# Patient Record
Sex: Female | Born: 1945 | Race: White | Hispanic: No | Marital: Married | Smoking: Former smoker
Health system: Southern US, Community
[De-identification: ages and names within clinical notes are randomized; demographics above are authoritative.]

## PROBLEM LIST (undated history)

## (undated) DIAGNOSIS — I10 Essential (primary) hypertension: Secondary | ICD-10-CM

## (undated) DIAGNOSIS — K449 Diaphragmatic hernia without obstruction or gangrene: Secondary | ICD-10-CM

## (undated) DIAGNOSIS — G4733 Obstructive sleep apnea (adult) (pediatric): Principal | ICD-10-CM

## (undated) DIAGNOSIS — M4800 Spinal stenosis, site unspecified: Secondary | ICD-10-CM

## (undated) DIAGNOSIS — Z8619 Personal history of other infectious and parasitic diseases: Secondary | ICD-10-CM

## (undated) DIAGNOSIS — F329 Major depressive disorder, single episode, unspecified: Secondary | ICD-10-CM

## (undated) DIAGNOSIS — F419 Anxiety disorder, unspecified: Secondary | ICD-10-CM

## (undated) DIAGNOSIS — M1990 Unspecified osteoarthritis, unspecified site: Secondary | ICD-10-CM

## (undated) DIAGNOSIS — K529 Noninfective gastroenteritis and colitis, unspecified: Secondary | ICD-10-CM

## (undated) DIAGNOSIS — K219 Gastro-esophageal reflux disease without esophagitis: Secondary | ICD-10-CM

## (undated) DIAGNOSIS — F32A Depression, unspecified: Secondary | ICD-10-CM

## (undated) DIAGNOSIS — G9340 Encephalopathy, unspecified: Secondary | ICD-10-CM

## (undated) DIAGNOSIS — N390 Urinary tract infection, site not specified: Secondary | ICD-10-CM

## (undated) DIAGNOSIS — R9439 Abnormal result of other cardiovascular function study: Secondary | ICD-10-CM

## (undated) DIAGNOSIS — A419 Sepsis, unspecified organism: Secondary | ICD-10-CM

## (undated) DIAGNOSIS — Z860100 Personal history of colon polyps, unspecified: Secondary | ICD-10-CM

## (undated) DIAGNOSIS — M797 Fibromyalgia: Secondary | ICD-10-CM

## (undated) DIAGNOSIS — R1900 Intra-abdominal and pelvic swelling, mass and lump, unspecified site: Secondary | ICD-10-CM

## (undated) DIAGNOSIS — E785 Hyperlipidemia, unspecified: Secondary | ICD-10-CM

## (undated) HISTORY — DX: Essential (primary) hypertension: I10

## (undated) HISTORY — DX: Personal history of colonic polyps: Z86.010

## (undated) HISTORY — DX: Hyperlipidemia, unspecified: E78.5

## (undated) HISTORY — DX: Major depressive disorder, single episode, unspecified: F32.9

## (undated) HISTORY — DX: Gastro-esophageal reflux disease without esophagitis: K21.9

## (undated) HISTORY — PX: RECTAL PROLAPSE REPAIR: SHX759

## (undated) HISTORY — DX: Encephalopathy, unspecified: G93.40

## (undated) HISTORY — DX: Noninfective gastroenteritis and colitis, unspecified: K52.9

## (undated) HISTORY — PX: BREAST BIOPSY: SHX20

## (undated) HISTORY — DX: Unspecified osteoarthritis, unspecified site: M19.90

## (undated) HISTORY — PX: ESOPHAGOGASTRODUODENOSCOPY: SHX1529

## (undated) HISTORY — DX: Intra-abdominal and pelvic swelling, mass and lump, unspecified site: R19.00

## (undated) HISTORY — DX: Personal history of colon polyps, unspecified: Z86.0100

## (undated) HISTORY — DX: Depression, unspecified: F32.A

## (undated) HISTORY — DX: Urinary tract infection, site not specified: N39.0

## (undated) HISTORY — DX: Diaphragmatic hernia without obstruction or gangrene: K44.9

## (undated) HISTORY — DX: Obstructive sleep apnea (adult) (pediatric): G47.33

## (undated) HISTORY — DX: Fibromyalgia: M79.7

## (undated) HISTORY — DX: Personal history of other infectious and parasitic diseases: Z86.19

## (undated) HISTORY — DX: Sepsis, unspecified organism: A41.9

## (undated) HISTORY — DX: Spinal stenosis, site unspecified: M48.00

## (undated) HISTORY — DX: Anxiety disorder, unspecified: F41.9

---

## 1981-02-10 HISTORY — PX: APPENDECTOMY: SHX54

## 1996-02-11 HISTORY — PX: POLYPECTOMY: SHX149

## 1997-06-13 ENCOUNTER — Ambulatory Visit (HOSPITAL_COMMUNITY): Admission: RE | Admit: 1997-06-13 | Discharge: 1997-06-13 | Payer: Self-pay | Admitting: Obstetrics and Gynecology

## 1998-06-27 ENCOUNTER — Ambulatory Visit (HOSPITAL_COMMUNITY): Admission: RE | Admit: 1998-06-27 | Discharge: 1998-06-27 | Payer: Self-pay | Admitting: Obstetrics and Gynecology

## 1998-07-13 ENCOUNTER — Encounter: Payer: Self-pay | Admitting: Gastroenterology

## 1998-07-13 ENCOUNTER — Ambulatory Visit (HOSPITAL_COMMUNITY): Admission: RE | Admit: 1998-07-13 | Discharge: 1998-07-13 | Payer: Self-pay | Admitting: Gastroenterology

## 1998-08-27 ENCOUNTER — Other Ambulatory Visit: Admission: RE | Admit: 1998-08-27 | Discharge: 1998-08-27 | Payer: Self-pay | Admitting: Gastroenterology

## 1998-10-25 ENCOUNTER — Encounter: Payer: Self-pay | Admitting: Gastroenterology

## 1998-10-25 ENCOUNTER — Ambulatory Visit (HOSPITAL_COMMUNITY): Admission: RE | Admit: 1998-10-25 | Discharge: 1998-10-25 | Payer: Self-pay | Admitting: Gastroenterology

## 1998-12-05 ENCOUNTER — Ambulatory Visit (HOSPITAL_COMMUNITY): Admission: RE | Admit: 1998-12-05 | Discharge: 1998-12-05 | Payer: Self-pay | Admitting: Obstetrics and Gynecology

## 1998-12-05 ENCOUNTER — Encounter: Payer: Self-pay | Admitting: Obstetrics and Gynecology

## 1999-01-25 ENCOUNTER — Encounter: Payer: Self-pay | Admitting: Obstetrics and Gynecology

## 1999-01-25 ENCOUNTER — Ambulatory Visit (HOSPITAL_COMMUNITY): Admission: RE | Admit: 1999-01-25 | Discharge: 1999-01-25 | Payer: Self-pay | Admitting: Obstetrics and Gynecology

## 1999-08-08 ENCOUNTER — Ambulatory Visit (HOSPITAL_COMMUNITY): Admission: RE | Admit: 1999-08-08 | Discharge: 1999-08-08 | Payer: Self-pay | Admitting: Obstetrics and Gynecology

## 1999-08-08 ENCOUNTER — Encounter: Payer: Self-pay | Admitting: Obstetrics and Gynecology

## 1999-08-19 ENCOUNTER — Encounter: Payer: Self-pay | Admitting: Obstetrics and Gynecology

## 1999-08-19 ENCOUNTER — Encounter: Admission: RE | Admit: 1999-08-19 | Discharge: 1999-08-19 | Payer: Self-pay | Admitting: Obstetrics and Gynecology

## 1999-10-23 ENCOUNTER — Other Ambulatory Visit: Admission: RE | Admit: 1999-10-23 | Discharge: 1999-10-23 | Payer: Self-pay | Admitting: Obstetrics and Gynecology

## 2000-02-27 ENCOUNTER — Other Ambulatory Visit: Admission: RE | Admit: 2000-02-27 | Discharge: 2000-02-27 | Payer: Self-pay | Admitting: Obstetrics and Gynecology

## 2001-06-21 ENCOUNTER — Ambulatory Visit (HOSPITAL_COMMUNITY): Admission: RE | Admit: 2001-06-21 | Discharge: 2001-06-21 | Payer: Self-pay | Admitting: Obstetrics and Gynecology

## 2001-06-21 ENCOUNTER — Encounter: Payer: Self-pay | Admitting: Obstetrics and Gynecology

## 2002-01-11 ENCOUNTER — Encounter: Admission: RE | Admit: 2002-01-11 | Discharge: 2002-03-15 | Payer: Self-pay | Admitting: Family Medicine

## 2002-06-10 ENCOUNTER — Encounter: Payer: Self-pay | Admitting: Radiology

## 2002-06-10 ENCOUNTER — Encounter: Admission: RE | Admit: 2002-06-10 | Discharge: 2002-06-10 | Payer: Self-pay | Admitting: Family Medicine

## 2002-06-10 ENCOUNTER — Encounter: Payer: Self-pay | Admitting: Family Medicine

## 2002-06-23 ENCOUNTER — Encounter: Payer: Self-pay | Admitting: Family Medicine

## 2002-06-23 ENCOUNTER — Encounter: Admission: RE | Admit: 2002-06-23 | Discharge: 2002-06-23 | Payer: Self-pay | Admitting: Family Medicine

## 2003-06-28 ENCOUNTER — Ambulatory Visit (HOSPITAL_COMMUNITY): Admission: RE | Admit: 2003-06-28 | Discharge: 2003-06-28 | Payer: Self-pay | Admitting: Internal Medicine

## 2004-02-11 HISTORY — PX: MEDIAL PARTIAL KNEE REPLACEMENT: SHX5965

## 2006-02-10 HISTORY — PX: OVARIAN CYST REMOVAL: SHX89

## 2007-02-11 LAB — HM COLONOSCOPY: HM Colonoscopy: NORMAL

## 2009-12-11 DIAGNOSIS — Z8619 Personal history of other infectious and parasitic diseases: Secondary | ICD-10-CM

## 2009-12-11 HISTORY — DX: Personal history of other infectious and parasitic diseases: Z86.19

## 2009-12-20 LAB — HM PAP SMEAR: HM Pap smear: NORMAL

## 2010-04-09 ENCOUNTER — Telehealth: Payer: Self-pay | Admitting: Internal Medicine

## 2010-04-09 NOTE — Telephone Encounter (Signed)
Pt has moved back in to town and is req to re-est with Dr Cato Mulligan as pcp. Pls advise. Pt does not have health insurance and will be self pay.

## 2010-04-09 NOTE — Telephone Encounter (Signed)
My apologies--- not accepting any new patients due to my schedule. We can get her in to see another physician

## 2010-04-10 NOTE — Telephone Encounter (Signed)
Lft pt vm stating that Dr Cato Mulligan declined her req to est with him as pcp and also told pt that we could offer her another pcp at [facility]. Waiting on call back.

## 2010-10-23 ENCOUNTER — Emergency Department (HOSPITAL_COMMUNITY)
Admission: EM | Admit: 2010-10-23 | Discharge: 2010-10-23 | Disposition: A | Discharge status: Home / Self Care | Payer: Self-pay | Attending: Emergency Medicine | Admitting: Emergency Medicine

## 2010-10-23 ENCOUNTER — Emergency Department (HOSPITAL_COMMUNITY): Payer: Self-pay

## 2010-10-23 DIAGNOSIS — IMO0002 Reserved for concepts with insufficient information to code with codable children: Secondary | ICD-10-CM | POA: Insufficient documentation

## 2010-10-23 DIAGNOSIS — Y92009 Unspecified place in unspecified non-institutional (private) residence as the place of occurrence of the external cause: Secondary | ICD-10-CM | POA: Insufficient documentation

## 2010-10-23 DIAGNOSIS — S43006A Unspecified dislocation of unspecified shoulder joint, initial encounter: Secondary | ICD-10-CM | POA: Insufficient documentation

## 2010-10-23 DIAGNOSIS — I10 Essential (primary) hypertension: Secondary | ICD-10-CM | POA: Insufficient documentation

## 2010-10-23 DIAGNOSIS — E119 Type 2 diabetes mellitus without complications: Secondary | ICD-10-CM | POA: Insufficient documentation

## 2010-11-27 ENCOUNTER — Ambulatory Visit (INDEPENDENT_AMBULATORY_CARE_PROVIDER_SITE_OTHER): Payer: Self-pay | Admitting: Family

## 2010-11-27 ENCOUNTER — Encounter: Payer: Self-pay | Admitting: Family

## 2010-11-27 VITALS — BP 140/78 | HR 48 | Temp 97.8°F | Resp 16

## 2010-11-27 DIAGNOSIS — E119 Type 2 diabetes mellitus without complications: Secondary | ICD-10-CM

## 2010-11-27 DIAGNOSIS — M797 Fibromyalgia: Secondary | ICD-10-CM | POA: Insufficient documentation

## 2010-11-27 DIAGNOSIS — K219 Gastro-esophageal reflux disease without esophagitis: Secondary | ICD-10-CM | POA: Insufficient documentation

## 2010-11-27 DIAGNOSIS — I493 Ventricular premature depolarization: Secondary | ICD-10-CM | POA: Insufficient documentation

## 2010-11-27 DIAGNOSIS — E785 Hyperlipidemia, unspecified: Secondary | ICD-10-CM

## 2010-11-27 DIAGNOSIS — G43909 Migraine, unspecified, not intractable, without status migrainosus: Secondary | ICD-10-CM | POA: Insufficient documentation

## 2010-11-27 DIAGNOSIS — F329 Major depressive disorder, single episode, unspecified: Secondary | ICD-10-CM

## 2010-11-27 DIAGNOSIS — E1165 Type 2 diabetes mellitus with hyperglycemia: Secondary | ICD-10-CM | POA: Insufficient documentation

## 2010-11-27 DIAGNOSIS — IMO0001 Reserved for inherently not codable concepts without codable children: Secondary | ICD-10-CM

## 2010-11-27 DIAGNOSIS — I10 Essential (primary) hypertension: Secondary | ICD-10-CM | POA: Insufficient documentation

## 2010-11-27 DIAGNOSIS — Z860100 Personal history of colon polyps, unspecified: Secondary | ICD-10-CM | POA: Insufficient documentation

## 2010-11-27 DIAGNOSIS — F32A Depression, unspecified: Secondary | ICD-10-CM

## 2010-11-27 DIAGNOSIS — I499 Cardiac arrhythmia, unspecified: Secondary | ICD-10-CM

## 2010-11-27 NOTE — Assessment & Plan Note (Signed)
Stable on Effexor, continue same.  

## 2010-11-27 NOTE — Assessment & Plan Note (Signed)
Reasonable BP control.

## 2010-11-27 NOTE — Patient Instructions (Signed)
Please follow up in 1 month for a Medicare Wellness Visit.

## 2010-11-27 NOTE — Progress Notes (Signed)
Subjective:    Patient ID: Kimberly Lin, female    DOB: April 01, 1945, 65 y.o.   MRN: [id number]  HPI  Kimberly Lin is a 65 yr old patient (used to see Dr. Cato Mulligan back in 2005), who presents today to restablish care.  She has been in [place] since that time and was receiving care under [facility]. She tells me that she was hospitalized last November with Urosepsis.  Arthritis- She reports that she used to be followed by pain management and on long acting narcotics, but she ultimately weaned herself off of the narcotics successfully.     HTN- takes lisinopril-HCTZ- she reports a bad cough.  Might be interested in switching medicines, but wants to wait until next month when she has her [employer].  Depression/anxiety- Reports that this is well controlled.  Uses xanax at bedtime to sleep.      Review of Systems  Constitutional: Negative for fever.       + weight gain  HENT: Negative for neck pain.   Eyes: Negative for visual disturbance.  Respiratory: Negative for chest tightness and shortness of breath.   Cardiovascular: Negative for chest pain.  Gastrointestinal: Negative for nausea and vomiting.  Genitourinary: Negative for urgency and frequency.  Musculoskeletal: Positive for arthralgias.  Skin: Negative for rash.  Neurological: Negative for headaches.  Hematological: Negative for adenopathy.  Psychiatric/Behavioral:       See HPI   Past Medical History  Diagnosis Date  . Arthritis     osteoarthritis  . Fibromyalgia   . History of chicken pox   . Depression   . Diabetes mellitus   . GERD (gastroesophageal reflux disease)   . Hyperlipidemia   . Hypertension   . Migraine   . Personal history of colonic polyps   . History of septic shock 12/2009    History   Social History  . Marital Status: Married    Spouse Name: N/A    Number of Children: 2  . Years of Education: N/A   Occupational History  . Not on file.   Social History Main Topics  .  Smoking status: Former Games developer  . Smokeless tobacco: Never Used   Comment: Smoked socially.  . Alcohol Use: 3.6 - 4.8 oz/week    6-8 Glasses of wine per week  . Drug Use: No  . Sexually Active: Not on file   Other Topics Concern  . Not on file   Social History Narrative   Regular exercise:  NoCaffeine use: 1 cup daily    Past Surgical History  Procedure Date  . Breast biopsy 2007, 2009    right breast  . Appendectomy 1983  . Joint replacement 2006    bilateral partial knee replacement  . Cystectomy 2008    Pt states she had a ?cyst removed ?ovaries    Family History  Problem Relation Age of Onset  . Cancer Mother     breast and lung  . Arthritis Mother   . Hyperlipidemia Mother   . Hypertension Mother   . Diabetes Mother   . Heart disease Mother   . Cancer Brother     lung, prostate  . Heart disease Brother     No Known Allergies  No current outpatient prescriptions on file prior to visit.    BP 140/78  Pulse 48  Temp(Src) 97.8 F (36.6 C) (Oral)  Resp 16       Objective:   Physical Exam  Constitutional: She appears well-developed and  well-nourished.  HENT:  Head: Normocephalic.  Cardiovascular: Normal rate.        Irregular rate/rythm  Pulmonary/Chest: Effort normal and breath sounds normal. No respiratory distress. She has no wheezes. She has no rales. She exhibits no tenderness.  Abdominal: Soft. Bowel sounds are normal.  Neurological: She is alert. Coordination normal.  Skin: Skin is warm and dry. No erythema.  Psychiatric: She has a normal mood and affect. Her behavior is normal. Judgment and thought content normal.          Assessment & Plan:  Pt wishes to defer further testing until her medicare becomes active in a few weeks.

## 2010-11-27 NOTE — Assessment & Plan Note (Signed)
EKG today notes NSR with frequent ectopic ventricular beats.  She is asymptomatic and tells me that this is not a new problem.

## 2010-11-29 ENCOUNTER — Telehealth: Payer: Self-pay | Admitting: *Deleted

## 2010-11-29 MED ORDER — LISINOPRIL-HYDROCHLOROTHIAZIDE 20-12.5 MG PO TABS: 2.0000 | ORAL_TABLET | Freq: Every day | ORAL | Status: DC

## 2010-11-29 NOTE — Telephone Encounter (Signed)
I sent refill on lisinopril-hct.  I would like to change her meds after she has insurance as the med I have in mind will be more expensive than what she is taking. We can discuss at her welcome to medicare visit.

## 2010-11-29 NOTE — Telephone Encounter (Signed)
Pt.notified

## 2010-11-29 NOTE — Telephone Encounter (Signed)
Received message from pt stating she does not have any refills on lisinopril-hct. Pt states she is okay with changing medication if you feel that is best. Currently without insurance and would like rx sent to [facility]. Please advise.

## 2010-12-13 ENCOUNTER — Ambulatory Visit (HOSPITAL_BASED_OUTPATIENT_CLINIC_OR_DEPARTMENT_OTHER)
Admission: RE | Admit: 2010-12-13 | Discharge: 2010-12-13 | Disposition: A | Discharge status: Home / Self Care | Payer: Medicare Other | Source: Ambulatory Visit | Attending: Family | Admitting: Family

## 2010-12-13 ENCOUNTER — Encounter: Payer: Self-pay | Admitting: Family

## 2010-12-13 ENCOUNTER — Other Ambulatory Visit: Payer: Self-pay | Admitting: Family

## 2010-12-13 ENCOUNTER — Ambulatory Visit (INDEPENDENT_AMBULATORY_CARE_PROVIDER_SITE_OTHER): Payer: Medicare Other | Admitting: Family

## 2010-12-13 ENCOUNTER — Telehealth: Payer: Self-pay | Admitting: *Deleted

## 2010-12-13 VITALS — BP 118/84 | HR 72 | Temp 97.8°F | Resp 16 | Ht 63.5 in | Wt 182.1 lb

## 2010-12-13 DIAGNOSIS — M25519 Pain in unspecified shoulder: Secondary | ICD-10-CM

## 2010-12-13 DIAGNOSIS — IMO0001 Reserved for inherently not codable concepts without codable children: Secondary | ICD-10-CM

## 2010-12-13 DIAGNOSIS — E785 Hyperlipidemia, unspecified: Secondary | ICD-10-CM

## 2010-12-13 DIAGNOSIS — Z1239 Encounter for other screening for malignant neoplasm of breast: Secondary | ICD-10-CM

## 2010-12-13 DIAGNOSIS — Z23 Encounter for immunization: Secondary | ICD-10-CM

## 2010-12-13 DIAGNOSIS — I499 Cardiac arrhythmia, unspecified: Secondary | ICD-10-CM

## 2010-12-13 DIAGNOSIS — Z860100 Personal history of colon polyps, unspecified: Secondary | ICD-10-CM

## 2010-12-13 DIAGNOSIS — Z1231 Encounter for screening mammogram for malignant neoplasm of breast: Secondary | ICD-10-CM | POA: Insufficient documentation

## 2010-12-13 DIAGNOSIS — I10 Essential (primary) hypertension: Secondary | ICD-10-CM

## 2010-12-13 DIAGNOSIS — M25511 Pain in right shoulder: Secondary | ICD-10-CM

## 2010-12-13 DIAGNOSIS — G43909 Migraine, unspecified, not intractable, without status migrainosus: Secondary | ICD-10-CM

## 2010-12-13 DIAGNOSIS — E119 Type 2 diabetes mellitus without complications: Secondary | ICD-10-CM

## 2010-12-13 DIAGNOSIS — Z13820 Encounter for screening for osteoporosis: Secondary | ICD-10-CM

## 2010-12-13 DIAGNOSIS — R002 Palpitations: Secondary | ICD-10-CM

## 2010-12-13 DIAGNOSIS — M797 Fibromyalgia: Secondary | ICD-10-CM

## 2010-12-13 LAB — HEPATIC FUNCTION PANEL
AST: 29 U/L (ref 0–37)
Bilirubin, Direct: 0.1 mg/dL (ref 0.0–0.3)
Indirect Bilirubin: 0.4 mg/dL (ref 0.0–0.9)
Total Bilirubin: 0.5 mg/dL (ref 0.3–1.2)

## 2010-12-13 LAB — TSH: TSH: 1.572 u[IU]/mL (ref 0.350–4.500)

## 2010-12-13 LAB — LIPID PANEL: Total CHOL/HDL Ratio: 6 Ratio

## 2010-12-13 LAB — BASIC METABOLIC PANEL
BUN: 24 mg/dL — ABNORMAL HIGH (ref 6–23)
CO2: 26 mEq/L (ref 19–32)
Calcium: 10.6 mg/dL — ABNORMAL HIGH (ref 8.4–10.5)
Creat: 1.21 mg/dL — ABNORMAL HIGH (ref 0.50–1.10)
Glucose, Bld: 109 mg/dL — ABNORMAL HIGH (ref 70–99)
Sodium: 133 mEq/L — ABNORMAL LOW (ref 135–145)

## 2010-12-13 MED ORDER — OLMESARTAN MEDOXOMIL 20 MG PO TABS: 20.0000 mg | ORAL_TABLET | Freq: Every day | ORAL | Status: DC

## 2010-12-13 MED ORDER — METFORMIN HCL 500 MG PO TABS: 1000.0000 mg | ORAL_TABLET | Freq: Two times a day (BID) | ORAL | Status: DC

## 2010-12-13 MED ORDER — DULOXETINE HCL 60 MG PO CPEP: 60.0000 mg | ORAL_CAPSULE | Freq: Every day | ORAL | Status: DC

## 2010-12-13 MED ORDER — METOPROLOL TARTRATE 50 MG PO TABS: 25.0000 mg | ORAL_TABLET | Freq: Two times a day (BID) | ORAL | Status: DC

## 2010-12-13 MED ORDER — PANTOPRAZOLE SODIUM 40 MG PO TBEC: 40.0000 mg | DELAYED_RELEASE_TABLET | Freq: Every day | ORAL | Status: DC

## 2010-12-13 NOTE — Assessment & Plan Note (Signed)
Pt tells me that GI recommended a 5 yr follow up colo in 2014.

## 2010-12-13 NOTE — Assessment & Plan Note (Signed)
Stable.  Monitor.  

## 2010-12-13 NOTE — Telephone Encounter (Signed)
Pt is requesting referral to cardiologist for her history of irregular heart rate. Pt states she has had some episodes since her last visit though HR is regular today. Please advise.

## 2010-12-13 NOTE — Assessment & Plan Note (Signed)
BP Readings from Last 3 Encounters:  12/13/10 118/84  11/27/10 140/78  BP looks good today.  But due to cough, will d/c lisinopril hctz and plan to place her on benicar instead. Continue beta blocker.

## 2010-12-13 NOTE — Telephone Encounter (Signed)
Left message on pt's voicemail and to call if any questions.

## 2010-12-13 NOTE — Telephone Encounter (Signed)
Lab order entered and forwarded to the lab.

## 2010-12-13 NOTE — Assessment & Plan Note (Signed)
Pt is requesting referral to cardiology for further evaluation as she reports increased palpitations recently.

## 2010-12-13 NOTE — Telephone Encounter (Signed)
Message copied by Kathi Simpers on Fri Dec 13, 2010  4:47 PM ------      Message from: O'SULLIVAN, [name]      Created: Fri Dec 13, 2010  1:49 PM       Could you pls ask lab to add on TSH to today's lab draw. Diagnosis is palpitations? thanks

## 2010-12-13 NOTE — Assessment & Plan Note (Signed)
Continue metformin, check a1c and urine microalbumin.  Change ACE to ARB due to cough.  Continue baby aspirin.

## 2010-12-13 NOTE — Assessment & Plan Note (Signed)
Will try changing effexor to cymbalta to see if this helps with her depression and fibromyalgia.

## 2010-12-13 NOTE — Patient Instructions (Addendum)
You will be contacted about your referral to Dr. Pearletha Forge- sports medicine about your shoulder.  Schedule your mammogram on the first floor. Schedule your bone density at the front desk. Stop lisinopril-hydrochlorothizide and effexor. Complete your lab work prior to leaving.  Follow up in 1 month.

## 2010-12-13 NOTE — Assessment & Plan Note (Signed)
Check FLP today. She reports hx of elevated LFT's in the past on a statin.

## 2010-12-13 NOTE — Telephone Encounter (Signed)
I will make arrangements.

## 2010-12-13 NOTE — Progress Notes (Signed)
Subjective:    Patient ID: Kimberly Lin, female    DOB: [birth year], 65 y.o.   MRN: [id number]  HPI  Subjective:   Patient here for Medicare annual wellness visit and management of other chronic and acute problems.  HTN-  She is currently on lisinopril-hydrochlorathiazide and lopressor.    Fibromyalgia- right dislocated shoulder is hurting her.  She has not seen orthopedics.    GERD- She tells me that she has been taking husband's protonix.  GERD has been well controlled.  Hyperlipidemia- not on medication. She reports that she had elevated LFT's previously on statin.   Migraine- controlled, denies recent migraine.    Colon Polyps-  Last colo was in 2009.  She had a polyp.  She was to told to repeat in 2014.  DM2- On metformin. She does not check her sugar at home.   Depression- "I am not happy."  She is upset re: housing arrangement.  She tells me that she has chronic issues of insomnia.  She has taken Ambien CR 12.5.  Some daytime sleepiness. Takes effexor.  S  Preventative-  Last pap smear was 11/11.  Her last mammogram was 11/11- Pneumovax last November.  Due for flu.  She would like to receive the zostavax.  She is due for bone density.   Risk factors:  At risk for heart disease due to diabetes.   Roster of Physicians Providing Medical Care to Patient: None  Activities of Daily Living  In your present state of health, do you have any difficulty performing the following activities? Preparing food and eating?: No  Bathing yourself: No  Getting dressed: No  Using the toilet:No  Moving around from place to place: No  In the past year have you fallen or had a near fall?: She did have a fall in the middle of the night- accidental fall.   Home Safety: Has smoke detector and wears seat belts. No firearms. No excess sun exposure.  Diet and Exercise  Current exercise habits: walks 3 times a week for 10-15 minutes.   Dietary issues discussed: healthy diet   Depression  Screen  (Note: if answer to either of the following is "Yes", then a more complete depression screening is indicated)  Q1: Over the past two weeks, have you felt down, depressed or hopeless?no  Q2: Over the past two weeks, have you felt little interest or pleasure in doing things? no   The following portions of the patient's history were reviewed and updated as appropriate: allergies, current medications, past family history, past medical history, past social history, past surgical history and problem list.   Objective:   Vision:see nursing Hearing: pt is able to hear a forced whisper at 6 feet Body mass index:see nursing Cognitive Impairment Assessment: cognition, memory and judgment appear normal.   Assessment:    Pt is due for flu shot and zostavax (given today) Also due for mammogram and dexa which have been ordered today. Plan pap smear next year.  Colo in 2014.   Plan:    During the course of the visit the patient was educated and counseled about appropriate screening and preventive services including:       Fall prevention   Screening mammography  Bone densitometry screening    Vaccines / LABS Zostavax / flu Vaccine  today  Patient Instructions (the written plan) was given to the patient.     Review of Systems  Constitutional: Negative for fever.  HENT: Negative for hearing loss.  Respiratory: Positive for cough. Negative for shortness of breath.   Cardiovascular: Negative for chest pain and leg swelling.  Gastrointestinal: Negative for nausea and blood in stool.  Genitourinary: Negative for flank pain.  Musculoskeletal: Positive for back pain.  Neurological: Negative for weakness.  Psychiatric/Behavioral:       See HPI       Objective:   Physical Exam  Constitutional: She appears well-developed and well-nourished.  HENT:  Head: Normocephalic and atraumatic.  Right Ear: Tympanic membrane and ear canal normal.  Left Ear: Tympanic membrane and ear canal  normal.  Mouth/Throat: Uvula is midline and mucous membranes are normal.  Eyes: Conjunctivae are normal. Pupils are equal, round, and reactive to light.  Neck: Normal range of motion. Neck supple. No thyromegaly present.  Cardiovascular: Normal rate and regular rhythm.   No murmur heard. Pulmonary/Chest: Effort normal and breath sounds normal. No respiratory distress. She has no wheezes. She has no rales. She exhibits no tenderness.  Abdominal: Soft. Bowel sounds are normal. She exhibits no distension and no mass. There is no tenderness. There is no rebound and no guarding.  Genitourinary:       Bilateral Breast exam is normal without palpable masses or thickening. No Axillary LAD is noted.   Musculoskeletal: Normal range of motion. She exhibits no edema.  Neurological: She is alert. No cranial nerve deficit.  Skin: Skin is warm and dry. No rash noted. No erythema. No pallor.  Psychiatric: She has a normal mood and affect. Her behavior is normal. Judgment and thought content normal.          Assessment & Plan:

## 2010-12-16 ENCOUNTER — Ambulatory Visit (INDEPENDENT_AMBULATORY_CARE_PROVIDER_SITE_OTHER)
Admission: RE | Admit: 2010-12-16 | Discharge: 2010-12-16 | Disposition: A | Discharge status: Home / Self Care | Payer: Medicare Other | Source: Ambulatory Visit

## 2010-12-16 DIAGNOSIS — Z13820 Encounter for screening for osteoporosis: Secondary | ICD-10-CM

## 2010-12-17 ENCOUNTER — Ambulatory Visit (INDEPENDENT_AMBULATORY_CARE_PROVIDER_SITE_OTHER): Payer: Medicare Other | Admitting: Family Medicine

## 2010-12-17 ENCOUNTER — Ambulatory Visit (HOSPITAL_BASED_OUTPATIENT_CLINIC_OR_DEPARTMENT_OTHER)
Admission: RE | Admit: 2010-12-17 | Discharge: 2010-12-17 | Disposition: A | Discharge status: Home / Self Care | Payer: Medicare Other | Source: Ambulatory Visit | Attending: Family Medicine | Admitting: Family Medicine

## 2010-12-17 ENCOUNTER — Encounter: Payer: Self-pay | Admitting: Family Medicine

## 2010-12-17 ENCOUNTER — Telehealth: Payer: Self-pay | Admitting: Family

## 2010-12-17 VITALS — BP 166/95 | HR 74 | Temp 98.1°F | Ht 64.0 in | Wt 182.0 lb

## 2010-12-17 DIAGNOSIS — M25511 Pain in right shoulder: Secondary | ICD-10-CM

## 2010-12-17 DIAGNOSIS — M25519 Pain in unspecified shoulder: Secondary | ICD-10-CM

## 2010-12-17 MED ORDER — SIMVASTATIN 20 MG PO TABS: 20.0000 mg | ORAL_TABLET | Freq: Every day | ORAL | Status: DC

## 2010-12-17 MED ORDER — MELOXICAM 15 MG PO TABS: 15.0000 mg | ORAL_TABLET | Freq: Every day | ORAL | Status: DC

## 2010-12-17 MED ORDER — OXYCODONE-ACETAMINOPHEN 5-325 MG PO TABS: 1.0000 | ORAL_TABLET | Freq: Four times a day (QID) | ORAL | Status: DC | PRN

## 2010-12-17 NOTE — Assessment & Plan Note (Signed)
Right shoulder dislocation - added axillary radiographs which do not show a bony bankart.  Ultrasound reassuring as well as exam that she does not have a full thickness rotator cuff tear that would necessitate surgical intervention.  Will proceed with conservative care - start physical therapy, meloxicam, with percocet as needed for severe pain.  F/u in 6 weeks (4 weeks if not improving) - if not improving would consider MR arthrogram to assess for labral tear.  See instructions for further.

## 2010-12-17 NOTE — Progress Notes (Signed)
Subjective:    Patient ID: Kimberly Lin, female    DOB: [birth year], 65 y.o.   MRN: [id number]  PCP: Sandford Craze  HPI 65 yo F here for right shoulder pain.  Patient's issues with right shoulder started on 10/23/2010 when she got up in the middle of the night, slipped and fell forward hitting right shoulder on a chair - caused this to dislocate. Waited 3 hours to present to ED - had conscious sedation then reduced her shoulder dislocation. No evidence of bony bankart or hill sachs on radiographs performed (did not have axillary view done). She has improved but still has 2/10 pain at rest, cannot sleep on her right side as it will wake her up. Motion has returned but has pain especially with reaching and overhead activities. Is right handed. No numbness into arm. Has not tried PT for this. Was given oxycodone from ED back in September when this happened but not taking anything now for pain.  Past Medical History  Diagnosis Date  . Arthritis     osteoarthritis  . Fibromyalgia   . History of chicken pox   . Depression   . Diabetes mellitus   . GERD (gastroesophageal reflux disease)   . Hyperlipidemia   . Hypertension   . Migraine   . Personal history of colonic polyps   . History of septic shock 12/2009  . Spinal stenosis     Current Outpatient Prescriptions on File Prior to Visit  Medication Sig Dispense Refill  . ALPRAZolam (XANAX) 1 MG tablet Take 2 tablets by mouth as needed as needed.      Marland Kitchen aspirin 81 MG tablet Take 81 mg by mouth daily.        . Calcium Carbonate-Vitamin D (CALCIUM-D) 600-400 MG-UNIT TABS Take 1 tablet by mouth 2 (two) times daily.        . Cholecalciferol (D-3-5) 5000 UNITS capsule Take 5,000 Units by mouth daily.        Marland Kitchen co-enzyme Q-10 30 MG capsule Take 100 mg by mouth daily.        . DULoxetine (CYMBALTA) 60 MG capsule Take 1 capsule (60 mg total) by mouth daily.  30 capsule  2  . metFORMIN (GLUCOPHAGE) 500 MG tablet Take 2 tablets (1,000  mg total) by mouth 2 (two) times daily with a meal. Take 2 tablets by mouth twice a day.  120 tablet  3  . metoprolol (LOPRESSOR) 50 MG tablet Take 0.5 tablets (25 mg total) by mouth 2 (two) times daily. Take 1/2 tablet twice a day.  30 tablet  3  . Multiple Vitamin (MULTIVITAMIN) tablet Take 1 tablet by mouth daily.        Marland Kitchen olmesartan (BENICAR) 20 MG tablet Take 1 tablet (20 mg total) by mouth daily.  30 tablet  2  . pantoprazole (PROTONIX) 40 MG tablet Take 1 tablet (40 mg total) by mouth daily.  30 tablet  5    Past Surgical History  Procedure Date  . Breast biopsy 2007, 2009    right breast  . Appendectomy 1983  . Joint replacement 2006    bilateral partial knee replacement  . Cystectomy 2008    Pt states she had a ?cyst removed ?ovaries    No Known Allergies  History   Social History  . Marital Status: Married    Spouse Name: N/A    Number of Children: 2  . Years of Education: N/A   Occupational History  . Not on file.  Social History Main Topics  . Smoking status: Former Games developer  . Smokeless tobacco: Never Used   Comment: Smoked socially.  . Alcohol Use: 3.6 - 4.8 oz/week    6-8 Glasses of wine per week  . Drug Use: No  . Sexually Active: Not on file   Other Topics Concern  . Not on file   Social History Narrative   Regular exercise:  NoCaffeine use: 1 cup daily    Family History  Problem Relation Age of Onset  . Cancer Mother     breast and lung  . Arthritis Mother   . Hyperlipidemia Mother   . Hypertension Mother   . Diabetes Mother   . Heart disease Mother   . Cancer Brother     lung, prostate  . Heart disease Brother   . Hypertension Brother   . Hyperlipidemia Brother   . Diabetes Brother   . Sudden death Father   . Diabetes Sister   . Hyperlipidemia Sister   . Hypertension Sister   . Diabetes Maternal Aunt   . Heart attack Maternal Aunt   . Hyperlipidemia Maternal Aunt   . Hypertension Maternal Aunt   . Hypertension Maternal Uncle   .  Hyperlipidemia Maternal Uncle   . Heart attack Maternal Uncle   . Diabetes Maternal Uncle   . Diabetes Paternal Aunt   . Heart attack Paternal Aunt   . Hyperlipidemia Paternal Aunt   . Hypertension Paternal Aunt   . Hypertension Paternal Uncle   . Hyperlipidemia Paternal Uncle   . Heart attack Paternal Uncle   . Diabetes Paternal Uncle     BP 166/95  Pulse 74  Temp(Src) 98.1 F (36.7 C) (Oral)  Ht 5\' 4"  (1.626 m)  Wt 182 lb (82.555 kg)  BMI 31.24 kg/m2  Review of Systems See HPI above.    Objective:   Physical Exam Gen: NAD  R shoulder: No swelling, ecchymoses.  No gross deformity. Mild TTP at biceps tendon, AC joint. FROM with + painful arc.  No frozen shoulder. + hawkins and neers. Negative Speeds, Yergasons. Strength 4+/5 with Empty can and painful.  Strength 5/5 with resisted internal/external rotation, mild pain with resisted IR. Negative apprehension though with pain. O'briens causes pain with thumb up and down - equal with both. NV intact distally.  MSK u/s R shoulder: Biceps tendon intact on long and transverse views.  AC joint with moderate DJD.  Subscapularis appears normal without impingement.  Supraspinatus also intact on long and transverse views without partial tears or retraction - small calcifications in tendon and at insertion.    Assessment & Plan:  1. Right shoulder dislocation - added axillary radiographs which do not show a bony bankart.  Ultrasound reassuring as well as exam that she does not have a full thickness rotator cuff tear that would necessitate surgical intervention.  Will proceed with conservative care - start physical therapy, meloxicam, with percocet as needed for severe pain.  F/u in 6 weeks (4 weeks if not improving) - if not improving would consider MR arthrogram to assess for labral tear.  See instructions for further.

## 2010-12-17 NOTE — Telephone Encounter (Signed)
Please call pt and let her know that her calcium is high.  I would like for her to cut back her calcium supplement to once daily.  Cholesterol is high.  I would like her to add livalo  once daily (this is a new statin which is well tolerated) We will keep a close eye on her liver function.  She should keep her follow up apt in December. Please leave her samples if we have them.

## 2010-12-17 NOTE — Patient Instructions (Signed)
We will move forward with physical therapy for the next 6 weeks to work on decreasing your pain and increasing strength in your right shoulder. If over the next 4 weeks you are not improving, follow up with me for a recheck - we would proceed with MRI arthrogram as a next step to assess for labral and rotator cuff tears. A cortisone injection may be beneficial depending on what the MRI shows. Icing 15 minutes at a time 3-4 times a day (or heat if this helps more with your pain). Meloxicam 15mg  daily with food for pain and inflammation. Try to avoid painful activities (overhead activities, lifting with extended arm) as much as possible. Follow up with me in 6 weeks for a recheck.

## 2010-12-18 ENCOUNTER — Telehealth: Payer: Self-pay | Admitting: Family

## 2010-12-18 ENCOUNTER — Encounter: Payer: Self-pay | Admitting: Family

## 2010-12-18 ENCOUNTER — Telehealth: Payer: Self-pay | Admitting: *Deleted

## 2010-12-18 ENCOUNTER — Ambulatory Visit (INDEPENDENT_AMBULATORY_CARE_PROVIDER_SITE_OTHER): Payer: Medicare Other | Admitting: Family

## 2010-12-18 DIAGNOSIS — I10 Essential (primary) hypertension: Secondary | ICD-10-CM

## 2010-12-18 DIAGNOSIS — R42 Dizziness and giddiness: Secondary | ICD-10-CM

## 2010-12-18 MED ORDER — LOSARTAN POTASSIUM 25 MG PO TABS: 25.0000 mg | ORAL_TABLET | Freq: Every day | ORAL | Status: DC

## 2010-12-18 NOTE — Assessment & Plan Note (Signed)
?   Due to benicar, ? Due to sinus congestion.  Trial of claritin. Will see how she does with switch to losartan.

## 2010-12-18 NOTE — Patient Instructions (Addendum)
Start claritin.  Call if your dizziness worsens or if it does not improve.   Follow up in December as scheduled.

## 2010-12-18 NOTE — Progress Notes (Signed)
Subjective:    Patient ID: Kimberly Lin, female    DOB: 03-28-1945, 65 y.o.   MRN: [id number]  HPI  Kimberly Lin is a 65 yr old female who presents today to discuss her blood pressure.  She reports elevated BP's at home as above. Last visit she was switch to benicar from her lisinopril due to ace cough.  Notes that she has been feeling dizzy.  Happens any time during the day.   Has "eased up a little bit."  Cough is still present but improved.  She is schedule to see cardiology re: hx of irregular HR. Repeat BP today in the office is 132/82  LE edema- worse during the day.  This AM her toes felt swollen, but have decreased during the day.    Hyperlipidemia- to start livalo.    Review of Systems See HPI  Past Medical History  Diagnosis Date  . Arthritis     osteoarthritis  . Fibromyalgia   . History of chicken pox   . Depression   . Diabetes mellitus   . GERD (gastroesophageal reflux disease)   . Hyperlipidemia   . Hypertension   . Migraine   . Personal history of colonic polyps   . History of septic shock 12/2009  . Spinal stenosis     History   Social History  . Marital Status: Married    Spouse Name: N/A    Number of Children: 2  . Years of Education: N/A   Occupational History  . Not on file.   Social History Main Topics  . Smoking status: Former Games developer  . Smokeless tobacco: Never Used   Comment: Smoked socially.  . Alcohol Use: 3.6 - 4.8 oz/week    6-8 Glasses of wine per week  . Drug Use: No  . Sexually Active: Not on file   Other Topics Concern  . Not on file   Social History Narrative   Regular exercise:  NoCaffeine use: 1 cup daily    Past Surgical History  Procedure Date  . Breast biopsy 2007, 2009    right breast  . Appendectomy 1983  . Joint replacement 2006    bilateral partial knee replacement  . Cystectomy 2008    Pt states she had a ?cyst removed ?ovaries    Family History  Problem Relation Age of Onset  . Cancer Mother    breast and lung  . Arthritis Mother   . Hyperlipidemia Mother   . Hypertension Mother   . Diabetes Mother   . Heart disease Mother   . Cancer Brother     lung, prostate  . Heart disease Brother   . Hypertension Brother   . Hyperlipidemia Brother   . Diabetes Brother   . Sudden death Father   . Diabetes Sister   . Hyperlipidemia Sister   . Hypertension Sister   . Diabetes Maternal Aunt   . Heart attack Maternal Aunt   . Hyperlipidemia Maternal Aunt   . Hypertension Maternal Aunt   . Hypertension Maternal Uncle   . Hyperlipidemia Maternal Uncle   . Heart attack Maternal Uncle   . Diabetes Maternal Uncle   . Diabetes Paternal Aunt   . Heart attack Paternal Aunt   . Hyperlipidemia Paternal Aunt   . Hypertension Paternal Aunt   . Hypertension Paternal Uncle   . Hyperlipidemia Paternal Uncle   . Heart attack Paternal Uncle   . Diabetes Paternal Uncle     No Known Allergies  Current Outpatient  Prescriptions on File Prior to Visit  Medication Sig Dispense Refill  . ALPRAZolam (XANAX) 1 MG tablet Take 2 tablets by mouth as needed as needed.      Marland Kitchen aspirin 81 MG tablet Take 81 mg by mouth daily.        . Calcium Carbonate-Vitamin D (CALCIUM-D) 600-400 MG-UNIT TABS Take 1 tablet by mouth daily.       . Cholecalciferol (D-3-5) 5000 UNITS capsule Take 5,000 Units by mouth daily.        Marland Kitchen co-enzyme Q-10 30 MG capsule Take 100 mg by mouth daily.        . metFORMIN (GLUCOPHAGE) 500 MG tablet Take 2 tablets (1,000 mg total) by mouth 2 (two) times daily with a meal. Take 2 tablets by mouth twice a day.  120 tablet  3  . metoprolol (LOPRESSOR) 50 MG tablet Take 0.5 tablets (25 mg total) by mouth 2 (two) times daily. Take 1/2 tablet twice a day.  30 tablet  3  . Multiple Vitamin (MULTIVITAMIN) tablet Take 1 tablet by mouth daily.        . pantoprazole (PROTONIX) 40 MG tablet Take 1 tablet (40 mg total) by mouth daily.  30 tablet  5  . DULoxetine (CYMBALTA) 60 MG capsule Take 1 capsule (60  mg total) by mouth daily.  30 capsule  2  . meloxicam (MOBIC) 15 MG tablet Take 1 tablet (15 mg total) by mouth daily. With food.  30 tablet  1  . oxyCODONE-acetaminophen (ROXICET) 5-325 MG per tablet Take 1 tablet by mouth every 6 (six) hours as needed for pain.  60 tablet  0  . Pitavastatin Calcium (LIVALO) 1 MG TABS Take 1 tablet by mouth daily.          BP 110/78  Pulse 78  Temp(Src) 98 F (36.7 C) (Oral)  Resp 16  Ht 5' 3.5" (1.613 m)  Wt 186 lb 0.6 oz (84.387 kg)  BMI 32.44 kg/m2       Objective:   Physical Exam  Constitutional: She appears well-developed and well-nourished.  Cardiovascular: Normal rate and regular rhythm.   No murmur heard. Pulmonary/Chest: Effort normal and breath sounds normal. No respiratory distress. She has no wheezes. She has no rales.  Musculoskeletal: She exhibits no edema.  Psychiatric: She has a normal mood and affect. Her behavior is normal. Judgment and thought content normal.          Assessment & Plan:

## 2010-12-18 NOTE — Telephone Encounter (Signed)
Received fax from [facility] that Benicar is a non formulary medication (will only provide one time fill)  and request change to generic Cozaar (Losartan). Please advise.

## 2010-12-18 NOTE — Telephone Encounter (Signed)
Cancelled rx for simvastatin at pharmacy.

## 2010-12-18 NOTE — Assessment & Plan Note (Signed)
BP today looks great, ? Accuracy of home monitor.  Her insurance will only cover a 30 day supply of benicar.  They are recommending losartan as an alternative. Will switch.  She will follow up in 1 month- will need bmet at that time.

## 2010-12-18 NOTE — Telephone Encounter (Signed)
Notified pt, she states she will try the cholesterol samples but is wary as they have caused elevation of liver enzymes in the past. Will give pt samples of Livalo 2mg  (take 1/2 tablet daily) to try first. Pt states she has not started Cymbalta yet as she still has several Effexor that she wants to complete first.  Pt states she started Benicar on Saturday and still has cough. Also has had elevated BP 160-170s / 90-100s. Has had dizziness and feet swelling x 3 days. Advised pt of need to be seen in the office. Scheduled appt today for 3:15pm.

## 2010-12-20 ENCOUNTER — Telehealth: Payer: Self-pay | Admitting: Family

## 2010-12-20 NOTE — Telephone Encounter (Signed)
Left message for pt to return my call.  When she calls back pls let her know that that the radiologist wants to do some additional imaging of the left breast.  They should be contacting her about setting up this testing if they have not already.

## 2010-12-23 ENCOUNTER — Ambulatory Visit: Payer: Medicare Other | Attending: Family Medicine | Admitting: Physical Therapy

## 2010-12-23 ENCOUNTER — Other Ambulatory Visit: Payer: Self-pay | Admitting: Family

## 2010-12-23 DIAGNOSIS — R928 Other abnormal and inconclusive findings on diagnostic imaging of breast: Secondary | ICD-10-CM

## 2010-12-23 DIAGNOSIS — M25619 Stiffness of unspecified shoulder, not elsewhere classified: Secondary | ICD-10-CM | POA: Insufficient documentation

## 2010-12-23 DIAGNOSIS — IMO0001 Reserved for inherently not codable concepts without codable children: Secondary | ICD-10-CM | POA: Insufficient documentation

## 2010-12-23 DIAGNOSIS — M25519 Pain in unspecified shoulder: Secondary | ICD-10-CM | POA: Insufficient documentation

## 2010-12-23 NOTE — Telephone Encounter (Signed)
Pt notified and provided with [facility] to [facility] of the left breast.

## 2010-12-24 ENCOUNTER — Encounter: Payer: Self-pay | Admitting: Family

## 2010-12-25 ENCOUNTER — Ambulatory Visit: Payer: Medicare Other | Admitting: Physical Therapy

## 2010-12-25 ENCOUNTER — Ambulatory Visit (INDEPENDENT_AMBULATORY_CARE_PROVIDER_SITE_OTHER): Payer: Medicare Other | Admitting: Cardiovascular Disease

## 2010-12-25 ENCOUNTER — Encounter: Payer: Self-pay | Admitting: Cardiovascular Disease

## 2010-12-25 DIAGNOSIS — M797 Fibromyalgia: Secondary | ICD-10-CM

## 2010-12-25 DIAGNOSIS — E785 Hyperlipidemia, unspecified: Secondary | ICD-10-CM

## 2010-12-25 DIAGNOSIS — I4949 Other premature depolarization: Secondary | ICD-10-CM

## 2010-12-25 DIAGNOSIS — IMO0001 Reserved for inherently not codable concepts without codable children: Secondary | ICD-10-CM

## 2010-12-25 DIAGNOSIS — I493 Ventricular premature depolarization: Secondary | ICD-10-CM

## 2010-12-25 DIAGNOSIS — I10 Essential (primary) hypertension: Secondary | ICD-10-CM

## 2010-12-25 NOTE — Assessment & Plan Note (Signed)
Should she admits to not sleeping very well. She also may have sleep apnea. I think that she needs to have a formal sleep study.  I've asked her to exercise on a regular basis. I prefer that he exercise for one hour a day. She can start up by exercising a few minutes several times a day but I would like for her to build up to a continuous 1 hour of exercise. This will also help with her hypertriglyceridemia.

## 2010-12-25 NOTE — Assessment & Plan Note (Signed)
We'll have her continue with a good diet and exercise program. I've asked her to stay away from eating anything white, weak, or sleep. I told her triglyceride levels will improve dramatically if she lost some weight. I'll see her again on as-needed basis.

## 2010-12-25 NOTE — Patient Instructions (Addendum)
Your physician recommends that you schedule a follow-up appointment in: As needed.  Dr. Elease Hashimoto would like for you to begin a low carbohydrate diet and exercise regimen that includes exercise such as walking and avoiding foods that are white, wheat or sweet.

## 2010-12-25 NOTE — Assessment & Plan Note (Signed)
Her blood pressure is actually fairly well controlled. I think that she may have sleep apnea. She certainly does not sleep well at night. We talked about getting on a good diet and exercise program. All these issues will definitely help her blood pressure control. She'll continue with her current medications.

## 2010-12-25 NOTE — Assessment & Plan Note (Signed)
She has benign PVCs on ECG.  She is asymptomatic.   No further treatment needed.

## 2010-12-25 NOTE — Progress Notes (Signed)
Kimberly Lin Date of Birth  09/08/1945 [facility] [address]    [address] [place], [place]  [address] [phone]  Fax  [phone]  History of Present Illness:  Kimberly Lin is a 65 y.o. famale with a hx of hypertension, diabetes melliutus,  and hypercholesterolemia. She was recently seen for a general medical exam and was found to have premature ventricular contractions. We are asked to see her today for further evaluation of her PVCs.  She is basically asymptomatic. She cannot really even tell that she's having premature ventricular contractions. She denies any chest pain, shortness breath, syncope, or presyncope.  She was  also found to have a very high triglyceride level and high cholesterol level at her most recent physical.  Current Outpatient Prescriptions on File Prior to Visit  Medication Sig Dispense Refill  . ALPRAZolam (XANAX) 1 MG tablet Take 2 tablets by mouth as needed as needed.      Marland Kitchen aspirin 81 MG tablet Take 81 mg by mouth daily.        . Calcium Carbonate-Vitamin D (CALCIUM-D) 600-400 MG-UNIT TABS Take 1 tablet by mouth daily.       . Cholecalciferol (D-3-5) 5000 UNITS capsule Take 5,000 Units by mouth daily.        Marland Kitchen co-enzyme Q-10 30 MG capsule Take 100 mg by mouth daily.        . DULoxetine (CYMBALTA) 60 MG capsule Take 1 capsule (60 mg total) by mouth daily.  30 capsule  2  . loratadine (CLARITIN) 10 MG tablet Take 10 mg by mouth daily.        Marland Kitchen losartan (COZAAR) 25 MG tablet Take 1 tablet (25 mg total) by mouth daily.  30 tablet  1  . meloxicam (MOBIC) 15 MG tablet Take 1 tablet (15 mg total) by mouth daily. With food.  30 tablet  1  . metFORMIN (GLUCOPHAGE) 500 MG tablet Take 2 tablets (1,000 mg total) by mouth 2 (two) times daily with a meal. Take 2 tablets by mouth twice a day.  120 tablet  3  . metoprolol (LOPRESSOR) 50 MG tablet Take 0.5 tablets (25 mg total) by mouth 2 (two) times daily. Take 1/2 tablet twice a day.  30 tablet  3  . Multiple  Vitamin (MULTIVITAMIN) tablet Take 1 tablet by mouth daily.        Marland Kitchen oxyCODONE-acetaminophen (ROXICET) 5-325 MG per tablet Take 1 tablet by mouth every 6 (six) hours as needed for pain.  60 tablet  0  . pantoprazole (PROTONIX) 40 MG tablet Take 1 tablet (40 mg total) by mouth daily.  30 tablet  5  . Pitavastatin Calcium (LIVALO) 1 MG TABS Take 1 tablet by mouth daily.          No Known Allergies  Past Medical History  Diagnosis Date  . Arthritis     osteoarthritis  . Fibromyalgia   . History of chicken pox   . Depression   . Diabetes mellitus   . GERD (gastroesophageal reflux disease)   . Hyperlipidemia   . Hypertension   . Migraine   . Personal history of colonic polyps   . History of septic shock 12/2009  . Spinal stenosis     Past Surgical History  Procedure Date  . Breast biopsy 2007, 2009    right breast  . Appendectomy 1983  . Joint replacement 2006    bilateral partial knee replacement  . Cystectomy 2008    Pt states she had  a ?cyst removed ?ovaries    History  Smoking status  . Former Smoker  Smokeless tobacco  . Never Used  Comment: Smoked socially.    History  Alcohol Use  . 3.6 - 4.8 oz/week  . 6-8 Glasses of wine per week    Family History  Problem Relation Age of Onset  . Cancer Mother     breast and lung  . Arthritis Mother   . Hyperlipidemia Mother   . Hypertension Mother   . Diabetes Mother   . Heart disease Mother   . Cancer Brother     lung, prostate  . Heart disease Brother   . Hypertension Brother   . Hyperlipidemia Brother   . Diabetes Brother   . Sudden death Father   . Diabetes Sister   . Hyperlipidemia Sister   . Hypertension Sister   . Diabetes Maternal Aunt   . Heart attack Maternal Aunt   . Hyperlipidemia Maternal Aunt   . Hypertension Maternal Aunt   . Hypertension Maternal Uncle   . Hyperlipidemia Maternal Uncle   . Heart attack Maternal Uncle   . Diabetes Maternal Uncle   . Diabetes Paternal Aunt   . Heart  attack Paternal Aunt   . Hyperlipidemia Paternal Aunt   . Hypertension Paternal Aunt   . Hypertension Paternal Uncle   . Hyperlipidemia Paternal Uncle   . Heart attack Paternal Uncle   . Diabetes Paternal Uncle     Reviw of Systems:  Reviewed in the HPI.  All other systems are negative.  Physical Exam: BP 143/84  Pulse 79  Ht 5' 3.5" (1.613 m)  Wt 188 lb 12.8 oz (85.639 kg)  BMI 32.92 kg/m2 The patient is alert and oriented x 3.  The mood and affect are normal.   Skin: warm and dry.  Color is normal.    HEENT:   [place]/AT normal carotids, no JVD  Lungs: clear   Heart: RR    Abdomen: + BS, normal   Extremities:  No c/c/e  Neuro:  Non focal    ECG: NSR, frequent PVCs.  Assessment / Plan:

## 2010-12-30 ENCOUNTER — Ambulatory Visit: Payer: Medicare Other | Admitting: Physical Therapy

## 2011-01-01 ENCOUNTER — Ambulatory Visit: Payer: Medicare Other | Admitting: Physical Therapy

## 2011-01-06 ENCOUNTER — Ambulatory Visit: Payer: Medicare Other | Admitting: Physical Therapy

## 2011-01-07 ENCOUNTER — Other Ambulatory Visit: Payer: Medicare Other

## 2011-01-10 ENCOUNTER — Ambulatory Visit: Payer: Medicare Other | Admitting: Physical Therapy

## 2011-01-14 ENCOUNTER — Ambulatory Visit: Payer: Medicare Other | Attending: Family Medicine | Admitting: Physical Therapy

## 2011-01-14 DIAGNOSIS — IMO0001 Reserved for inherently not codable concepts without codable children: Secondary | ICD-10-CM | POA: Insufficient documentation

## 2011-01-14 DIAGNOSIS — M25619 Stiffness of unspecified shoulder, not elsewhere classified: Secondary | ICD-10-CM | POA: Insufficient documentation

## 2011-01-14 DIAGNOSIS — M25519 Pain in unspecified shoulder: Secondary | ICD-10-CM | POA: Insufficient documentation

## 2011-01-15 ENCOUNTER — Encounter: Payer: Self-pay | Admitting: Family

## 2011-01-15 ENCOUNTER — Ambulatory Visit (INDEPENDENT_AMBULATORY_CARE_PROVIDER_SITE_OTHER): Payer: Medicare Other | Admitting: Family

## 2011-01-15 ENCOUNTER — Ambulatory Visit
Admission: RE | Admit: 2011-01-15 | Discharge: 2011-01-15 | Disposition: A | Discharge status: Home / Self Care | Payer: Medicare Other | Source: Ambulatory Visit | Attending: Family | Admitting: Family

## 2011-01-15 ENCOUNTER — Telehealth: Payer: Self-pay | Admitting: Family

## 2011-01-15 VITALS — BP 132/84 | HR 72 | Temp 98.1°F | Resp 16 | Ht 63.5 in | Wt 183.1 lb

## 2011-01-15 DIAGNOSIS — I4949 Other premature depolarization: Secondary | ICD-10-CM

## 2011-01-15 DIAGNOSIS — G4700 Insomnia, unspecified: Secondary | ICD-10-CM

## 2011-01-15 DIAGNOSIS — R928 Other abnormal and inconclusive findings on diagnostic imaging of breast: Secondary | ICD-10-CM

## 2011-01-15 DIAGNOSIS — I10 Essential (primary) hypertension: Secondary | ICD-10-CM

## 2011-01-15 DIAGNOSIS — I493 Ventricular premature depolarization: Secondary | ICD-10-CM

## 2011-01-15 DIAGNOSIS — R42 Dizziness and giddiness: Secondary | ICD-10-CM

## 2011-01-15 LAB — BASIC METABOLIC PANEL
BUN: 21 mg/dL (ref 6–23)
CO2: 23 mEq/L (ref 19–32)
Glucose, Bld: 158 mg/dL — ABNORMAL HIGH (ref 70–99)
Potassium: 4.6 mEq/L (ref 3.5–5.3)

## 2011-01-15 NOTE — Assessment & Plan Note (Signed)
I did have her fill out an [place] Sleepiness scale and she scored a zero.  Cardiology is concerned that she has OSA.  She is agreeable to a home sleep study.

## 2011-01-15 NOTE — Telephone Encounter (Signed)
Notified pt. She states she still has a 1 month supply of Cymbalta and is waiting on her insurance to get back with her regarding coverage. Pt states she will call us back and let us know how she wants to proceed.

## 2011-01-15 NOTE — Progress Notes (Signed)
Subjective:    Patient ID: Kimberly Lin, female    DOB: 06-13-1945, 65 y.o.   MRN: [id number]  HPI  Kimberly Lin is a 65 yr old female who presents today for follow up.  HTN-  She is currently on losartan and tolerating without difficulty. She reports that her dizziness is resolved. She is not taking claritin due to cost, but is using benadryl prn.   PVC's- she saw cardiology.  No further work up is planned at this time.  (Cardiology consult is reviewed)  Difficulty sleeping- she lays in bed at 10PM. If she takes xanax- takes 2 hrs to fall asleep, wakes up frequently.  Wakes between between 7A and 8A.  1 cup of coffee in the AM. She does not nap during the day.     Review of Systems See HPI  Past Medical History  Diagnosis Date  . Arthritis     osteoarthritis  . Fibromyalgia   . History of chicken pox   . Depression   . Diabetes mellitus   . GERD (gastroesophageal reflux disease)   . Hyperlipidemia   . Hypertension   . Migraine   . Personal history of colonic polyps   . History of septic shock 12/2009  . Spinal stenosis     History   Social History  . Marital Status: Married    Spouse Name: N/A    Number of Children: 2  . Years of Education: N/A   Occupational History  . Not on file.   Social History Main Topics  . Smoking status: Former Games developer  . Smokeless tobacco: Never Used   Comment: Smoked socially.  . Alcohol Use: 3.6 - 4.8 oz/week    6-8 Glasses of wine per week  . Drug Use: No  . Sexually Active: Not on file   Other Topics Concern  . Not on file   Social History Narrative   Regular exercise:  NoCaffeine use: 1 cup daily    Past Surgical History  Procedure Date  . Breast biopsy 2007, 2009    right breast  . Appendectomy 1983  . Joint replacement 2006    bilateral partial knee replacement  . Cystectomy 2008    Pt states she had a ?cyst removed ?ovaries    Family History  Problem Relation Age of Onset  . Cancer Mother     breast and  lung  . Arthritis Mother   . Hyperlipidemia Mother   . Hypertension Mother   . Diabetes Mother   . Heart disease Mother   . Cancer Brother     lung, prostate  . Heart disease Brother   . Hypertension Brother   . Hyperlipidemia Brother   . Diabetes Brother   . Sudden death Father   . Diabetes Sister   . Hyperlipidemia Sister   . Hypertension Sister   . Diabetes Maternal Aunt   . Heart attack Maternal Aunt   . Hyperlipidemia Maternal Aunt   . Hypertension Maternal Aunt   . Hypertension Maternal Uncle   . Hyperlipidemia Maternal Uncle   . Heart attack Maternal Uncle   . Diabetes Maternal Uncle   . Diabetes Paternal Aunt   . Heart attack Paternal Aunt   . Hyperlipidemia Paternal Aunt   . Hypertension Paternal Aunt   . Hypertension Paternal Uncle   . Hyperlipidemia Paternal Uncle   . Heart attack Paternal Uncle   . Diabetes Paternal Uncle     No Known Allergies  Current Outpatient Prescriptions on  File Prior to Visit  Medication Sig Dispense Refill  . ALPRAZolam (XANAX) 1 MG tablet Take 2 tablets by mouth as needed as needed.      Marland Kitchen aspirin 81 MG tablet Take 81 mg by mouth daily.        . Calcium Carbonate-Vitamin D (CALCIUM-D) 600-400 MG-UNIT TABS Take 1 tablet by mouth daily.       . Cholecalciferol (D-3-5) 5000 UNITS capsule Take 5,000 Units by mouth daily.        Marland Kitchen co-enzyme Q-10 30 MG capsule Take 100 mg by mouth daily.        . DULoxetine (CYMBALTA) 60 MG capsule Take 1 capsule (60 mg total) by mouth daily.  30 capsule  2  . loratadine (CLARITIN) 10 MG tablet Take 10 mg by mouth daily.        Marland Kitchen losartan (COZAAR) 25 MG tablet Take 1 tablet (25 mg total) by mouth daily.  30 tablet  1  . meloxicam (MOBIC) 15 MG tablet Take 1 tablet (15 mg total) by mouth daily. With food.  30 tablet  1  . metFORMIN (GLUCOPHAGE) 500 MG tablet Take 2 tablets (1,000 mg total) by mouth 2 (two) times daily with a meal. Take 2 tablets by mouth twice a day.  120 tablet  3  . metoprolol  (LOPRESSOR) 50 MG tablet Take 0.5 tablets (25 mg total) by mouth 2 (two) times daily. Take 1/2 tablet twice a day.  30 tablet  3  . Multiple Vitamin (MULTIVITAMIN) tablet Take 1 tablet by mouth daily.        Marland Kitchen oxyCODONE-acetaminophen (ROXICET) 5-325 MG per tablet Take 1 tablet by mouth every 6 (six) hours as needed for pain.  60 tablet  0  . pantoprazole (PROTONIX) 40 MG tablet Take 1 tablet (40 mg total) by mouth daily.  30 tablet  5  . Pitavastatin Calcium (LIVALO) 1 MG TABS Take 1 tablet by mouth daily.          BP 132/84  Pulse 72  Temp(Src) 98.1 F (36.7 C) (Oral)  Resp 16  Ht 5' 3.5" (1.613 m)  Wt 183 lb 1.9 oz (83.063 kg)  BMI 31.93 kg/m2       Objective:   Physical Exam  Constitutional: She appears well-developed and well-nourished.  Cardiovascular: Normal rate and regular rhythm.   No murmur heard. Pulmonary/Chest: Effort normal and breath sounds normal. No respiratory distress. She has no wheezes. She has no rales. She exhibits no tenderness.  Musculoskeletal: She exhibits no edema.  Psychiatric: She has a normal mood and affect. Her behavior is normal. Judgment and thought content normal.          Assessment & Plan:

## 2011-01-15 NOTE — Telephone Encounter (Signed)
SHE HAS BEEN TAKING THE CYMBALTA FOR 3 DAYS.  SHE HAS STOPPED THE EFFEXOR.  SHE NEEDS YOU TO THINK OF SOMETHING OTHER THAN CYMBALTA AS IT COSTS HER $70 PLUS FOR 30 DAYS   SHE WILL TALK TO HER AGENT TODAY TO SEE IF SHE CAN FIND SOMEONE WHO WILL COVER THIS MED AT A LOWER PRICE

## 2011-01-15 NOTE — Telephone Encounter (Signed)
If she wants, we can stop cymbalta and I can put her back on effexor plus citalopram.  It will be cheaper, but may not be as effective for her fibromyalgia.

## 2011-01-15 NOTE — Assessment & Plan Note (Signed)
No further work up planned per cardiology.  

## 2011-01-15 NOTE — Assessment & Plan Note (Signed)
Resolved

## 2011-01-15 NOTE — Telephone Encounter (Signed)
Please advise 

## 2011-01-15 NOTE — Assessment & Plan Note (Signed)
BP is stable on Cozaar. Cont same, obtain BMET.

## 2011-01-15 NOTE — Patient Instructions (Addendum)
You will be contacted about your sleep study.   Please follow up in 3 months, sooner if problems or concerns.

## 2011-01-16 ENCOUNTER — Encounter: Payer: Medicare Other | Admitting: Physical Therapy

## 2011-01-17 ENCOUNTER — Encounter: Payer: Self-pay | Admitting: Family

## 2011-01-17 ENCOUNTER — Encounter: Payer: Self-pay | Admitting: Family Medicine

## 2011-01-17 ENCOUNTER — Ambulatory Visit (INDEPENDENT_AMBULATORY_CARE_PROVIDER_SITE_OTHER): Payer: Medicare Other | Admitting: Family Medicine

## 2011-01-17 VITALS — BP 172/82 | HR 68 | Temp 97.8°F | Ht 63.0 in | Wt 180.0 lb

## 2011-01-17 DIAGNOSIS — M25511 Pain in right shoulder: Secondary | ICD-10-CM

## 2011-01-17 DIAGNOSIS — M25519 Pain in unspecified shoulder: Secondary | ICD-10-CM

## 2011-01-17 NOTE — Assessment & Plan Note (Signed)
Following right shoulder dislocation 3 months ago - Patient continues to struggle and pain has not improved much (though strength better) from last OV despite PT, home exercises, mobic, hydrocodone.  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  Will call with results and how to proceed following this study.

## 2011-01-17 NOTE — Progress Notes (Addendum)
Subjective:    Patient ID: Kimberly Lin, female    DOB: [birth year], 65 y.o.   MRN: [id number]  PCP: Sandford Craze  HPI  65 yo F here for f/u right shoulder pain.  11/6: Patient's issues with right shoulder started on 10/23/2010 when she got up in the middle of the night, slipped and fell forward hitting right shoulder on a chair - caused this to dislocate. Waited 3 hours to present to ED - had conscious sedation then reduced her shoulder dislocation. No evidence of bony bankart or hill sachs on radiographs performed (did not have axillary view done). She has improved but still has 2/10 pain at rest, cannot sleep on her right side as it will wake her up. Motion has returned but has pain especially with reaching and overhead activities. Is right handed. No numbness into arm. Has not tried PT for this. Was given oxycodone from ED back in September when this happened but not taking anything now for pain.  12/7: Patient has been compliant with home exercises and physical therapy for shoulder. Taking meloxicam with hydrocodone as needed. Has not had another dislocation since last visit. Pain worse with activity. In PT has had only mild improvement in motion and strength. Still hurts to lie on this side and limiting her ability to do activities.  Past Medical History  Diagnosis Date  . Arthritis     osteoarthritis  . Fibromyalgia   . History of chicken pox   . Depression   . Diabetes mellitus   . GERD (gastroesophageal reflux disease)   . Hyperlipidemia   . Hypertension   . Migraine   . Personal history of colonic polyps   . History of septic shock 12/2009  . Spinal stenosis     Current Outpatient Prescriptions on File Prior to Visit  Medication Sig Dispense Refill  . ALPRAZolam (XANAX) 1 MG tablet Take 2 tablets by mouth as needed as needed.      Marland Kitchen aspirin 81 MG tablet Take 81 mg by mouth daily.        . Calcium Carbonate-Vitamin D (CALCIUM-D) 600-400 MG-UNIT TABS  Take 1 tablet by mouth daily.       . Cholecalciferol (D-3-5) 5000 UNITS capsule Take 5,000 Units by mouth daily.        Marland Kitchen co-enzyme Q-10 30 MG capsule Take 100 mg by mouth daily.        . DULoxetine (CYMBALTA) 60 MG capsule Take 1 capsule (60 mg total) by mouth daily.  30 capsule  2  . loratadine (CLARITIN) 10 MG tablet Take 10 mg by mouth daily.        Marland Kitchen losartan (COZAAR) 25 MG tablet Take 1 tablet (25 mg total) by mouth daily.  30 tablet  1  . meloxicam (MOBIC) 15 MG tablet Take 1 tablet (15 mg total) by mouth daily. With food.  30 tablet  1  . metFORMIN (GLUCOPHAGE) 500 MG tablet Take 2 tablets (1,000 mg total) by mouth 2 (two) times daily with a meal. Take 2 tablets by mouth twice a day.  120 tablet  3  . metoprolol (LOPRESSOR) 50 MG tablet Take 0.5 tablets (25 mg total) by mouth 2 (two) times daily. Take 1/2 tablet twice a day.  30 tablet  3  . Multiple Vitamin (MULTIVITAMIN) tablet Take 1 tablet by mouth daily.        Marland Kitchen oxyCODONE-acetaminophen (ROXICET) 5-325 MG per tablet Take 1 tablet by mouth every 6 (six) hours as needed  for pain.  60 tablet  0  . pantoprazole (PROTONIX) 40 MG tablet Take 1 tablet (40 mg total) by mouth daily.  30 tablet  5  . Pitavastatin Calcium (LIVALO) 1 MG TABS Take 1 tablet by mouth daily.          Past Surgical History  Procedure Date  . Breast biopsy 2007, 2009    right breast  . Appendectomy 1983  . Joint replacement 2006    bilateral partial knee replacement  . Cystectomy 2008    Pt states she had a ?cyst removed ?ovaries    No Known Allergies  History   Social History  . Marital Status: Married    Spouse Name: N/A    Number of Children: 2  . Years of Education: N/A   Occupational History  . Not on file.   Social History Main Topics  . Smoking status: Former Games developer  . Smokeless tobacco: Never Used   Comment: Smoked socially.  . Alcohol Use: 3.6 - 4.8 oz/week    6-8 Glasses of wine per week  . Drug Use: No  . Sexually Active: Not on  file   Other Topics Concern  . Not on file   Social History Narrative   Regular exercise:  NoCaffeine use: 1 cup daily    Family History  Problem Relation Age of Onset  . Cancer Mother     breast and lung  . Arthritis Mother   . Hyperlipidemia Mother   . Hypertension Mother   . Diabetes Mother   . Heart disease Mother   . Cancer Brother     lung, prostate  . Heart disease Brother   . Hypertension Brother   . Hyperlipidemia Brother   . Diabetes Brother   . Sudden death Father   . Diabetes Sister   . Hyperlipidemia Sister   . Hypertension Sister   . Diabetes Maternal Aunt   . Heart attack Maternal Aunt   . Hyperlipidemia Maternal Aunt   . Hypertension Maternal Aunt   . Hypertension Maternal Uncle   . Hyperlipidemia Maternal Uncle   . Heart attack Maternal Uncle   . Diabetes Maternal Uncle   . Diabetes Paternal Aunt   . Heart attack Paternal Aunt   . Hyperlipidemia Paternal Aunt   . Hypertension Paternal Aunt   . Hypertension Paternal Uncle   . Hyperlipidemia Paternal Uncle   . Heart attack Paternal Uncle   . Diabetes Paternal Uncle     BP 172/82  Pulse 68  Temp(Src) 97.8 F (36.6 C) (Oral)  Ht 5\' 3"  (1.6 m)  Wt 180 lb (81.647 kg)  BMI 31.89 kg/m2  Review of Systems  See HPI above.    Objective:   Physical Exam  Gen: NAD  R shoulder: No swelling, ecchymoses.  No gross deformity. Mild TTP at biceps tendon, AC joint. FROM with + painful arc.  No frozen shoulder. + hawkins and neers. Strength 5-/5 with Empty can and painful.  Strength 5/5 with resisted internal/external rotation without pain. Positive apprehension and O'briens. NV intact distally.     Assessment & Plan:  1. Right shoulder dislocation - Patient continues to struggle and pain has not improved much (though strength better) from last OV despite PT, home exercises, mobic, hydrocodone.  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   Will call with results and how to proceed following this study.  Addendum: MR arthrogram reviewed and discussed with patient.  Surprisingly no evidence of labral tear on MR.  Does have possible partial thickness supraspinatus tear.  Her strength improvements and initial ultrasound would suggest if present this is not full thickness and should improve with further PT, possible cortisone injection.  I advised she come back into the office and will repeat ultrasound to look better at supraspinatus then consider more PT + combination subacromial/glenohumeral injection - she will make appointment.

## 2011-01-21 ENCOUNTER — Other Ambulatory Visit (HOSPITAL_COMMUNITY): Payer: Medicare Other

## 2011-01-23 ENCOUNTER — Ambulatory Visit (HOSPITAL_COMMUNITY)
Admission: RE | Admit: 2011-01-23 | Discharge: 2011-01-23 | Disposition: A | Discharge status: Home / Self Care | Payer: Medicare Other | Source: Ambulatory Visit | Attending: Family Medicine | Admitting: Family Medicine

## 2011-01-23 ENCOUNTER — Other Ambulatory Visit: Payer: Self-pay | Admitting: *Deleted

## 2011-01-23 DIAGNOSIS — M25511 Pain in right shoulder: Secondary | ICD-10-CM

## 2011-01-23 DIAGNOSIS — M25519 Pain in unspecified shoulder: Secondary | ICD-10-CM | POA: Insufficient documentation

## 2011-01-23 MED ORDER — LIDOCAINE HCL 1 % IJ SOLN: 7.0000 mL | Freq: Once | INTRAMUSCULAR | Status: DC

## 2011-01-23 MED ORDER — PITAVASTATIN CALCIUM 1 MG PO TABS: ORAL_TABLET | ORAL | Status: DC

## 2011-01-23 MED ORDER — IOHEXOL 300 MG/ML  SOLN
10.0000 mL | Freq: Once | INTRAMUSCULAR | Status: AC | PRN
  Administered 2011-01-23: 10 mL via INTRA_ARTICULAR

## 2011-01-23 NOTE — Telephone Encounter (Signed)
Received message from pt requesting samples of Livalo and wanting to know if this medication is on her formulary. Advised pt she could pick up Livalo 2mg  and take 1/2 tablet daily to equal 1mg  dose. Instructed pt to contact her insurance re: formulary coverage and to let us know if we need to find another alternative. Pt voices understanding. Samples left at front desk for pick up.

## 2011-01-23 NOTE — Procedures (Signed)
Time-out procedure was performed. The procedure and potential complications (bleeding, infection, contrast reaction) were discussed with the patient. Informed written consent was obtained. An appropriate skin entrance site was determined. The site was marked, prepped and draped in typical sterile fashion, and infiltrated locally with 1% lidocaine. A 22 gauge spinal needle was advanced to the superomedial margin of the right humeral head under fluoroscopy. 10 ml of a mixture of 0.06 ml Multihance in 10 ml of Omnipaque-300 and 2 ml 1% lidocaine was then used to opacify the right shoulder capsule. There were no immediate complications.

## 2011-02-05 ENCOUNTER — Encounter: Payer: Self-pay | Admitting: Family Medicine

## 2011-02-05 ENCOUNTER — Ambulatory Visit (INDEPENDENT_AMBULATORY_CARE_PROVIDER_SITE_OTHER): Payer: BLUE CROSS/BLUE SHIELD | Admitting: Family Medicine

## 2011-02-05 VITALS — BP 166/90 | HR 69 | Temp 98.2°F | Ht 64.0 in | Wt 180.0 lb

## 2011-02-05 DIAGNOSIS — M25511 Pain in right shoulder: Secondary | ICD-10-CM

## 2011-02-05 DIAGNOSIS — M25519 Pain in unspecified shoulder: Secondary | ICD-10-CM

## 2011-02-05 NOTE — Progress Notes (Signed)
Subjective:    Patient ID: Kimberly Lin, female    DOB: Sep 23, 1945, 65 y.o.   MRN: [id number]  PCP: Sandford Craze  HPI  65 yo F here for f/u right shoulder pain.  11/6: Patient's issues with right shoulder started on 10/23/2010 when she got up in the middle of the night, slipped and fell forward hitting right shoulder on a chair - caused this to dislocate. Waited 3 hours to present to ED - had conscious sedation then reduced her shoulder dislocation. No evidence of bony bankart or hill sachs on radiographs performed (did not have axillary view done). She has improved but still has 2/10 pain at rest, cannot sleep on her right side as it will wake her up. Motion has returned but has pain especially with reaching and overhead activities. Is right handed. No numbness into arm. Has not tried PT for this. Was given oxycodone from ED back in September when this happened but not taking anything now for pain.  12/7: Patient has been compliant with home exercises and physical therapy for shoulder. Taking meloxicam with hydrocodone as needed. Has not had another dislocation since last visit. Pain worse with activity. In PT has had only mild improvement in motion and strength. Still hurts to lie on this side and limiting her ability to do activities.  12/26: Patient returned today for reevaluation, ultrasound of shoulder - MR arthrogram without evidence labral tear, question supraspinatus tear. She is some better. Pain comes and goes. Was held out of PT while waiting on MR arthrogram. Still waking at night though not as much.  Past Medical History  Diagnosis Date  . Arthritis     osteoarthritis  . Fibromyalgia   . History of chicken pox   . Depression   . Diabetes mellitus   . GERD (gastroesophageal reflux disease)   . Hyperlipidemia   . Hypertension   . Migraine   . Personal history of colonic polyps   . History of septic shock 12/2009  . Spinal stenosis     Current  Outpatient Prescriptions on File Prior to Visit  Medication Sig Dispense Refill  . ALPRAZolam (XANAX) 1 MG tablet Take 2 tablets by mouth as needed as needed.      Marland Kitchen aspirin 81 MG tablet Take 81 mg by mouth daily.        Marland Kitchen BENICAR 20 MG tablet       . Calcium Carbonate-Vitamin D (CALCIUM-D) 600-400 MG-UNIT TABS Take 1 tablet by mouth daily.       . Cholecalciferol (D-3-5) 5000 UNITS capsule Take 5,000 Units by mouth daily.        Marland Kitchen co-enzyme Q-10 30 MG capsule Take 100 mg by mouth daily.        . DULoxetine (CYMBALTA) 60 MG capsule Take 1 capsule (60 mg total) by mouth daily.  30 capsule  2  . loratadine (CLARITIN) 10 MG tablet Take 10 mg by mouth daily.        Marland Kitchen losartan (COZAAR) 25 MG tablet Take 1 tablet (25 mg total) by mouth daily.  30 tablet  1  . meloxicam (MOBIC) 15 MG tablet Take 1 tablet (15 mg total) by mouth daily. With food.  30 tablet  1  . metFORMIN (GLUCOPHAGE) 500 MG tablet Take 2 tablets (1,000 mg total) by mouth 2 (two) times daily with a meal. Take 2 tablets by mouth twice a day.  120 tablet  3  . metoprolol (LOPRESSOR) 50 MG tablet Take 0.5 tablets (  25 mg total) by mouth 2 (two) times daily. Take 1/2 tablet twice a day.  30 tablet  3  . Multiple Vitamin (MULTIVITAMIN) tablet Take 1 tablet by mouth daily.        Marland Kitchen oxyCODONE-acetaminophen (ROXICET) 5-325 MG per tablet Take 1 tablet by mouth every 6 (six) hours as needed for pain.  60 tablet  0  . pantoprazole (PROTONIX) 40 MG tablet Take 1 tablet (40 mg total) by mouth daily.  30 tablet  5  . Pitavastatin Calcium (LIVALO) 1 MG TABS Pt given 2mg  tablets and instructed to take 1/2 tablet daily. LOT [id number]  EXP 11/2012  14 tablet  0    Past Surgical History  Procedure Date  . Breast biopsy 2007, 2009    right breast  . Appendectomy 1983  . Joint replacement 2006    bilateral partial knee replacement  . Cystectomy 2008    Pt states she had a ?cyst removed ?ovaries    No Known Allergies  History   Social History  .  Marital Status: Married    Spouse Name: N/A    Number of Children: 2  . Years of Education: N/A   Occupational History  . Not on file.   Social History Main Topics  . Smoking status: Former Games developer  . Smokeless tobacco: Never Used   Comment: Smoked socially.  . Alcohol Use: 3.6 - 4.8 oz/week    6-8 Glasses of wine per week  . Drug Use: No  . Sexually Active: Not on file   Other Topics Concern  . Not on file   Social History Narrative   Regular exercise:  NoCaffeine use: 1 cup daily    Family History  Problem Relation Age of Onset  . Cancer Mother     breast and lung  . Arthritis Mother   . Hyperlipidemia Mother   . Hypertension Mother   . Diabetes Mother   . Heart disease Mother   . Cancer Brother     lung, prostate  . Heart disease Brother   . Hypertension Brother   . Hyperlipidemia Brother   . Diabetes Brother   . Sudden death Father   . Diabetes Sister   . Hyperlipidemia Sister   . Hypertension Sister   . Diabetes Maternal Aunt   . Heart attack Maternal Aunt   . Hyperlipidemia Maternal Aunt   . Hypertension Maternal Aunt   . Hypertension Maternal Uncle   . Hyperlipidemia Maternal Uncle   . Heart attack Maternal Uncle   . Diabetes Maternal Uncle   . Diabetes Paternal Aunt   . Heart attack Paternal Aunt   . Hyperlipidemia Paternal Aunt   . Hypertension Paternal Aunt   . Hypertension Paternal Uncle   . Hyperlipidemia Paternal Uncle   . Heart attack Paternal Uncle   . Diabetes Paternal Uncle     BP 166/90  Pulse 69  Temp(Src) 98.2 F (36.8 C) (Oral)  Ht 5\' 4"  (1.626 m)  Wt 180 lb (81.647 kg)  BMI 30.90 kg/m2  Review of Systems  See HPI above.    Objective:   Physical Exam  Gen: NAD  R shoulder: No swelling, ecchymoses.  No gross deformity. No TTP at biceps tendon, AC joint. FROM with + painful arc.  No frozen shoulder. + hawkins and neers. Strength 5-/5 with Empty can and painful.  Strength 5/5 with resisted internal/external rotation  without pain. Mildly positive apprehension and O'briens. NV intact distally.  MSK u/s R shoulder: Supraspinatus appears  intact with possible exception of very lateral insertion - calcification and some hypoechogenicity here but represents < 5% of insertion on footplate.  Images saved.    Assessment & Plan:  1. Right shoulder dislocation - MR arthrogram reassuring - no evidence of labral tear.  Possible partial thickness supraspinatus tear - if present is very small based on ultrasound, no evidence of retraction and mostly preserved strength.  Discussed options - patient would like to restart PT.  Will consider repeat cortisone injection, nitro patches in future if not improving.

## 2011-02-05 NOTE — Assessment & Plan Note (Signed)
Right shoulder dislocation - MR arthrogram reassuring - no evidence of labral tear.  Possible partial thickness supraspinatus tear - if present is very small based on ultrasound, no evidence of retraction and mostly preserved strength.  Discussed options - patient would like to restart PT.  Will consider repeat cortisone injection, nitro patches in future if not improving.

## 2011-02-10 MED ORDER — GADOBENATE DIMEGLUMINE 529 MG/ML IV SOLN
5.0000 mL | Freq: Once | INTRAVENOUS | Status: AC | PRN
  Administered 2011-02-10: 5 mL via INTRAVENOUS

## 2011-02-13 ENCOUNTER — Telehealth: Payer: Self-pay | Admitting: *Deleted

## 2011-02-13 NOTE — Telephone Encounter (Signed)
Received message from pt that she is running out of Cymbalta and it will not be covered by her ins. Pt is requesting formulary alternative or cheaper substitute. Left detailed message for pt to return my call and let us know if she checked her formulary for covered alternatives.

## 2011-02-14 NOTE — Telephone Encounter (Signed)
Pt called stating she could not pull the information up on the website and was unable to get information from customer service. She was told that the Benicar copay should be $35 so pt states she is going to continue both meds until she sees [name] again and will discuss further at that time.

## 2011-02-14 NOTE — Telephone Encounter (Signed)
Spoke with pt, she states Cymbalta will be $35 and she can continue that. She is requesting replacement for Benicar as it will cost $70 through her current plan. Pt will check her formulary and call me back with possible alternatives.

## 2011-02-19 ENCOUNTER — Telehealth: Payer: Self-pay | Admitting: *Deleted

## 2011-02-19 MED ORDER — PITAVASTATIN CALCIUM 1 MG PO TABS: 1.0000 mg | ORAL_TABLET | Freq: Every day | ORAL | Status: DC

## 2011-02-19 NOTE — Telephone Encounter (Signed)
Received message from pt requesting samples of Livalo. Attempted to reach pt and left detailed message that Livalo 2mg  (1 bx) has been placed at the front desk for pick and to call if any questions. Pt will need to take 1/2 tablet daily.

## 2011-02-20 ENCOUNTER — Encounter: Payer: Self-pay | Admitting: Family Medicine

## 2011-02-20 ENCOUNTER — Other Ambulatory Visit: Payer: Self-pay | Admitting: Family Medicine

## 2011-02-20 ENCOUNTER — Telehealth: Payer: Self-pay | Admitting: Family

## 2011-02-20 DIAGNOSIS — G478 Other sleep disorders: Secondary | ICD-10-CM

## 2011-02-20 DIAGNOSIS — M25511 Pain in right shoulder: Secondary | ICD-10-CM

## 2011-02-20 MED ORDER — OXYCODONE-ACETAMINOPHEN 5-325 MG PO TABS: 1.0000 | ORAL_TABLET | Freq: Four times a day (QID) | ORAL | Status: DC | PRN

## 2011-02-20 NOTE — Telephone Encounter (Signed)
This was ordered on 12/5, could you pls check with pulmonary as they should have received order and should contact pt directly.

## 2011-02-20 NOTE — Assessment & Plan Note (Signed)
Called for ? Injection vs refill on pain medication - will give 1 additional fill of percocet #60 q6h prn.

## 2011-02-20 NOTE — Telephone Encounter (Signed)
She would like to do the home sleep study

## 2011-02-20 NOTE — Telephone Encounter (Signed)
Please advise 

## 2011-02-21 ENCOUNTER — Telehealth: Payer: Self-pay

## 2011-02-21 NOTE — Telephone Encounter (Signed)
Spoke with pulmonology and they state Almyra Free will call me back re: status.

## 2011-02-21 NOTE — Telephone Encounter (Signed)
Dr's office in [place] dis a precert on this home npsg and pt has been called [facility] to set this up

## 2011-02-24 NOTE — Telephone Encounter (Signed)
Kimberly Lin states they had not received order and we will also need to verify if pre-cert is needed. Per Kimberly Lin, order has been sent and Kimberly Lin will contact pt.

## 2011-02-27 ENCOUNTER — Telehealth: Payer: Self-pay | Admitting: Family

## 2011-02-27 MED ORDER — LOSARTAN POTASSIUM 25 MG PO TABS: 25.0000 mg | ORAL_TABLET | Freq: Every day | ORAL | Status: DC

## 2011-02-27 NOTE — Telephone Encounter (Signed)
Refill sent to pharmacy for Cozaar #30 x 2 refills.

## 2011-03-11 ENCOUNTER — Telehealth: Payer: Self-pay | Admitting: *Deleted

## 2011-03-11 MED ORDER — PITAVASTATIN CALCIUM 1 MG PO TABS: 1.0000 mg | ORAL_TABLET | Freq: Every day | ORAL | Status: DC

## 2011-03-11 NOTE — Telephone Encounter (Signed)
Received call from pt requesting samples of Livalo and results of home sleep study. Advised pt that samples have been left at the front desk for pick up and we should be getting sleep study results by the end of the day and will call her once they have been reviewed. Per Almyra Free at pulmonology, we should get result today.

## 2011-03-12 ENCOUNTER — Telehealth: Payer: Self-pay | Admitting: Family

## 2011-03-12 ENCOUNTER — Encounter: Payer: Self-pay | Admitting: Family

## 2011-03-12 DIAGNOSIS — G4733 Obstructive sleep apnea (adult) (pediatric): Secondary | ICD-10-CM | POA: Insufficient documentation

## 2011-03-12 HISTORY — DX: Obstructive sleep apnea (adult) (pediatric): G47.33

## 2011-03-12 NOTE — Telephone Encounter (Signed)
Spoke with pt.  Reviewed results of sleep study- mild OSA.  She wishes to focus on weight loss at this time which is reasonable.

## 2011-03-12 NOTE — Telephone Encounter (Signed)
Left message for pt to return our call to discuss sleep study.

## 2011-03-13 NOTE — Telephone Encounter (Signed)
Result was received and discussed with pt yesterday. See 03/12/11 phone note.

## 2011-03-17 ENCOUNTER — Ambulatory Visit (INDEPENDENT_AMBULATORY_CARE_PROVIDER_SITE_OTHER): Payer: Medicare Other | Admitting: Pulmonary Disease

## 2011-03-17 DIAGNOSIS — G4700 Insomnia, unspecified: Secondary | ICD-10-CM

## 2011-03-17 DIAGNOSIS — G4733 Obstructive sleep apnea (adult) (pediatric): Secondary | ICD-10-CM

## 2011-03-25 ENCOUNTER — Telehealth: Payer: Self-pay | Admitting: *Deleted

## 2011-03-25 MED ORDER — ALPRAZOLAM 1 MG PO TABS: 1.0000 mg | ORAL_TABLET | Freq: Every evening | ORAL | Status: DC | PRN

## 2011-03-25 NOTE — Telephone Encounter (Signed)
Refill left on [facility] voicemail.

## 2011-03-25 NOTE — Telephone Encounter (Signed)
Received voice message from pt that she used to take Ambien 12.5mg  in the past to help her sleep. States she is not sleeping well and needs this called in to help her sleep. Attempted to reach pt and left message to return my call.

## 2011-03-25 NOTE — Telephone Encounter (Signed)
Notified pt, attempted to call rx to pharmacy and received busy signal.

## 2011-03-25 NOTE — Telephone Encounter (Signed)
Please let pt know that the recommended dose for pt's age 66 and over is 5mg .  Please  see pended rx below please.

## 2011-03-25 NOTE — Telephone Encounter (Signed)
Pt returned my call stating she would like samples of ?Ambien CR 12.5mg  (that she took in the past) or Rx of xanax. Pt states she has had trouble falling asleep for years. Pt states she has been taking Xanax 2 tablets at bedtime to help her fall asleep and has run out. Please advise.

## 2011-03-25 NOTE — Telephone Encounter (Signed)
We do not have ambien samples.  In place of Ambien, ok to call in Alprazolam 1mg  HS PRN #30, no refills.

## 2011-04-03 ENCOUNTER — Other Ambulatory Visit: Payer: Self-pay | Admitting: Family

## 2011-04-04 MED ORDER — PITAVASTATIN CALCIUM 1 MG PO TABS: 1.0000 mg | ORAL_TABLET | Freq: Every day | ORAL | Status: DC

## 2011-04-04 NOTE — Telephone Encounter (Signed)
Refill sent to pharmacy for Benicar #30 x 1 refill. Pt called requesting samples of Livalo and was notified samples are at front desk for pick up.

## 2011-04-15 ENCOUNTER — Ambulatory Visit (INDEPENDENT_AMBULATORY_CARE_PROVIDER_SITE_OTHER): Payer: Medicare Other | Admitting: Family

## 2011-04-15 ENCOUNTER — Encounter: Payer: Self-pay | Admitting: Family

## 2011-04-15 DIAGNOSIS — R0602 Shortness of breath: Secondary | ICD-10-CM | POA: Diagnosis not present

## 2011-04-15 DIAGNOSIS — E119 Type 2 diabetes mellitus without complications: Secondary | ICD-10-CM

## 2011-04-15 DIAGNOSIS — I10 Essential (primary) hypertension: Secondary | ICD-10-CM | POA: Diagnosis not present

## 2011-04-15 DIAGNOSIS — R0609 Other forms of dyspnea: Secondary | ICD-10-CM

## 2011-04-15 DIAGNOSIS — E785 Hyperlipidemia, unspecified: Secondary | ICD-10-CM

## 2011-04-15 DIAGNOSIS — G4700 Insomnia, unspecified: Secondary | ICD-10-CM

## 2011-04-15 DIAGNOSIS — R0600 Dyspnea, unspecified: Secondary | ICD-10-CM

## 2011-04-15 DIAGNOSIS — G4733 Obstructive sleep apnea (adult) (pediatric): Secondary | ICD-10-CM

## 2011-04-15 DIAGNOSIS — R0989 Other specified symptoms and signs involving the circulatory and respiratory systems: Secondary | ICD-10-CM

## 2011-04-15 LAB — HEPATIC FUNCTION PANEL
ALT: 25 U/L (ref 0–35)
Bilirubin, Direct: 0.1 mg/dL (ref 0.0–0.3)
Indirect Bilirubin: 0.4 mg/dL (ref 0.0–0.9)
Total Bilirubin: 0.5 mg/dL (ref 0.3–1.2)

## 2011-04-15 LAB — LIPID PANEL: Cholesterol: 200 mg/dL (ref 0–200)

## 2011-04-15 LAB — BASIC METABOLIC PANEL
BUN: 13 mg/dL (ref 6–23)
CO2: 25 mEq/L (ref 19–32)
Glucose, Bld: 131 mg/dL — ABNORMAL HIGH (ref 70–99)
Potassium: 4.3 mEq/L (ref 3.5–5.3)

## 2011-04-15 MED ORDER — PITAVASTATIN CALCIUM 1 MG PO TABS: 1.0000 mg | ORAL_TABLET | Freq: Every day | ORAL | Status: DC

## 2011-04-15 MED ORDER — ZOLPIDEM TARTRATE 5 MG PO TABS: ORAL_TABLET | ORAL | Status: DC

## 2011-04-15 NOTE — Patient Instructions (Signed)
You will be contacted about your referral for stress test.  Please complete your lab work prior to leaving today. Follow up in 2 months.

## 2011-04-15 NOTE — Assessment & Plan Note (Signed)
Pt is at risk for CAD due to multiple risk factors and strong family hx of CAD.  EKG performed today in the office is reviewed and compared to prior. TWI V1-V4.  Will refer for stress test. She denies chest pain or SOB at rest, however she is instructed to go to the ED if she develops chest pain/worsening shortness of breath and verbalizes understanding. EKG and cardiac plan was discussed with Dr. Rodena Medin.

## 2011-04-15 NOTE — Assessment & Plan Note (Signed)
BP Readings from Last 3 Encounters:  04/15/11 152/90  02/05/11 166/90  01/17/11 172/82  She reports that she did not take her BP med this AM.  Reinforced importance of compliance.

## 2011-04-15 NOTE — Progress Notes (Signed)
Subjective:    Patient ID: Kimberly Lin, female    DOB: 1946/01/15, 66 y.o.   MRN: [id number]  HPI  Kimberly Lin is a 66 yr old female who presents today for follow up.  HTN- She did not take her bp meds yet this AM.  Insomnia- reports that she has had 3 nights without "any" sleep.  She reports that [place] and xanax are not covered. She reports that she has tried benadryl at night.    She reports that she has used amitriptylline in the past- doen't want to try again.  She would like to try [place] again and will consider paying out of pocket.   Weight gain- reports that she has tried cutting out carbs, but has still gained about 5 pounds since last visit.  Not exercising.   Shortness of breath-  Reports that she gets winded easily when she plays with her grand child.  She denies associated chest pain.  Brothers have hx of CAD. Reports hx of stress test "years ago."   Review of Systems See HPI  Past Medical History  Diagnosis Date  . Arthritis     osteoarthritis  . Fibromyalgia   . History of chicken pox   . Depression   . Diabetes mellitus   . GERD (gastroesophageal reflux disease)   . Hyperlipidemia   . Hypertension   . Migraine   . Personal history of colonic polyps   . History of septic shock 12/2009  . Spinal stenosis   . OSA (obstructive sleep apnea) 03/12/2011    History   Social History  . Marital Status: Married    Spouse Name: N/A    Number of Children: 2  . Years of Education: N/A   Occupational History  . Not on file.   Social History Main Topics  . Smoking status: Former Games developer  . Smokeless tobacco: Never Used   Comment: Smoked socially.  . Alcohol Use: 3.6 - 4.8 oz/week    6-8 Glasses of wine per week  . Drug Use: No  . Sexually Active: Not on file   Other Topics Concern  . Not on file   Social History Narrative   Regular exercise:  NoCaffeine use: 1 cup daily    Past Surgical History  Procedure Date  . Breast biopsy 2007, 2009    right  breast  . Appendectomy 1983  . Joint replacement 2006    bilateral partial knee replacement  . Cystectomy 2008    Pt states she had a ?cyst removed ?ovaries    Family History  Problem Relation Age of Onset  . Cancer Mother     breast and lung  . Arthritis Mother   . Hyperlipidemia Mother   . Hypertension Mother   . Diabetes Mother   . Heart disease Mother   . Cancer Brother     lung, prostate  . Heart disease Brother   . Hypertension Brother   . Hyperlipidemia Brother   . Diabetes Brother   . Sudden death Father   . Diabetes Sister   . Hyperlipidemia Sister   . Hypertension Sister   . Diabetes Maternal Aunt   . Heart attack Maternal Aunt   . Hyperlipidemia Maternal Aunt   . Hypertension Maternal Aunt   . Hypertension Maternal Uncle   . Hyperlipidemia Maternal Uncle   . Heart attack Maternal Uncle   . Diabetes Maternal Uncle   . Diabetes Paternal Aunt   . Heart attack Paternal Aunt   .  Hyperlipidemia Paternal Aunt   . Hypertension Paternal Aunt   . Hypertension Paternal Uncle   . Hyperlipidemia Paternal Uncle   . Heart attack Paternal Uncle   . Diabetes Paternal Uncle     No Known Allergies  Current Outpatient Prescriptions on File Prior to Visit  Medication Sig Dispense Refill  . ALPRAZolam (XANAX) 1 MG tablet Take 1 tablet (1 mg total) by mouth at bedtime as needed for sleep.  30 tablet  0  . aspirin 81 MG tablet Take 81 mg by mouth daily.        Marland Kitchen BENICAR 20 MG tablet TAKE ONE TABLET BY MOUTH EVERY DAY  30 each  1  . Calcium Carbonate-Vitamin D (CALCIUM-D) 600-400 MG-UNIT TABS Take 1 tablet by mouth daily.       . Cholecalciferol (D-3-5) 5000 UNITS capsule Take 5,000 Units by mouth daily.        Marland Kitchen co-enzyme Q-10 30 MG capsule Take 100 mg by mouth daily.        . DULoxetine (CYMBALTA) 60 MG capsule Take 1 capsule (60 mg total) by mouth daily.  30 capsule  2  . losartan (COZAAR) 25 MG tablet Take 1 tablet (25 mg total) by mouth daily.  30 tablet  2  .  meloxicam (MOBIC) 15 MG tablet Take 1 tablet (15 mg total) by mouth daily. With food.  30 tablet  1  . metFORMIN (GLUCOPHAGE) 500 MG tablet Take 2 tablets (1,000 mg total) by mouth 2 (two) times daily with a meal. Take 2 tablets by mouth twice a day.  120 tablet  3  . metoprolol (LOPRESSOR) 50 MG tablet Take 0.5 tablets (25 mg total) by mouth 2 (two) times daily. Take 1/2 tablet twice a day.  30 tablet  3  . Multiple Vitamin (MULTIVITAMIN) tablet Take 1 tablet by mouth daily.        Marland Kitchen oxyCODONE-acetaminophen (ROXICET) 5-325 MG per tablet Take 1 tablet by mouth every 6 (six) hours as needed for pain.  60 tablet  0  . pantoprazole (PROTONIX) 40 MG tablet Take 1 tablet (40 mg total) by mouth daily.  30 tablet  5  . loratadine (CLARITIN) 10 MG tablet Take 10 mg by mouth daily.          BP 152/90  Pulse 74  Temp(Src) 97.8 F (36.6 C) (Oral)  Resp 16  Ht 5' 3.5" (1.613 m)  Wt 185 lb 1.3 oz (83.952 kg)  BMI 32.27 kg/m2  SpO2 94%       Objective:   Physical Exam  Constitutional: She appears well-developed and well-nourished. No distress.  HENT:  Head: Normocephalic and atraumatic.  Cardiovascular: Normal rate and regular rhythm.   No murmur heard. Pulmonary/Chest: Effort normal and breath sounds normal. No respiratory distress. She has no wheezes. She has no rales. She exhibits no tenderness.  Abdominal: Soft.  Musculoskeletal: She exhibits no edema.  Skin: Skin is warm and dry.  Psychiatric: She has a normal mood and affect. Her behavior is normal. Judgment and thought content normal.          Assessment & Plan:

## 2011-04-15 NOTE — Assessment & Plan Note (Signed)
Trial of ambien 

## 2011-04-15 NOTE — Assessment & Plan Note (Signed)
Mild, she wishes to pursue weight loss for this. She was counseled on need to keep calorie intake 1200-1500.

## 2011-04-15 NOTE — Assessment & Plan Note (Signed)
Due for A1C, will add on to today's labs if possible. Continue metformin.

## 2011-04-15 NOTE — Assessment & Plan Note (Signed)
Tolerating livalo. Samples provided today. Obtain FLP, LFT.

## 2011-04-16 ENCOUNTER — Telehealth: Payer: Self-pay | Admitting: Family

## 2011-04-16 MED ORDER — FISH OIL 1000 MG PO CAPS: ORAL_CAPSULE | ORAL | Status: DC

## 2011-04-16 NOTE — Telephone Encounter (Signed)
Pls call pt and let her know that I have reviewed her lab work.  Total cholesterol is improving, but her triglycerides are up.  Please ask her to add fish oil 1000mg  caps- 2 caps bid, and increase livalo to 2mg  once daily.  Avoid concentrated sweets.

## 2011-04-17 MED ORDER — PITAVASTATIN CALCIUM 2 MG PO TABS: 1.0000 | ORAL_TABLET | Freq: Every day | ORAL | Status: DC

## 2011-04-17 NOTE — Telephone Encounter (Signed)
Notified pt. She requests to pick up more samples of Livalo. Will leave 2mg  samples (2bxs) at front desk for pick up. Also advised pt that I printed a discount coupon for Cymbalta from [email or web address] to assist with cost.

## 2011-04-17 NOTE — Telephone Encounter (Signed)
Left message on machine to return my call. 

## 2011-04-18 ENCOUNTER — Other Ambulatory Visit: Payer: Self-pay | Admitting: Family

## 2011-04-18 NOTE — Telephone Encounter (Signed)
Alprazolam refill left on pharmacy voicemail #30 x no refills. cymbalta sent via eRx #30 x 2 refills.

## 2011-04-28 ENCOUNTER — Ambulatory Visit (HOSPITAL_COMMUNITY): Payer: Medicare Other | Attending: Family | Admitting: Radiology

## 2011-04-28 VITALS — BP 133/92 | Ht 63.5 in | Wt 183.0 lb

## 2011-04-28 DIAGNOSIS — E785 Hyperlipidemia, unspecified: Secondary | ICD-10-CM | POA: Diagnosis not present

## 2011-04-28 DIAGNOSIS — R9431 Abnormal electrocardiogram [ECG] [EKG]: Secondary | ICD-10-CM | POA: Diagnosis not present

## 2011-04-28 DIAGNOSIS — E119 Type 2 diabetes mellitus without complications: Secondary | ICD-10-CM | POA: Insufficient documentation

## 2011-04-28 DIAGNOSIS — R0609 Other forms of dyspnea: Secondary | ICD-10-CM | POA: Diagnosis not present

## 2011-04-28 DIAGNOSIS — Z87891 Personal history of nicotine dependence: Secondary | ICD-10-CM | POA: Diagnosis not present

## 2011-04-28 DIAGNOSIS — Z8249 Family history of ischemic heart disease and other diseases of the circulatory system: Secondary | ICD-10-CM | POA: Insufficient documentation

## 2011-04-28 DIAGNOSIS — R0602 Shortness of breath: Secondary | ICD-10-CM | POA: Diagnosis not present

## 2011-04-28 DIAGNOSIS — R5383 Other fatigue: Secondary | ICD-10-CM | POA: Insufficient documentation

## 2011-04-28 DIAGNOSIS — R42 Dizziness and giddiness: Secondary | ICD-10-CM | POA: Diagnosis not present

## 2011-04-28 DIAGNOSIS — I10 Essential (primary) hypertension: Secondary | ICD-10-CM | POA: Insufficient documentation

## 2011-04-28 DIAGNOSIS — R079 Chest pain, unspecified: Secondary | ICD-10-CM

## 2011-04-28 DIAGNOSIS — R5381 Other malaise: Secondary | ICD-10-CM | POA: Insufficient documentation

## 2011-04-28 DIAGNOSIS — E669 Obesity, unspecified: Secondary | ICD-10-CM | POA: Diagnosis not present

## 2011-04-28 DIAGNOSIS — R0989 Other specified symptoms and signs involving the circulatory and respiratory systems: Secondary | ICD-10-CM | POA: Insufficient documentation

## 2011-04-28 MED ORDER — TECHNETIUM TC 99M TETROFOSMIN IV KIT
10.8000 | PACK | Freq: Once | INTRAVENOUS | Status: AC | PRN
  Administered 2011-04-28: 11 via INTRAVENOUS

## 2011-04-28 MED ORDER — TECHNETIUM TC 99M TETROFOSMIN IV KIT
33.0000 | PACK | Freq: Once | INTRAVENOUS | Status: AC | PRN
  Administered 2011-04-28: 33 via INTRAVENOUS

## 2011-04-28 MED ORDER — REGADENOSON 0.4 MG/5ML IV SOLN
0.4000 mg | Freq: Once | INTRAVENOUS | Status: AC
  Administered 2011-04-28: 0.4 mg via INTRAVENOUS

## 2011-04-28 NOTE — Progress Notes (Signed)
[facility] SITE 3 NUCLEAR MED [address] [place] [address] [phone]  Cardiology Nuclear Med Study  Kimberly Lin is a 66 y.o. female     MRN : [id number]     DOB: [birth year]  Procedure Date: 04/28/2011  Nuclear Med Background Indication for Stress Test:  Evaluation for Ischemia and Abnormal EKG History:  '04 MPS(VA):Normal per patient>Cath(VA):Normal per patient Cardiac Risk Factors: Family History - CAD, History of Smoking, Hypertension, Lipids, NIDDM and Obesity  Symptoms:  Chest Tightness with (R) Shoulder Pain (last episode of chest discomfort was about 2-3 weeks ago), Dizziness, DOE/SOB, Fatigue and Palpitations   Nuclear Pre-Procedure Caffeine/Decaff Intake:  None NPO After: 7:00pm   Lungs:  clear O2 Sat: 95% on room air. IV 0.9% NS with Angio Cath:  20g  IV Site: R Forearm  IV Started by:  Cathlyn Parsons, RN  Chest Size (in):  42 Cup Size: D  Height: 5' 3.5" (1.613 m)  Weight:  183 lb (83.008 kg)  BMI:  Body mass index is 31.91 kg/(m^2). Tech Comments:  Lopressor held x 14 hrs    Nuclear Med Study 1 or 2 day study: 1 day  Stress Test Type:  Treadmill/Lexiscan  Reading MD: Olga Millers, MD  Order Authorizing Provider:  Roni Bread  Resting Radionuclide: Technetium 72m Tetrofosmin  Resting Radionuclide Dose: 10.8 mCi   Stress Radionuclide:  Technetium 74m Tetrofosmin  Stress Radionuclide Dose: 33.0 mCi           Stress Protocol Rest HR: 69 Stress HR: 117  Rest BP: 133/92 Stress BP: 147/84  Exercise Time (min): 2:00 METS: n/a   Predicted Max HR: 155 bpm % Max HR: 75.48 bpm Rate Pressure Product: 21308   Dose of Adenosine (mg):  n/a Dose of Lexiscan: 0.4 mg  Dose of Atropine (mg): n/a Dose of Dobutamine: n/a mcg/kg/min (at max HR)  Stress Test Technologist: Smiley Houseman, CMA-N  Nuclear Technologist:  Domenic Polite, CNMT     Rest Procedure:  Myocardial perfusion imaging was performed at rest 45 minutes following the  intravenous administration of Technetium 68m Tetrofosmin.  Rest ECG: ? Prior SWMI.  Stress Procedure:  The patient received IV Lexiscan 0.4 mg over 15-seconds with concurrent low level exercise and then Technetium 74m Tetrofosmin was injected at 30-seconds while the patient continued walking one more minute. There were no significant changes with Lexiscan. Minor nonspecific T-wave changes were noted in recovery.  Quantitative spect images were obtained after a 45-minute delay.  Stress ECG: No significant ST segment change suggestive of ischemia.  QPS Raw Data Images:  Acquisition technically good; normal left ventricular size. Stress Images:  Normal homogeneous uptake in all areas of the myocardium. Rest Images:  Normal homogeneous uptake in all areas of the myocardium. Subtraction (SDS):  No evidence of ischemia. Transient Ischemic Dilatation (Normal <1.22):  1.14 Lung/Heart Ratio (Normal <0.45):  0.28  Quantitative Gated Spect Images QGS EDV:  61 ml QGS ESV:  17 ml  Impression Exercise Capacity:  Lexiscan with low level exercise. BP Response:  Normal blood pressure response. Clinical Symptoms:  There is dyspnea. ECG Impression:  No significant ST segment change suggestive of ischemia. Comparison with Prior Nuclear Study: No images to compare  Overall Impression:  Normal stress nuclear study.  LV Ejection Fraction: 73%.  LV Wall Motion:  NL LV Function; NL Wall Motion  Olga Millers

## 2011-04-29 ENCOUNTER — Other Ambulatory Visit: Payer: Self-pay | Admitting: *Deleted

## 2011-04-29 NOTE — Telephone Encounter (Signed)
Left message on machine for pt to return my call  

## 2011-04-29 NOTE — Telephone Encounter (Signed)
Notified pt that generic glucometer rx and med list has been placed at front desk for pick up.

## 2011-04-29 NOTE — Telephone Encounter (Addendum)
Please fax hand written rx for glucometer and test strips.  Also, pls notify pt that her stress test was normal.

## 2011-04-29 NOTE — Telephone Encounter (Signed)
Received message from pt that she has never had a glucometer and is requesting new Rx be sent to [facility] for a glucometer & test strips (did not specify type). Please advise.

## 2011-05-01 ENCOUNTER — Telehealth: Payer: Self-pay | Admitting: Family

## 2011-05-02 NOTE — Telephone Encounter (Signed)
Pt is requesting stress test result, please advise.

## 2011-05-02 NOTE — Telephone Encounter (Signed)
Please let pt know that her stress test is normal.

## 2011-05-05 NOTE — Telephone Encounter (Signed)
Pt.notified

## 2011-05-16 ENCOUNTER — Other Ambulatory Visit: Payer: Self-pay | Admitting: Family

## 2011-05-16 NOTE — Telephone Encounter (Signed)
Rx refill sent to pharmacy. 

## 2011-05-23 ENCOUNTER — Ambulatory Visit (INDEPENDENT_AMBULATORY_CARE_PROVIDER_SITE_OTHER): Payer: Medicare Other | Admitting: Family

## 2011-05-23 ENCOUNTER — Encounter: Payer: Self-pay | Admitting: Family

## 2011-05-23 ENCOUNTER — Telehealth: Payer: Self-pay | Admitting: *Deleted

## 2011-05-23 ENCOUNTER — Ambulatory Visit (HOSPITAL_BASED_OUTPATIENT_CLINIC_OR_DEPARTMENT_OTHER)
Admission: RE | Admit: 2011-05-23 | Discharge: 2011-05-23 | Disposition: A | Discharge status: Home / Self Care | Payer: Medicare Other | Source: Ambulatory Visit | Attending: Family | Admitting: Family

## 2011-05-23 VITALS — BP 116/80 | HR 70 | Temp 97.8°F | Resp 16 | Wt 185.0 lb

## 2011-05-23 DIAGNOSIS — R0789 Other chest pain: Secondary | ICD-10-CM | POA: Insufficient documentation

## 2011-05-23 DIAGNOSIS — R079 Chest pain, unspecified: Secondary | ICD-10-CM | POA: Diagnosis not present

## 2011-05-23 DIAGNOSIS — W19XXXA Unspecified fall, initial encounter: Secondary | ICD-10-CM

## 2011-05-23 DIAGNOSIS — S20219A Contusion of unspecified front wall of thorax, initial encounter: Secondary | ICD-10-CM | POA: Insufficient documentation

## 2011-05-23 DIAGNOSIS — M25511 Pain in right shoulder: Secondary | ICD-10-CM

## 2011-05-23 DIAGNOSIS — M25519 Pain in unspecified shoulder: Secondary | ICD-10-CM | POA: Diagnosis not present

## 2011-05-23 MED ORDER — OXYCODONE-ACETAMINOPHEN 5-325 MG PO TABS: 1.0000 | ORAL_TABLET | Freq: Four times a day (QID) | ORAL | Status: DC | PRN

## 2011-05-23 NOTE — Telephone Encounter (Signed)
Received call from pt stating she tripped over her comforter this morning and fell into her door. Has rib pain with walking and breathing. Scheduled pt appt for 11:30 this morning and advised her to be seen in the ER if symptoms worsen or difficulty breathing. Pt voices understanding.

## 2011-05-23 NOTE — Assessment & Plan Note (Signed)
She reports severe pain.  X-ray neg for rib fracture. Rx provided for percocet.  Told pt that when she completes this rx, she will need to transition to tylenol or aleve.

## 2011-05-23 NOTE — Progress Notes (Signed)
Subjective:    Patient ID: Kimberly Lin, female    DOB: May 19, 1945, 66 y.o.   MRN: [id number]  HPI  Ms.  Lin is a 66 yr old female who presents today with chief complaint of rib pain.  She reports that she tried to get out of bed this AM around 4AM and got her foot caught in the comforter.  She tripped and fell into a book case and door knob, striking beneath her left breast.  Since that time she has had severe rib pain which is worsened by movement and breathing. Review of Systems  See HPI    Past Medical History  Diagnosis Date  . Arthritis     osteoarthritis  . Fibromyalgia   . History of chicken pox   . Depression   . Diabetes mellitus   . GERD (gastroesophageal reflux disease)   . Hyperlipidemia   . Hypertension   . Migraine   . Personal history of colonic polyps   . History of septic shock 12/2009  . Spinal stenosis   . OSA (obstructive sleep apnea) 03/12/2011    History   Social History  . Marital Status: Married    Spouse Name: N/A    Number of Children: 2  . Years of Education: N/A   Occupational History  . Not on file.   Social History Main Topics  . Smoking status: Former Games developer  . Smokeless tobacco: Never Used   Comment: Smoked socially.  . Alcohol Use: 3.6 - 4.8 oz/week    6-8 Glasses of wine per week  . Drug Use: No  . Sexually Active: Not on file   Other Topics Concern  . Not on file   Social History Narrative   Regular exercise:  NoCaffeine use: 1 cup daily    Past Surgical History  Procedure Date  . Breast biopsy 2007, 2009    right breast  . Appendectomy 1983  . Joint replacement 2006    bilateral partial knee replacement  . Cystectomy 2008    Pt states she had a ?cyst removed ?ovaries    Family History  Problem Relation Age of Onset  . Cancer Mother     breast and lung  . Arthritis Mother   . Hyperlipidemia Mother   . Hypertension Mother   . Diabetes Mother   . Heart disease Mother   . Cancer Brother     lung,  prostate  . Heart disease Brother   . Hypertension Brother   . Hyperlipidemia Brother   . Diabetes Brother   . Sudden death Father   . Diabetes Sister   . Hyperlipidemia Sister   . Hypertension Sister   . Diabetes Maternal Aunt   . Heart attack Maternal Aunt   . Hyperlipidemia Maternal Aunt   . Hypertension Maternal Aunt   . Hypertension Maternal Uncle   . Hyperlipidemia Maternal Uncle   . Heart attack Maternal Uncle   . Diabetes Maternal Uncle   . Diabetes Paternal Aunt   . Heart attack Paternal Aunt   . Hyperlipidemia Paternal Aunt   . Hypertension Paternal Aunt   . Hypertension Paternal Uncle   . Hyperlipidemia Paternal Uncle   . Heart attack Paternal Uncle   . Diabetes Paternal Uncle     No Known Allergies  Current Outpatient Prescriptions on File Prior to Visit  Medication Sig Dispense Refill  . ALPRAZolam (XANAX) 1 MG tablet TAKE ONE TABLET BY MOUTH AT BEDTIME AS NEEDED FOR SLEEP  30 tablet  0  . aspirin 81 MG tablet Take 81 mg by mouth daily.        Marland Kitchen BENICAR 20 MG tablet TAKE ONE TABLET BY MOUTH EVERY DAY  30 each  1  . Calcium Carbonate-Vitamin D (CALCIUM-D) 600-400 MG-UNIT TABS Take 1 tablet by mouth daily.       . Cholecalciferol (D-3-5) 5000 UNITS capsule Take 5,000 Units by mouth daily.        Marland Kitchen co-enzyme Q-10 30 MG capsule Take 100 mg by mouth daily.        . CYMBALTA 60 MG capsule TAKE ONE CAPSULE BY MOUTH EVERY DAY  30 each  2  . diphenhydrAMINE (BENADRYL) 25 MG tablet Take 25 mg by mouth every 6 (six) hours as needed.      Marland Kitchen losartan (COZAAR) 25 MG tablet Take 1 tablet (25 mg total) by mouth daily.  30 tablet  2  . meloxicam (MOBIC) 15 MG tablet Take 1 tablet (15 mg total) by mouth daily. With food.  30 tablet  1  . metFORMIN (GLUCOPHAGE) 500 MG tablet TAKE TWO TABLETS BY MOUTH EVERY DAY TWICE DAILY WITH A MEAL  120 tablet  0  . metoprolol (LOPRESSOR) 50 MG tablet Take 0.5 tablets (25 mg total) by mouth 2 (two) times daily. Take 1/2 tablet twice a day.  30  tablet  3  . Multiple Vitamin (MULTIVITAMIN) tablet Take 1 tablet by mouth daily.        . Omega-3 Fatty Acids (FISH OIL) 1000 MG CAPS 2 caps by mouth twice daily    0  . pantoprazole (PROTONIX) 40 MG tablet Take 1 tablet (40 mg total) by mouth daily.  30 tablet  5  . Pitavastatin Calcium (LIVALO) 2 MG TABS Take 1 tablet (2 mg total) by mouth daily.  14 tablet  0  . zolpidem (AMBIEN) 5 MG tablet 1-2 tablets at bedtime as needed for sleep.  30 tablet  0    BP 116/80  Pulse 70  Temp(Src) 97.8 F (36.6 C) (Oral)  Resp 16  Wt 185 lb (83.915 kg)  SpO2 98%    Objective:   Physical Exam  Constitutional: She appears well-developed and well-nourished. No distress.  Cardiovascular: Normal rate and regular rhythm.   No murmur heard. Pulmonary/Chest: Effort normal and breath sounds normal. No respiratory distress. She has no wheezes. She has no rales. She exhibits no tenderness.  Musculoskeletal:       Tenderness beneath left breast, no bruising or swelling is noted.    Psychiatric: She has a normal mood and affect. Her behavior is normal. Judgment and thought content normal.          Assessment & Plan:

## 2011-05-23 NOTE — Patient Instructions (Signed)
Please complete your x-ray prior to leaving today.  Keep your follow up appointment in May as scheduled.

## 2011-05-29 ENCOUNTER — Other Ambulatory Visit: Payer: Self-pay | Admitting: *Deleted

## 2011-05-29 MED ORDER — PITAVASTATIN CALCIUM 2 MG PO TABS: 1.0000 | ORAL_TABLET | Freq: Every day | ORAL | Status: DC

## 2011-05-29 NOTE — Telephone Encounter (Signed)
Left message on voicemail that samples are at the front desk for pick up and to call if any questions.

## 2011-06-02 ENCOUNTER — Other Ambulatory Visit: Payer: Self-pay | Admitting: Family

## 2011-06-02 NOTE — Telephone Encounter (Signed)
Refills sent to pharmacy for losartan 25mg  #30 x 1 refill via eRx, zolpidem 5mg  1-2 tablets at bedtime as needed #30 x no refills left on pharmacy voicemail.

## 2011-06-10 ENCOUNTER — Telehealth: Payer: Self-pay | Admitting: *Deleted

## 2011-06-10 MED ORDER — LIDOCAINE 5 % EX PTCH: 1.0000 | MEDICATED_PATCH | CUTANEOUS | Status: AC

## 2011-06-10 NOTE — Telephone Encounter (Signed)
Received message from pt that she is still in a lot of pain from her rib injury. States she has tried advil, ibuprofen, aleve and tylenol without relief of pain. Having difficulty sleeping as she can't lay on her side due to the pain. Pt wants to know what else she can try?  Please advise.

## 2011-06-10 NOTE — Telephone Encounter (Signed)
Notified pt. 

## 2011-06-10 NOTE — Telephone Encounter (Signed)
She can try lidoderm patch as needed.

## 2011-06-11 ENCOUNTER — Telehealth: Payer: Self-pay | Admitting: *Deleted

## 2011-06-11 NOTE — Telephone Encounter (Signed)
Notified pt and she voices understanding. 

## 2011-06-11 NOTE — Telephone Encounter (Signed)
Unfortunately at this point, I don't have much more to offer in terms of pain medication other than tylenol or lidoderm patch.  There was no rib fracture on x-ray.  I think she had bruising of her rib cage and this should continue to improve on its own.

## 2011-06-11 NOTE — Telephone Encounter (Signed)
Received message from pt that the lidoderm patches will cost her $35 and she is unable to afford that. She wanted to know if there was a cheaper alternative. Please advise.

## 2011-06-14 ENCOUNTER — Other Ambulatory Visit: Payer: Self-pay | Admitting: Family

## 2011-06-17 ENCOUNTER — Ambulatory Visit (INDEPENDENT_AMBULATORY_CARE_PROVIDER_SITE_OTHER): Payer: Medicare Other | Admitting: Family

## 2011-06-17 ENCOUNTER — Encounter: Payer: Self-pay | Admitting: Family

## 2011-06-17 VITALS — BP 118/78 | HR 73 | Temp 97.8°F | Resp 16 | Ht 63.5 in | Wt 186.0 lb

## 2011-06-17 DIAGNOSIS — R0600 Dyspnea, unspecified: Secondary | ICD-10-CM

## 2011-06-17 DIAGNOSIS — E119 Type 2 diabetes mellitus without complications: Secondary | ICD-10-CM

## 2011-06-17 DIAGNOSIS — E785 Hyperlipidemia, unspecified: Secondary | ICD-10-CM

## 2011-06-17 DIAGNOSIS — R635 Abnormal weight gain: Secondary | ICD-10-CM | POA: Diagnosis not present

## 2011-06-17 DIAGNOSIS — R0609 Other forms of dyspnea: Secondary | ICD-10-CM | POA: Diagnosis not present

## 2011-06-17 DIAGNOSIS — R0989 Other specified symptoms and signs involving the circulatory and respiratory systems: Secondary | ICD-10-CM

## 2011-06-17 LAB — TSH: TSH: 2.199 u[IU]/mL (ref 0.350–4.500)

## 2011-06-17 LAB — HEMOGLOBIN A1C
Hgb A1c MFr Bld: 6.9 % — ABNORMAL HIGH (ref <5.7)
Mean Plasma Glucose: 151 mg/dL — ABNORMAL HIGH (ref <117)

## 2011-06-17 LAB — LIPID PANEL: LDL Cholesterol: 110 mg/dL — ABNORMAL HIGH (ref 0–99)

## 2011-06-17 NOTE — Assessment & Plan Note (Signed)
Stress test was normal.  Reports that this is improved.  Monitor.

## 2011-06-17 NOTE — Assessment & Plan Note (Addendum)
Clinically stable.  Obtain A1C. Continue metformin.

## 2011-06-17 NOTE — Assessment & Plan Note (Signed)
Obtain TSH. Encouraged ongoing diet efforts and that she try to resume an exercise routine.

## 2011-06-17 NOTE — Progress Notes (Signed)
Subjective:    Patient ID: Kimberly Lin, female    DOB: 06-01-1945, 66 y.o.   MRN: [id number]  HPI  Kimberly Lin is a 66 yr old female who presents today for follow up.  DM2- did not yet get machine. She will call us with name of husband's machine so that we can send strips and lancets to her pharm.   Weight gain- reports that she has not been able to exercise regularly due to rib pain (which is now improving).  She reports that she is maintaining a 1200 calorie diet.  Upset that she has not lost weight. In fact, she has gained 1 pound since last visit.  DOE-  Improved.       Review of Systems See HPI  Past Medical History  Diagnosis Date  . Arthritis     osteoarthritis  . Fibromyalgia   . History of chicken pox   . Depression   . Diabetes mellitus   . GERD (gastroesophageal reflux disease)   . Hyperlipidemia   . Hypertension   . Migraine   . Personal history of colonic polyps   . History of septic shock 12/2009  . Spinal stenosis   . OSA (obstructive sleep apnea) 03/12/2011    History   Social History  . Marital Status: Married    Spouse Name: N/A    Number of Children: 2  . Years of Education: N/A   Occupational History  . Not on file.   Social History Main Topics  . Smoking status: Former Games developer  . Smokeless tobacco: Never Used   Comment: Smoked socially.  . Alcohol Use: 3.6 - 4.8 oz/week    6-8 Glasses of wine per week  . Drug Use: No  . Sexually Active: Not on file   Other Topics Concern  . Not on file   Social History Narrative   Regular exercise:  NoCaffeine use: 1 cup daily    Past Surgical History  Procedure Date  . Breast biopsy 2007, 2009    right breast  . Appendectomy 1983  . Joint replacement 2006    bilateral partial knee replacement  . Cystectomy 2008    Pt states she had a ?cyst removed ?ovaries    Family History  Problem Relation Age of Onset  . Cancer Mother     breast and lung  . Arthritis Mother   . Hyperlipidemia  Mother   . Hypertension Mother   . Diabetes Mother   . Heart disease Mother   . Cancer Brother     lung, prostate  . Heart disease Brother   . Hypertension Brother   . Hyperlipidemia Brother   . Diabetes Brother   . Sudden death Father   . Diabetes Sister   . Hyperlipidemia Sister   . Hypertension Sister   . Diabetes Maternal Aunt   . Heart attack Maternal Aunt   . Hyperlipidemia Maternal Aunt   . Hypertension Maternal Aunt   . Hypertension Maternal Uncle   . Hyperlipidemia Maternal Uncle   . Heart attack Maternal Uncle   . Diabetes Maternal Uncle   . Diabetes Paternal Aunt   . Heart attack Paternal Aunt   . Hyperlipidemia Paternal Aunt   . Hypertension Paternal Aunt   . Hypertension Paternal Uncle   . Hyperlipidemia Paternal Uncle   . Heart attack Paternal Uncle   . Diabetes Paternal Uncle     No Known Allergies  Current Outpatient Prescriptions on File Prior to Visit  Medication Sig  Dispense Refill  . ALPRAZolam (XANAX) 1 MG tablet TAKE ONE TABLET BY MOUTH AT BEDTIME AS NEEDED FOR SLEEP  30 tablet  0  . aspirin 81 MG tablet Take 81 mg by mouth daily.        Marland Kitchen BENICAR 20 MG tablet TAKE ONE TABLET BY MOUTH EVERY DAY  30 each  1  . Calcium Carbonate-Vitamin D (CALCIUM-D) 600-400 MG-UNIT TABS Take 1 tablet by mouth daily.       . Cholecalciferol (D-3-5) 5000 UNITS capsule Take 5,000 Units by mouth daily.        Marland Kitchen co-enzyme Q-10 30 MG capsule Take 100 mg by mouth daily.        . CYMBALTA 60 MG capsule TAKE ONE CAPSULE BY MOUTH EVERY DAY  30 each  2  . diphenhydrAMINE (BENADRYL) 25 MG tablet Take 25 mg by mouth every 6 (six) hours as needed.      Marland Kitchen losartan (COZAAR) 25 MG tablet TAKE ONE TABLET BY MOUTH EVERY DAY  30 tablet  1  . metFORMIN (GLUCOPHAGE) 500 MG tablet TAKE TWO TABLETS BY MOUTH EVERY DAY TWICE DAILY WITH A MEAL  120 tablet  0  . metoprolol (LOPRESSOR) 50 MG tablet TAKE ONE-HALF TABLET BY MOUTH TWICE DAILY  30 tablet  2  . Multiple Vitamin (MULTIVITAMIN) tablet  Take 1 tablet by mouth daily.        . Omega-3 Fatty Acids (FISH OIL) 1000 MG CAPS 2 caps by mouth twice daily    0  . oxyCODONE-acetaminophen (ROXICET) 5-325 MG per tablet Take 1 tablet by mouth every 6 (six) hours as needed for pain.  20 tablet  0  . pantoprazole (PROTONIX) 40 MG tablet Take 1 tablet (40 mg total) by mouth daily.  30 tablet  5  . Pitavastatin Calcium (LIVALO) 2 MG TABS Take 1 tablet (2 mg total) by mouth daily.  28 tablet  0  . zolpidem (AMBIEN) 5 MG tablet TAKE ONE TO TWO TABLETS BY MOUTH AT BEDTIME AS NEEDED FOR SLEEP  30 tablet  0  . lidocaine (LIDODERM) 5 % Place 1 patch onto the skin daily. Remove & Discard patch within 12 hours or as directed by MD  30 patch  0  . meloxicam (MOBIC) 15 MG tablet Take 1 tablet (15 mg total) by mouth daily. With food.  30 tablet  1    BP 118/78  Pulse 73  Temp(Src) 97.8 F (36.6 C) (Oral)  Resp 16  Ht 5' 3.5" (1.613 m)  Wt 186 lb (84.369 kg)  BMI 32.43 kg/m2  SpO2 98%       Objective:   Physical Exam  Constitutional: She appears well-developed and well-nourished. No distress.  Cardiovascular: Normal rate and regular rhythm.   No murmur heard. Pulmonary/Chest: Effort normal and breath sounds normal. No respiratory distress. She has no wheezes. She has no rales. She exhibits no tenderness.  Musculoskeletal: She exhibits no edema.  Psychiatric: She has a normal mood and affect. Her behavior is normal. Judgment and thought content normal.          Assessment & Plan:

## 2011-06-17 NOTE — Patient Instructions (Signed)
Please complete your lab work prior to leaving. Follow up in 3 months.   

## 2011-06-18 ENCOUNTER — Encounter: Payer: Self-pay | Admitting: Family

## 2011-06-20 ENCOUNTER — Other Ambulatory Visit: Payer: Self-pay | Admitting: *Deleted

## 2011-06-20 ENCOUNTER — Other Ambulatory Visit: Payer: Self-pay | Admitting: Family

## 2011-06-20 MED ORDER — PITAVASTATIN CALCIUM 2 MG PO TABS: 1.0000 | ORAL_TABLET | Freq: Every day | ORAL | Status: DC

## 2011-06-20 MED ORDER — ALPRAZOLAM 1 MG PO TABS: ORAL_TABLET | ORAL | Status: DC

## 2011-06-20 NOTE — Telephone Encounter (Signed)
Zolpidem 5mg  #60 x no refills. Alprazolam 1mg  #60 x no refills. Metformin 500mg  #120 x 2 refills left on pharmacy voicemail.  Left message on cell # re: rx completion and to call if any questions.

## 2011-06-20 NOTE — Telephone Encounter (Signed)
Received call from pt requesting samples of Livalo. Left detailed message on pt's cell# that samples are at the front desk for pick up.

## 2011-06-20 NOTE — Telephone Encounter (Signed)
OK to dispense 60 of each as listed below.

## 2011-06-20 NOTE — Telephone Encounter (Signed)
Pt left message requesting quantity change on xanax and zolpidem. She states that she was told at her last office visit that we would change her quantity to #60 on both of these as her previous directions were 1-2 tablets daily on each medication. She is requesting refills on these with adjusted quantity. Please advise.

## 2011-06-25 ENCOUNTER — Telehealth: Payer: Self-pay | Admitting: *Deleted

## 2011-06-25 NOTE — Telephone Encounter (Signed)
Pls call lab and ask them to add on LFT (hyperlipidemia).

## 2011-06-25 NOTE — Telephone Encounter (Signed)
Received message from pt wanting to know if we checked her liver functions with most recent labs due to taking Livalo.  Do not see that we ordered them? Last ordered in March. When should they be checked again?

## 2011-06-25 NOTE — Telephone Encounter (Signed)
Notified pt. Pt questioned if insurance will pay for it at this time. Advised pt she will need to call her insurance to determine that. Pt states she will think about this and call back if she wants to proceed with LFTs.

## 2011-06-25 NOTE — Telephone Encounter (Signed)
Please call pt and ask her to complete LFT's in about 2 weeks (hyperlipidemia)

## 2011-06-25 NOTE — Telephone Encounter (Signed)
It is too late to add to previous labs as the blood is only held for 7 days and then discarded. This would be the 8th day. Please advise.

## 2011-07-02 ENCOUNTER — Telehealth: Payer: Self-pay | Admitting: *Deleted

## 2011-07-02 NOTE — Telephone Encounter (Signed)
Pt called stating she has vaginal discharge, odor and very slight burning and some vaginal itching x 2 days. States she has history of septic shock that originated from a kidney infection. Advised pt of need to be seen in the office. Appointment has been scheduled for Friday at 8am. Pt does not think she has had any fever. Please advise if ok to wait until Friday.

## 2011-07-02 NOTE — Telephone Encounter (Signed)
Notified pt and she voices understanding. 

## 2011-07-02 NOTE — Telephone Encounter (Signed)
OK to wait until Friday.  If symptoms worsen overnight, of if fever, will need to be seen sooner.

## 2011-07-03 ENCOUNTER — Other Ambulatory Visit: Payer: Self-pay | Admitting: Family

## 2011-07-04 ENCOUNTER — Ambulatory Visit: Payer: Medicare Other | Admitting: Family

## 2011-07-04 ENCOUNTER — Telehealth: Payer: Self-pay | Admitting: *Deleted

## 2011-07-04 NOTE — Telephone Encounter (Signed)
Refill sent to pharmacy for pantoprazole 40mg  1 daily #30 x 3 refills. Left message on pharmacy voicemail that it is too soon to refill zolpidem today. Last refill was sent to pharmacy on 06/20/11 #60. Advised pharmacy ok to dispense #60 x no refills but not to fill rx before 07/20/11 or before 30 days.

## 2011-07-04 NOTE — Telephone Encounter (Signed)
Received voice message that pt is going out of town today and needs refills on protonix and samples of Livalo. States pharmacy was supposed to have already requested refill from [place]. Attempted to reach pt and left detailed message that we received request for protonix last night and sent authorization in this a.m.  Advised pt to call re: status of Livalo as I left samples at the front desk for her on 06/20/11 (4 week supply). Should have enough until 07/18/11. No samples currently in office.

## 2011-07-09 ENCOUNTER — Telehealth: Payer: Self-pay | Admitting: Family

## 2011-07-09 MED ORDER — PITAVASTATIN CALCIUM 2 MG PO TABS: 1.0000 | ORAL_TABLET | Freq: Every day | ORAL | Status: DC

## 2011-07-09 NOTE — Telephone Encounter (Signed)
SHE IS TAKING 1 PILL PER DAY (NOT 1/2) AND SHE IS OUT.  DO YOU HAVE SOME SAMPLES

## 2011-07-09 NOTE — Telephone Encounter (Signed)
Notified pt that we have 2 boxes of 2mg  and 1 box of 4mg  samples. She will need to cut 4mg  tablet in 1/2. Pt voices understanding. Samples left at front desk for pick up. Livalo 4mg  lot [id number] exp 07/2012 x 1 box.

## 2011-07-11 ENCOUNTER — Telehealth: Payer: Self-pay | Admitting: *Deleted

## 2011-07-11 NOTE — Telephone Encounter (Signed)
I would not recommend temazepam for her because she is already on xanax and this is the same class of medication.  OK to take xanax 1-2 tabs at bedtime and one tablet in the morning.

## 2011-07-11 NOTE — Telephone Encounter (Signed)
Pt left message stating current medications for sleep are not helping. Reports that her husband takes temazepam 30mg , 2 at bedtime for sleep. She tried this and states that it worked great. She wants to know if we will call in a script for her?  Please advise.

## 2011-07-11 NOTE — Telephone Encounter (Signed)
Notified pt. 

## 2011-07-21 ENCOUNTER — Ambulatory Visit (INDEPENDENT_AMBULATORY_CARE_PROVIDER_SITE_OTHER): Payer: Medicare Other | Admitting: Family

## 2011-07-21 ENCOUNTER — Encounter: Payer: Self-pay | Admitting: Family

## 2011-07-21 ENCOUNTER — Telehealth: Payer: Self-pay | Admitting: *Deleted

## 2011-07-21 VITALS — BP 148/88 | HR 70 | Temp 98.4°F | Resp 16 | Ht 63.5 in | Wt 188.1 lb

## 2011-07-21 DIAGNOSIS — K760 Fatty (change of) liver, not elsewhere classified: Secondary | ICD-10-CM

## 2011-07-21 DIAGNOSIS — K7689 Other specified diseases of liver: Secondary | ICD-10-CM

## 2011-07-21 DIAGNOSIS — M5430 Sciatica, unspecified side: Secondary | ICD-10-CM

## 2011-07-21 DIAGNOSIS — E785 Hyperlipidemia, unspecified: Secondary | ICD-10-CM | POA: Diagnosis not present

## 2011-07-21 DIAGNOSIS — M5432 Sciatica, left side: Secondary | ICD-10-CM

## 2011-07-21 MED ORDER — MELOXICAM 7.5 MG PO TABS: 7.5000 mg | ORAL_TABLET | Freq: Every day | ORAL | Status: AC

## 2011-07-21 NOTE — Progress Notes (Signed)
Subjective:    Patient ID: Kimberly Lin, female    DOB: 1945/08/05, 66 y.o.   MRN: [id number]  HPI  Kimberly Lin is a 66 yr old female with hx of spinal stenosis who presents today with chief complaint of left low back pain x 5-6 days. She reports that pain is worsening and is radiates down the left buttock and into her toes. She has associated tingling in her feet.  She has taken a small amount of advil.     Review of Systems See HPI  Past Medical History  Diagnosis Date  . Arthritis     osteoarthritis  . Fibromyalgia   . History of chicken pox   . Depression   . Diabetes mellitus   . GERD (gastroesophageal reflux disease)   . Hyperlipidemia   . Hypertension   . Migraine   . Personal history of colonic polyps   . History of septic shock 12/2009  . Spinal stenosis   . OSA (obstructive sleep apnea) 03/12/2011    History   Social History  . Marital Status: Married    Spouse Name: N/A    Number of Children: 2  . Years of Education: N/A   Occupational History  . Not on file.   Social History Main Topics  . Smoking status: Former Games developer  . Smokeless tobacco: Never Used   Comment: Smoked socially.  . Alcohol Use: 3.6 - 4.8 oz/week    6-8 Glasses of wine per week  . Drug Use: No  . Sexually Active: Not on file   Other Topics Concern  . Not on file   Social History Narrative   Regular exercise:  NoCaffeine use: 1 cup daily    Past Surgical History  Procedure Date  . Breast biopsy 2007, 2009    right breast  . Appendectomy 1983  . Joint replacement 2006    bilateral partial knee replacement  . Cystectomy 2008    Pt states she had a ?cyst removed ?ovaries    Family History  Problem Relation Age of Onset  . Cancer Mother     breast and lung  . Arthritis Mother   . Hyperlipidemia Mother   . Hypertension Mother   . Diabetes Mother   . Heart disease Mother   . Cancer Brother     lung, prostate  . Heart disease Brother   . Hypertension Brother   .  Hyperlipidemia Brother   . Diabetes Brother   . Sudden death Father   . Diabetes Sister   . Hyperlipidemia Sister   . Hypertension Sister   . Diabetes Maternal Aunt   . Heart attack Maternal Aunt   . Hyperlipidemia Maternal Aunt   . Hypertension Maternal Aunt   . Hypertension Maternal Uncle   . Hyperlipidemia Maternal Uncle   . Heart attack Maternal Uncle   . Diabetes Maternal Uncle   . Diabetes Paternal Aunt   . Heart attack Paternal Aunt   . Hyperlipidemia Paternal Aunt   . Hypertension Paternal Aunt   . Hypertension Paternal Uncle   . Hyperlipidemia Paternal Uncle   . Heart attack Paternal Uncle   . Diabetes Paternal Uncle     No Known Allergies  Current Outpatient Prescriptions on File Prior to Visit  Medication Sig Dispense Refill  . ALPRAZolam (XANAX) 1 MG tablet One tablet by mouth in the morning and 1-2 tablets before bedtime as needed.      Marland Kitchen aspirin 81 MG tablet Take 81 mg by mouth  daily.        Marland Kitchen BENICAR 20 MG tablet TAKE ONE TABLET BY MOUTH EVERY DAY  30 each  1  . Calcium Carbonate-Vitamin D (CALCIUM-D) 600-400 MG-UNIT TABS Take 1 tablet by mouth daily.       . Cholecalciferol (D-3-5) 5000 UNITS capsule Take 5,000 Units by mouth daily.        Marland Kitchen co-enzyme Q-10 30 MG capsule Take 100 mg by mouth daily.        . CYMBALTA 60 MG capsule TAKE ONE CAPSULE BY MOUTH EVERY DAY  30 each  2  . diphenhydrAMINE (BENADRYL) 25 MG tablet Take 25 mg by mouth every 6 (six) hours as needed.      Marland Kitchen losartan (COZAAR) 25 MG tablet TAKE ONE TABLET BY MOUTH EVERY DAY  30 tablet  1  . metFORMIN (GLUCOPHAGE) 500 MG tablet TAKE TWO TABLETS BY MOUTH TWICE DAILY WITH MEALS  120 tablet  2  . metoprolol (LOPRESSOR) 50 MG tablet TAKE ONE-HALF TABLET BY MOUTH TWICE DAILY  30 tablet  2  . Multiple Vitamin (MULTIVITAMIN) tablet Take 1 tablet by mouth daily.        . Omega-3 Fatty Acids (FISH OIL) 1000 MG CAPS 2 caps by mouth twice daily    0  . pantoprazole (PROTONIX) 40 MG tablet TAKE ONE TABLET  BY MOUTH EVERY DAY  30 tablet  3  . Pitavastatin Calcium (LIVALO) 2 MG TABS Take 1 tablet (2 mg total) by mouth daily.  14 tablet  0  . zolpidem (AMBIEN) 5 MG tablet TAKE ONE TO TWO TABLETS BY MOUTH AT BEDTIME AS NEEDED FOR SLEEP  60 tablet  0    BP 148/88  Pulse 70  Temp(Src) 98.4 F (36.9 C) (Oral)  Resp 16  Ht 5' 3.5" (1.613 m)  Wt 188 lb 1.3 oz (85.313 kg)  BMI 32.79 kg/m2  SpO2 98%       Objective:   Physical Exam  Constitutional: She is oriented to person, place, and time. She appears well-developed and well-nourished. No distress.  Cardiovascular: Normal rate and regular rhythm.   No murmur heard. Pulmonary/Chest: Effort normal.  Neurological: She is alert and oriented to person, place, and time.  Reflex Scores:      Patellar reflexes are 3+ on the right side and 3+ on the left side.      Achilles reflexes are 3+ on the right side and 3+ on the left side.      Bilateral LE strength is 5/5  Skin: Skin is warm and dry.  Psychiatric: She has a normal mood and affect. Her behavior is normal. Judgment and thought content normal.          Assessment & Plan:

## 2011-07-21 NOTE — Assessment & Plan Note (Signed)
[place]  In the meantime, will plan rx with meloxicam.  Pt to call if symptoms worsen, or if no improvement in 1-2 weeks.

## 2011-07-21 NOTE — Telephone Encounter (Signed)
Pt left voice message stating she was in a lot of pain with her hip and both legs. Requested appt with [place] referral to specialist if needed. Attempted to reach pt to schedule appt for this pm and left detailed message to call back for appt.

## 2011-07-21 NOTE — Telephone Encounter (Signed)
Pt was seen in the office at 2:15 today.

## 2011-07-21 NOTE — Patient Instructions (Signed)

## 2011-07-22 ENCOUNTER — Encounter: Payer: Self-pay | Admitting: Family

## 2011-07-22 LAB — HEPATIC FUNCTION PANEL
Bilirubin, Direct: 0.1 mg/dL (ref 0.0–0.3)
Indirect Bilirubin: 0.4 mg/dL (ref 0.0–0.9)

## 2011-07-24 ENCOUNTER — Telehealth: Payer: Self-pay | Admitting: Family

## 2011-07-24 MED ORDER — OLMESARTAN MEDOXOMIL 20 MG PO TABS: 20.0000 mg | ORAL_TABLET | Freq: Every day | ORAL | Status: DC

## 2011-07-24 NOTE — Telephone Encounter (Signed)
Refill- benicar 20mg  tab. Take one tablet by mouth every day. Qty 30 last fill 5.10.13

## 2011-07-24 NOTE — Telephone Encounter (Signed)
Refill sent to pharmacy #30 x 2 refills. 

## 2011-07-30 ENCOUNTER — Telehealth: Payer: Self-pay | Admitting: *Deleted

## 2011-07-30 DIAGNOSIS — M549 Dorsalgia, unspecified: Secondary | ICD-10-CM

## 2011-07-30 MED ORDER — METHYLPREDNISOLONE 4 MG PO KIT: PACK | ORAL | Status: DC

## 2011-07-30 NOTE — Telephone Encounter (Signed)
Notified pt. She states that she cannot tolerate prednisone as it "makes her crazy", can't sleep while taking it. Pt is agreeable to proceed with referral. Thought meloxicam helped at first. Please advise if there are any other alternatives to medrol?

## 2011-07-30 NOTE — Telephone Encounter (Signed)
Received message from pt stating her back pain has not improved. Also having increased numbness in her legs and feet. Unable to sleep at night due to the pain despite ambien use.  Please advise.

## 2011-07-30 NOTE — Telephone Encounter (Signed)
OK to call in a 1 time rx of vicodin 5/500 one tablet every 6 hours as needed.  #20 no refills.  Also, could you pls cancel medrol rx? thanks

## 2011-07-30 NOTE — Telephone Encounter (Signed)
Notified pt. Rx called to voicemail at [facility] #20 x no refills. Cancelled medrol rx per Viviann Spare at [facility].

## 2011-07-30 NOTE — Telephone Encounter (Signed)
I recommend trial of medrol dose pak and referral to neurosurgery.  Medrol has been sent to [facility].

## 2011-08-05 ENCOUNTER — Telehealth: Payer: Self-pay | Admitting: *Deleted

## 2011-08-05 DIAGNOSIS — M549 Dorsalgia, unspecified: Secondary | ICD-10-CM

## 2011-08-05 MED ORDER — METHYLPREDNISOLONE 4 MG PO KIT: PACK | ORAL | Status: AC

## 2011-08-05 NOTE — Telephone Encounter (Signed)
Rx sent to [facility], awaiting call back from Dr Verlee Rossetti office re: earlier appt.

## 2011-08-05 NOTE — Telephone Encounter (Signed)
Ok to try medrol.

## 2011-08-05 NOTE — Telephone Encounter (Signed)
Pt left message that she was returning my call. Attempted to reach pt and left detailed message re: Rx completion and referral status.

## 2011-08-05 NOTE — Telephone Encounter (Signed)
Received message from pt stating she would now like to try the medrol dose pack as her back is still in a lot of pain. Pt is frustrated that neurosurgery appt is not until October.  We are contacting Dr Verlee Rossetti office to see if they can see her sooner. Is it ok to send Rx for medrol dose pack as [name] had originally recommended?

## 2011-08-06 ENCOUNTER — Other Ambulatory Visit: Payer: Self-pay | Admitting: Family

## 2011-08-07 DIAGNOSIS — IMO0002 Reserved for concepts with insufficient information to code with codable children: Secondary | ICD-10-CM | POA: Diagnosis not present

## 2011-08-07 NOTE — Telephone Encounter (Signed)
Rx refill sent to pharmacy for Losartan. Verbal refill provided on Xanax.

## 2011-08-07 NOTE — Telephone Encounter (Signed)
30

## 2011-08-12 ENCOUNTER — Other Ambulatory Visit: Payer: Self-pay | Admitting: *Deleted

## 2011-08-12 MED ORDER — PITAVASTATIN CALCIUM 4 MG PO TABS: 2.0000 mg | ORAL_TABLET | Freq: Every day | ORAL | Status: DC

## 2011-08-12 NOTE — Telephone Encounter (Signed)
Received message from pt that she needs samples of Livalo 2mg . 2 boxes Livalo 4mg  left at front desk for pt to take 1/2 tablet daily. Pt notified.

## 2011-08-18 ENCOUNTER — Other Ambulatory Visit: Payer: Self-pay | Admitting: Neurosurgery

## 2011-08-18 ENCOUNTER — Ambulatory Visit
Admission: RE | Admit: 2011-08-18 | Discharge: 2011-08-18 | Disposition: A | Discharge status: Home / Self Care | Payer: Medicare Other | Source: Ambulatory Visit | Attending: Neurosurgery | Admitting: Neurosurgery

## 2011-08-18 DIAGNOSIS — M5126 Other intervertebral disc displacement, lumbar region: Secondary | ICD-10-CM | POA: Diagnosis not present

## 2011-08-18 DIAGNOSIS — M5116 Intervertebral disc disorders with radiculopathy, lumbar region: Secondary | ICD-10-CM

## 2011-08-18 DIAGNOSIS — M47817 Spondylosis without myelopathy or radiculopathy, lumbosacral region: Secondary | ICD-10-CM | POA: Diagnosis not present

## 2011-08-18 MED ORDER — GADOBENATE DIMEGLUMINE 529 MG/ML IV SOLN
17.0000 mL | Freq: Once | INTRAVENOUS | Status: AC | PRN
  Administered 2011-08-18: 17 mL via INTRAVENOUS

## 2011-08-20 DIAGNOSIS — IMO0002 Reserved for concepts with insufficient information to code with codable children: Secondary | ICD-10-CM | POA: Diagnosis not present

## 2011-08-21 ENCOUNTER — Other Ambulatory Visit: Payer: Self-pay | Admitting: Family

## 2011-08-22 NOTE — Telephone Encounter (Signed)
Refills cozar, cymbalta, metformin sent to [facility]

## 2011-09-01 ENCOUNTER — Other Ambulatory Visit: Payer: Self-pay | Admitting: Family

## 2011-09-02 ENCOUNTER — Telehealth: Payer: Self-pay | Admitting: Family

## 2011-09-02 NOTE — Telephone Encounter (Signed)
Please call pt and verify if she is taking losartan (cozaar) or Olmesartan (benicar).  We are showing both on her list and she should only be taking one.  Thanks.

## 2011-09-02 NOTE — Telephone Encounter (Signed)
Refill left on pharmacy voicemail for zolpidem 5mg  1-2 tablets at bedtime as needed for sleep #60 x no refills.

## 2011-09-03 MED ORDER — OLMESARTAN MEDOXOMIL 40 MG PO TABS: 40.0000 mg | ORAL_TABLET | Freq: Every day | ORAL | Status: DC

## 2011-09-03 NOTE — Telephone Encounter (Signed)
Pt states she has been and is currently taking both medications for blood pressure. Please advise which one pt should stop?  Also pt wanted to let us know that she has not been happy with the lack of communication from Dr [facility] office and difficulty receiving return calls from them.

## 2011-09-03 NOTE — Telephone Encounter (Signed)
Notified pt and she voices understanding. States she just picked up a refill of Benicar and will take 2 of her current tablets until her f/u in August.

## 2011-09-03 NOTE — Telephone Encounter (Signed)
She should stop losartan.  I would like for her to continue Benicar, but increase dose to 40 mg.  We will repeat her BP at her upcoming visit on 8/7.

## 2011-09-04 ENCOUNTER — Other Ambulatory Visit: Payer: Self-pay | Admitting: Family

## 2011-09-05 NOTE — Telephone Encounter (Signed)
Refill called to [facility]. #60 x no refills.

## 2011-09-09 ENCOUNTER — Other Ambulatory Visit: Payer: Self-pay | Admitting: *Deleted

## 2011-09-09 NOTE — Telephone Encounter (Signed)
Received message from pt requesting refill of benicar with new strength be sent to her pharmacy as she is almost out of her current dose. Attempted to reach pt and left detailed message that new Rx was sent to [facility] on 09/03/11 with new strength and to contact pharmacy for refill. Advised pt to call if any questions.

## 2011-09-12 DIAGNOSIS — M5137 Other intervertebral disc degeneration, lumbosacral region: Secondary | ICD-10-CM | POA: Diagnosis not present

## 2011-09-12 DIAGNOSIS — IMO0002 Reserved for concepts with insufficient information to code with codable children: Secondary | ICD-10-CM | POA: Diagnosis not present

## 2011-09-17 ENCOUNTER — Ambulatory Visit (INDEPENDENT_AMBULATORY_CARE_PROVIDER_SITE_OTHER): Payer: Medicare Other | Admitting: Family

## 2011-09-17 ENCOUNTER — Encounter: Payer: Self-pay | Admitting: Family

## 2011-09-17 VITALS — BP 130/84 | HR 72 | Temp 98.2°F | Resp 16 | Ht 63.5 in | Wt 185.0 lb

## 2011-09-17 DIAGNOSIS — M25569 Pain in unspecified knee: Secondary | ICD-10-CM | POA: Diagnosis not present

## 2011-09-17 DIAGNOSIS — E119 Type 2 diabetes mellitus without complications: Secondary | ICD-10-CM

## 2011-09-17 DIAGNOSIS — I10 Essential (primary) hypertension: Secondary | ICD-10-CM | POA: Diagnosis not present

## 2011-09-17 DIAGNOSIS — M5430 Sciatica, unspecified side: Secondary | ICD-10-CM

## 2011-09-17 DIAGNOSIS — E669 Obesity, unspecified: Secondary | ICD-10-CM

## 2011-09-17 DIAGNOSIS — M5432 Sciatica, left side: Secondary | ICD-10-CM

## 2011-09-17 MED ORDER — PITAVASTATIN CALCIUM 2 MG PO TABS: 1.0000 | ORAL_TABLET | Freq: Every day | ORAL | Status: DC

## 2011-09-17 NOTE — Assessment & Plan Note (Signed)
Refer for diabetic eye exam. Pt instructed to check sugars bid x 1 week and contact me with readings.

## 2011-09-17 NOTE — Assessment & Plan Note (Signed)
Pt is requesting referral to local ortho.

## 2011-09-17 NOTE — Assessment & Plan Note (Signed)
BP Readings from Last 3 Encounters:  09/17/11 130/84  07/21/11 148/88  06/17/11 118/78   BP looks good on motoprolol and losartan. Continue same.

## 2011-09-17 NOTE — Assessment & Plan Note (Signed)
Pt was counseled on importance of exercise/weight loss.

## 2011-09-17 NOTE — Patient Instructions (Addendum)
You will be contact about your referral to opthalmology.  Please let us know if you have not heard back within 1 week about your referral.

## 2011-09-17 NOTE — Assessment & Plan Note (Signed)
She is now undergoing ESI's per Neurosurgery.

## 2011-09-17 NOTE — Progress Notes (Signed)
Subjective:    Patient ID: Kimberly Lin, female    DOB: May 24, 1945, 66 y.o.   MRN: [id number]  HPI  Ms.  Kimberly Lin is a 66 yr old female who presents today for follow up.    Low back pain- reports that she is working out in the pool with her grandchild.  Pt reports that she had one day of ESI. Dr. Ollen Bowl.  Pain is about the same.  This was completed last week.   HTN-   She reports that she had elevated blood pressure the day of her ESI but tells me that she did not take her AM BP meds that day.  DM2- reports sugar was 190 at Dr. Ollen Bowl office. She is not checking sugars regularly because she has not picked up a meter yet.  She was told by pharmacy to check with medicare to see which meter they cover. She has not yet done this.    Knee pain- reports hx of bilateral "partial" knee replacement. This was done up [place].  She has been having increased knee pain and requests referral to local ortho.   Review of Systems See HPI  Past Medical History  Diagnosis Date  . Arthritis     osteoarthritis  . Fibromyalgia   . History of chicken pox   . Depression   . Diabetes mellitus   . GERD (gastroesophageal reflux disease)   . Hyperlipidemia   . Hypertension   . Migraine   . Personal history of colonic polyps   . History of septic shock 12/2009  . Spinal stenosis   . OSA (obstructive sleep apnea) 03/12/2011    History   Social History  . Marital Status: Married    Spouse Name: N/A    Number of Children: 2  . Years of Education: N/A   Occupational History  . Not on file.   Social History Main Topics  . Smoking status: Former Games developer  . Smokeless tobacco: Never Used   Comment: Smoked socially.  . Alcohol Use: 3.6 - 4.8 oz/week    6-8 Glasses of wine per week  . Drug Use: No  . Sexually Active: Not on file   Other Topics Concern  . Not on file   Social History Narrative   Regular exercise:  NoCaffeine use: 1 cup daily    Past Surgical History  Procedure Date  .  Breast biopsy 2007, 2009    right breast  . Appendectomy 1983  . Joint replacement 2006    bilateral partial knee replacement  . Cystectomy 2008    Pt states she had a ?cyst removed ?ovaries    Family History  Problem Relation Age of Onset  . Cancer Mother     breast and lung  . Arthritis Mother   . Hyperlipidemia Mother   . Hypertension Mother   . Diabetes Mother   . Heart disease Mother   . Cancer Brother     lung, prostate  . Heart disease Brother   . Hypertension Brother   . Hyperlipidemia Brother   . Diabetes Brother   . Sudden death Father   . Diabetes Sister   . Hyperlipidemia Sister   . Hypertension Sister   . Diabetes Maternal Aunt   . Heart attack Maternal Aunt   . Hyperlipidemia Maternal Aunt   . Hypertension Maternal Aunt   . Hypertension Maternal Uncle   . Hyperlipidemia Maternal Uncle   . Heart attack Maternal Uncle   . Diabetes Maternal Uncle   .  Diabetes Paternal Aunt   . Heart attack Paternal Aunt   . Hyperlipidemia Paternal Aunt   . Hypertension Paternal Aunt   . Hypertension Paternal Uncle   . Hyperlipidemia Paternal Uncle   . Heart attack Paternal Uncle   . Diabetes Paternal Uncle     Allergies  Allergen Reactions  . Prednisone Other (See Comments)    "Insomnia"    Current Outpatient Prescriptions on File Prior to Visit  Medication Sig Dispense Refill  . ALPRAZolam (XANAX) 1 MG tablet TAKE ONE TABLET BY MOUTH TWICE DAILY AS NEEDED  60 tablet  0  . aspirin 81 MG tablet Take 81 mg by mouth daily.        . Calcium Carbonate-Vitamin D (CALCIUM-D) 600-400 MG-UNIT TABS Take 1 tablet by mouth daily.       . Cholecalciferol (D-3-5) 5000 UNITS capsule Take 5,000 Units by mouth daily.        Marland Kitchen co-enzyme Q-10 30 MG capsule Take 100 mg by mouth daily.        . CYMBALTA 60 MG capsule TAKE ONE CAPSULE BY MOUTH EVERY DAY  30 each  3  . diphenhydrAMINE (BENADRYL) 25 MG tablet Take 25 mg by mouth every 6 (six) hours as needed.      Marland Kitchen losartan (COZAAR)  25 MG tablet TAKE ONE TABLET BY MOUTH EVERY DAY  30 tablet  0  . meloxicam (MOBIC) 7.5 MG tablet Take 1 tablet (7.5 mg total) by mouth daily.  14 tablet  0  . metFORMIN (GLUCOPHAGE) 500 MG tablet TAKE TWO TABLETS BY MOUTH TWICE DAILY WITH  MEALS  120 tablet  6  . metoprolol (LOPRESSOR) 50 MG tablet TAKE ONE-HALF TABLET BY MOUTH TWICE DAILY  30 tablet  2  . Multiple Vitamin (MULTIVITAMIN) tablet Take 1 tablet by mouth daily.        Marland Kitchen olmesartan (BENICAR) 40 MG tablet Take 1 tablet (40 mg total) by mouth daily.  30 tablet  0  . Omega-3 Fatty Acids (FISH OIL) 1000 MG CAPS 2 caps by mouth twice daily    0  . pantoprazole (PROTONIX) 40 MG tablet TAKE ONE TABLET BY MOUTH EVERY DAY  30 tablet  3  . zolpidem (AMBIEN) 5 MG tablet TAKE ONE TO TWO TABLETS BY MOUTH AT BEDTIME AS NEEDED FOR SLEEP  60 tablet  0  . DISCONTD: Pitavastatin Calcium (LIVALO) 2 MG TABS Take 1 tablet (2 mg total) by mouth daily.  14 tablet  0  . DISCONTD: Pitavastatin Calcium 4 MG TABS Take 0.5 tablets (2 mg total) by mouth daily.  14 tablet  0  . DISCONTD: olmesartan (BENICAR) 20 MG tablet Take 1 tablet (20 mg total) by mouth daily.  30 tablet  2    BP 130/84  Pulse 72  Temp 98.2 F (36.8 C) (Oral)  Resp 16  Ht 5' 3.5" (1.613 m)  Wt 185 lb 0.6 oz (83.934 kg)  BMI 32.26 kg/m2  SpO2 98%       Objective:   Physical Exam  Constitutional: She appears well-developed and well-nourished. No distress.  HENT:  Head: Normocephalic and atraumatic.  Cardiovascular: Normal rate and regular rhythm.   No murmur heard. Pulmonary/Chest: Effort normal and breath sounds normal.  Musculoskeletal: She exhibits no edema.  Psychiatric: She has a normal mood and affect. Her behavior is normal. Judgment and thought content normal.          Assessment & Plan:  Livalo 2mg  #28 samples provided to pt.

## 2011-09-26 DIAGNOSIS — M1710 Unilateral primary osteoarthritis, unspecified knee: Secondary | ICD-10-CM | POA: Diagnosis not present

## 2011-09-29 ENCOUNTER — Other Ambulatory Visit: Payer: Self-pay | Admitting: Family

## 2011-09-30 NOTE — Telephone Encounter (Signed)
Is this ok to refill?... 09/30/11@8 :18am/LMB

## 2011-09-30 NOTE — Telephone Encounter (Signed)
OK to send #60 with zero refills pls

## 2011-10-01 NOTE — Telephone Encounter (Signed)
Called refill into pharmacy spoke with Kha/pharmacist gave approval for ambien.. 10/01/11@9 :50am/LMB

## 2011-10-02 ENCOUNTER — Other Ambulatory Visit: Payer: Self-pay | Admitting: Family

## 2011-10-03 NOTE — Telephone Encounter (Signed)
Called refill into pharmacy spoke with Maye/pharmacist gave approval. Epic updated... 10/03/11@1 :44pm/LMB

## 2011-10-03 NOTE — Telephone Encounter (Signed)
Yes #60 no refills pls.

## 2011-10-03 NOTE — Telephone Encounter (Signed)
Is this ok to refill?Marland Kitchen..10/03/11@9 :16am/LMB

## 2011-10-06 ENCOUNTER — Other Ambulatory Visit: Payer: Self-pay | Admitting: Family

## 2011-10-07 NOTE — Telephone Encounter (Signed)
Left detailed message on [facility] voicemail that alprazolam rx was called in to [facility]e on 10/03/11 #60 x no refills and to call if any questions. Current request is being denied as it appears to be a duplicate request.

## 2011-10-10 ENCOUNTER — Other Ambulatory Visit: Payer: Self-pay | Admitting: Family

## 2011-10-17 ENCOUNTER — Telehealth: Payer: Self-pay | Admitting: *Deleted

## 2011-10-17 MED ORDER — PITAVASTATIN CALCIUM 2 MG PO TABS: 1.0000 | ORAL_TABLET | Freq: Every day | ORAL | Status: DC

## 2011-10-17 NOTE — Telephone Encounter (Signed)
Signed.

## 2011-10-17 NOTE — Telephone Encounter (Signed)
Pt called requesting samples of livalo 2mg  and printed rx for pt to check on pricing with various pharmacies. Reminded pt that she was given a printed rx on 09/17/11. Pt is unsure if she still has rx. Rx printed and forwarded to Provider for signature. Livalo 2mg , #2 boxes left at front desk for pick up.

## 2011-10-21 ENCOUNTER — Encounter (HOSPITAL_BASED_OUTPATIENT_CLINIC_OR_DEPARTMENT_OTHER): Payer: Self-pay

## 2011-10-21 ENCOUNTER — Emergency Department (HOSPITAL_BASED_OUTPATIENT_CLINIC_OR_DEPARTMENT_OTHER)
Admission: EM | Admit: 2011-10-21 | Discharge: 2011-10-21 | Disposition: A | Discharge status: Home / Self Care | Payer: Medicare Other | Attending: Emergency Medicine | Admitting: Emergency Medicine

## 2011-10-21 ENCOUNTER — Emergency Department (HOSPITAL_BASED_OUTPATIENT_CLINIC_OR_DEPARTMENT_OTHER): Payer: Medicare Other

## 2011-10-21 DIAGNOSIS — K219 Gastro-esophageal reflux disease without esophagitis: Secondary | ICD-10-CM | POA: Diagnosis not present

## 2011-10-21 DIAGNOSIS — Z87891 Personal history of nicotine dependence: Secondary | ICD-10-CM | POA: Diagnosis not present

## 2011-10-21 DIAGNOSIS — Z9089 Acquired absence of other organs: Secondary | ICD-10-CM | POA: Insufficient documentation

## 2011-10-21 DIAGNOSIS — E785 Hyperlipidemia, unspecified: Secondary | ICD-10-CM | POA: Insufficient documentation

## 2011-10-21 DIAGNOSIS — R51 Headache: Secondary | ICD-10-CM | POA: Diagnosis not present

## 2011-10-21 DIAGNOSIS — R42 Dizziness and giddiness: Secondary | ICD-10-CM | POA: Diagnosis not present

## 2011-10-21 DIAGNOSIS — F329 Major depressive disorder, single episode, unspecified: Secondary | ICD-10-CM | POA: Insufficient documentation

## 2011-10-21 DIAGNOSIS — E119 Type 2 diabetes mellitus without complications: Secondary | ICD-10-CM | POA: Insufficient documentation

## 2011-10-21 DIAGNOSIS — I10 Essential (primary) hypertension: Secondary | ICD-10-CM | POA: Diagnosis not present

## 2011-10-21 DIAGNOSIS — IMO0001 Reserved for inherently not codable concepts without codable children: Secondary | ICD-10-CM | POA: Diagnosis not present

## 2011-10-21 DIAGNOSIS — F3289 Other specified depressive episodes: Secondary | ICD-10-CM | POA: Insufficient documentation

## 2011-10-21 DIAGNOSIS — R110 Nausea: Secondary | ICD-10-CM | POA: Diagnosis not present

## 2011-10-21 DIAGNOSIS — IMO0002 Reserved for concepts with insufficient information to code with codable children: Secondary | ICD-10-CM | POA: Insufficient documentation

## 2011-10-21 MED ORDER — MECLIZINE HCL 25 MG PO TABS: 25.0000 mg | ORAL_TABLET | Freq: Four times a day (QID) | ORAL | Status: AC

## 2011-10-21 MED ORDER — MECLIZINE HCL 25 MG PO TABS
25.0000 mg | ORAL_TABLET | Freq: Once | ORAL | Status: AC
  Administered 2011-10-21: 25 mg via ORAL
  Filled 2011-10-21: qty 1

## 2011-10-21 NOTE — ED Notes (Signed)
MD at bedside. 

## 2011-10-21 NOTE — ED Notes (Signed)
Fell in bedroom approx 1 month ago-struck back of head on night stand-no LOC-c/o cont'd dizziness, pain to back of head-states she was advised by PCP last week to come to ED-pt alert/oriented-NAD-steady gait to triage

## 2011-10-21 NOTE — ED Provider Notes (Signed)
History     CSN: [id number]  Arrival date & time 10/21/11  1331   First MD Initiated Contact with Patient 10/21/11 1406      Chief Complaint  Patient presents with  . Head Injury    (Consider location/radiation/quality/duration/timing/severity/associated sxs/prior treatment) Patient is a 66 y.o. female presenting with head injury. The history is provided by the patient.  Head Injury    Patient here with dizziness x1 month after hitting her head. Symptoms left wrist pain and are worse with certain movements. Does have a history of vertigo in the past and this feels similar. Denies any gait ataxia, confusion, vomiting. No medications taken for this prior to arrival. Denies any neck pain. No abdominal or chest pain. Called her Dr. was told to come in for evaluation Past Medical History  Diagnosis Date  . Arthritis     osteoarthritis  . Fibromyalgia   . History of chicken pox   . Depression   . Diabetes mellitus   . GERD (gastroesophageal reflux disease)   . Hyperlipidemia   . Hypertension   . Migraine   . Personal history of colonic polyps   . History of septic shock 12/2009  . Spinal stenosis   . OSA (obstructive sleep apnea) 03/12/2011    Past Surgical History  Procedure Date  . Breast biopsy 2007, 2009    right breast  . Appendectomy 1983  . Joint replacement 2006    bilateral partial knee replacement  . Cystectomy 2008    Pt states she had a ?cyst removed ?ovaries  . Replacement total knee     Family History  Problem Relation Age of Onset  . Cancer Mother     breast and lung  . Arthritis Mother   . Hyperlipidemia Mother   . Hypertension Mother   . Diabetes Mother   . Heart disease Mother   . Cancer Brother     lung, prostate  . Heart disease Brother   . Hypertension Brother   . Hyperlipidemia Brother   . Diabetes Brother   . Sudden death Father   . Diabetes Sister   . Hyperlipidemia Sister   . Hypertension Sister   . Diabetes Maternal Aunt   .  Heart attack Maternal Aunt   . Hyperlipidemia Maternal Aunt   . Hypertension Maternal Aunt   . Hypertension Maternal Uncle   . Hyperlipidemia Maternal Uncle   . Heart attack Maternal Uncle   . Diabetes Maternal Uncle   . Diabetes Paternal Aunt   . Heart attack Paternal Aunt   . Hyperlipidemia Paternal Aunt   . Hypertension Paternal Aunt   . Hypertension Paternal Uncle   . Hyperlipidemia Paternal Uncle   . Heart attack Paternal Uncle   . Diabetes Paternal Uncle     History  Substance Use Topics  . Smoking status: Former Games developer  . Smokeless tobacco: Never Used   Comment: Smoked socially.  . Alcohol Use: 3.6 - 4.8 oz/week    6-8 Glasses of wine per week    OB History    Grav Para Term Preterm Abortions TAB SAB Ect Mult Living                  Review of Systems  All other systems reviewed and are negative.    Allergies  Prednisone  Home Medications   Current Outpatient Rx  Name Route Sig Dispense Refill  . ALPRAZOLAM 1 MG PO TABS  TAKE ONE TABLET BY MOUTH TWICE DAILY AS NEEDED 60  tablet 0  . ASPIRIN 81 MG PO TABS Oral Take 81 mg by mouth daily.      Marland Kitchen BENICAR 40 MG PO TABS  TAKE ONE TABLET BY MOUTH EVERY DAY 30 each 2  . CALCIUM-D 600-400 MG-UNIT PO TABS Oral Take 1 tablet by mouth daily.     . CHOLECALCIFEROL 5000 UNITS PO CAPS Oral Take 5,000 Units by mouth daily.      Marland Kitchen COENZYME Q10 30 MG PO CAPS Oral Take 100 mg by mouth daily.      . CYMBALTA 60 MG PO CPEP  TAKE ONE CAPSULE BY MOUTH EVERY DAY 30 each 3  . DIPHENHYDRAMINE HCL 25 MG PO TABS Oral Take 25 mg by mouth every 6 (six) hours as needed.    Marland Kitchen LOSARTAN POTASSIUM 25 MG PO TABS  TAKE ONE TABLET BY MOUTH EVERY DAY 30 tablet 0  . MELOXICAM 7.5 MG PO TABS Oral Take 1 tablet (7.5 mg total) by mouth daily. 14 tablet 0  . METFORMIN HCL 500 MG PO TABS  TAKE TWO TABLETS BY MOUTH TWICE DAILY WITH  MEALS 120 tablet 6  . METOPROLOL TARTRATE 50 MG PO TABS  TAKE ONE-HALF TABLET BY MOUTH TWICE DAILY 30 tablet 2  .  ONE-DAILY MULTI VITAMINS PO TABS Oral Take 1 tablet by mouth daily.      Marland Kitchen FISH OIL 1000 MG PO CAPS  2 caps by mouth twice daily  0  . PANTOPRAZOLE SODIUM 40 MG PO TBEC  TAKE ONE TABLET BY MOUTH EVERY DAY 30 tablet 3  . PITAVASTATIN CALCIUM 2 MG PO TABS Oral Take 1 tablet (2 mg total) by mouth daily. 14 tablet 0    LOT [id number]  EXP 09/2013  . ZOLPIDEM TARTRATE 5 MG PO TABS  TAKE ONE TO TWO TABLETS BY MOUTH AT BEDTIME AS NEEDED FOR SLEEP 60 tablet 0    BP 148/79  Pulse 72  Temp 98.2 F (36.8 C) (Oral)  Resp 16  Ht 5\' 3"  (1.6 m)  Wt 185 lb (83.915 kg)  BMI 32.77 kg/m2  SpO2 96%  Physical Exam  Nursing note and vitals reviewed. Constitutional: She is oriented to person, place, and time. She appears well-developed and well-nourished.  Non-toxic appearance. No distress.  HENT:  Head: Normocephalic and atraumatic.  Eyes: Conjunctivae, EOM and lids are normal. Pupils are equal, round, and reactive to light.  Neck: Normal range of motion. Neck supple. No tracheal deviation present. No mass present.  Cardiovascular: Normal rate, regular rhythm and normal heart sounds.  Exam reveals no gallop.   No murmur heard. Pulmonary/Chest: Effort normal and breath sounds normal. No stridor. No respiratory distress. She has no decreased breath sounds. She has no wheezes. She has no rhonchi. She has no rales.  Abdominal: Soft. Normal appearance and bowel sounds are normal. She exhibits no distension. There is no tenderness. There is no rebound and no CVA tenderness.  Musculoskeletal: Normal range of motion. She exhibits no edema and no tenderness.  Neurological: She is alert and oriented to person, place, and time. She has normal strength. No cranial nerve deficit or sensory deficit. GCS eye subscore is 4. GCS verbal subscore is 5. GCS motor subscore is 6.  Skin: Skin is warm and dry. No abrasion and no rash noted.  Psychiatric: She has a normal mood and affect. Her speech is normal and behavior is normal.      ED Course  Procedures (including critical care time)  Labs Reviewed - No data  to display No results found.   No diagnosis found.    MDM  Patient given Antivert and she feels better. Head CT was negative. Suspect that she has vertigo and we'll discharge patient. Repeat neurological exam at time of discharge remained stable        Toy Baker, MD 10/21/11 1445

## 2011-10-28 ENCOUNTER — Telehealth: Payer: Self-pay | Admitting: *Deleted

## 2011-10-28 NOTE — Telephone Encounter (Signed)
Received call from pt stating she is now in the donut hole with prescription coverage and is not going to be able to afford Benicar and Cymbalta.  We can give samples but only have 2 boxes of cymbalta 60mg .  Please advise.

## 2011-10-29 NOTE — Telephone Encounter (Signed)
If she is unable to afford cymbalta, she can switch to citalopram 20mg  once daily . This is on [facility] plan for 4 dollars. I would like her to overlap citalopram 20mg  and cymbalta 30mg  (to wean her off) can we give her a 7 day supply of cymbalta 30mg  please?  Instead of Benicar- she can try losaartan- this should be about 30$ a month a think. Meds pended below.  She should follow up 2 weeks after switching so we can recheck bp.

## 2011-10-30 NOTE — Telephone Encounter (Signed)
Left message on home # to return my call. 

## 2011-10-31 ENCOUNTER — Telehealth: Payer: Self-pay | Admitting: Family

## 2011-10-31 MED ORDER — CITALOPRAM HYDROBROMIDE 20 MG PO TABS: 20.0000 mg | ORAL_TABLET | Freq: Every day | ORAL | Status: DC

## 2011-10-31 MED ORDER — LOSARTAN POTASSIUM 25 MG PO TABS: 50.0000 mg | ORAL_TABLET | Freq: Every day | ORAL | Status: DC

## 2011-10-31 NOTE — Telephone Encounter (Signed)
[place] her [place].  She is in the donut hole and cannot afford the livalo.

## 2011-10-31 NOTE — Telephone Encounter (Signed)
See previous phone note.  

## 2011-10-31 NOTE — Telephone Encounter (Signed)
Notified pt and she is agreeable to change medications. Rxs sent to [facility]. Pt reports that her dizziness is as bad as it was when she went to the ER. She states it comes and goes all day. She has fallen a couple of times due to the dizziness but states she is trying to be more careful with her movements. Reports dizziness occurs even when she is lying down.  Pt wants to know what else she can do for this?  Please advise.

## 2011-10-31 NOTE — Telephone Encounter (Signed)
She can continue meclizine (antivert as needed).  Continue xanax as needed (this can help with dizziness).  We can consider referring her to vestibular rehab a special physical therapy which can help with vertigo if she wants when she returns.  Let me know.

## 2011-11-03 NOTE — Telephone Encounter (Signed)
Received fax from [facility] wanting to know if pt is to continue Benicar in addition to Losartan.  Notified Kah at [facility] that Losartan is replacing Benicar and pt is aware.

## 2011-11-03 NOTE — Telephone Encounter (Signed)
Left detailed message on pt's cell # on 10/31/11 re: instructions below and to call us Monday to let us know if she wants to proceed with referral.

## 2011-11-05 ENCOUNTER — Ambulatory Visit: Payer: Medicare Other | Admitting: Family

## 2011-11-05 ENCOUNTER — Telehealth: Payer: Self-pay | Admitting: *Deleted

## 2011-11-05 NOTE — Telephone Encounter (Signed)
Noted  

## 2011-11-05 NOTE — Telephone Encounter (Signed)
Pt presented to the office requesting to see Kimberly Lin. Pt was given appt at 1:15. Brought pt to the exam room. Pt was very tearful, upset stating that she was having marital problems after 47 years. Also dealing with a recent death in the family. Pt requested samples of Livalo.  Pt had multiple questions about recent medication substitutions. Questions were answered and pt abruptly said she needed to leave. I told patient I thought she was wanting to talk with Kimberly Lin about her recent situation and pt stated "no, I have already been talking to someone all morning and I just need to leave." Pt left tearful and would not wait to speak with Provider. Samples were not given to pt yet.

## 2011-11-06 ENCOUNTER — Telehealth: Payer: Self-pay | Admitting: *Deleted

## 2011-11-06 MED ORDER — PANTOPRAZOLE SODIUM 40 MG PO TBEC: 40.0000 mg | DELAYED_RELEASE_TABLET | Freq: Every day | ORAL | Status: DC

## 2011-11-06 MED ORDER — ALPRAZOLAM 1 MG PO TABS: 1.0000 mg | ORAL_TABLET | Freq: Two times a day (BID) | ORAL | Status: DC | PRN

## 2011-11-06 MED ORDER — METOPROLOL TARTRATE 50 MG PO TABS: 25.0000 mg | ORAL_TABLET | Freq: Two times a day (BID) | ORAL | Status: DC

## 2011-11-06 MED ORDER — PITAVASTATIN CALCIUM 2 MG PO TABS: 1.0000 | ORAL_TABLET | Freq: Every day | ORAL | Status: DC

## 2011-11-06 NOTE — Telephone Encounter (Signed)
Received call from pt stating she left her medication list in the office yesterday as well as livalo samples. Pt wanted to verify rxs that will need refills. Currently needs: xanax, metoprolol and pantoprazole. Refills called to Joe at [facility] #60 x 2 refills each except xanax #60 x no refills. Livalo samples left at front desk for pick up (4mg  take 1/2 tablet daily LOT [id number] Exp 11/2013 # 21 tabs). Pt apologized for leaving the office abruptly yesterday. I offered pt referral for counseling and pt declines stating she has family support.

## 2011-11-10 ENCOUNTER — Telehealth: Payer: Self-pay | Admitting: *Deleted

## 2011-11-10 NOTE — Telephone Encounter (Signed)
Received message from pt stating she had an episode this weekend of knee pain, locking up and difficulty moving around. Pt reports that she was told previously by ortho that her ?xrays indicated all her hardware was in place from previous knee replacement.  Advised pt per Provider recommendation that she contact Dr Cleophas Dunker for further work up and recommendations. Pt voices understanding and # was provided for Dr Cleophas Dunker.

## 2011-11-17 DIAGNOSIS — M25569 Pain in unspecified knee: Secondary | ICD-10-CM | POA: Diagnosis not present

## 2011-11-21 DIAGNOSIS — M545 Low back pain, unspecified: Secondary | ICD-10-CM | POA: Diagnosis not present

## 2011-11-21 DIAGNOSIS — M5137 Other intervertebral disc degeneration, lumbosacral region: Secondary | ICD-10-CM | POA: Diagnosis not present

## 2011-11-21 DIAGNOSIS — M48061 Spinal stenosis, lumbar region without neurogenic claudication: Secondary | ICD-10-CM | POA: Diagnosis not present

## 2011-11-24 ENCOUNTER — Ambulatory Visit (INDEPENDENT_AMBULATORY_CARE_PROVIDER_SITE_OTHER): Payer: Medicare Other | Admitting: Family

## 2011-11-24 ENCOUNTER — Encounter: Payer: Self-pay | Admitting: Family

## 2011-11-24 ENCOUNTER — Encounter: Payer: Self-pay | Admitting: Gastroenterology

## 2011-11-24 VITALS — BP 132/80 | HR 56 | Temp 97.9°F | Resp 12 | Ht 63.5 in | Wt 194.0 lb

## 2011-11-24 DIAGNOSIS — Z23 Encounter for immunization: Secondary | ICD-10-CM | POA: Diagnosis not present

## 2011-11-24 DIAGNOSIS — E119 Type 2 diabetes mellitus without complications: Secondary | ICD-10-CM | POA: Diagnosis not present

## 2011-11-24 DIAGNOSIS — F329 Major depressive disorder, single episode, unspecified: Secondary | ICD-10-CM

## 2011-11-24 DIAGNOSIS — R197 Diarrhea, unspecified: Secondary | ICD-10-CM

## 2011-11-24 DIAGNOSIS — F32A Depression, unspecified: Secondary | ICD-10-CM

## 2011-11-24 DIAGNOSIS — K219 Gastro-esophageal reflux disease without esophagitis: Secondary | ICD-10-CM

## 2011-11-24 DIAGNOSIS — R42 Dizziness and giddiness: Secondary | ICD-10-CM

## 2011-11-24 DIAGNOSIS — F3289 Other specified depressive episodes: Secondary | ICD-10-CM

## 2011-11-24 MED ORDER — RANITIDINE HCL 150 MG PO CAPS: 150.0000 mg | ORAL_CAPSULE | Freq: Two times a day (BID) | ORAL | Status: DC

## 2011-11-24 NOTE — Progress Notes (Signed)
Subjective:    Patient ID: Kimberly Lin, female    DOB: [birth year], 66 y.o.   MRN: [id number]  HPI  Kimberly Lin is a 66 yr old female who presents today for follow up.    GERD- reports that this is deteriorated.  Feels like food is coming up into her throat despite use of protonix.  Dizziness- Unchanged.  Had neg CT in the ED a few weeks back and was diagnosed with vertigo.   Depression- reports "a lot of stress".  She found her husband chatting on the internet with another woman.  She reports feeling heartbroken and betrayed.  Husband has started counseling. She is interested in starting counseling for herself. She is taking citalopram.    Diarrhea- reports that she had episode of fecal incontinence.  Had to change clothes 3 x that day.  Using imodium PRN.  Reports that her dad had rare rectal cancer.   Review of Systems See HPI  Past Medical History  Diagnosis Date  . Arthritis     osteoarthritis  . Fibromyalgia   . History of chicken pox   . Depression   . Diabetes mellitus   . GERD (gastroesophageal reflux disease)   . Hyperlipidemia   . Hypertension   . Migraine   . Personal history of colonic polyps   . History of septic shock 12/2009  . Spinal stenosis   . OSA (obstructive sleep apnea) 03/12/2011    History   Social History  . Marital Status: Married    Spouse Name: N/A    Number of Children: 2  . Years of Education: N/A   Occupational History  . Not on file.   Social History Main Topics  . Smoking status: Former Games developer  . Smokeless tobacco: Never Used   Comment: Smoked socially.  . Alcohol Use: 3.6 - 4.8 oz/week    6-8 Glasses of wine per week  . Drug Use: No  . Sexually Active: Not on file   Other Topics Concern  . Not on file   Social History Narrative   Regular exercise:  NoCaffeine use: 1 cup daily    Past Surgical History  Procedure Date  . Breast biopsy 2007, 2009    right breast  . Appendectomy 1983  . Joint replacement 2006   bilateral partial knee replacement  . Cystectomy 2008    Pt states she had a ?cyst removed ?ovaries  . Replacement total knee     Family History  Problem Relation Age of Onset  . Cancer Mother     breast and lung  . Arthritis Mother   . Hyperlipidemia Mother   . Hypertension Mother   . Diabetes Mother   . Heart disease Mother   . Cancer Brother     lung, prostate  . Heart disease Brother   . Hypertension Brother   . Hyperlipidemia Brother   . Diabetes Brother   . Sudden death Father   . Diabetes Sister   . Hyperlipidemia Sister   . Hypertension Sister   . Diabetes Maternal Aunt   . Heart attack Maternal Aunt   . Hyperlipidemia Maternal Aunt   . Hypertension Maternal Aunt   . Hypertension Maternal Uncle   . Hyperlipidemia Maternal Uncle   . Heart attack Maternal Uncle   . Diabetes Maternal Uncle   . Diabetes Paternal Aunt   . Heart attack Paternal Aunt   . Hyperlipidemia Paternal Aunt   . Hypertension Paternal Aunt   . Hypertension Paternal  Uncle   . Hyperlipidemia Paternal Uncle   . Heart attack Paternal Uncle   . Diabetes Paternal Uncle     Allergies  Allergen Reactions  . Prednisone Other (See Comments)    "Insomnia"    Current Outpatient Prescriptions on File Prior to Visit  Medication Sig Dispense Refill  . ALPRAZolam (XANAX) 1 MG tablet Take 1 tablet (1 mg total) by mouth 2 (two) times daily as needed for sleep.  60 tablet  0  . aspirin 81 MG tablet Take 81 mg by mouth daily.        . Calcium Carbonate-Vitamin D (CALCIUM-D) 600-400 MG-UNIT TABS Take 1 tablet by mouth daily.       . Cholecalciferol (D-3-5) 5000 UNITS capsule Take 5,000 Units by mouth daily.        . citalopram (CELEXA) 20 MG tablet Take 1 tablet (20 mg total) by mouth daily.  30 tablet  3  . co-enzyme Q-10 30 MG capsule Take 100 mg by mouth daily.        . diphenhydrAMINE (BENADRYL) 25 MG tablet Take 25 mg by mouth every 6 (six) hours as needed.      Marland Kitchen losartan (COZAAR) 25 MG tablet Take  2 tablets (50 mg total) by mouth daily.  60 tablet  0  . meloxicam (MOBIC) 7.5 MG tablet Take 1 tablet (7.5 mg total) by mouth daily.  14 tablet  0  . metFORMIN (GLUCOPHAGE) 500 MG tablet TAKE TWO TABLETS BY MOUTH TWICE DAILY WITH  MEALS  120 tablet  6  . metoprolol (LOPRESSOR) 50 MG tablet Take 0.5 tablets (25 mg total) by mouth 2 (two) times daily.  30 tablet  2  . Multiple Vitamin (MULTIVITAMIN) tablet Take 1 tablet by mouth daily.        . Omega-3 Fatty Acids (FISH OIL) 1000 MG CAPS 2 caps by mouth twice daily    0  . pantoprazole (PROTONIX) 40 MG tablet Take 1 tablet (40 mg total) by mouth daily.  30 tablet  2  . Pitavastatin Calcium (LIVALO) 2 MG TABS Take 1 tablet (2 mg total) by mouth daily.  21 tablet  0  . zolpidem (AMBIEN) 5 MG tablet TAKE ONE TO TWO TABLETS BY MOUTH AT BEDTIME AS NEEDED FOR SLEEP  60 tablet  0  . ranitidine (ZANTAC) 150 MG capsule Take 1 capsule (150 mg total) by mouth 2 (two) times daily.  60 capsule  2    BP 132/80  Pulse 56  Temp 97.9 F (36.6 C)  Resp 12  Ht 5' 3.5" (1.613 m)  Wt 194 lb 0.6 oz (88.016 kg)  BMI 33.83 kg/m2  SpO2 99%       Objective:   Physical Exam  Constitutional: She appears well-developed and well-nourished. No distress.  Cardiovascular: Normal rate and regular rhythm.   No murmur heard. Pulmonary/Chest: Effort normal and breath sounds normal. No respiratory distress. She has no wheezes. She has no rales. She exhibits no tenderness.  Psychiatric:       Tearful upon discussion of her recent issues with her husband.           Assessment & Plan:

## 2011-11-24 NOTE — Assessment & Plan Note (Signed)
Deteriorated. Add zantac.  

## 2011-11-24 NOTE — Assessment & Plan Note (Signed)
Suspect vertigo and stress contributing.  CT head was negative. Will see how she does as stress improves.  If symptoms worsen, could consider vestibular rehab.

## 2011-11-24 NOTE — Patient Instructions (Addendum)
Please schedule a follow up appointment in 2 months. You will be contact about your referral to psychology.    Please let us know if you have not heard back within 1 week about your referral.

## 2011-11-24 NOTE — Assessment & Plan Note (Signed)
Deteriorated.  She continues prn immodium.  Will refer to GI for further evaluation.

## 2011-11-24 NOTE — Assessment & Plan Note (Signed)
Deteriorated, largely situational.  I think that therapy and time will be most helpful for her at this point.  Will refer to therapist. Continue citalopram.

## 2011-11-25 ENCOUNTER — Telehealth: Payer: Self-pay | Admitting: Family

## 2011-11-25 LAB — HEMOGLOBIN A1C
Hgb A1c MFr Bld: 7.5 % — ABNORMAL HIGH (ref <5.7)
Mean Plasma Glucose: 169 mg/dL — ABNORMAL HIGH (ref <117)

## 2011-11-25 LAB — BASIC METABOLIC PANEL
BUN: 18 mg/dL (ref 6–23)
Calcium: 9.3 mg/dL (ref 8.4–10.5)
Creat: 0.82 mg/dL (ref 0.50–1.10)

## 2011-11-25 MED ORDER — SAXAGLIPTIN HCL 2.5 MG PO TABS: 2.5000 mg | ORAL_TABLET | Freq: Every day | ORAL | Status: DC

## 2011-11-25 NOTE — Telephone Encounter (Signed)
Pls call pt and let her know that A1C is up to 7.5 (was 6.9).  We want this <7.  I would like for her to add onglyza once daily please.

## 2011-11-25 NOTE — Telephone Encounter (Signed)
Notified pt. She requests samples as she states she cannot afford any more medications at this time. Samples left at front desk for pick up.

## 2011-11-27 ENCOUNTER — Other Ambulatory Visit: Payer: Self-pay | Admitting: Family

## 2011-11-28 NOTE — Telephone Encounter (Addendum)
Rx printed and called to pharmacy left on voicemail.

## 2011-12-01 ENCOUNTER — Other Ambulatory Visit: Payer: Self-pay | Admitting: Family

## 2011-12-02 NOTE — Telephone Encounter (Signed)
Rx printed. Called rx into to [facility] on [address] left voice message. Alprazolam (Xanax) 1 mg tablet; take 1 tab po bid  As needed. #60; with no refills.

## 2011-12-04 ENCOUNTER — Other Ambulatory Visit: Payer: Self-pay | Admitting: Family

## 2011-12-05 ENCOUNTER — Ambulatory Visit (INDEPENDENT_AMBULATORY_CARE_PROVIDER_SITE_OTHER): Payer: Medicare Other | Admitting: Licensed Clinical Social Worker

## 2011-12-05 DIAGNOSIS — F4323 Adjustment disorder with mixed anxiety and depressed mood: Secondary | ICD-10-CM | POA: Diagnosis not present

## 2011-12-05 NOTE — Telephone Encounter (Signed)
Medication refill request: Ambien 5 mg tab, take 1 to 2 tablets by mouth at bedtime as needed for sleep. #60 0 rf (last refill 10/01/11). Last office visit 11/24/11. Please advise.

## 2011-12-05 NOTE — Telephone Encounter (Signed)
Caller: Marifer/Patient; Patient Name: Kimberly Lin; PCP: Peggyann Juba, [name] (Adults only); Best Callback Phone Number: [phone]. Patient was taking Effexor XL previously for depression, but had to go off the medication due to cost. She has recently found out that there is a generic form of this medication and wanted to know if Dr. Peggyann Juba would change her current medication to the generic form of Effexor XL. Patient is also unsure of why she is taking Celexa. PLEASE CALL PATIENT BACK AND LET HER KNOW IF SHE CAN SWITCH TO THE NEW MEDICATION AND HELP HER UNDERSTAND WHY SHE IS TAKING CELEXA. Thanks.

## 2011-12-05 NOTE — Telephone Encounter (Signed)
OK to [facility]enc #60 with zero refills.

## 2011-12-08 NOTE — Telephone Encounter (Signed)
Zolpidem refill left on pharmacy voicemail, #60 x no refills. Please advise re: message below.

## 2011-12-11 ENCOUNTER — Encounter: Payer: Self-pay | Admitting: Gastroenterology

## 2011-12-11 ENCOUNTER — Encounter: Payer: Self-pay | Admitting: Internal Medicine

## 2011-12-12 ENCOUNTER — Telehealth: Payer: Self-pay | Admitting: *Deleted

## 2011-12-12 MED ORDER — PITAVASTATIN CALCIUM 2 MG PO TABS: 1.0000 | ORAL_TABLET | Freq: Every day | ORAL | Status: DC

## 2011-12-12 NOTE — Telephone Encounter (Signed)
Cont citalopram. I will discuss with her ar 11/4 visit.

## 2011-12-12 NOTE — Telephone Encounter (Signed)
Received message from pt requesting refill of metoprolol and samples of Livalo. Left detailed message on cell# that she should have refills remaining from 11/06/11 rx of #30 x 2 refills. Livalo 4mg  take 1/2 tablet daily, #2 boxes left at the front desk. Advised pt to call if any questions.

## 2011-12-15 ENCOUNTER — Ambulatory Visit: Payer: Medicare Other | Admitting: Family

## 2011-12-16 NOTE — Telephone Encounter (Signed)
Spoke with pt, states Effexor worked great for her in the past but had to stop due to cost. Was told that Effexor now comes in generic and would like to try it again. Also states that she can't tell if citalopram is helping her currently due to recent increased emotional stress at home. Pt does report that Effexor did not affect her libido. Pt's f/u on 12/15/11 was r/s for December as she was seen recently and f/u was moved out.  Please advise.

## 2011-12-17 ENCOUNTER — Ambulatory Visit: Payer: Medicare Other | Admitting: Gastroenterology

## 2011-12-17 MED ORDER — VENLAFAXINE HCL 37.5 MG PO TABS: 37.5000 mg | ORAL_TABLET | Freq: Two times a day (BID) | ORAL | Status: DC

## 2011-12-17 NOTE — Telephone Encounter (Signed)
I will add effexor.  She can continue with citalopram until her follow up apt.  I will re-evaluate her at that time and if she is doing well, we can consider a taper off of her citalopram.

## 2011-12-17 NOTE — Telephone Encounter (Signed)
Notified pt and she voices understanding. 

## 2011-12-23 ENCOUNTER — Other Ambulatory Visit: Payer: Self-pay | Admitting: Family

## 2011-12-23 DIAGNOSIS — Z1231 Encounter for screening mammogram for malignant neoplasm of breast: Secondary | ICD-10-CM

## 2011-12-25 ENCOUNTER — Telehealth: Payer: Self-pay | Admitting: *Deleted

## 2011-12-25 MED ORDER — SAXAGLIPTIN HCL 2.5 MG PO TABS: 2.5000 mg | ORAL_TABLET | Freq: Every day | ORAL | Status: DC

## 2011-12-25 MED ORDER — PITAVASTATIN CALCIUM 2 MG PO TABS: 1.0000 | ORAL_TABLET | Freq: Every day | ORAL | Status: DC

## 2011-12-25 NOTE — Telephone Encounter (Signed)
Received call from pt requesting samples of Livalo and Onglyza. Advised pt samples will be left at front desk for pick up. # 2 boxes onglyza, # 2 boxes livalo 2mg .

## 2011-12-31 DIAGNOSIS — M545 Low back pain, unspecified: Secondary | ICD-10-CM | POA: Diagnosis not present

## 2012-01-05 ENCOUNTER — Ambulatory Visit (INDEPENDENT_AMBULATORY_CARE_PROVIDER_SITE_OTHER): Payer: Medicare Other | Admitting: Family

## 2012-01-05 ENCOUNTER — Encounter: Payer: Self-pay | Admitting: Family

## 2012-01-05 VITALS — BP 122/82 | HR 67 | Temp 97.8°F | Resp 16 | Ht 63.5 in | Wt 189.1 lb

## 2012-01-05 DIAGNOSIS — R42 Dizziness and giddiness: Secondary | ICD-10-CM

## 2012-01-05 DIAGNOSIS — F329 Major depressive disorder, single episode, unspecified: Secondary | ICD-10-CM | POA: Diagnosis not present

## 2012-01-05 DIAGNOSIS — F32A Depression, unspecified: Secondary | ICD-10-CM

## 2012-01-05 DIAGNOSIS — R197 Diarrhea, unspecified: Secondary | ICD-10-CM

## 2012-01-05 MED ORDER — PITAVASTATIN CALCIUM 2 MG PO TABS: 1.0000 | ORAL_TABLET | Freq: Every day | ORAL | Status: DC

## 2012-01-05 MED ORDER — SAXAGLIPTIN HCL 2.5 MG PO TABS: 2.5000 mg | ORAL_TABLET | Freq: Every day | ORAL | Status: DC

## 2012-01-05 MED ORDER — VENLAFAXINE HCL ER 75 MG PO CP24: 75.0000 mg | ORAL_CAPSULE | Freq: Every day | ORAL | Status: DC

## 2012-01-05 NOTE — Patient Instructions (Addendum)
Please follow up in January.   Cut citalopram (celexa) in half and take 10mg  once daily for the next month. Call me and let me know how you are feeling in 1 month on this dose.

## 2012-01-05 NOTE — Assessment & Plan Note (Signed)
Nearly resolved. Monitor.

## 2012-01-05 NOTE — Assessment & Plan Note (Signed)
Stable. She wishes to try to come off of citalopram.  I advised pt to cut in half and take citalopram 10 mg once daily for the next month, then call me to let me know how she if feeling. She has coupon card for effexor xr and she requests we switch her to this.

## 2012-01-05 NOTE — Assessment & Plan Note (Signed)
Improved. Monitor.  

## 2012-01-05 NOTE — Progress Notes (Signed)
Subjective:    Patient ID: Kimberly Lin, female    DOB: 08/18/1945, 66 y.o.   MRN: [id number]  HPI  Kimberly Lin is a 66 yr old female who presents today for follow up.  1) Depression- she was referred to therapist last visit. She is continued on citalopram. Better mood.  Things are going better at home.   2) Diarrhea- cancelled apt with Dr. Russella Dar.  Reports that for the most part her diarrhea has stopped.    3) Vertigo- this is improved but better.  Worse with bending over.    Review of Systems See HPI  Past Medical History  Diagnosis Date  . Arthritis     osteoarthritis  . Fibromyalgia   . History of chicken pox   . Depression   . Diabetes mellitus   . GERD (gastroesophageal reflux disease)   . Hyperlipidemia   . Hypertension   . Migraine   . Personal history of colonic polyps   . History of septic shock 12/2009  . Spinal stenosis   . OSA (obstructive sleep apnea) 03/12/2011    History   Social History  . Marital Status: Married    Spouse Name: N/A    Number of Children: 2  . Years of Education: N/A   Occupational History  . Not on file.   Social History Main Topics  . Smoking status: Former Games developer  . Smokeless tobacco: Never Used     Comment: Smoked socially.  . Alcohol Use: 3.6 - 4.8 oz/week    6-8 Glasses of wine per week  . Drug Use: No  . Sexually Active: Not on file   Other Topics Concern  . Not on file   Social History Narrative   Regular exercise:  NoCaffeine use: 1 cup daily    Past Surgical History  Procedure Date  . Breast biopsy 2007, 2009    right breast  . Appendectomy 1983  . Joint replacement 2006    bilateral partial knee replacement  . Cystectomy 2008    Pt states she had a ?cyst removed ?ovaries  . Replacement total knee     Family History  Problem Relation Age of Onset  . Cancer Mother     breast and lung  . Arthritis Mother   . Hyperlipidemia Mother   . Hypertension Mother   . Diabetes Mother   . Heart disease  Mother   . Cancer Brother     lung, prostate  . Heart disease Brother   . Hypertension Brother   . Hyperlipidemia Brother   . Diabetes Brother   . Sudden death Father   . Diabetes Sister   . Hyperlipidemia Sister   . Hypertension Sister   . Diabetes Maternal Aunt   . Heart attack Maternal Aunt   . Hyperlipidemia Maternal Aunt   . Hypertension Maternal Aunt   . Hypertension Maternal Uncle   . Hyperlipidemia Maternal Uncle   . Heart attack Maternal Uncle   . Diabetes Maternal Uncle   . Diabetes Paternal Aunt   . Heart attack Paternal Aunt   . Hyperlipidemia Paternal Aunt   . Hypertension Paternal Aunt   . Hypertension Paternal Uncle   . Hyperlipidemia Paternal Uncle   . Heart attack Paternal Uncle   . Diabetes Paternal Uncle     Allergies  Allergen Reactions  . Prednisone Other (See Comments)    "Insomnia"    Current Outpatient Prescriptions on File Prior to Visit  Medication Sig Dispense Refill  . ALPRAZolam (  XANAX) 1 MG tablet TAKE ONE TABLET BY MOUTH TWICE DAILY AS NEEDED  60 tablet  0  . aspirin 81 MG tablet Take 81 mg by mouth daily.        . Calcium Carbonate-Vitamin D (CALCIUM-D) 600-400 MG-UNIT TABS Take 1 tablet by mouth daily.       . Cholecalciferol (D-3-5) 5000 UNITS capsule Take 5,000 Units by mouth daily.        . citalopram (CELEXA) 20 MG tablet Take 1 tablet (20 mg total) by mouth daily.  30 tablet  3  . co-enzyme Q-10 30 MG capsule Take 100 mg by mouth daily.        . diphenhydrAMINE (BENADRYL) 25 MG tablet Take 25 mg by mouth every 6 (six) hours as needed.      Marland Kitchen losartan (COZAAR) 25 MG tablet Take 2 tablets (50 mg total) by mouth daily.  60 tablet  0  . meloxicam (MOBIC) 7.5 MG tablet Take 1 tablet (7.5 mg total) by mouth daily.  14 tablet  0  . metFORMIN (GLUCOPHAGE) 500 MG tablet TAKE TWO TABLETS BY MOUTH TWICE DAILY WITH  MEALS  120 tablet  6  . metoprolol (LOPRESSOR) 50 MG tablet Take 0.5 tablets (25 mg total) by mouth 2 (two) times daily.  30  tablet  2  . Multiple Vitamin (MULTIVITAMIN) tablet Take 1 tablet by mouth daily.        . Omega-3 Fatty Acids (FISH OIL) 1000 MG CAPS 2 caps by mouth twice daily    0  . pantoprazole (PROTONIX) 40 MG tablet Take 1 tablet (40 mg total) by mouth daily.  30 tablet  2  . ranitidine (ZANTAC) 150 MG capsule Take 1 capsule (150 mg total) by mouth 2 (two) times daily.  60 capsule  2  . venlafaxine (EFFEXOR) 37.5 MG tablet Take 1 tablet (37.5 mg total) by mouth 2 (two) times daily.  60 tablet  0  . zolpidem (AMBIEN) 5 MG tablet TAKE ONE TO TWO TABLETS BY MOUTH AT BEDTIME AS NEEDED FOR SLEEP  60 tablet  0  . [DISCONTINUED] Pitavastatin Calcium (LIVALO) 2 MG TABS Take 1 tablet (2 mg total) by mouth daily.  14 tablet  0  . [DISCONTINUED] saxagliptin HCl (ONGLYZA) 2.5 MG TABS tablet Take 1 tablet (2.5 mg total) by mouth daily.  14 tablet  0    BP 122/82  Pulse 67  Temp 97.8 F (36.6 C) (Oral)  Resp 16  Ht 5' 3.5" (1.613 m)  Wt 189 lb 1.3 oz (85.766 kg)  BMI 32.97 kg/m2  SpO2 97%       Objective:   Physical Exam  Constitutional: She appears well-developed and well-nourished. No distress.  HENT:  Head: Normocephalic and atraumatic.  Cardiovascular: Normal rate and regular rhythm.   No murmur heard. Pulmonary/Chest: Effort normal and breath sounds normal. No respiratory distress. She has no wheezes. She has no rales. She exhibits no tenderness.  Musculoskeletal: She exhibits no edema.  Skin: Skin is warm and dry.  Psychiatric: She has a normal mood and affect. Her behavior is normal. Judgment and thought content normal.          Assessment & Plan:

## 2012-01-09 ENCOUNTER — Other Ambulatory Visit: Payer: Self-pay | Admitting: Family

## 2012-01-09 NOTE — Telephone Encounter (Signed)
Ok #60 

## 2012-01-09 NOTE — Telephone Encounter (Signed)
Rx to pharmacy/SLS 

## 2012-01-09 NOTE — Telephone Encounter (Signed)
Zolpidem request [Last Rx 10.28.13 #60x0 Sig: 1-2 tab QHs prn sleep]/SLS Please advise.

## 2012-01-09 NOTE — Telephone Encounter (Signed)
Refill ambien 5 mg tab take 1 or two tablets by mouth at bedtime as needed for sleep  Qty 60 last fill 12-08-2011

## 2012-01-19 ENCOUNTER — Ambulatory Visit (HOSPITAL_BASED_OUTPATIENT_CLINIC_OR_DEPARTMENT_OTHER)
Admission: RE | Admit: 2012-01-19 | Discharge: 2012-01-19 | Disposition: A | Discharge status: Home / Self Care | Payer: Medicare Other | Source: Ambulatory Visit | Attending: Family | Admitting: Family

## 2012-01-19 DIAGNOSIS — Z1231 Encounter for screening mammogram for malignant neoplasm of breast: Secondary | ICD-10-CM | POA: Diagnosis not present

## 2012-01-26 ENCOUNTER — Ambulatory Visit: Payer: Medicare Other | Admitting: Family

## 2012-01-27 ENCOUNTER — Other Ambulatory Visit: Payer: Self-pay | Admitting: Family

## 2012-01-27 NOTE — Telephone Encounter (Signed)
OK. #60 with zero refills.

## 2012-01-27 NOTE — Telephone Encounter (Signed)
Rx phoned to pharmacy/SLS 

## 2012-01-27 NOTE — Telephone Encounter (Signed)
Alprazolam request [Last Rx 10.21.13 #60x0]/SLS Please advise.

## 2012-02-02 ENCOUNTER — Other Ambulatory Visit: Payer: Self-pay | Admitting: *Deleted

## 2012-02-02 MED ORDER — SAXAGLIPTIN HCL 2.5 MG PO TABS: 2.5000 mg | ORAL_TABLET | Freq: Every day | ORAL | Status: DC

## 2012-02-02 MED ORDER — PITAVASTATIN CALCIUM 2 MG PO TABS: 1.0000 | ORAL_TABLET | Freq: Every day | ORAL | Status: DC

## 2012-02-02 NOTE — Telephone Encounter (Signed)
Received message from pt on 01/29/12 that she needs samples of livalo and onglyza. Samples left at front desk for Onglyza 5mg  (take 1/2 tablet daily) #14 tablets. Livalo 4mg  (take 1/2 tablet daily) #14 tablets. Notified pt.

## 2012-02-09 ENCOUNTER — Telehealth: Payer: Self-pay | Admitting: Family

## 2012-02-09 MED ORDER — ZOLPIDEM TARTRATE 5 MG PO TABS: 5.0000 mg | ORAL_TABLET | Freq: Every evening | ORAL | Status: DC | PRN

## 2012-02-09 NOTE — Telephone Encounter (Signed)
Refill- ambien 5mg  tab. Take one to two tablets by mouth at bedtime as needed for sleep. Qty 60 last fill 11.29.13

## 2012-02-09 NOTE — Telephone Encounter (Signed)
Refill called to pharmacy voicemail; #60 x no refills.

## 2012-02-11 DIAGNOSIS — K529 Noninfective gastroenteritis and colitis, unspecified: Secondary | ICD-10-CM

## 2012-02-11 HISTORY — DX: Noninfective gastroenteritis and colitis, unspecified: K52.9

## 2012-02-11 HISTORY — PX: COLONOSCOPY: SHX174

## 2012-02-14 ENCOUNTER — Other Ambulatory Visit: Payer: Self-pay | Admitting: Family

## 2012-02-16 NOTE — Telephone Encounter (Signed)
Rx[s] to pharmacy/SLS 

## 2012-02-20 DIAGNOSIS — M545 Low back pain, unspecified: Secondary | ICD-10-CM | POA: Diagnosis not present

## 2012-02-20 DIAGNOSIS — M5137 Other intervertebral disc degeneration, lumbosacral region: Secondary | ICD-10-CM | POA: Diagnosis not present

## 2012-02-25 ENCOUNTER — Other Ambulatory Visit: Payer: Self-pay | Admitting: Family

## 2012-02-26 NOTE — Telephone Encounter (Signed)
Alprazolam request [Last Rx 12.17.13 #60x0]/SLS Please advise.

## 2012-02-26 NOTE — Telephone Encounter (Signed)
60

## 2012-02-26 NOTE — Telephone Encounter (Signed)
Rx phone to pharmacy/SLS 

## 2012-02-27 ENCOUNTER — Ambulatory Visit: Payer: Medicare Other | Admitting: Family

## 2012-03-09 ENCOUNTER — Other Ambulatory Visit: Payer: Self-pay | Admitting: Family

## 2012-03-09 MED ORDER — PITAVASTATIN CALCIUM 2 MG PO TABS: 1.0000 | ORAL_TABLET | Freq: Every day | ORAL | Status: DC

## 2012-03-09 MED ORDER — SAXAGLIPTIN HCL 2.5 MG PO TABS: 2.5000 mg | ORAL_TABLET | Freq: Every day | ORAL | Status: DC

## 2012-03-09 NOTE — Telephone Encounter (Signed)
Received call from pt requesting samples of onglyza and livalo. Samples placed at front desk for pt to pick up. Pt is due for follow up this month. Refill zolpidem 5mg  1-2 qhs, #60 x no refills left on [facility] voicemail. Pt is due for follow up now, call transferred to front office to arrange appt.

## 2012-03-10 NOTE — Telephone Encounter (Signed)
Zolpidem request denied and message sent to pharmacy to use refill called in on 03/09/12, #60 x no refills.

## 2012-03-18 ENCOUNTER — Ambulatory Visit (INDEPENDENT_AMBULATORY_CARE_PROVIDER_SITE_OTHER): Payer: Medicare Other | Admitting: Family

## 2012-03-18 ENCOUNTER — Encounter: Payer: Self-pay | Admitting: Family

## 2012-03-18 VITALS — BP 120/84 | HR 77 | Temp 98.0°F | Resp 16 | Ht 63.5 in | Wt 191.0 lb

## 2012-03-18 DIAGNOSIS — R109 Unspecified abdominal pain: Secondary | ICD-10-CM

## 2012-03-18 DIAGNOSIS — G4700 Insomnia, unspecified: Secondary | ICD-10-CM

## 2012-03-18 DIAGNOSIS — F329 Major depressive disorder, single episode, unspecified: Secondary | ICD-10-CM | POA: Diagnosis not present

## 2012-03-18 DIAGNOSIS — R1031 Right lower quadrant pain: Secondary | ICD-10-CM

## 2012-03-18 DIAGNOSIS — F3289 Other specified depressive episodes: Secondary | ICD-10-CM

## 2012-03-18 DIAGNOSIS — F32A Depression, unspecified: Secondary | ICD-10-CM

## 2012-03-18 LAB — BASIC METABOLIC PANEL
CO2: 26 mEq/L (ref 19–32)
Calcium: 9.5 mg/dL (ref 8.4–10.5)
Creat: 0.82 mg/dL (ref 0.50–1.10)

## 2012-03-18 MED ORDER — ZOLPIDEM TARTRATE 10 MG PO TABS: 10.0000 mg | ORAL_TABLET | Freq: Every evening | ORAL | Status: DC | PRN

## 2012-03-18 NOTE — Assessment & Plan Note (Signed)
Rx for ambien changed from 5 mg to 10 mg tabs.

## 2012-03-18 NOTE — Assessment & Plan Note (Signed)
Obtain CT abd/pelvis to further evaluate, check serum creatinine first.

## 2012-03-18 NOTE — Patient Instructions (Addendum)
Please complete your blood work prior to leaving.  Follow up in 3 months. 

## 2012-03-18 NOTE — Assessment & Plan Note (Signed)
Stable on citalopram, continue same.  

## 2012-03-18 NOTE — Progress Notes (Signed)
Kimberly Lin is a 67 yr old female who presents today for follow up.    Depression- Reports that depression is well controlled on citalopram.    Abdominal pain- reports that she has sharp pain in the right lower abdomen.  She has hx of cyst removed from this area.    Insomnia- requests rx for zolpidem be changed to 10 mg once daily due to quantity limit.     ROS  See HPI  Past Medical History  Diagnosis Date  . Arthritis     osteoarthritis  . Fibromyalgia   . History of chicken pox   . Depression   . Diabetes mellitus   . GERD (gastroesophageal reflux disease)   . Hyperlipidemia   . Hypertension   . Migraine   . Personal history of colonic polyps   . History of septic shock 12/2009  . Spinal stenosis   . OSA (obstructive sleep apnea) 03/12/2011    History   Social History  . Marital Status: Married    Spouse Name: N/A    Number of Children: 2  . Years of Education: N/A   Occupational History  . Not on file.   Social History Main Topics  . Smoking status: Former Games developer  . Smokeless tobacco: Never Used     Comment: Smoked socially.  . Alcohol Use: 3.6 - 4.8 oz/week    6-8 Glasses of wine per week  . Drug Use: No  . Sexually Active: Not on file   Other Topics Concern  . Not on file   Social History Narrative   Regular exercise:  NoCaffeine use: 1 cup daily    Past Surgical History  Procedure Date  . Breast biopsy 2007, 2009    right breast  . Appendectomy 1983  . Joint replacement 2006    bilateral partial knee replacement  . Cystectomy 2008    Pt states she had a ?cyst removed ?ovaries  . Replacement total knee     Family History  Problem Relation Age of Onset  . Cancer Mother     breast and lung  . Arthritis Mother   . Hyperlipidemia Mother   . Hypertension Mother   . Diabetes Mother   . Heart disease Mother   . Cancer Brother     lung, prostate  . Heart disease Brother   . Hypertension Brother   . Hyperlipidemia Brother   . Diabetes  Brother   . Sudden death Father   . Diabetes Sister   . Hyperlipidemia Sister   . Hypertension Sister   . Diabetes Maternal Aunt   . Heart attack Maternal Aunt   . Hyperlipidemia Maternal Aunt   . Hypertension Maternal Aunt   . Hypertension Maternal Uncle   . Hyperlipidemia Maternal Uncle   . Heart attack Maternal Uncle   . Diabetes Maternal Uncle   . Diabetes Paternal Aunt   . Heart attack Paternal Aunt   . Hyperlipidemia Paternal Aunt   . Hypertension Paternal Aunt   . Hypertension Paternal Uncle   . Hyperlipidemia Paternal Uncle   . Heart attack Paternal Uncle   . Diabetes Paternal Uncle     Allergies  Allergen Reactions  . Prednisone Other (See Comments)    "Insomnia"    Current Outpatient Prescriptions on File Prior to Visit  Medication Sig Dispense Refill  . ALPRAZolam (XANAX) 1 MG tablet TAKE ONE TABLET BY MOUTH TWICE DAILY AS NEEDED  60 tablet  0  . aspirin 81 MG tablet Take 81  mg by mouth daily.        . Calcium Carbonate-Vitamin D (CALCIUM-D) 600-400 MG-UNIT TABS Take 1 tablet by mouth daily.       . Cholecalciferol (D-3-5) 5000 UNITS capsule Take 5,000 Units by mouth daily.        Marland Kitchen co-enzyme Q-10 30 MG capsule Take 100 mg by mouth daily.        . diphenhydrAMINE (BENADRYL) 25 MG tablet Take 25 mg by mouth every 6 (six) hours as needed.      Marland Kitchen losartan (COZAAR) 25 MG tablet Take 2 tablets (50 mg total) by mouth daily.  60 tablet  0  . meloxicam (MOBIC) 7.5 MG tablet Take 1 tablet (7.5 mg total) by mouth daily.  14 tablet  0  . metFORMIN (GLUCOPHAGE) 500 MG tablet TAKE TWO TABLETS BY MOUTH TWICE DAILY WITH  MEALS  120 tablet  6  . metoprolol (LOPRESSOR) 50 MG tablet TAKE ONE-HALF TABLET BY MOUTH TWICE DAILY  30 tablet  5  . Multiple Vitamin (MULTIVITAMIN) tablet Take 1 tablet by mouth daily.        . Omega-3 Fatty Acids (FISH OIL) 1000 MG CAPS 2 caps by mouth twice daily    0  . pantoprazole (PROTONIX) 40 MG tablet TAKE ONE TABLET BY MOUTH EVERY DAY  30 tablet  5   . Pitavastatin Calcium (LIVALO) 2 MG TABS Take 1 tablet (2 mg total) by mouth daily.  21 tablet  0  . saxagliptin HCl (ONGLYZA) 2.5 MG TABS tablet Take 1 tablet (2.5 mg total) by mouth daily.  14 tablet  0  . venlafaxine XR (EFFEXOR XR) 75 MG 24 hr capsule Take 1 capsule (75 mg total) by mouth daily.  30 capsule  2  . zolpidem (AMBIEN) 10 MG tablet Take 1 tablet (10 mg total) by mouth at bedtime as needed for sleep.  30 tablet  0    BP 120/84  Pulse 77  Temp 98 F (36.7 C) (Oral)  Resp 16  Ht 5' 3.5" (1.613 m)  Wt 191 lb (86.637 kg)  BMI 33.30 kg/m2  SpO2 97%  Gen: Awake, alert, NAD CV: S1/S2 RRR Resp: BS CTA bilaterally without W/R/R Abdomen: mild abdominal tenderness in the right lower quadrant without rebound or guarding Psych: calm, pleasant,appropriate, A and O x 3.

## 2012-03-19 ENCOUNTER — Encounter (HOSPITAL_BASED_OUTPATIENT_CLINIC_OR_DEPARTMENT_OTHER): Payer: Self-pay

## 2012-03-19 ENCOUNTER — Ambulatory Visit (HOSPITAL_BASED_OUTPATIENT_CLINIC_OR_DEPARTMENT_OTHER)
Admission: RE | Admit: 2012-03-19 | Discharge: 2012-03-19 | Disposition: A | Discharge status: Home / Self Care | Payer: Medicare Other | Source: Ambulatory Visit | Attending: Family | Admitting: Family

## 2012-03-19 DIAGNOSIS — K7689 Other specified diseases of liver: Secondary | ICD-10-CM | POA: Diagnosis not present

## 2012-03-19 DIAGNOSIS — R1032 Left lower quadrant pain: Secondary | ICD-10-CM | POA: Insufficient documentation

## 2012-03-19 DIAGNOSIS — R1031 Right lower quadrant pain: Secondary | ICD-10-CM | POA: Insufficient documentation

## 2012-03-19 DIAGNOSIS — K5730 Diverticulosis of large intestine without perforation or abscess without bleeding: Secondary | ICD-10-CM | POA: Insufficient documentation

## 2012-03-19 MED ORDER — IOHEXOL 300 MG/ML  SOLN
100.0000 mL | Freq: Once | INTRAMUSCULAR | Status: AC | PRN
  Administered 2012-03-19: 100 mL via INTRAVENOUS

## 2012-03-24 ENCOUNTER — Telehealth: Payer: Self-pay | Admitting: *Deleted

## 2012-03-24 NOTE — Telephone Encounter (Signed)
Received fax from [facility] that Zolpidem tartrate 5mg  is subject to quantity limits. Pt was previously taking 1-2 tablets at bedtime. Insurance will cover 10mg  at bedtime. Rx was changed to 10mg  1 tablet at bedtime as needed on 03/18/12. Pt is aware.

## 2012-03-29 ENCOUNTER — Telehealth: Payer: Self-pay | Admitting: *Deleted

## 2012-03-29 MED ORDER — ALPRAZOLAM 1 MG PO TABS: ORAL_TABLET | ORAL | Status: DC

## 2012-03-29 NOTE — Telephone Encounter (Signed)
Received call from pt stating she requested refill of xanax from pharmacy and they were not able to fill rx? Spoke with Joe at [facility] and gave verbal auth for #60 x no refills. Last rx called in 02/25/12.

## 2012-03-30 ENCOUNTER — Telehealth: Payer: Self-pay | Admitting: *Deleted

## 2012-03-30 MED ORDER — VENLAFAXINE HCL ER 75 MG PO CP24: 150.0000 mg | ORAL_CAPSULE | Freq: Every day | ORAL | Status: DC

## 2012-03-30 NOTE — Telephone Encounter (Signed)
Increase to 150mg  once daily. Rx sent. Follow up in 1 month.  Sooner if symptoms worsen or if no improvement.

## 2012-03-30 NOTE — Telephone Encounter (Signed)
Pt states she is currently on Effexor XR 75mg  daily. "Feels like her dose needs to be increased", thinks increased dose will further help her depression. Reports that she used to be on 225mg  in the past and it seemed more effective. Pt wants to increase to 150mg .  Please advise.

## 2012-03-30 NOTE — Telephone Encounter (Signed)
Notified pt, she states she will call back tomorrow to schedule f/u.

## 2012-03-31 ENCOUNTER — Telehealth: Payer: Self-pay | Admitting: Family

## 2012-03-31 NOTE — Telephone Encounter (Signed)
Spoke with Kimberly Lin and advised him that Rx was sent in with new directions (2 capsules daily). He tried to re-send Rx and still received denial that insurance will not allow Rx to go through until 04/04/12. Notified pt, she will continue current supply until then.

## 2012-03-31 NOTE — Telephone Encounter (Signed)
Patient left message on nurse voicemail stating that since Efraim Kaufmann has upped her dosage the pharmacy will not fill Effexor because its too soon?

## 2012-04-06 ENCOUNTER — Other Ambulatory Visit: Payer: Self-pay | Admitting: Family

## 2012-04-06 DIAGNOSIS — E119 Type 2 diabetes mellitus without complications: Secondary | ICD-10-CM

## 2012-04-06 NOTE — Telephone Encounter (Signed)
I did not get consultation report back.  Kimberly Lin, are you able to see who she was set up with for opthalmology?

## 2012-04-06 NOTE — Telephone Encounter (Signed)
Received message from pt requesting contact info for ophtalmologist that we referred her to previously. Did not see name of specialist. Do you remember who you referred pt to? Also, pt requested refill of ambien. New rx was printed at 03/18/12 office visit for 10mg  1 at bedtime. Will verify with pt if she still has rx.

## 2012-04-07 ENCOUNTER — Telehealth: Payer: Self-pay | Admitting: Family

## 2012-04-07 NOTE — Telephone Encounter (Signed)
See 04/06/12 phone note.

## 2012-04-07 NOTE — Telephone Encounter (Signed)
PATEINT REQEUST REFILL ON AMBIEN.

## 2012-04-07 NOTE — Telephone Encounter (Signed)
Refill ambien 5 mg tab take 1 to two tablets by mouth at bedtime as needed for sleep qty 60 last fill 03-09-2012

## 2012-04-08 NOTE — Telephone Encounter (Signed)
OK to send refill on ambien.

## 2012-04-09 NOTE — Telephone Encounter (Signed)
Advised pt that we are not able to see name of previous ophthalmologist that we referred pt to. Pt requests referral be placed again. Rx not sent as pt was given a written Rx on 03/19/12. Verified with pt that she still has Rx and will take it to the pharmacy.

## 2012-04-12 ENCOUNTER — Ambulatory Visit: Payer: Medicare Other | Admitting: Family

## 2012-04-18 ENCOUNTER — Other Ambulatory Visit: Payer: Self-pay | Admitting: Family

## 2012-04-19 ENCOUNTER — Telehealth: Payer: Self-pay | Admitting: Family

## 2012-04-19 MED ORDER — METFORMIN HCL 500 MG PO TABS: ORAL_TABLET | ORAL | Status: DC

## 2012-04-19 NOTE — Telephone Encounter (Signed)
Refill sent, #120 x 3 refills.

## 2012-04-19 NOTE — Telephone Encounter (Signed)
Metformin 500 mg tab qty 120 take two tablets by mouth twice daily with meals

## 2012-04-21 ENCOUNTER — Telehealth: Payer: Self-pay | Admitting: *Deleted

## 2012-04-21 MED ORDER — SAXAGLIPTIN HCL 2.5 MG PO TABS: 2.5000 mg | ORAL_TABLET | Freq: Every day | ORAL | Status: DC

## 2012-04-21 MED ORDER — PITAVASTATIN CALCIUM 2 MG PO TABS: 1.0000 | ORAL_TABLET | Freq: Every day | ORAL | Status: DC

## 2012-04-21 NOTE — Telephone Encounter (Signed)
Received call from pt requesting samples of livalo and onglyza. Livalo 4mg  take 1/2 tablet daily and onglyza 5mg  take 1/2 tablet daily placed at front desk for pick up. Left detailed message on cell voicemail.

## 2012-04-26 ENCOUNTER — Other Ambulatory Visit: Payer: Self-pay | Admitting: Family

## 2012-04-26 NOTE — Telephone Encounter (Signed)
Alprazolam request [Last Rx 02.17.14 #60x0]/SLS Please advise.

## 2012-04-26 NOTE — Telephone Encounter (Signed)
OK to send #60 with zero refills. 

## 2012-04-27 NOTE — Telephone Encounter (Signed)
Rx phoned to pharmacy/SLS 

## 2012-05-03 ENCOUNTER — Other Ambulatory Visit: Payer: Self-pay | Admitting: Family

## 2012-05-04 NOTE — Telephone Encounter (Signed)
Effexor request [Last Rx 02.18.14 #60x0] Ambien request [Last Rx 02.06.14 #30x0]/SLS Please advise.

## 2012-05-19 ENCOUNTER — Telehealth: Payer: Self-pay | Admitting: *Deleted

## 2012-05-19 MED ORDER — SAXAGLIPTIN HCL 2.5 MG PO TABS: 2.5000 mg | ORAL_TABLET | Freq: Every day | ORAL | Status: DC

## 2012-05-19 MED ORDER — PITAVASTATIN CALCIUM 2 MG PO TABS: 1.0000 | ORAL_TABLET | Freq: Every day | ORAL | Status: DC

## 2012-05-19 NOTE — Telephone Encounter (Signed)
Pt left message requesting samples of onglyza and livalo. Samples placed at front desk for pick up and pt notified.

## 2012-05-25 ENCOUNTER — Ambulatory Visit (HOSPITAL_BASED_OUTPATIENT_CLINIC_OR_DEPARTMENT_OTHER)
Admission: RE | Admit: 2012-05-25 | Discharge: 2012-05-25 | Disposition: A | Discharge status: Home / Self Care | Payer: Medicare Other | Source: Ambulatory Visit | Attending: Family | Admitting: Family

## 2012-05-25 ENCOUNTER — Encounter: Payer: Self-pay | Admitting: Family

## 2012-05-25 ENCOUNTER — Ambulatory Visit (INDEPENDENT_AMBULATORY_CARE_PROVIDER_SITE_OTHER): Payer: Medicare Other | Admitting: Family

## 2012-05-25 ENCOUNTER — Ambulatory Visit (HOSPITAL_BASED_OUTPATIENT_CLINIC_OR_DEPARTMENT_OTHER): Payer: Medicare Other

## 2012-05-25 VITALS — BP 118/84 | HR 76 | Temp 97.8°F | Ht 63.5 in | Wt 178.1 lb

## 2012-05-25 DIAGNOSIS — M25511 Pain in right shoulder: Secondary | ICD-10-CM

## 2012-05-25 DIAGNOSIS — S199XXA Unspecified injury of neck, initial encounter: Secondary | ICD-10-CM | POA: Diagnosis not present

## 2012-05-25 DIAGNOSIS — I10 Essential (primary) hypertension: Secondary | ICD-10-CM | POA: Diagnosis not present

## 2012-05-25 DIAGNOSIS — S0993XA Unspecified injury of face, initial encounter: Secondary | ICD-10-CM | POA: Diagnosis not present

## 2012-05-25 DIAGNOSIS — F329 Major depressive disorder, single episode, unspecified: Secondary | ICD-10-CM | POA: Diagnosis not present

## 2012-05-25 DIAGNOSIS — E119 Type 2 diabetes mellitus without complications: Secondary | ICD-10-CM | POA: Diagnosis not present

## 2012-05-25 DIAGNOSIS — M19019 Primary osteoarthritis, unspecified shoulder: Secondary | ICD-10-CM | POA: Diagnosis not present

## 2012-05-25 DIAGNOSIS — G40909 Epilepsy, unspecified, not intractable, without status epilepticus: Secondary | ICD-10-CM | POA: Diagnosis not present

## 2012-05-25 DIAGNOSIS — S0003XA Contusion of scalp, initial encounter: Secondary | ICD-10-CM | POA: Diagnosis not present

## 2012-05-25 DIAGNOSIS — M25519 Pain in unspecified shoulder: Secondary | ICD-10-CM

## 2012-05-25 DIAGNOSIS — F3289 Other specified depressive episodes: Secondary | ICD-10-CM | POA: Insufficient documentation

## 2012-05-25 DIAGNOSIS — IMO0001 Reserved for inherently not codable concepts without codable children: Secondary | ICD-10-CM | POA: Diagnosis not present

## 2012-05-25 DIAGNOSIS — S0510XA Contusion of eyeball and orbital tissues, unspecified eye, initial encounter: Secondary | ICD-10-CM | POA: Diagnosis not present

## 2012-05-25 DIAGNOSIS — S0990XA Unspecified injury of head, initial encounter: Secondary | ICD-10-CM | POA: Diagnosis not present

## 2012-05-25 DIAGNOSIS — E785 Hyperlipidemia, unspecified: Secondary | ICD-10-CM | POA: Insufficient documentation

## 2012-05-25 DIAGNOSIS — S0083XA Contusion of other part of head, initial encounter: Secondary | ICD-10-CM | POA: Diagnosis not present

## 2012-05-25 DIAGNOSIS — S4980XA Other specified injuries of shoulder and upper arm, unspecified arm, initial encounter: Secondary | ICD-10-CM | POA: Diagnosis not present

## 2012-05-25 DIAGNOSIS — W19XXXA Unspecified fall, initial encounter: Secondary | ICD-10-CM | POA: Insufficient documentation

## 2012-05-25 DIAGNOSIS — S1093XA Contusion of unspecified part of neck, initial encounter: Secondary | ICD-10-CM | POA: Diagnosis not present

## 2012-05-25 NOTE — Progress Notes (Signed)
Subjective:    Patient ID: Kimberly Lin, female    DOB: May 13, 1945, 67 y.o.   MRN: [id number]  HPI  [name] is a 67 yr old female who presents today following a fall. She has chief complaint of right sided shoulder pain. She reports that 1 week ago she tripped and fell in her house and struck her head on the TV stand.  She developed bruising and swelling of the right orbit.  She denied LOC prior or after the injury but family thought she was briefly confused after striking her head.  She specifically comes to day to discuss right sided shoulder pain.  She reports that shoulder pain was mild after her fall, but worsened after she lifted some flower pots on Saturday. R shoulder started to hurt on Sunday.  Reports hx of right shoulder dislocation  Review of Systems See HPI  Past Medical History  Diagnosis Date  . Arthritis     osteoarthritis  . Fibromyalgia   . History of chicken pox   . Depression   . Diabetes mellitus   . GERD (gastroesophageal reflux disease)   . Hyperlipidemia   . Hypertension   . Migraine   . Personal history of colonic polyps   . History of septic shock 12/2009  . Spinal stenosis   . OSA (obstructive sleep apnea) 03/12/2011    History   Social History  . Marital Status: Married    Spouse Name: N/A    Number of Children: 2  . Years of Education: N/A   Occupational History  . Not on file.   Social History Main Topics  . Smoking status: Former Games developer  . Smokeless tobacco: Never Used     Comment: Smoked socially.  . Alcohol Use: 3.6 - 4.8 oz/week    6-8 Glasses of wine per week  . Drug Use: No  . Sexually Active: Not on file   Other Topics Concern  . Not on file   Social History Narrative   Regular exercise:  No   Caffeine use: 1 cup daily          Past Surgical History  Procedure Laterality Date  . Breast biopsy  2007, 2009    right breast  . Appendectomy  1983  . Joint replacement  2006    bilateral partial knee replacement  .  Cystectomy  2008    Pt states she had a ?cyst removed ?ovaries  . Replacement total knee      Family History  Problem Relation Age of Onset  . Cancer Mother     breast and lung  . Arthritis Mother   . Hyperlipidemia Mother   . Hypertension Mother   . Diabetes Mother   . Heart disease Mother   . Cancer Brother     lung, prostate  . Heart disease Brother   . Hypertension Brother   . Hyperlipidemia Brother   . Diabetes Brother   . Sudden death Father   . Diabetes Sister   . Hyperlipidemia Sister   . Hypertension Sister   . Diabetes Maternal Aunt   . Heart attack Maternal Aunt   . Hyperlipidemia Maternal Aunt   . Hypertension Maternal Aunt   . Hypertension Maternal Uncle   . Hyperlipidemia Maternal Uncle   . Heart attack Maternal Uncle   . Diabetes Maternal Uncle   . Diabetes Paternal Aunt   . Heart attack Paternal Aunt   . Hyperlipidemia Paternal Aunt   . Hypertension Paternal Aunt   .  Hypertension Paternal Uncle   . Hyperlipidemia Paternal Uncle   . Heart attack Paternal Uncle   . Diabetes Paternal Uncle     Allergies  Allergen Reactions  . Prednisone Other (See Comments)    "Insomnia"    Current Outpatient Prescriptions on File Prior to Visit  Medication Sig Dispense Refill  . ALPRAZolam (XANAX) 1 MG tablet TAKE ONE TABLET BY MOUTH TWICE DAILY AS NEEDED  60 tablet  0  . aspirin 81 MG tablet Take 81 mg by mouth daily.        . Calcium Carbonate-Vitamin D (CALCIUM-D) 600-400 MG-UNIT TABS Take 1 tablet by mouth daily.       . Cholecalciferol (D-3-5) 5000 UNITS capsule Take 5,000 Units by mouth daily.        Marland Kitchen co-enzyme Q-10 30 MG capsule Take 100 mg by mouth daily.        . diphenhydrAMINE (BENADRYL) 25 MG tablet Take 25 mg by mouth every 6 (six) hours as needed.      Marland Kitchen losartan (COZAAR) 25 MG tablet Take 2 tablets (50 mg total) by mouth daily.  60 tablet  0  . meloxicam (MOBIC) 7.5 MG tablet Take 1 tablet (7.5 mg total) by mouth daily.  14 tablet  0  . metFORMIN  (GLUCOPHAGE) 500 MG tablet TAKE TWO TABLETS BY MOUTH TWICE DAILY WITH  MEALS  120 tablet  3  . metoprolol (LOPRESSOR) 50 MG tablet TAKE ONE-HALF TABLET BY MOUTH TWICE DAILY  30 tablet  5  . Multiple Vitamin (MULTIVITAMIN) tablet Take 1 tablet by mouth daily.        . Omega-3 Fatty Acids (FISH OIL) 1000 MG CAPS 2 caps by mouth twice daily    0  . pantoprazole (PROTONIX) 40 MG tablet TAKE ONE TABLET BY MOUTH EVERY DAY  30 tablet  5  . Pitavastatin Calcium (LIVALO) 2 MG TABS Take 1 tablet (2 mg total) by mouth daily.  28 tablet  0  . saxagliptin HCl (ONGLYZA) 2.5 MG TABS tablet Take 1 tablet (2.5 mg total) by mouth daily.  28 tablet  0  . venlafaxine XR (EFFEXOR-XR) 75 MG 24 hr capsule TAKE TWO CAPSULES BY MOUTH ONCE DAILY  60 capsule  2  . zolpidem (AMBIEN) 10 MG tablet TAKE ONE TABLET BY MOUTH AT BEDTIME AS NEEDED FOR SLEEP  30 tablet  0   No current facility-administered medications on file prior to visit.    BP 118/84  Pulse 76  Temp(Src) 97.8 F (36.6 C) (Oral)  Ht 5' 3.5" (1.613 m)  Wt 178 lb 1.3 oz (80.777 kg)  BMI 31.05 kg/m2  SpO2 93%       Objective:   Physical Exam  Constitutional: She is oriented to person, place, and time. She appears well-developed and well-nourished. No distress.  Eyes: Pupils are equal, round, and reactive to light.  + hematoma noted overlying right brow bone.  + periorbital swelling and ecchymosis is noted  Cardiovascular: Normal rate and regular rhythm.   No murmur heard. Pulmonary/Chest: Effort normal and breath sounds normal. No respiratory distress. She has no wheezes. She has no rales. She exhibits no tenderness.  Neurological: She is alert and oriented to person, place, and time.  Skin: Skin is warm and dry.  Psychiatric: She has a normal mood and affect. Her behavior is normal. Judgment and thought content normal.          Assessment & Plan:

## 2012-05-25 NOTE — Assessment & Plan Note (Signed)
R shoulder x ray is negative for fracture.  Monitor for now.

## 2012-05-25 NOTE — Patient Instructions (Addendum)
Please complete xrays and CT on the first floor.   We will contact you with your results.

## 2012-05-25 NOTE — Assessment & Plan Note (Addendum)
No orbital fracture noted on x ray. CT head negative for bleed.  Need to exclude SDH. Suspect she suffered a concussion based on history.

## 2012-05-26 ENCOUNTER — Other Ambulatory Visit: Payer: Self-pay | Admitting: Family

## 2012-05-26 NOTE — Telephone Encounter (Signed)
Alprazolam request [Last Rx 03.17.14 #60x0]/SLS Zolpidem request [Last Rx 03.24.14 #30x0]/SLS Please advise.

## 2012-05-30 NOTE — Telephone Encounter (Signed)
RX's have been called in.

## 2012-05-31 ENCOUNTER — Telehealth: Payer: Self-pay | Admitting: Family

## 2012-05-31 DIAGNOSIS — E119 Type 2 diabetes mellitus without complications: Secondary | ICD-10-CM | POA: Diagnosis not present

## 2012-05-31 NOTE — Telephone Encounter (Signed)
Xanax 1 mg tab take 1 tablet by mouth twice daily as needed qty 60  Zolpidem 10 mg tablet take 1 tablet by mouth at bedtime as needed  Qty 30

## 2012-06-01 NOTE — Telephone Encounter (Signed)
Both Rxs previously phone in on 05/26/12. Verified with pharmacist that rxs were picked up by pt on 05/30/12 and to disregard requests.

## 2012-06-15 ENCOUNTER — Encounter: Payer: Self-pay | Admitting: Family

## 2012-06-15 ENCOUNTER — Ambulatory Visit (INDEPENDENT_AMBULATORY_CARE_PROVIDER_SITE_OTHER): Payer: Medicare Other | Admitting: Family

## 2012-06-15 ENCOUNTER — Telehealth: Payer: Self-pay | Admitting: Family

## 2012-06-15 ENCOUNTER — Other Ambulatory Visit (HOSPITAL_COMMUNITY)
Admission: RE | Admit: 2012-06-15 | Discharge: 2012-06-15 | Disposition: A | Discharge status: Home / Self Care | Payer: Medicare Other | Source: Ambulatory Visit | Attending: Family | Admitting: Family

## 2012-06-15 VITALS — BP 128/88 | HR 90 | Temp 97.8°F | Resp 16 | Ht 63.5 in | Wt 178.0 lb

## 2012-06-15 DIAGNOSIS — F32A Depression, unspecified: Secondary | ICD-10-CM

## 2012-06-15 DIAGNOSIS — I10 Essential (primary) hypertension: Secondary | ICD-10-CM

## 2012-06-15 DIAGNOSIS — Z23 Encounter for immunization: Secondary | ICD-10-CM

## 2012-06-15 DIAGNOSIS — IMO0001 Reserved for inherently not codable concepts without codable children: Secondary | ICD-10-CM | POA: Diagnosis not present

## 2012-06-15 DIAGNOSIS — E785 Hyperlipidemia, unspecified: Secondary | ICD-10-CM

## 2012-06-15 DIAGNOSIS — F329 Major depressive disorder, single episode, unspecified: Secondary | ICD-10-CM

## 2012-06-15 DIAGNOSIS — Z124 Encounter for screening for malignant neoplasm of cervix: Secondary | ICD-10-CM | POA: Diagnosis not present

## 2012-06-15 DIAGNOSIS — K219 Gastro-esophageal reflux disease without esophagitis: Secondary | ICD-10-CM | POA: Diagnosis not present

## 2012-06-15 DIAGNOSIS — Z01419 Encounter for gynecological examination (general) (routine) without abnormal findings: Secondary | ICD-10-CM | POA: Diagnosis not present

## 2012-06-15 DIAGNOSIS — Z0000 Encounter for general adult medical examination without abnormal findings: Secondary | ICD-10-CM

## 2012-06-15 DIAGNOSIS — G4733 Obstructive sleep apnea (adult) (pediatric): Secondary | ICD-10-CM

## 2012-06-15 DIAGNOSIS — E119 Type 2 diabetes mellitus without complications: Secondary | ICD-10-CM

## 2012-06-15 LAB — HEPATIC FUNCTION PANEL
ALT: 13 U/L (ref 0–35)
AST: 19 U/L (ref 0–37)
Albumin: 4.3 g/dL (ref 3.5–5.2)
Alkaline Phosphatase: 53 U/L (ref 39–117)
Total Protein: 7 g/dL (ref 6.0–8.3)

## 2012-06-15 LAB — BASIC METABOLIC PANEL WITH GFR
BUN: 15 mg/dL (ref 6–23)
Chloride: 95 mEq/L — ABNORMAL LOW (ref 96–112)
Glucose, Bld: 110 mg/dL — ABNORMAL HIGH (ref 70–99)
Potassium: 4.3 mEq/L (ref 3.5–5.3)
Sodium: 133 mEq/L — ABNORMAL LOW (ref 135–145)

## 2012-06-15 LAB — MICROALBUMIN / CREATININE URINE RATIO
Creatinine, Urine: 164.6 mg/dL
Microalb Creat Ratio: 57.9 mg/g — ABNORMAL HIGH (ref 0.0–30.0)

## 2012-06-15 LAB — HEMOGLOBIN A1C: Hgb A1c MFr Bld: 5.2 % (ref <5.7)

## 2012-06-15 LAB — LIPID PANEL: Cholesterol: 193 mg/dL (ref 0–200)

## 2012-06-15 NOTE — Patient Instructions (Addendum)
Please complete your lab work prior to leaving. Please schedule a follow up appointment in 3 months.  

## 2012-06-15 NOTE — Telephone Encounter (Signed)
Diabetes control looks great.  She can cut metformin from 2 tabs to 1 tablet twice daily. Continue onglyza.  Liver tests look great also.  Continue diet, exercise.  Trigs up a bit.  Continue fish oil 2000mg  bid, along with avoiding white fluffy carbs and concentrated sweets.

## 2012-06-15 NOTE — Progress Notes (Signed)
Subjective:    Patient ID: Kimberly Lin, female    DOB: 04-20-1945, 67 y.o.   MRN: [id number]  HPI  Subjective:   Patient here for Medicare annual wellness visit and management of other chronic and acute problems.  DM2- Reports that she has lost 12 pounds.  She is walking.  Eating 1200 caloris a day- not losing any more.  HTN- Tolerating BP meds.    Hyperlipidemia- on Livalo- denies unusual myalgia.  GERD- report that this is well controlled on protonix.   OSA- Declined cpap  Depression- Reports that her mood is good.  She continues effexor.  Reports that she sleeps ok as long as she takes [place].    Preventative Care-  Immunizations: due for tetanus Diet:good, working on weight loss Exercise: some Colonoscopy: up to date Dexa: will be due 11/14 Pap Smear:due Mammogram: due in December   Risk factors: At risk for CAD due to overweight, HTN, Hyperlipidemia  Roster of Physicians Providing Medical Care to Patient   Activities of Daily Living  In your present state of health, do you have any difficulty performing the following activities? Preparing food and eating?: No  Bathing yourself: No  Getting dressed: No  Using the toilet:No  Moving around from place to place: No  In the past year have you fallen or had a near fall?: yes, tripped Home Safety: Has smoke detector and wears seat belts. No firearms. No excess sun exposure.  Diet and Exercise  Current exercise habits: yes Dietary issues discussed: healthy diet   Depression Screen  (Note: if answer to either of the following is "Yes", then a more complete depression screening is indicated)  Q1: Over the past two weeks, have you felt down, depressed or hopeless?no  Q2: Over the past two weeks, have you felt little interest or pleasure in doing things? no   The following portions of the patient's history were reviewed and updated as appropriate: allergies, current medications, past family history, past medical  history, past social history, past surgical history and problem list.   Past Medical History  Diagnosis Date  . Arthritis     osteoarthritis  . Fibromyalgia   . History of chicken pox   . Depression   . Diabetes mellitus   . GERD (gastroesophageal reflux disease)   . Hyperlipidemia   . Hypertension   . Migraine   . Personal history of colonic polyps   . History of septic shock 12/2009  . Spinal stenosis   . OSA (obstructive sleep apnea) 03/12/2011    History   Social History  . Marital Status: Married    Spouse Name: N/A    Number of Children: 2  . Years of Education: N/A   Occupational History  . Not on file.   Social History Main Topics  . Smoking status: Former Games developer  . Smokeless tobacco: Never Used     Comment: Smoked socially.  . Alcohol Use: 3.6 - 4.8 oz/week    6-8 Glasses of wine per week  . Drug Use: No  . Sexually Active: Not on file   Other Topics Concern  . Not on file   Social History Narrative   Regular exercise:  No   Caffeine use: 1 cup daily          Past Surgical History  Procedure Laterality Date  . Breast biopsy  2007, 2009    right breast  . Appendectomy  1983  . Joint replacement  2006    bilateral  partial knee replacement  . Cystectomy  2008    Pt states she had a ?cyst removed ?ovaries  . Replacement total knee      Family History  Problem Relation Age of Onset  . Cancer Mother     breast and lung  . Arthritis Mother   . Hyperlipidemia Mother   . Hypertension Mother   . Diabetes Mother   . Heart disease Mother   . Cancer Brother     lung, prostate  . Heart disease Brother   . Hypertension Brother   . Hyperlipidemia Brother   . Diabetes Brother   . Sudden death Father   . Diabetes Sister   . Hyperlipidemia Sister   . Hypertension Sister   . Diabetes Maternal Aunt   . Heart attack Maternal Aunt   . Hyperlipidemia Maternal Aunt   . Hypertension Maternal Aunt   . Hypertension Maternal Uncle   . Hyperlipidemia  Maternal Uncle   . Heart attack Maternal Uncle   . Diabetes Maternal Uncle   . Diabetes Paternal Aunt   . Heart attack Paternal Aunt   . Hyperlipidemia Paternal Aunt   . Hypertension Paternal Aunt   . Hypertension Paternal Uncle   . Hyperlipidemia Paternal Uncle   . Heart attack Paternal Uncle   . Diabetes Paternal Uncle     Allergies  Allergen Reactions  . Prednisone Other (See Comments)    "Insomnia"    Current Outpatient Prescriptions on File Prior to Visit  Medication Sig Dispense Refill  . ALPRAZolam (XANAX) 1 MG tablet TAKE ONE TABLET BY MOUTH TWICE DAILY AS NEEDED  60 tablet  0  . aspirin 81 MG tablet Take 81 mg by mouth daily.        . Calcium Carbonate-Vitamin D (CALCIUM-D) 600-400 MG-UNIT TABS Take 1 tablet by mouth daily.       . Cholecalciferol (D-3-5) 5000 UNITS capsule Take 5,000 Units by mouth daily.        Marland Kitchen co-enzyme Q-10 30 MG capsule Take 100 mg by mouth daily.        . diphenhydrAMINE (BENADRYL) 25 MG tablet Take 25 mg by mouth every 6 (six) hours as needed.      Marland Kitchen losartan (COZAAR) 25 MG tablet Take 2 tablets (50 mg total) by mouth daily.  60 tablet  0  . meloxicam (MOBIC) 7.5 MG tablet Take 1 tablet (7.5 mg total) by mouth daily.  14 tablet  0  . metFORMIN (GLUCOPHAGE) 500 MG tablet TAKE TWO TABLETS BY MOUTH TWICE DAILY WITH  MEALS  120 tablet  3  . metoprolol (LOPRESSOR) 50 MG tablet TAKE ONE-HALF TABLET BY MOUTH TWICE DAILY  30 tablet  5  . Multiple Vitamin (MULTIVITAMIN) tablet Take 1 tablet by mouth daily.        . Omega-3 Fatty Acids (FISH OIL) 1000 MG CAPS 2 caps by mouth twice daily    0  . pantoprazole (PROTONIX) 40 MG tablet TAKE ONE TABLET BY MOUTH EVERY DAY  30 tablet  5  . Pitavastatin Calcium (LIVALO) 2 MG TABS Take 1 tablet (2 mg total) by mouth daily.  28 tablet  0  . saxagliptin HCl (ONGLYZA) 2.5 MG TABS tablet Take 1 tablet (2.5 mg total) by mouth daily.  28 tablet  0  . venlafaxine XR (EFFEXOR-XR) 75 MG 24 hr capsule TAKE TWO CAPSULES BY  MOUTH ONCE DAILY  60 capsule  2  . zolpidem (AMBIEN) 10 MG tablet TAKE ONE TABLET BY MOUTH AT  BEDTIME AS NEEDED  30 tablet  0   No current facility-administered medications on file prior to visit.    BP 128/88  Pulse 90  Temp(Src) 97.8 F (36.6 C) (Oral)  Resp 16  Ht 5' 3.5" (1.613 m)  Wt 178 lb 0.6 oz (80.758 kg)  BMI 31.04 kg/m2  SpO2 98%    Objective:   Vision:declined Hearing: able to hear forced whisper at 6 ft Body mass index:see vitals Cognitive Impairment Assessment: cognition, memory and judgment appear normal.   Assessment:   Medicare wellness utd on preventive parameters   During the course of the visit the patient was educated and counseled about appropriate screening and preventive services including:       Fall prevention  Vaccines / LABS Patient Instructions (the written plan) was given to the patient.       Review of Systems  Constitutional: Negative for unexpected weight change.  HENT: Negative for hearing loss and congestion.   Eyes: Negative for visual disturbance.  Respiratory: Negative for cough.   Cardiovascular: Negative for chest pain.  Gastrointestinal: Negative for vomiting, diarrhea and constipation.  Genitourinary: Negative for dysuria and frequency.  Musculoskeletal: Positive for myalgias.  Skin: Negative for rash.  Neurological: Negative for headaches.  Hematological: Negative for adenopathy.  Psychiatric/Behavioral:       Denies anxiety   Past Medical History  Diagnosis Date  . Arthritis     osteoarthritis  . Fibromyalgia   . History of chicken pox   . Depression   . Diabetes mellitus   . GERD (gastroesophageal reflux disease)   . Hyperlipidemia   . Hypertension   . Migraine   . Personal history of colonic polyps   . History of septic shock 12/2009  . Spinal stenosis   . OSA (obstructive sleep apnea) 03/12/2011    History   Social History  . Marital Status: Married    Spouse Name: N/A    Number of Children: 2  .  Years of Education: N/A   Occupational History  . Not on file.   Social History Main Topics  . Smoking status: Former Games developer  . Smokeless tobacco: Never Used     Comment: Smoked socially.  . Alcohol Use: 3.6 - 4.8 oz/week    6-8 Glasses of wine per week  . Drug Use: No  . Sexually Active: Not on file   Other Topics Concern  . Not on file   Social History Narrative   Regular exercise:  No   Caffeine use: 1 cup daily          Past Surgical History  Procedure Laterality Date  . Breast biopsy  2007, 2009    right breast  . Appendectomy  1983  . Joint replacement  2006    bilateral partial knee replacement  . Cystectomy  2008    Pt states she had a ?cyst removed ?ovaries  . Replacement total knee      Family History  Problem Relation Age of Onset  . Cancer Mother     breast and lung  . Arthritis Mother   . Hyperlipidemia Mother   . Hypertension Mother   . Diabetes Mother   . Heart disease Mother   . Cancer Brother     lung, prostate  . Heart disease Brother   . Hypertension Brother   . Hyperlipidemia Brother   . Diabetes Brother   . Sudden death Father   . Diabetes Sister   . Hyperlipidemia Sister   .  Hypertension Sister   . Diabetes Maternal Aunt   . Heart attack Maternal Aunt   . Hyperlipidemia Maternal Aunt   . Hypertension Maternal Aunt   . Hypertension Maternal Uncle   . Hyperlipidemia Maternal Uncle   . Heart attack Maternal Uncle   . Diabetes Maternal Uncle   . Diabetes Paternal Aunt   . Heart attack Paternal Aunt   . Hyperlipidemia Paternal Aunt   . Hypertension Paternal Aunt   . Hypertension Paternal Uncle   . Hyperlipidemia Paternal Uncle   . Heart attack Paternal Uncle   . Diabetes Paternal Uncle     Allergies  Allergen Reactions  . Prednisone Other (See Comments)    "Insomnia"    Current Outpatient Prescriptions on File Prior to Visit  Medication Sig Dispense Refill  . ALPRAZolam (XANAX) 1 MG tablet TAKE ONE TABLET BY MOUTH TWICE  DAILY AS NEEDED  60 tablet  0  . aspirin 81 MG tablet Take 81 mg by mouth daily.        . Calcium Carbonate-Vitamin D (CALCIUM-D) 600-400 MG-UNIT TABS Take 1 tablet by mouth daily.       . Cholecalciferol (D-3-5) 5000 UNITS capsule Take 5,000 Units by mouth daily.        Marland Kitchen co-enzyme Q-10 30 MG capsule Take 100 mg by mouth daily.        . diphenhydrAMINE (BENADRYL) 25 MG tablet Take 25 mg by mouth every 6 (six) hours as needed.      Marland Kitchen losartan (COZAAR) 25 MG tablet Take 2 tablets (50 mg total) by mouth daily.  60 tablet  0  . meloxicam (MOBIC) 7.5 MG tablet Take 1 tablet (7.5 mg total) by mouth daily.  14 tablet  0  . metoprolol (LOPRESSOR) 50 MG tablet TAKE ONE-HALF TABLET BY MOUTH TWICE DAILY  30 tablet  5  . Multiple Vitamin (MULTIVITAMIN) tablet Take 1 tablet by mouth daily.        . Omega-3 Fatty Acids (FISH OIL) 1000 MG CAPS 2 caps by mouth twice daily    0  . pantoprazole (PROTONIX) 40 MG tablet TAKE ONE TABLET BY MOUTH EVERY DAY  30 tablet  5  . Pitavastatin Calcium (LIVALO) 2 MG TABS Take 1 tablet (2 mg total) by mouth daily.  28 tablet  0  . saxagliptin HCl (ONGLYZA) 2.5 MG TABS tablet Take 1 tablet (2.5 mg total) by mouth daily.  28 tablet  0  . venlafaxine XR (EFFEXOR-XR) 75 MG 24 hr capsule TAKE TWO CAPSULES BY MOUTH ONCE DAILY  60 capsule  2  . zolpidem (AMBIEN) 10 MG tablet TAKE ONE TABLET BY MOUTH AT BEDTIME AS NEEDED  30 tablet  0   No current facility-administered medications on file prior to visit.    BP 128/88  Pulse 90  Temp(Src) 97.8 F (36.6 C) (Oral)  Resp 16  Ht 5' 3.5" (1.613 m)  Wt 178 lb 0.6 oz (80.758 kg)  BMI 31.04 kg/m2  SpO2 98%       Objective:   Physical Exam  Physical Exam  Constitutional: She is oriented to person, place, and time. She appears well-developed and well-nourished. No distress.  HENT:  Head: Normocephalic and atraumatic.  Right Ear: Tympanic membrane and ear canal normal.  Left Ear: Tympanic membrane and ear canal normal.   Mouth/Throat: Oropharynx is clear and moist.  Eyes: Pupils are equal, round, and reactive to light. No scleral icterus.  Neck: Normal range of motion. No thyromegaly present.  Cardiovascular:  Normal rate and regular rhythm.   No murmur heard. Pulmonary/Chest: Effort normal and breath sounds normal. No respiratory distress. He has no wheezes. She has no rales. She exhibits no tenderness.  Abdominal: Soft. Bowel sounds are normal. He exhibits no distension and no mass. There is no tenderness. There is no rebound and no guarding.  Musculoskeletal: She exhibits no edema.  Lymphadenopathy:    She has no cervical adenopathy.  Neurological: She is alert and oriented to person, place, and time.  She exhibits normal muscle tone. Coordination normal.  Skin: Skin is warm and dry.  Psychiatric: She has a normal mood and affect. Her behavior is normal. Judgment and thought content normal.  Breasts: Examined lying Right: Without masses, retractions, discharge or axillary adenopathy.  Left: Without masses, retractions, discharge or axillary adenopathy.  Inguinal/mons: Normal without inguinal adenopathy  External genitalia: Normal  BUS/Urethra/Skene's glands: Normal  Bladder: Normal  Vagina: Atrophic Cervix: Normal  Uterus: normal in size, shape and contour. Midline and mobile  Adnexa/parametria:  Rt: Without masses or tenderness.  Lt: Without masses or tenderness.  Anus and perineum: Normal           Assessment & Plan:         Assessment & Plan:

## 2012-06-16 NOTE — Telephone Encounter (Signed)
Left message on cell# to return my call. 

## 2012-06-16 NOTE — Telephone Encounter (Signed)
Patient returned phone call. °

## 2012-06-16 NOTE — Telephone Encounter (Signed)
Notified pt and she voices understanding. 

## 2012-06-17 ENCOUNTER — Encounter: Payer: Self-pay | Admitting: Family

## 2012-06-17 NOTE — Assessment & Plan Note (Signed)
Clinically stable.  Obtain A1C.  

## 2012-06-17 NOTE — Assessment & Plan Note (Signed)
BP Readings from Last 3 Encounters:  06/15/12 128/88  05/25/12 118/84  03/18/12 120/84   BP is stable.  Monitor.

## 2012-06-17 NOTE — Assessment & Plan Note (Signed)
Declined CPAP-focusing on weight loss.

## 2012-06-17 NOTE — Assessment & Plan Note (Signed)
Stable on effexor, continue same. 

## 2012-06-17 NOTE — Assessment & Plan Note (Signed)
Stable on protonix, continue same.  

## 2012-06-19 ENCOUNTER — Encounter: Payer: Self-pay | Admitting: Family

## 2012-06-21 ENCOUNTER — Telehealth: Payer: Self-pay | Admitting: *Deleted

## 2012-06-21 ENCOUNTER — Other Ambulatory Visit: Payer: Self-pay | Admitting: Family

## 2012-06-21 MED ORDER — SAXAGLIPTIN HCL 2.5 MG PO TABS: 2.5000 mg | ORAL_TABLET | Freq: Every day | ORAL | Status: DC

## 2012-06-21 MED ORDER — PITAVASTATIN CALCIUM 2 MG PO TABS: 1.0000 | ORAL_TABLET | Freq: Every day | ORAL | Status: DC

## 2012-06-21 NOTE — Telephone Encounter (Signed)
Pt left message requesting samples of onglyza and livalo. Samples placed at front desk for pick up. Detailed message left on cell # and to call if any questions.

## 2012-06-22 NOTE — Telephone Encounter (Signed)
Refill request for Xanax Last filled by MD on -05/30/12 #60 x0 Last seen-06/15/12 F/U-09/14/12  Refill request for Ambien Last filled by MD on -05/30/12 #30 x0 Last seen-06/15/12 F/U-09/14/12  Please advise refills?

## 2012-06-22 NOTE — Telephone Encounter (Signed)
Can refill both on 5/18.  Too soon right now.

## 2012-06-23 NOTE — Telephone Encounter (Signed)
Notified pt. She voices understanding. Pt states she received letter from [facility] that they are only going to cover #90 tablets per year. Pt was previously told that zolpidem 10mg  was covered under her plan. Advised pt to contact [facility] for further explanation.

## 2012-06-30 ENCOUNTER — Telehealth: Payer: Self-pay | Admitting: *Deleted

## 2012-06-30 NOTE — Telephone Encounter (Signed)
Received call from pt stating she needs [place] to request a quantity limit override with [employer], [phone]. [employer] ID [id number]. Requested form be faxed.

## 2012-07-01 NOTE — Telephone Encounter (Signed)
Form received and forwarded to Provider for completion/signature.

## 2012-07-02 NOTE — Telephone Encounter (Signed)
Form faxed to [facility] at [phone] at 8:36 this am. Awaiting approval/ denial status.

## 2012-07-02 NOTE — Telephone Encounter (Signed)
Signed.

## 2012-07-07 DIAGNOSIS — L723 Sebaceous cyst: Secondary | ICD-10-CM | POA: Diagnosis not present

## 2012-07-07 NOTE — Telephone Encounter (Signed)
Received authorization from [facility] approviing zolpidem 10mg   30/30 through 07/03/13.  Left detailed message on pharmacy and pt's cell# re: approval and to call if any questions.

## 2012-07-16 ENCOUNTER — Other Ambulatory Visit: Payer: Self-pay | Admitting: Family

## 2012-07-16 NOTE — Telephone Encounter (Signed)
Patient is requesting a 90 day supply of all medications to be sent to [facility] pharmacy

## 2012-07-19 ENCOUNTER — Encounter: Payer: Self-pay | Admitting: Family

## 2012-07-19 ENCOUNTER — Ambulatory Visit (INDEPENDENT_AMBULATORY_CARE_PROVIDER_SITE_OTHER): Payer: Medicare Other | Admitting: Family

## 2012-07-19 VITALS — BP 120/72 | HR 67 | Temp 98.0°F | Resp 16 | Wt 181.1 lb

## 2012-07-19 DIAGNOSIS — E669 Obesity, unspecified: Secondary | ICD-10-CM

## 2012-07-19 DIAGNOSIS — F32A Depression, unspecified: Secondary | ICD-10-CM

## 2012-07-19 DIAGNOSIS — F329 Major depressive disorder, single episode, unspecified: Secondary | ICD-10-CM | POA: Diagnosis not present

## 2012-07-19 DIAGNOSIS — H8110 Benign paroxysmal vertigo, unspecified ear: Secondary | ICD-10-CM | POA: Insufficient documentation

## 2012-07-19 MED ORDER — METOPROLOL TARTRATE 50 MG PO TABS: ORAL_TABLET | ORAL | Status: DC

## 2012-07-19 MED ORDER — LOSARTAN POTASSIUM 25 MG PO TABS: 50.0000 mg | ORAL_TABLET | Freq: Every day | ORAL | Status: DC

## 2012-07-19 MED ORDER — PANTOPRAZOLE SODIUM 40 MG PO TBEC: DELAYED_RELEASE_TABLET | ORAL | Status: DC

## 2012-07-19 MED ORDER — SAXAGLIPTIN HCL 2.5 MG PO TABS: 2.5000 mg | ORAL_TABLET | Freq: Every day | ORAL | Status: DC

## 2012-07-19 MED ORDER — PITAVASTATIN CALCIUM 2 MG PO TABS: 1.0000 | ORAL_TABLET | Freq: Every day | ORAL | Status: DC

## 2012-07-19 MED ORDER — METFORMIN HCL 500 MG PO TABS: ORAL_TABLET | ORAL | Status: DC

## 2012-07-19 NOTE — Progress Notes (Signed)
Subjective:    Patient ID: Kimberly Lin, female    DOB: May 25, 1945, 67 y.o.   MRN: [id number]  HPI  Kimberly Lin is a 67 yr old female who presents today for follow up. Reports that she has chronic aching/burning and hurting.  Attributes this to FM or OA.  Continues to have dizziness.    Depression- Mood is good.  Feels well on effexor.    DM2- reports sugars stable at home. Denies symptomatic hypoglycemia.    She does elliptical/treadmill.  Tries to get 30 minutes 4 times a week.   Keeps calories to 1200 a day and doesn't think she could go lower.  Review of Systems See HPI  Past Medical History  Diagnosis Date  . Arthritis     osteoarthritis  . Fibromyalgia   . History of chicken pox   . Depression   . Diabetes mellitus   . GERD (gastroesophageal reflux disease)   . Hyperlipidemia   . Hypertension   . Migraine   . Personal history of colonic polyps   . History of septic shock 12/2009  . Spinal stenosis   . OSA (obstructive sleep apnea) 03/12/2011    History   Social History  . Marital Status: Married    Spouse Name: N/A    Number of Children: 2  . Years of Education: N/A   Occupational History  . Not on file.   Social History Main Topics  . Smoking status: Former Games developer  . Smokeless tobacco: Never Used     Comment: Smoked socially.  . Alcohol Use: 3.6 - 4.8 oz/week    6-8 Glasses of wine per week  . Drug Use: No  . Sexually Active: Not on file   Other Topics Concern  . Not on file   Social History Narrative   Regular exercise:  No   Caffeine use: 1 cup daily          Past Surgical History  Procedure Laterality Date  . Breast biopsy  2007, 2009    right breast  . Appendectomy  1983  . Joint replacement  2006    bilateral partial knee replacement  . Cystectomy  2008    Pt states she had a ?cyst removed ?ovaries  . Replacement total knee      Family History  Problem Relation Age of Onset  . Cancer Mother     breast and lung  .  Arthritis Mother   . Hyperlipidemia Mother   . Hypertension Mother   . Diabetes Mother   . Heart disease Mother   . Cancer Brother     lung, prostate  . Heart disease Brother   . Hypertension Brother   . Hyperlipidemia Brother   . Diabetes Brother   . Sudden death Father   . Diabetes Sister   . Hyperlipidemia Sister   . Hypertension Sister   . Diabetes Maternal Aunt   . Heart attack Maternal Aunt   . Hyperlipidemia Maternal Aunt   . Hypertension Maternal Aunt   . Hypertension Maternal Uncle   . Hyperlipidemia Maternal Uncle   . Heart attack Maternal Uncle   . Diabetes Maternal Uncle   . Diabetes Paternal Aunt   . Heart attack Paternal Aunt   . Hyperlipidemia Paternal Aunt   . Hypertension Paternal Aunt   . Hypertension Paternal Uncle   . Hyperlipidemia Paternal Uncle   . Heart attack Paternal Uncle   . Diabetes Paternal Uncle     Allergies  Allergen  Reactions  . Prednisone Other (See Comments)    "Insomnia"    Current Outpatient Prescriptions on File Prior to Visit  Medication Sig Dispense Refill  . ALPRAZolam (XANAX) 1 MG tablet TAKE ONE TABLET BY MOUTH TWICE DAILY AS NEEDED  60 tablet  0  . aspirin 81 MG tablet Take 81 mg by mouth daily.        . Calcium Carbonate-Vitamin D (CALCIUM-D) 600-400 MG-UNIT TABS Take 1 tablet by mouth daily.       . Cholecalciferol (D-3-5) 5000 UNITS capsule Take 5,000 Units by mouth daily.        Marland Kitchen co-enzyme Q-10 30 MG capsule Take 100 mg by mouth daily.        . diphenhydrAMINE (BENADRYL) 25 MG tablet Take 25 mg by mouth every 6 (six) hours as needed.      Marland Kitchen losartan (COZAAR) 25 MG tablet Take 2 tablets (50 mg total) by mouth daily.  180 tablet  0  . meloxicam (MOBIC) 7.5 MG tablet Take 1 tablet (7.5 mg total) by mouth daily.  14 tablet  0  . metFORMIN (GLUCOPHAGE) 500 MG tablet TAKE TWO TABLETS BY MOUTH TWICE DAILY WITH  MEALS  360 tablet  0  . metoprolol (LOPRESSOR) 50 MG tablet TAKE ONE-HALF TABLET BY MOUTH TWICE DAILY  90 tablet  0   . Multiple Vitamin (MULTIVITAMIN) tablet Take 1 tablet by mouth daily.        . Omega-3 Fatty Acids (FISH OIL) 1000 MG CAPS 2 caps by mouth twice daily    0  . pantoprazole (PROTONIX) 40 MG tablet TAKE ONE TABLET BY MOUTH EVERY DAY  90 tablet  0  . Pitavastatin Calcium (LIVALO) 2 MG TABS Take 1 tablet (2 mg total) by mouth daily.  90 tablet  0  . saxagliptin HCl (ONGLYZA) 2.5 MG TABS tablet Take 1 tablet (2.5 mg total) by mouth daily.  90 tablet  0  . venlafaxine XR (EFFEXOR-XR) 75 MG 24 hr capsule TAKE TWO CAPSULES BY MOUTH ONCE DAILY  60 capsule  2  . zolpidem (AMBIEN) 10 MG tablet Take 1 tablet (10 mg total) by mouth at bedtime as needed for sleep.  30 tablet  0   No current facility-administered medications on file prior to visit.    BP 120/72  Pulse 67  Temp(Src) 98 F (36.7 C) (Oral)  Resp 16  Wt 181 lb 1.3 oz (82.137 kg)  BMI 31.57 kg/m2  SpO2 97%       Objective:   Physical Exam  Constitutional: She is oriented to person, place, and time. She appears well-developed and well-nourished.  HENT:  Right Ear: Tympanic membrane and ear canal normal.  Left Ear: Tympanic membrane and ear canal normal.  Cardiovascular: Normal rate and regular rhythm.   No murmur heard. Pulmonary/Chest: Effort normal and breath sounds normal. No respiratory distress. She has no wheezes. She has no rales. She exhibits no tenderness.  Neurological: She is alert and oriented to person, place, and time.  Psychiatric: She has a normal mood and affect. Her behavior is normal. Judgment and thought content normal.          Assessment & Plan:

## 2012-07-19 NOTE — Assessment & Plan Note (Signed)
Improved on effexor. Continue same. 

## 2012-07-19 NOTE — Assessment & Plan Note (Signed)
Discussed increasing her cardio exercise to help burn more calories and facilitate weight loss.

## 2012-07-19 NOTE — Assessment & Plan Note (Signed)
Unchanged. Refer for vestibular PT

## 2012-07-19 NOTE — Telephone Encounter (Signed)
Please advise refills? Ambien, Effexor, Xanax Patient last seen on 06/15/12 Has F/U on 07/19/12

## 2012-07-19 NOTE — Patient Instructions (Addendum)
You will be contacted about your referral to physical therapy. Please schedule a follow up appointment in 3 months.

## 2012-07-19 NOTE — Telephone Encounter (Signed)
OK to send 30 day of xanax and ambien, 30 day 2 refills of effexor.

## 2012-07-20 NOTE — Telephone Encounter (Signed)
OK to give 90 day supply of Alprazolam and Ambien, fax copy signed.

## 2012-07-20 NOTE — Telephone Encounter (Signed)
I cannot give 90 day supply of controlled substances.  I will forward to Dr. Abner Greenspan to see if she is comfortable authorizing.

## 2012-07-20 NOTE — Telephone Encounter (Signed)
Please advise It ok to send 90 day supply instead of 30 day. Request is from mail order

## 2012-07-21 MED ORDER — ALPRAZOLAM 1 MG PO TABS: ORAL_TABLET | ORAL | Status: DC

## 2012-07-21 MED ORDER — VENLAFAXINE HCL ER 75 MG PO CP24: ORAL_CAPSULE | ORAL | Status: DC

## 2012-07-21 MED ORDER — ZOLPIDEM TARTRATE 10 MG PO TABS: 10.0000 mg | ORAL_TABLET | Freq: Every evening | ORAL | Status: DC | PRN

## 2012-07-21 NOTE — Telephone Encounter (Signed)
Rxs faxed to [facility] at [phone] for alprazolam and zolpidem. Effexor refill sent via e-Rx.

## 2012-07-24 ENCOUNTER — Other Ambulatory Visit: Payer: Self-pay | Admitting: Family

## 2012-07-26 MED ORDER — LOSARTAN POTASSIUM 25 MG PO TABS: 25.0000 mg | ORAL_TABLET | Freq: Every day | ORAL | Status: DC

## 2012-07-26 NOTE — Addendum Note (Signed)
Addended by: Sueanne Margarita on: 07/26/2012 01:06 PM   Modules accepted: Orders, Medications

## 2012-07-26 NOTE — Addendum Note (Signed)
Addended by: Sueanne Margarita on: 07/26/2012 01:46 PM   Modules accepted: Orders

## 2012-07-29 ENCOUNTER — Telehealth: Payer: Self-pay | Admitting: *Deleted

## 2012-07-29 NOTE — Telephone Encounter (Signed)
Called pt no answer LMOM will leave sample for pick up. Only have ongkyza 5 mg can take 1/2 tab to equal 2.5 mg.../lmb

## 2012-07-29 NOTE — Telephone Encounter (Signed)
Left msg on vm requesting samples of onglyza...lmb

## 2012-08-05 ENCOUNTER — Other Ambulatory Visit: Payer: Self-pay | Admitting: Family

## 2012-08-05 MED ORDER — PITAVASTATIN CALCIUM 2 MG PO TABS: 1.0000 | ORAL_TABLET | Freq: Every day | ORAL | Status: DC

## 2012-08-05 NOTE — Telephone Encounter (Signed)
Spoke with pt re: effexor rx. She thinks she has about 5 days left and has not received mail order supply yet. She will check with [facility] on status of rx and call us if short term supply will be needed at local pharmacy. 3 boxes of livalo (21tabs) placed at front desk for pick up.

## 2012-08-06 DIAGNOSIS — M47817 Spondylosis without myelopathy or radiculopathy, lumbosacral region: Secondary | ICD-10-CM | POA: Diagnosis not present

## 2012-08-06 DIAGNOSIS — IMO0002 Reserved for concepts with insufficient information to code with codable children: Secondary | ICD-10-CM | POA: Diagnosis not present

## 2012-08-11 ENCOUNTER — Telehealth: Payer: Self-pay | Admitting: *Deleted

## 2012-08-11 MED ORDER — VENLAFAXINE HCL ER 75 MG PO CP24: ORAL_CAPSULE | ORAL | Status: DC

## 2012-08-11 MED ORDER — SAXAGLIPTIN HCL 2.5 MG PO TABS: 2.5000 mg | ORAL_TABLET | Freq: Every day | ORAL | Status: DC

## 2012-08-11 NOTE — Telephone Encounter (Signed)
Received message from pt requesting a 2 week supply of effexor be sent to [facility] as it doesn't appear mail order supply will arrive in time. Also requests samples of onglyza and livalo. Livalo samples already at front desk, currently out of onglyza samples in the office and will send 2 week supply to pharmacy.

## 2012-08-11 NOTE — Telephone Encounter (Signed)
Received fax from [facility] re: prior auth for pt's Livalo. Form completed and faxed back to [phone]. Awaiting approval / denial status.

## 2012-08-11 NOTE — Addendum Note (Signed)
Addended by: Court Joy on: 08/11/2012 04:38 PM   Modules accepted: Orders

## 2012-08-12 NOTE — Telephone Encounter (Signed)
Received denial from [facility] re: pt's Livalo. Will need to proceed with appeal process. Left message for pt to return my call. Need to know specific names of cholesterol medications tried in the past.

## 2012-08-12 NOTE — Telephone Encounter (Signed)
Pt returned my call and states that she has tried lipitor and crestor in the past and both medications caused her liver functions to elevate. Pt currently has samples and will continue them until further notice from [place]. Do you want [place] to do a letter for appeal to insurance co.?

## 2012-08-13 NOTE — Telephone Encounter (Signed)
Yes try an appeal stating patient failed Lipitor and Crestor in past so needs livalo

## 2012-08-18 NOTE — Telephone Encounter (Signed)
Letter has been mailed to Attn: Licensed conveyancer.  [employer]. [address] [id number]  [place], [place] [address].  Awaiting decision.

## 2012-08-19 ENCOUNTER — Other Ambulatory Visit: Payer: Self-pay

## 2012-08-27 NOTE — Telephone Encounter (Signed)
Heather with [employer] called to advise they have received the appeal and it will be complete by Tuesday/

## 2012-08-31 ENCOUNTER — Telehealth: Payer: Self-pay | Admitting: *Deleted

## 2012-08-31 MED ORDER — SAXAGLIPTIN HCL 2.5 MG PO TABS: 2.5000 mg | ORAL_TABLET | Freq: Every day | ORAL | Status: DC

## 2012-08-31 MED ORDER — PITAVASTATIN CALCIUM 2 MG PO TABS: 1.0000 | ORAL_TABLET | Freq: Every day | ORAL | Status: DC

## 2012-08-31 NOTE — Telephone Encounter (Signed)
Received fax from [facility] with approval for Livalo 2mg  90/90 until 02/09/13.  Refill sent to [facility] and pt's husband notified.

## 2012-08-31 NOTE — Telephone Encounter (Signed)
Received call from pt requesting samples of onglyza. Advised her we are out of 2.5mg  samples. Pt requests rx to [facility] and states she still has some medication on hand. Rx sent.

## 2012-08-31 NOTE — Telephone Encounter (Signed)
Pt called to request status of Livalo P.A. Advised pt of below and status is currently pending.

## 2012-09-03 ENCOUNTER — Telehealth: Payer: Self-pay | Admitting: *Deleted

## 2012-09-03 DIAGNOSIS — E785 Hyperlipidemia, unspecified: Secondary | ICD-10-CM

## 2012-09-03 MED ORDER — ATORVASTATIN CALCIUM 20 MG PO TABS: 20.0000 mg | ORAL_TABLET | Freq: Every day | ORAL | Status: DC

## 2012-09-03 NOTE — Telephone Encounter (Signed)
Notified pt and she is agreeable to try atorvastatin again but remains concerned about possibility of elevated LFTs. Advised pt that LFTs were normal per 06/2012 lab result. Rx sent to [facility] and future lab order entered. Pt will return to the lab in 1 month.

## 2012-09-03 NOTE — Telephone Encounter (Signed)
Received message from pt stating [facility] contacted her about the cost of Livalo ($256) for 3 month supply. Spoke with representative at [facility] and verified this is pt's copay after prior auth approval.  Pt states she will be unable to afford this.  Is there something else she can try or do we keep giving her samples?

## 2012-09-03 NOTE — Telephone Encounter (Signed)
We can try switching to atorvastatin 20mg  once daily in place of livalo with plan to keep close eye on LFT.  Recheck LFT and FLP in 1 month.

## 2012-09-08 ENCOUNTER — Telehealth: Payer: Self-pay | Admitting: *Deleted

## 2012-09-08 NOTE — Telephone Encounter (Signed)
Received message from pt that she will be unable to afford onglyza and is requesting a cheaper alternative?  Please advise.

## 2012-09-08 NOTE — Telephone Encounter (Signed)
She can stop onglyza and continue metformin. We will see how her sugar dose on metformin alone.

## 2012-09-09 NOTE — Telephone Encounter (Signed)
Left message with pt's husband to return my call.  

## 2012-09-10 NOTE — Telephone Encounter (Signed)
Notified pt and she voices understanding. Wanted to know why she has appt on 09/14/12. Upon review of EPIC it appears pt is due for a 3 month f/u in September and will also be due for repeat lipids, lfts. Appt r/s for 10/15/12 at 9:45am and advised pt to be fasting.

## 2012-09-14 ENCOUNTER — Ambulatory Visit: Payer: Medicare Other | Admitting: Family

## 2012-09-15 ENCOUNTER — Telehealth: Payer: Self-pay | Admitting: *Deleted

## 2012-09-15 NOTE — Telephone Encounter (Signed)
She should continue current dose of metformin.  Most recent A1C was excellent. We will repeat A1C in 3 months and can make further adjustments at that time.

## 2012-09-15 NOTE — Telephone Encounter (Signed)
Received message from pt wanting to know if we should increase her metformin dose since she is off of onglyza now. States we recently had her to reduce the dose while on the onglyza.  Pt is concerned that her current dose may not be enough on its on.  Please advise.

## 2012-09-16 NOTE — Telephone Encounter (Signed)
Notified pt and she voices understanding. 

## 2012-10-15 ENCOUNTER — Telehealth: Payer: Self-pay | Admitting: *Deleted

## 2012-10-15 ENCOUNTER — Encounter: Payer: Self-pay | Admitting: Family

## 2012-10-15 ENCOUNTER — Ambulatory Visit (INDEPENDENT_AMBULATORY_CARE_PROVIDER_SITE_OTHER): Payer: Medicare Other | Admitting: Family

## 2012-10-15 VITALS — BP 145/98 | HR 79 | Temp 97.9°F | Resp 18 | Ht 63.5 in | Wt 180.0 lb

## 2012-10-15 DIAGNOSIS — E785 Hyperlipidemia, unspecified: Secondary | ICD-10-CM | POA: Diagnosis not present

## 2012-10-15 DIAGNOSIS — Z23 Encounter for immunization: Secondary | ICD-10-CM | POA: Diagnosis not present

## 2012-10-15 DIAGNOSIS — F3289 Other specified depressive episodes: Secondary | ICD-10-CM

## 2012-10-15 DIAGNOSIS — E119 Type 2 diabetes mellitus without complications: Secondary | ICD-10-CM

## 2012-10-15 DIAGNOSIS — H8110 Benign paroxysmal vertigo, unspecified ear: Secondary | ICD-10-CM

## 2012-10-15 DIAGNOSIS — F329 Major depressive disorder, single episode, unspecified: Secondary | ICD-10-CM

## 2012-10-15 DIAGNOSIS — B354 Tinea corporis: Secondary | ICD-10-CM

## 2012-10-15 DIAGNOSIS — I10 Essential (primary) hypertension: Secondary | ICD-10-CM

## 2012-10-15 DIAGNOSIS — F32A Depression, unspecified: Secondary | ICD-10-CM

## 2012-10-15 LAB — HEPATIC FUNCTION PANEL
AST: 22 U/L (ref 0–37)
Bilirubin, Direct: 0.1 mg/dL (ref 0.0–0.3)
Indirect Bilirubin: 0.8 mg/dL (ref 0.0–0.9)
Total Bilirubin: 0.9 mg/dL (ref 0.3–1.2)

## 2012-10-15 LAB — BASIC METABOLIC PANEL
CO2: 27 mEq/L (ref 19–32)
Calcium: 10.4 mg/dL (ref 8.4–10.5)
Chloride: 100 mEq/L (ref 96–112)
Glucose, Bld: 157 mg/dL — ABNORMAL HIGH (ref 70–99)
Potassium: 4.9 mEq/L (ref 3.5–5.3)
Sodium: 137 mEq/L (ref 135–145)

## 2012-10-15 MED ORDER — ACCU-CHEK AVIVA PLUS W/DEVICE KIT: 1.0000 | PACK | Freq: Every day | Status: DC

## 2012-10-15 MED ORDER — TERBINAFINE HCL 1 % EX CREA: TOPICAL_CREAM | Freq: Two times a day (BID) | CUTANEOUS | Status: DC

## 2012-10-15 MED ORDER — GLUCOSE BLOOD VI STRP: ORAL_STRIP | Status: DC

## 2012-10-15 NOTE — Telephone Encounter (Signed)
She should check sugar 1-2 times daily. See handwritten rx for glucometer.

## 2012-10-15 NOTE — Patient Instructions (Addendum)
Stop lipitor x 2 weeks. Start pravastatin in 2 weeks.  Call if you developed recurrent muscle pain after you start pravastatin.   Complete lab work prior to leaving.  Follow up in 6 weeks so we can repeat your blood pressure and fasting lab work.

## 2012-10-15 NOTE — Telephone Encounter (Signed)
Pt requested rxs for glucometer and BP machine. Thinks her insurance will pay for these with a script. How many times should pt test glucose daily?

## 2012-10-15 NOTE — Progress Notes (Signed)
Subjective:    Patient ID: Kimberly Lin, female    DOB: [birth year], 67 y.o.   MRN: [id number]  HPI  Kimberly Lin is a 67 yr old female who presents today for follow up of multiple medical problems:  1) Depression- she is currently maintained on effexor xr.  Reports that she feels well on the effexor.   2) HTN- currently on losartan and metoprolol.  BP today is 145/98. She attributes the elevated BP to "pain."    3) Hyperlipidemia-  She is maintained on lipitor.  Last LDL in May was at goal at 19.  Reports that her arms/legs and neck ache.  4) Vertigo- she continues to have dizziness and has had several falls at home.  She was referred to the vestibular clinic but has not followed through with this referral.   5) Skin rash- reports circular patches for a few months. Started after she went to [employer].  Husband has a similar spot.    6) DM2- she is maintained on metformin.  She lost her glucose meter and would like rx for a new meter. Not checking her sugars regular.    Review of Systems Past Medical History  Diagnosis Date  . Arthritis     osteoarthritis  . Fibromyalgia   . History of chicken pox   . Depression   . Diabetes mellitus   . GERD (gastroesophageal reflux disease)   . Hyperlipidemia   . Hypertension   . Migraine   . Personal history of colonic polyps   . History of septic shock 12/2009  . Spinal stenosis   . OSA (obstructive sleep apnea) 03/12/2011    History   Social History  . Marital Status: Married    Spouse Name: N/A    Number of Children: 2  . Years of Education: N/A   Occupational History  . Not on file.   Social History Main Topics  . Smoking status: Former Games developer  . Smokeless tobacco: Never Used     Comment: Smoked socially.  . Alcohol Use: 3.6 - 4.8 oz/week    6-8 Glasses of wine per week  . Drug Use: No  . Sexual Activity: Not on file   Other Topics Concern  . Not on file   Social History Narrative   Regular exercise:  No   Caffeine use: 1 cup daily          Past Surgical History  Procedure Laterality Date  . Breast biopsy  2007, 2009    right breast  . Appendectomy  1983  . Joint replacement  2006    bilateral partial knee replacement  . Cystectomy  2008    Pt states she had a ?cyst removed ?ovaries  . Replacement total knee      Family History  Problem Relation Age of Onset  . Cancer Mother     breast and lung  . Arthritis Mother   . Hyperlipidemia Mother   . Hypertension Mother   . Diabetes Mother   . Heart disease Mother   . Cancer Brother     lung, prostate  . Heart disease Brother   . Hypertension Brother   . Hyperlipidemia Brother   . Diabetes Brother   . Sudden death Father   . Diabetes Sister   . Hyperlipidemia Sister   . Hypertension Sister   . Diabetes Maternal Aunt   . Heart attack Maternal Aunt   . Hyperlipidemia Maternal Aunt   . Hypertension Maternal Aunt   .  Hypertension Maternal Uncle   . Hyperlipidemia Maternal Uncle   . Heart attack Maternal Uncle   . Diabetes Maternal Uncle   . Diabetes Paternal Aunt   . Heart attack Paternal Aunt   . Hyperlipidemia Paternal Aunt   . Hypertension Paternal Aunt   . Hypertension Paternal Uncle   . Hyperlipidemia Paternal Uncle   . Heart attack Paternal Uncle   . Diabetes Paternal Uncle     Allergies  Allergen Reactions  . Prednisone Other (See Comments)    "Insomnia"    Current Outpatient Prescriptions on File Prior to Visit  Medication Sig Dispense Refill  . ALPRAZolam (XANAX) 1 MG tablet TAKE ONE TABLET BY MOUTH TWICE DAILY AS NEEDED  180 tablet  1  . aspirin 81 MG tablet Take 81 mg by mouth daily.        Marland Kitchen atorvastatin (LIPITOR) 20 MG tablet Take 1 tablet (20 mg total) by mouth daily.  90 tablet  1  . Calcium Carbonate-Vitamin D (CALCIUM-D) 600-400 MG-UNIT TABS Take 1 tablet by mouth daily.       . Cholecalciferol (D-3-5) 5000 UNITS capsule Take 5,000 Units by mouth daily.        Marland Kitchen co-enzyme Q-10 30 MG capsule Take  100 mg by mouth daily.        . diphenhydrAMINE (BENADRYL) 25 MG tablet Take 25 mg by mouth every 6 (six) hours as needed.      Marland Kitchen losartan (COZAAR) 25 MG tablet Take 1 tablet (25 mg total) by mouth daily.  30 tablet  2  . metFORMIN (GLUCOPHAGE) 500 MG tablet TAKE TWO TABLETS BY MOUTH TWICE DAILY WITH  MEALS  360 tablet  0  . metoprolol (LOPRESSOR) 50 MG tablet TAKE ONE-HALF TABLET BY MOUTH TWICE DAILY  90 tablet  0  . Multiple Vitamin (MULTIVITAMIN) tablet Take 1 tablet by mouth daily.        . Omega-3 Fatty Acids (FISH OIL) 1000 MG CAPS 2 caps by mouth twice daily    0  . pantoprazole (PROTONIX) 40 MG tablet TAKE ONE TABLET BY MOUTH EVERY DAY  90 tablet  0  . Pitavastatin Calcium (LIVALO) 2 MG TABS Take 1 tablet (2 mg total) by mouth daily.  90 tablet  1  . venlafaxine XR (EFFEXOR-XR) 75 MG 24 hr capsule TAKE TWO CAPSULES BY MOUTH ONCE DAILY  28 capsule  0  . zolpidem (AMBIEN) 10 MG tablet Take 1 tablet (10 mg total) by mouth at bedtime as needed for sleep.  90 tablet  1   No current facility-administered medications on file prior to visit.    BP 145/98  Pulse 79  Temp(Src) 97.9 F (36.6 C) (Oral)  Resp 18  Ht 5' 3.5" (1.613 m)  Wt 180 lb 0.6 oz (81.666 kg)  BMI 31.39 kg/m2  SpO2 97%       Objective:   Physical Exam  Constitutional: She is oriented to person, place, and time. She appears well-developed and well-nourished. No distress.  HENT:  Head: Normocephalic and atraumatic.  Cardiovascular: Normal rate and regular rhythm.   Pulmonary/Chest: Effort normal and breath sounds normal. No respiratory distress. She has no wheezes. She has no rales. She exhibits no tenderness.  Musculoskeletal: She exhibits no edema.  Neurological: She is alert and oriented to person, place, and time.  Skin:  Annular hyperpigmented lesions noted on right flank  Psychiatric: She has a normal mood and affect. Her behavior is normal. Judgment and thought content normal.  Assessment &  Plan:

## 2012-10-15 NOTE — Telephone Encounter (Signed)
Rxs for glucometer and home BP monitor faxed to [facility]. Accuchek glucometer rx sent electronically as well. Notified pt.

## 2012-10-17 ENCOUNTER — Encounter: Payer: Self-pay | Admitting: Family

## 2012-10-18 DIAGNOSIS — B354 Tinea corporis: Secondary | ICD-10-CM | POA: Insufficient documentation

## 2012-10-18 NOTE — Assessment & Plan Note (Addendum)
Not tolerating lipitor due to myalgia.  Will D/C lipitor x 2 weeks, then plan to attempt pravastatin to see if she can better tolerate.

## 2012-10-18 NOTE — Assessment & Plan Note (Signed)
Trial of lamisil cream

## 2012-10-18 NOTE — Assessment & Plan Note (Signed)
Clinically stable. Obtain A1C and BMET.

## 2012-10-18 NOTE — Assessment & Plan Note (Signed)
Will refer for vestibular rehab.

## 2012-10-18 NOTE — Assessment & Plan Note (Signed)
BP up today. Pt attributes to pain.  Continue current meds.  If elevated next visit, consider increasing BP meds.

## 2012-10-18 NOTE — Assessment & Plan Note (Signed)
Stable on effexor, continue same. 

## 2012-10-19 ENCOUNTER — Ambulatory Visit: Payer: Medicare Other | Attending: Family | Admitting: Rehabilitation

## 2012-10-19 DIAGNOSIS — IMO0001 Reserved for inherently not codable concepts without codable children: Secondary | ICD-10-CM | POA: Insufficient documentation

## 2012-10-19 DIAGNOSIS — Z9181 History of falling: Secondary | ICD-10-CM | POA: Insufficient documentation

## 2012-10-19 DIAGNOSIS — R42 Dizziness and giddiness: Secondary | ICD-10-CM | POA: Diagnosis not present

## 2012-10-19 MED ORDER — GLUCOSE BLOOD VI STRP: ORAL_STRIP | Status: DC

## 2012-10-19 NOTE — Addendum Note (Signed)
Addended by: Mervin Kung A on: 10/19/2012 03:03 PM   Modules accepted: Orders

## 2012-10-19 NOTE — Telephone Encounter (Addendum)
Pt left message that incorrect test strips were sent for glucometer. Re-sent correct strips. Pt also asked about BP monitor. Re-sent correct strips. Notified pt and advised her that handwritten Rx was faxed previously for "BP monitor" and to see if the pharmacy received it. If rx is needed with specific brand of monitor advised pt to call me back with the brand of her choice. Pt voices understanding.

## 2012-10-27 ENCOUNTER — Telehealth: Payer: Self-pay | Admitting: *Deleted

## 2012-10-27 NOTE — Telephone Encounter (Signed)
Received message from pt stating she has been having pain all over and did not really discuss this at her last visit. States she has pain in her arms, legs, hands and shoulders. Not sure if this is from arthritis or fibromyalgia or both? Has been taking ibuprofen without relief of pain.  Pt wants to know what else you might recommend and what do you thinks is causing her pain?  Please advise.

## 2012-10-28 NOTE — Telephone Encounter (Signed)
I am not sure what is causing her pain at this point.  I would like to see her back in the office to further evaluate please.

## 2012-10-29 NOTE — Telephone Encounter (Signed)
Notified pt's husband and he voices understanding and will notify pt.

## 2012-11-02 ENCOUNTER — Telehealth: Payer: Self-pay | Admitting: *Deleted

## 2012-11-02 MED ORDER — ACCU-CHEK MULTICLIX LANCETS MISC: Status: DC

## 2012-11-02 NOTE — Telephone Encounter (Signed)
Received message from pt that pharmacy did not receive rx for lancets. Apologized to pt for my error and rx sent.

## 2012-11-08 ENCOUNTER — Telehealth: Payer: Self-pay | Admitting: *Deleted

## 2012-11-08 DIAGNOSIS — R42 Dizziness and giddiness: Secondary | ICD-10-CM

## 2012-11-08 NOTE — Telephone Encounter (Signed)
Notified pt and scheduled visit for tomorrow at 2:30pm.

## 2012-11-08 NOTE — Telephone Encounter (Signed)
Pt called stating she was at the [employer] this weekend. Was looking at different flowers when she became very light headed and fell back into some flowers. States she did not hit her head. States she has never fallen backward before. States she did not lose consciousness but was a little disoriented for a few minutes after the fall.  Reports headache since the fall. Notes dizziness when she bends over or tilts her head back. Pt states she is not continuing with the vestibular clinic as she did not feel they could help her. Wonders if she should see a neurologist?  Please advise.

## 2012-11-08 NOTE — Telephone Encounter (Signed)
I am happy to set her up with a neurologist, but lets bring her back into the office for re-evaluation.  I think we should do some additional imaging such as an MRI to further evaluate prior to sending to neuro.

## 2012-11-09 ENCOUNTER — Encounter: Payer: Self-pay | Admitting: Family

## 2012-11-09 ENCOUNTER — Ambulatory Visit (INDEPENDENT_AMBULATORY_CARE_PROVIDER_SITE_OTHER): Payer: Medicare Other | Admitting: Family

## 2012-11-09 VITALS — BP 180/108 | HR 76 | Temp 98.1°F | Resp 16 | Ht 63.5 in | Wt 185.0 lb

## 2012-11-09 DIAGNOSIS — R42 Dizziness and giddiness: Secondary | ICD-10-CM

## 2012-11-09 DIAGNOSIS — H8110 Benign paroxysmal vertigo, unspecified ear: Secondary | ICD-10-CM | POA: Diagnosis not present

## 2012-11-09 MED ORDER — AMLODIPINE BESYLATE 5 MG PO TABS: 5.0000 mg | ORAL_TABLET | Freq: Every day | ORAL | Status: DC

## 2012-11-09 NOTE — Assessment & Plan Note (Signed)
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 I have encouraged her to follow through with vestibular rehab and she tells me she will. Recent confusion following her fall the other day is concerning. I have advised her to complete MRI brain, need to rule out TIA/CVA. Will refer to neurology as well at pt request.  She is instructed to go to the ED if she develops numbness/weakness or slurred speech and she verbalizes understanding.

## 2012-11-09 NOTE — Progress Notes (Signed)
Subjective:    Patient ID: Kimberly Lin, female    DOB: March 15, 1945, 67 y.o.   MRN: [id number]  HPI  Kimberly Lin is a 67 yr old female who presents today with chief complaint of ongoing dizziness.  She reports that she had a fall on Saturday while at the [employer].  Reports that she felt dizzy and suddenly fell backwards. Was confused following the episode.  Only attended one session of vestibular rehab.  Would like referral to neurology.    Reports intermittent dizziness today.  Reports + hx of migraines.  Has some pressure in the back of her neck.    Review of Systems See HPI  Past Medical History  Diagnosis Date  . Arthritis     osteoarthritis  . Fibromyalgia   . History of chicken pox   . Depression   . Diabetes mellitus   . GERD (gastroesophageal reflux disease)   . Hyperlipidemia   . Hypertension   . Migraine   . Personal history of colonic polyps   . History of septic shock 12/2009  . Spinal stenosis   . OSA (obstructive sleep apnea) 03/12/2011    History   Social History  . Marital Status: Married    Spouse Name: N/A    Number of Children: 2  . Years of Education: N/A   Occupational History  . Not on file.   Social History Main Topics  . Smoking status: Former Games developer  . Smokeless tobacco: Never Used     Comment: Smoked socially.  . Alcohol Use: 3.6 - 4.8 oz/week    6-8 Glasses of wine per week  . Drug Use: No  . Sexual Activity: Not on file   Other Topics Concern  . Not on file   Social History Narrative   Regular exercise:  No   Caffeine use: 1 cup daily          Past Surgical History  Procedure Laterality Date  . Breast biopsy  2007, 2009    right breast  . Appendectomy  1983  . Joint replacement  2006    bilateral partial knee replacement  . Cystectomy  2008    Pt states she had a ?cyst removed ?ovaries  . Replacement total knee      Family History  Problem Relation Age of Onset  . Cancer Mother     breast and lung  .  Arthritis Mother   . Hyperlipidemia Mother   . Hypertension Mother   . Diabetes Mother   . Heart disease Mother   . Cancer Brother     lung, prostate  . Heart disease Brother   . Hypertension Brother   . Hyperlipidemia Brother   . Diabetes Brother   . Sudden death Father   . Diabetes Sister   . Hyperlipidemia Sister   . Hypertension Sister   . Diabetes Maternal Aunt   . Heart attack Maternal Aunt   . Hyperlipidemia Maternal Aunt   . Hypertension Maternal Aunt   . Hypertension Maternal Uncle   . Hyperlipidemia Maternal Uncle   . Heart attack Maternal Uncle   . Diabetes Maternal Uncle   . Diabetes Paternal Aunt   . Heart attack Paternal Aunt   . Hyperlipidemia Paternal Aunt   . Hypertension Paternal Aunt   . Hypertension Paternal Uncle   . Hyperlipidemia Paternal Uncle   . Heart attack Paternal Uncle   . Diabetes Paternal Uncle     Allergies  Allergen Reactions  . Prednisone  Other (See Comments)    "Insomnia"    Current Outpatient Prescriptions on File Prior to Visit  Medication Sig Dispense Refill  . ALPRAZolam (XANAX) 1 MG tablet TAKE ONE TABLET BY MOUTH TWICE DAILY AS NEEDED  180 tablet  1  . aspirin 81 MG tablet Take 81 mg by mouth daily.        . Calcium Carbonate-Vitamin D (CALCIUM-D) 600-400 MG-UNIT TABS Take 1 tablet by mouth daily.       . Cholecalciferol (D-3-5) 5000 UNITS capsule Take 5,000 Units by mouth daily.        Marland Kitchen co-enzyme Q-10 30 MG capsule Take 100 mg by mouth daily.        . diphenhydrAMINE (BENADRYL) 25 MG tablet Take 25 mg by mouth every 6 (six) hours as needed.      Marland Kitchen glucose blood (ACCU-CHEK AVIVA) test strip Use as instructed to check blood sugar 1-2 times daily.  Dx 250.00.  100 each  3  . Lancets (ACCU-CHEK MULTICLIX) lancets Use as instructed to check blood sugar two times daily.  Dx 250.00  100 each  2  . losartan (COZAAR) 25 MG tablet Take 1 tablet (25 mg total) by mouth daily.  30 tablet  2  . metFORMIN (GLUCOPHAGE) 500 MG tablet TAKE  TWO TABLETS BY MOUTH TWICE DAILY WITH  MEALS  360 tablet  0  . metoprolol (LOPRESSOR) 50 MG tablet TAKE ONE-HALF TABLET BY MOUTH TWICE DAILY  90 tablet  0  . Multiple Vitamin (MULTIVITAMIN) tablet Take 1 tablet by mouth daily.        . Omega-3 Fatty Acids (FISH OIL) 1000 MG CAPS 2 caps by mouth twice daily    0  . pantoprazole (PROTONIX) 40 MG tablet TAKE ONE TABLET BY MOUTH EVERY DAY  90 tablet  0  . terbinafine (LAMISIL AT) 1 % cream Apply topically 2 (two) times daily.  30 g  0  . venlafaxine XR (EFFEXOR-XR) 75 MG 24 hr capsule TAKE TWO CAPSULES BY MOUTH ONCE DAILY  28 capsule  0  . zolpidem (AMBIEN) 10 MG tablet Take 1 tablet (10 mg total) by mouth at bedtime as needed for sleep.  90 tablet  1  . atorvastatin (LIPITOR) 20 MG tablet Take 1 tablet (20 mg total) by mouth daily.  90 tablet  1   No current facility-administered medications on file prior to visit.    BP 180/108  Pulse 76  Temp(Src) 98.1 F (36.7 C) (Oral)  Resp 16  Ht 5' 3.5" (1.613 m)  Wt 185 lb (83.915 kg)  BMI 32.25 kg/m2  SpO2 99%       Objective:   Physical Exam  Constitutional: She is oriented to person, place, and time. She appears well-developed and well-nourished. No distress.  Eyes: EOM are normal.  Cardiovascular: Normal rate and regular rhythm.   No murmur heard. Pulmonary/Chest: Effort normal and breath sounds normal. No respiratory distress. She has no wheezes. She has no rales. She exhibits no tenderness.  Musculoskeletal: She exhibits no edema.  Neurological: She is alert and oriented to person, place, and time.  + facial symmetry Speech is clear Bilateral UE/LE strength is 5/5          Assessment & Plan:   BP Readings from Last 3 Encounters:  11/09/12 180/108  10/15/12 145/98  07/19/12 120/72

## 2012-11-09 NOTE — Patient Instructions (Addendum)
You will be contacted about your referral to neurology and for your MRI. Start amlodipine 5mg  once daily today. Go to ER if you develop numbness/weakness, slurred speech. Follow up in 2 weeks.

## 2012-11-13 ENCOUNTER — Ambulatory Visit (HOSPITAL_BASED_OUTPATIENT_CLINIC_OR_DEPARTMENT_OTHER)
Admission: RE | Admit: 2012-11-13 | Discharge: 2012-11-13 | Disposition: A | Discharge status: Home / Self Care | Payer: Medicare Other | Source: Ambulatory Visit | Attending: Family | Admitting: Family

## 2012-11-13 DIAGNOSIS — I10 Essential (primary) hypertension: Secondary | ICD-10-CM | POA: Diagnosis not present

## 2012-11-13 DIAGNOSIS — E785 Hyperlipidemia, unspecified: Secondary | ICD-10-CM | POA: Diagnosis not present

## 2012-11-13 DIAGNOSIS — Z9181 History of falling: Secondary | ICD-10-CM | POA: Insufficient documentation

## 2012-11-13 DIAGNOSIS — E119 Type 2 diabetes mellitus without complications: Secondary | ICD-10-CM | POA: Insufficient documentation

## 2012-11-13 DIAGNOSIS — G311 Senile degeneration of brain, not elsewhere classified: Secondary | ICD-10-CM | POA: Diagnosis not present

## 2012-11-13 DIAGNOSIS — R42 Dizziness and giddiness: Secondary | ICD-10-CM | POA: Diagnosis not present

## 2012-11-15 DIAGNOSIS — M545 Low back pain, unspecified: Secondary | ICD-10-CM | POA: Diagnosis not present

## 2012-11-15 DIAGNOSIS — M47817 Spondylosis without myelopathy or radiculopathy, lumbosacral region: Secondary | ICD-10-CM | POA: Diagnosis not present

## 2012-11-22 ENCOUNTER — Ambulatory Visit (INDEPENDENT_AMBULATORY_CARE_PROVIDER_SITE_OTHER): Payer: Medicare Other | Admitting: Family

## 2012-11-22 ENCOUNTER — Encounter: Payer: Self-pay | Admitting: Family

## 2012-11-22 VITALS — BP 150/90 | HR 71 | Temp 97.8°F | Resp 16 | Ht 63.5 in | Wt 185.0 lb

## 2012-11-22 DIAGNOSIS — R0600 Dyspnea, unspecified: Secondary | ICD-10-CM

## 2012-11-22 DIAGNOSIS — H8110 Benign paroxysmal vertigo, unspecified ear: Secondary | ICD-10-CM | POA: Diagnosis not present

## 2012-11-22 DIAGNOSIS — R0609 Other forms of dyspnea: Secondary | ICD-10-CM | POA: Diagnosis not present

## 2012-11-22 DIAGNOSIS — I10 Essential (primary) hypertension: Secondary | ICD-10-CM | POA: Diagnosis not present

## 2012-11-22 MED ORDER — HYDROCHLOROTHIAZIDE 25 MG PO TABS: 25.0000 mg | ORAL_TABLET | Freq: Every day | ORAL | Status: DC

## 2012-11-22 NOTE — Assessment & Plan Note (Signed)
Patient reporting shortness of breath upon exertion, and uses multiple pillows at night.  Add HCTZ for fluid retention, follow up in three weeks.  Repeat potasisum lab at that time.

## 2012-11-22 NOTE — Assessment & Plan Note (Signed)
Patient has not returned to the vestibular rehab clinic.  Patient would like referral to ENT.

## 2012-11-22 NOTE — Assessment & Plan Note (Addendum)
Patient taking Amlodipine and Metoprolol. BP today 150/90.  Continue taking medications add HCTZ for shortness of breath, follow up in three weeks.

## 2012-11-22 NOTE — Progress Notes (Signed)
  Subjective:    Patient ID: Kimberly Lin, female    DOB: 1946-01-18, 67 y.o.   MRN: [id number]  HPI Kimberly Lin is a 67 year old female who presents today for follow up of hypertension.  Hypertension) Patient reports she has been taking Amlodipine and Metoprolol, denies chest pain, syncope, headaches. Blood pressure today 150/90.  Shortness of breath) Patient reports SOB upon exertion and reports using multiple pillows at night.  Dizziness) Patient reports consistent dizziness without worsening symptoms, patient has not been back to vestibular rehab center and is requesting an ENT referral.    Review of Systems  Constitutional: Positive for fatigue.       Patient reports increased fatigue over past month.  HENT: Negative for hearing loss.   Respiratory: Positive for shortness of breath. Negative for cough.        Reports intermittent SOB upon exertion x 1 year.   Cardiovascular: Negative for chest pain.  Gastrointestinal: Negative for nausea, vomiting, abdominal pain and diarrhea.       Reporting sudden onset of sharp, LLQ abdominal pain last night that woke patient during sleep. Patient denies pain at this time. Denies urinary symptoms. Denies nausea, vomiting, diarrhea.  Genitourinary: Negative for dysuria and difficulty urinating.  Musculoskeletal: Positive for joint swelling. Negative for myalgias.       Patient reports osteoarthritis   Neurological: Positive for dizziness and light-headedness. Negative for syncope.  Psychiatric/Behavioral: Negative.        Objective:   Physical Exam  Constitutional: She is oriented to person, place, and time. She appears well-nourished.  HENT:  Right Ear: External ear normal.  Left Ear: External ear normal.  Mouth/Throat: Oropharynx is clear and moist.  Eyes: Pupils are equal, round, and reactive to light.  Neck: Neck supple.  Cardiovascular: Normal rate, regular rhythm and normal heart sounds.   Pulmonary/Chest: Effort normal and  breath sounds normal. No respiratory distress. She has no wheezes.  Abdominal: Soft. Bowel sounds are normal. There is tenderness.  Tender to LLQ upon deep palpation.  Neurological: She is alert and oriented to person, place, and time.  Skin: Skin is warm and dry.  Psychiatric: She has a normal mood and affect.          Assessment & Plan:

## 2012-11-22 NOTE — Patient Instructions (Signed)
Start HCTZ once daily. Follow up in 3 weeks.

## 2012-11-22 NOTE — Progress Notes (Signed)
  Subjective:    Patient ID: Kimberly Lin, female    DOB: 20-May-1945, 67 y.o.   MRN: [id number]  HPI    Review of Systems     Objective:   Physical Exam        Assessment & Plan:  I have seen and examined Ms. Constantine and agree with Ms. Ophelia Charter assessment and plan.

## 2012-11-23 ENCOUNTER — Ambulatory Visit: Payer: Medicare Other | Admitting: Family

## 2012-11-24 ENCOUNTER — Telehealth: Payer: Self-pay | Admitting: *Deleted

## 2012-11-24 NOTE — Telephone Encounter (Signed)
Received message from pt that she was contacted about a referral to a neurologist and she doesn't remember being told that she was being referred to one. Upon review of EPIC I see that pt called re: concerns of continued dizziness and questioned need for neurology referral on 11/08/12. Appt was scheduled on 11/09/12 to discuss and evaluate and referral was made at that time. Left detailed message on pt's cell# re: reason for referral and to call and let us know how she wants to proceed.

## 2012-11-26 ENCOUNTER — Ambulatory Visit: Payer: Medicare Other | Admitting: Family

## 2012-11-29 ENCOUNTER — Encounter: Payer: Self-pay | Admitting: Neurology

## 2012-11-29 ENCOUNTER — Ambulatory Visit (INDEPENDENT_AMBULATORY_CARE_PROVIDER_SITE_OTHER): Payer: Medicare Other | Admitting: Neurology

## 2012-11-29 VITALS — BP 140/80 | HR 80 | Temp 98.2°F | Ht 63.5 in | Wt 185.0 lb

## 2012-11-29 DIAGNOSIS — R42 Dizziness and giddiness: Secondary | ICD-10-CM | POA: Diagnosis not present

## 2012-11-29 DIAGNOSIS — I679 Cerebrovascular disease, unspecified: Secondary | ICD-10-CM | POA: Diagnosis not present

## 2012-11-29 DIAGNOSIS — H8110 Benign paroxysmal vertigo, unspecified ear: Secondary | ICD-10-CM

## 2012-11-29 NOTE — Patient Instructions (Addendum)
1.  I recommend going back to vestibular rehab. 2.  Follow up with ear, nose and throat. 3.  We will get carotid dopplers since you do have stroke risk factors.   Your carotid doppler study is scheduled for Thursday, October 23rd at [facility] located at [facility] in [place] at 3:30 pm. Please arrive 15 minutes prior to your appointment.   [phone]

## 2012-11-29 NOTE — Progress Notes (Signed)
NEUROLOGY CONSULTATION NOTE  Kimberly Lin MRN: [id number] DOB: 19-Aug-1945  Referring provider: Sandford Craze, NP Primary care provider: Sandford Craze, NP  Reason for consult:  Dizziness  HISTORY OF PRESENT ILLNESS: Kimberly Lin is a 67 year old right-handed woman with osteoarthritis, type II diabetes mellitus, hypertension, hyperlipidemia, spinal stenosis, obesity, fibromyalgia, migraine, OSA and spinal stenosis who presents for dizziness..  Records and images were personally reviewed where available.    Symptoms have been ongoing for at least a year.  She describes spinning sensation triggered by change in head or body position, such as looking up, bending forward, turning gaze side to side or turning head in bed.  It is not associated with nausea.  It lasts only several seconds (up to a minute at the most).  There is no associated vision change, facial numbness, dysphagia, slurred speech or focal weakness.  She denies hearing loss and tinnitus, but says it feels like "air shooting out of my ears".  She was referred to vestibular rehab.  She went to initial assessment but never went back for therapy.  She also has history of falls.  It usually occurs in context of tripping over something in the middle of the night and having taken sleeping aids.  In April 2014, she tripped and fell in her house, striking her head on the TV stand.  She denied loss of consciousness but was briefly confused after the incident.  CT head from 05/25/12 was negative for bleed.  A couple of weekends ago, she had a different fall at the [employer], associated with dizziness.  She was looking back and forth at flowers when she suddenly fell backwards.  An MRI of the brain without contrast was performed on 11/22/12, which revealed chronic small vessel ischemic changes involving the periventricular white matter as well as midbrain and pons.  She does have remote history of vertigo in her 83s.  She tried  meclizine at that time, but not recently.  She takes Xanax to help her sleep and she takes Benadryl for nasal congestion, but not for dizziness.  She has an upcoming appointment with ENT.  10/15/12: Hgb A1c 6.4 06/15/12:  LDL 83, TG 333.  PAST MEDICAL HISTORY: Past Medical History  Diagnosis Date  . Arthritis     osteoarthritis  . Fibromyalgia   . History of chicken pox   . Depression   . Diabetes mellitus   . GERD (gastroesophageal reflux disease)   . Hyperlipidemia   . Hypertension   . Migraine   . Personal history of colonic polyps   . History of septic shock 12/2009  . Spinal stenosis   . OSA (obstructive sleep apnea) 03/12/2011    PAST SURGICAL HISTORY: Past Surgical History  Procedure Laterality Date  . Breast biopsy  2007, 2009    right breast  . Appendectomy  1983  . Joint replacement  2006    bilateral partial knee replacement  . Cystectomy  2008    Pt states she had a ?cyst removed ?ovaries  . Replacement total knee  2006 x 2    MEDICATIONS: Current Outpatient Prescriptions on File Prior to Visit  Medication Sig Dispense Refill  . ALPRAZolam (XANAX) 1 MG tablet TAKE ONE TABLET BY MOUTH TWICE DAILY AS NEEDED  180 tablet  1  . amLODipine (NORVASC) 5 MG tablet Take 1 tablet (5 mg total) by mouth daily.  30 tablet  0  . aspirin 81 MG tablet Take 81 mg by mouth daily.        Marland Kitchen  atorvastatin (LIPITOR) 20 MG tablet Take 1 tablet (20 mg total) by mouth daily.  90 tablet  1  . Calcium Carbonate-Vitamin D (CALCIUM-D) 600-400 MG-UNIT TABS Take 1 tablet by mouth daily.       . Cholecalciferol (D-3-5) 5000 UNITS capsule Take 5,000 Units by mouth daily.        Marland Kitchen co-enzyme Q-10 30 MG capsule Take 100 mg by mouth daily.        . diphenhydrAMINE (BENADRYL) 25 MG tablet Take 25 mg by mouth every 6 (six) hours as needed.      Marland Kitchen glucose blood (ACCU-CHEK AVIVA) test strip Use as instructed to check blood sugar 1-2 times daily.  Dx 250.00.  100 each  3  . hydrochlorothiazide  (HYDRODIURIL) 25 MG tablet Take 1 tablet (25 mg total) by mouth daily.  30 tablet  0  . Lancets (ACCU-CHEK MULTICLIX) lancets Use as instructed to check blood sugar two times daily.  Dx 250.00  100 each  2  . losartan (COZAAR) 25 MG tablet Take 1 tablet (25 mg total) by mouth daily.  30 tablet  2  . metFORMIN (GLUCOPHAGE) 500 MG tablet TAKE TWO TABLETS BY MOUTH TWICE DAILY WITH  MEALS  360 tablet  0  . metoprolol (LOPRESSOR) 50 MG tablet TAKE ONE-HALF TABLET BY MOUTH TWICE DAILY  90 tablet  0  . Multiple Vitamin (MULTIVITAMIN) tablet Take 1 tablet by mouth daily.        . Omega-3 Fatty Acids (FISH OIL) 1000 MG CAPS 2 caps by mouth twice daily    0  . pantoprazole (PROTONIX) 40 MG tablet TAKE ONE TABLET BY MOUTH EVERY DAY  90 tablet  0  . terbinafine (LAMISIL AT) 1 % cream Apply topically 2 (two) times daily.  30 g  0  . venlafaxine XR (EFFEXOR-XR) 75 MG 24 hr capsule TAKE TWO CAPSULES BY MOUTH ONCE DAILY  28 capsule  0  . zolpidem (AMBIEN) 10 MG tablet Take 1 tablet (10 mg total) by mouth at bedtime as needed for sleep.  90 tablet  1   No current facility-administered medications on file prior to visit.    ALLERGIES: Allergies  Allergen Reactions  . Prednisone Other (See Comments)    "Insomnia"    FAMILY HISTORY: Family History  Problem Relation Age of Onset  . Cancer Mother     breast and lung  . Arthritis Mother   . Hyperlipidemia Mother   . Hypertension Mother   . Diabetes Mother   . Heart disease Mother   . Cancer Brother     lung, prostate  . Heart disease Brother   . Hypertension Brother   . Hyperlipidemia Brother   . Diabetes Brother   . Sudden death Father   . Diabetes Sister   . Hyperlipidemia Sister   . Hypertension Sister   . Diabetes Maternal Aunt   . Heart attack Maternal Aunt   . Hyperlipidemia Maternal Aunt   . Hypertension Maternal Aunt   . Hypertension Maternal Uncle   . Hyperlipidemia Maternal Uncle   . Heart attack Maternal Uncle   . Diabetes  Maternal Uncle   . Diabetes Paternal Aunt   . Heart attack Paternal Aunt   . Hyperlipidemia Paternal Aunt   . Hypertension Paternal Aunt   . Hypertension Paternal Uncle   . Hyperlipidemia Paternal Uncle   . Heart attack Paternal Uncle   . Diabetes Paternal Uncle     SOCIAL HISTORY: History   Social History  .  Marital Status: Married    Spouse Name: N/A    Number of Children: 2  . Years of Education: N/A   Occupational History  . Not on file.   Social History Main Topics  . Smoking status: Former Games developer  . Smokeless tobacco: Never Used     Comment: Smoked socially.  . Alcohol Use: 3.6 - 4.8 oz/week    6-8 Glasses of wine per week  . Drug Use: No  . Sexual Activity: Not on file   Other Topics Concern  . Not on file   Social History Narrative   Regular exercise:  No   Caffeine use: 1 cup daily          REVIEW OF SYSTEMS: Constitutional: No fevers, chills, or sweats, no generalized fatigue, change in appetite Eyes: No visual changes, double vision, eye pain Ear, nose and throat: No hearing loss, ear pain, nasal congestion, sore throat Cardiovascular: No chest pain, palpitations Respiratory:  No shortness of breath at rest or with exertion, wheezes GastrointestinaI: No nausea, vomiting, diarrhea, abdominal pain, fecal incontinence Genitourinary:  No dysuria, urinary retention or frequency Musculoskeletal:  No neck pain, back pain Integumentary: No rash, pruritus, skin lesions Neurological: as above Psychiatric: No depression, insomnia, anxiety Endocrine: No palpitations, fatigue, diaphoresis, mood swings, change in appetite, change in weight, increased thirst Hematologic/Lymphatic:  No anemia, purpura, petechiae. Allergic/Immunologic: no itchy/runny eyes, nasal congestion, recent allergic reactions, rashes  PHYSICAL EXAM: Filed Vitals:   11/29/12 1517  BP: 140/80  Pulse: 80  Temp: 98.2 F (36.8 C)   General: No acute distress Head:   Normocephalic/atraumatic Neck: supple, no paraspinal tenderness, full range of motion Back: No paraspinal tenderness Heart: regular rate and rhythm Lungs: Clear to auscultation bilaterally. Vascular: No carotid bruits. Neurological Exam: Mental status: alert and oriented to person, place, and time, speech fluent and not dysarthric, language intact. Cranial nerves: CN I: not tested CN II: pupils equal, round and reactive to light, visual fields intact, fundi unremarkable. CN III, IV, VI:  full range of motion, no nystagmus, no ptosis CN V: facial sensation intact CN VII: upper and lower face symmetric CN VIII: hearing intact CN IX, X: gag intact, uvula midline CN XI: sternocleidomastoid and trapezius muscles intact CN XII: tongue midline Bulk & Tone: normal, no fasciculations. Motor: 5/5 throughout Sensation: temperature sensation reduced in feet.  Vibration intact. Deep Tendon Reflexes: 2+ throughout, except reduced in ankles. Finger to nose testing: no dysmetria Gait: normal stride.  Able to walk on toes, heels and in tandem. Romberg with mild sway.  IMPRESSION: 1.  Benign paroxysmal positional vertigo.  The brief attacks precipitated by change in head movements makes this the most likely diagnosis 2.  Cerebrovascular disease  PLAN: Treatment should be tailored to symptomatic management and secondary stroke prevention 1.  Discussed with patient to return to vestibular rehab. 2.  Follow up with ENT. 3.  Continue ASA 81mg  daily, statin, blood pressure control and diabetes control. 4. Carotid doppler to look for evidence of hemodynamically significant asymptomatic ICA stenosis.  45 minutes spent with patient, over 50% spent counseling and coordinating care.  Thank you for allowing me to take part in the care of this patient.  Shon Millet, DO  CC:  Sandford Craze, NP

## 2012-12-02 ENCOUNTER — Encounter (HOSPITAL_COMMUNITY): Payer: Medicare Other

## 2012-12-02 DIAGNOSIS — R42 Dizziness and giddiness: Secondary | ICD-10-CM | POA: Diagnosis not present

## 2012-12-07 ENCOUNTER — Ambulatory Visit (HOSPITAL_COMMUNITY): Payer: Medicare Other | Attending: Neurology

## 2012-12-07 DIAGNOSIS — I10 Essential (primary) hypertension: Secondary | ICD-10-CM | POA: Insufficient documentation

## 2012-12-07 DIAGNOSIS — E785 Hyperlipidemia, unspecified: Secondary | ICD-10-CM | POA: Insufficient documentation

## 2012-12-07 DIAGNOSIS — Z87891 Personal history of nicotine dependence: Secondary | ICD-10-CM | POA: Diagnosis not present

## 2012-12-07 DIAGNOSIS — I679 Cerebrovascular disease, unspecified: Secondary | ICD-10-CM

## 2012-12-07 DIAGNOSIS — I658 Occlusion and stenosis of other precerebral arteries: Secondary | ICD-10-CM | POA: Insufficient documentation

## 2012-12-07 DIAGNOSIS — R42 Dizziness and giddiness: Secondary | ICD-10-CM | POA: Diagnosis not present

## 2012-12-07 DIAGNOSIS — E119 Type 2 diabetes mellitus without complications: Secondary | ICD-10-CM | POA: Insufficient documentation

## 2012-12-07 DIAGNOSIS — I6529 Occlusion and stenosis of unspecified carotid artery: Secondary | ICD-10-CM | POA: Insufficient documentation

## 2012-12-10 ENCOUNTER — Telehealth: Payer: Self-pay | Admitting: Neurology

## 2012-12-10 NOTE — Telephone Encounter (Signed)
Left a message for Kimberly Lin to return my call.   

## 2012-12-10 NOTE — Telephone Encounter (Signed)
Message copied by Benay Spice on Fri Dec 10, 2012  3:17 PM ------      Message from: JAFFE, ADAM R      Created: Fri Dec 10, 2012  6:05 AM       Carotid dopplers show mild plaque buildup but nothing significant.      ----- Message -----         From: Antonieta Iba, MD         Sent: 12/09/2012   5:14 PM           To: Cira Servant, DO                   ------

## 2012-12-13 ENCOUNTER — Other Ambulatory Visit: Payer: Self-pay | Admitting: Family

## 2012-12-13 ENCOUNTER — Ambulatory Visit (INDEPENDENT_AMBULATORY_CARE_PROVIDER_SITE_OTHER): Payer: Medicare Other | Admitting: Family

## 2012-12-13 ENCOUNTER — Encounter: Payer: Self-pay | Admitting: Family

## 2012-12-13 VITALS — BP 130/80 | HR 79 | Resp 16 | Ht 63.5 in | Wt 182.0 lb

## 2012-12-13 DIAGNOSIS — R0602 Shortness of breath: Secondary | ICD-10-CM

## 2012-12-13 DIAGNOSIS — G4733 Obstructive sleep apnea (adult) (pediatric): Secondary | ICD-10-CM | POA: Diagnosis not present

## 2012-12-13 DIAGNOSIS — IMO0001 Reserved for inherently not codable concepts without codable children: Secondary | ICD-10-CM

## 2012-12-13 DIAGNOSIS — R0600 Dyspnea, unspecified: Secondary | ICD-10-CM

## 2012-12-13 DIAGNOSIS — E785 Hyperlipidemia, unspecified: Secondary | ICD-10-CM | POA: Diagnosis not present

## 2012-12-13 DIAGNOSIS — I10 Essential (primary) hypertension: Secondary | ICD-10-CM | POA: Diagnosis not present

## 2012-12-13 DIAGNOSIS — R0609 Other forms of dyspnea: Secondary | ICD-10-CM

## 2012-12-13 DIAGNOSIS — M797 Fibromyalgia: Secondary | ICD-10-CM

## 2012-12-13 DIAGNOSIS — E119 Type 2 diabetes mellitus without complications: Secondary | ICD-10-CM

## 2012-12-13 LAB — LIPID PANEL
Cholesterol: 156 mg/dL (ref 0–200)
HDL: 46 mg/dL (ref >39)
Total CHOL/HDL Ratio: 3.4 Ratio
Triglycerides: 106 mg/dL (ref <150)

## 2012-12-13 LAB — BASIC METABOLIC PANEL
Calcium: 9.6 mg/dL (ref 8.4–10.5)
Creat: 0.77 mg/dL (ref 0.50–1.10)
Glucose, Bld: 125 mg/dL — ABNORMAL HIGH (ref 70–99)
Sodium: 138 mEq/L (ref 135–145)

## 2012-12-13 MED ORDER — DULOXETINE HCL 30 MG PO CPEP: ORAL_CAPSULE | ORAL | Status: DC

## 2012-12-13 NOTE — Telephone Encounter (Signed)
Spoke with the patient. Information given as per Dr. Everlena Cooper below. No additional questions or concerns at this time.

## 2012-12-13 NOTE — Progress Notes (Signed)
Subjective:    Patient ID: Kimberly Lin, female    DOB: May 09, 1945, 67 y.o.   MRN: [id number]  HPI Kimberly Lin is a 67 year old female who presents today for follow up.  1) HTN- Patient still reporting high readings at home, 190/90, denies chest pain.   2) Shortness of breath- patient reports SOB when walking and during house chores, fatigue, patient has mild sleep apnea and does not want to to consider CPAP.  3) Leg cramps- several weeks ago, occurs at night, patient feels legs get shaky now better. Requesting something for her fibromyalgia pain.  4) DM2- Patient checks BS at home reporting sugars running in 160's in the AM   Review of Systems  Constitutional: Positive for activity change and fatigue.   Past Medical History  Diagnosis Date  . Arthritis     osteoarthritis  . Fibromyalgia   . History of chicken pox   . Depression   . Diabetes mellitus   . GERD (gastroesophageal reflux disease)   . Hyperlipidemia   . Hypertension   . Migraine   . Personal history of colonic polyps   . History of septic shock 12/2009  . Spinal stenosis   . OSA (obstructive sleep apnea) 03/12/2011    History   Social History  . Marital Status: Married    Spouse Name: N/A    Number of Children: 2  . Years of Education: N/A   Occupational History  . Not on file.   Social History Main Topics  . Smoking status: Former Games developer  . Smokeless tobacco: Never Used     Comment: Smoked socially.  . Alcohol Use: 3.6 - 4.8 oz/week    6-8 Glasses of wine per week  . Drug Use: No  . Sexual Activity: Not on file   Other Topics Concern  . Not on file   Social History Narrative   Regular exercise:  No   Caffeine use: 1 cup daily          Past Surgical History  Procedure Laterality Date  . Breast biopsy  2007, 2009    right breast  . Appendectomy  1983  . Joint replacement  2006    bilateral partial knee replacement  . Cystectomy  2008    Pt states she had a ?cyst removed ?ovaries   . Replacement total knee  2006 x 2    Family History  Problem Relation Age of Onset  . Cancer Mother     breast and lung  . Arthritis Mother   . Hyperlipidemia Mother   . Hypertension Mother   . Diabetes Mother   . Heart disease Mother   . Cancer Brother     lung, prostate  . Heart disease Brother   . Hypertension Brother   . Hyperlipidemia Brother   . Diabetes Brother   . Sudden death Father   . Diabetes Sister   . Hyperlipidemia Sister   . Hypertension Sister   . Diabetes Maternal Aunt   . Heart attack Maternal Aunt   . Hyperlipidemia Maternal Aunt   . Hypertension Maternal Aunt   . Hypertension Maternal Uncle   . Hyperlipidemia Maternal Uncle   . Heart attack Maternal Uncle   . Diabetes Maternal Uncle   . Diabetes Paternal Aunt   . Heart attack Paternal Aunt   . Hyperlipidemia Paternal Aunt   . Hypertension Paternal Aunt   . Hypertension Paternal Uncle   . Hyperlipidemia Paternal Uncle   . Heart attack  Paternal Uncle   . Diabetes Paternal Uncle     Allergies  Allergen Reactions  . Prednisone Other (See Comments)    "Insomnia"    Current Outpatient Prescriptions on File Prior to Visit  Medication Sig Dispense Refill  . ALPRAZolam (XANAX) 1 MG tablet TAKE ONE TABLET BY MOUTH TWICE DAILY AS NEEDED  180 tablet  1  . amLODipine (NORVASC) 5 MG tablet Take 1 tablet (5 mg total) by mouth daily.  30 tablet  0  . aspirin 81 MG tablet Take 81 mg by mouth daily.        Marland Kitchen atorvastatin (LIPITOR) 20 MG tablet Take 1 tablet (20 mg total) by mouth daily.  90 tablet  1  . Calcium Carbonate-Vitamin D (CALCIUM-D) 600-400 MG-UNIT TABS Take 1 tablet by mouth daily.       . Cholecalciferol (D-3-5) 5000 UNITS capsule Take 5,000 Units by mouth daily.        Marland Kitchen co-enzyme Q-10 30 MG capsule Take 100 mg by mouth daily.        . diphenhydrAMINE (BENADRYL) 25 MG tablet Take 25 mg by mouth every 6 (six) hours as needed.      Marland Kitchen glucose blood (ACCU-CHEK AVIVA) test strip Use as instructed  to check blood sugar 1-2 times daily.  Dx 250.00.  100 each  3  . hydrochlorothiazide (HYDRODIURIL) 25 MG tablet Take 1 tablet (25 mg total) by mouth daily.  30 tablet  0  . Lancets (ACCU-CHEK MULTICLIX) lancets Use as instructed to check blood sugar two times daily.  Dx 250.00  100 each  2  . losartan (COZAAR) 25 MG tablet Take 1 tablet (25 mg total) by mouth daily.  30 tablet  2  . metFORMIN (GLUCOPHAGE) 500 MG tablet TAKE TWO TABLETS BY MOUTH TWICE DAILY WITH  MEALS  360 tablet  0  . metoprolol (LOPRESSOR) 50 MG tablet TAKE ONE-HALF TABLET BY MOUTH TWICE DAILY  90 tablet  0  . Multiple Vitamin (MULTIVITAMIN) tablet Take 1 tablet by mouth daily.        . Omega-3 Fatty Acids (FISH OIL) 1000 MG CAPS 2 caps by mouth twice daily    0  . terbinafine (LAMISIL AT) 1 % cream Apply topically 2 (two) times daily.  30 g  0  . zolpidem (AMBIEN) 10 MG tablet Take 1 tablet (10 mg total) by mouth at bedtime as needed for sleep.  90 tablet  1   No current facility-administered medications on file prior to visit.    BP 130/80  Pulse 79  Resp 16  Ht 5' 3.5" (1.613 m)  Wt 182 lb (82.555 kg)  BMI 31.73 kg/m2  SpO2 97%       Objective:   Physical Exam  Constitutional: She is oriented to person, place, and time. She appears well-developed and well-nourished. No distress.  HENT:  Head: Normocephalic and atraumatic.  Cardiovascular: Normal rate and regular rhythm.   No murmur heard. Pulmonary/Chest: Effort normal and breath sounds normal. No respiratory distress. She has no wheezes. She has no rales. She exhibits no tenderness.  Neurological: She is alert and oriented to person, place, and time.  Psychiatric: She has a normal mood and affect. Her behavior is normal. Judgment and thought content normal.          Assessment & Plan:

## 2012-12-13 NOTE — Patient Instructions (Addendum)
Please complete lab work. Start cymbalta. You will be contacted about your referral for the echocardiogram. Follow up in 1 month.

## 2012-12-14 ENCOUNTER — Encounter: Payer: Self-pay | Admitting: Family

## 2012-12-14 ENCOUNTER — Other Ambulatory Visit: Payer: Self-pay | Admitting: Family

## 2012-12-14 MED ORDER — VENLAFAXINE HCL ER 75 MG PO CP24: ORAL_CAPSULE | ORAL | Status: DC

## 2012-12-15 ENCOUNTER — Ambulatory Visit (HOSPITAL_BASED_OUTPATIENT_CLINIC_OR_DEPARTMENT_OTHER)
Admission: RE | Admit: 2012-12-15 | Discharge: 2012-12-15 | Disposition: A | Discharge status: Home / Self Care | Payer: Medicare Other | Source: Ambulatory Visit | Attending: Family | Admitting: Family

## 2012-12-15 DIAGNOSIS — R0609 Other forms of dyspnea: Secondary | ICD-10-CM | POA: Insufficient documentation

## 2012-12-15 DIAGNOSIS — R42 Dizziness and giddiness: Secondary | ICD-10-CM | POA: Insufficient documentation

## 2012-12-15 DIAGNOSIS — F329 Major depressive disorder, single episode, unspecified: Secondary | ICD-10-CM | POA: Insufficient documentation

## 2012-12-15 DIAGNOSIS — K219 Gastro-esophageal reflux disease without esophagitis: Secondary | ICD-10-CM | POA: Insufficient documentation

## 2012-12-15 DIAGNOSIS — E119 Type 2 diabetes mellitus without complications: Secondary | ICD-10-CM | POA: Insufficient documentation

## 2012-12-15 DIAGNOSIS — I10 Essential (primary) hypertension: Secondary | ICD-10-CM | POA: Diagnosis not present

## 2012-12-15 DIAGNOSIS — Z87891 Personal history of nicotine dependence: Secondary | ICD-10-CM | POA: Insufficient documentation

## 2012-12-15 DIAGNOSIS — F3289 Other specified depressive episodes: Secondary | ICD-10-CM | POA: Insufficient documentation

## 2012-12-15 DIAGNOSIS — R0989 Other specified symptoms and signs involving the circulatory and respiratory systems: Secondary | ICD-10-CM | POA: Insufficient documentation

## 2012-12-15 DIAGNOSIS — I4949 Other premature depolarization: Secondary | ICD-10-CM | POA: Insufficient documentation

## 2012-12-15 DIAGNOSIS — R0602 Shortness of breath: Secondary | ICD-10-CM

## 2012-12-15 DIAGNOSIS — E785 Hyperlipidemia, unspecified: Secondary | ICD-10-CM | POA: Insufficient documentation

## 2012-12-15 DIAGNOSIS — I517 Cardiomegaly: Secondary | ICD-10-CM

## 2012-12-15 NOTE — Progress Notes (Signed)
*  PRELIMINARY RESULTS* Echocardiogram 2D Echocardiogram has been performed.  Kimberly Lin 12/15/2012, 11:54 AM

## 2012-12-16 NOTE — Assessment & Plan Note (Addendum)
BNP normal, 2-D echo notes normal LVEF- mild diastolic dysfunction.  Suspect multifactorial- overweight, deconditioning, OSA.  Need to focus on weight loss.

## 2012-12-16 NOTE — Assessment & Plan Note (Signed)
Trial of cymbalta for pain.

## 2012-12-16 NOTE — Assessment & Plan Note (Signed)
Lab Results  Component Value Date   HGBA1C 6.4* 10/15/2012   Stable.   Managed on metformin. Continue same.

## 2012-12-16 NOTE — Assessment & Plan Note (Addendum)
BP Readings from Last 3 Encounters:  12/13/12 130/80  11/29/12 140/80  11/22/12 150/90   BP here looks good. Continue current meds. Monitor

## 2012-12-20 ENCOUNTER — Other Ambulatory Visit: Payer: Self-pay | Admitting: Family

## 2012-12-29 ENCOUNTER — Other Ambulatory Visit: Payer: Self-pay | Admitting: *Deleted

## 2012-12-29 MED ORDER — ZOLPIDEM TARTRATE 10 MG PO TABS: 10.0000 mg | ORAL_TABLET | Freq: Every evening | ORAL | Status: DC | PRN

## 2012-12-29 MED ORDER — ALPRAZOLAM 1 MG PO TABS: ORAL_TABLET | ORAL | Status: DC

## 2012-12-29 NOTE — Telephone Encounter (Signed)
Will forward to Dr. Abner Greenspan to see if she is comfortable providing refills due to the 90 day request.

## 2012-12-29 NOTE — Telephone Encounter (Signed)
Received call from pt requesting refills of alprazolam and zolpidem to [facility] instead of mail order. States last rx from mail order was 09/29/12 for both of these medications.  Received call from pt requesting refill of alprazolam and zolpidem. Verified with [facility] at [facility] that both Rxs were filled for a 3 month supply on 09/29/12. Verified with [facility] that rxs were last filled with them in May.  Please advise re: requests.

## 2012-12-30 ENCOUNTER — Other Ambulatory Visit: Payer: Self-pay | Admitting: Family

## 2012-12-31 ENCOUNTER — Telehealth: Payer: Self-pay | Admitting: Family

## 2012-12-31 MED ORDER — HYDROCHLOROTHIAZIDE 25 MG PO TABS: 25.0000 mg | ORAL_TABLET | Freq: Every day | ORAL | Status: DC

## 2012-12-31 NOTE — Telephone Encounter (Signed)
Refill sent to pharmacy. Also had message from pt stating pharmacy still had not received alprazolam and zolpidem rxs.  Called rxs to [facility] per 12/29/12 refill note. Notified pt.

## 2012-12-31 NOTE — Telephone Encounter (Signed)
Refill hydrochlorothiazide 25mg  tab qty 30 take 1 tablet by mouth once daily  Last fill 11-22-12

## 2013-01-05 ENCOUNTER — Other Ambulatory Visit: Payer: Self-pay | Admitting: Family

## 2013-01-05 NOTE — Telephone Encounter (Signed)
Rx request to pharmacy/[facility]  

## 2013-01-10 ENCOUNTER — Ambulatory Visit (INDEPENDENT_AMBULATORY_CARE_PROVIDER_SITE_OTHER): Payer: Medicare Other | Admitting: Family

## 2013-01-10 ENCOUNTER — Ambulatory Visit (HOSPITAL_BASED_OUTPATIENT_CLINIC_OR_DEPARTMENT_OTHER)
Admission: RE | Admit: 2013-01-10 | Discharge: 2013-01-10 | Disposition: A | Discharge status: Home / Self Care | Payer: Medicare Other | Source: Ambulatory Visit | Attending: Family | Admitting: Family

## 2013-01-10 ENCOUNTER — Encounter: Payer: Self-pay | Admitting: Family

## 2013-01-10 ENCOUNTER — Encounter (HOSPITAL_BASED_OUTPATIENT_CLINIC_OR_DEPARTMENT_OTHER): Payer: Self-pay

## 2013-01-10 ENCOUNTER — Other Ambulatory Visit: Payer: Self-pay | Admitting: Family

## 2013-01-10 VITALS — BP 128/86 | HR 84 | Temp 98.6°F | Resp 16 | Ht 63.5 in | Wt 185.1 lb

## 2013-01-10 DIAGNOSIS — R109 Unspecified abdominal pain: Secondary | ICD-10-CM | POA: Diagnosis not present

## 2013-01-10 DIAGNOSIS — R10819 Abdominal tenderness, unspecified site: Secondary | ICD-10-CM | POA: Diagnosis not present

## 2013-01-10 DIAGNOSIS — M25559 Pain in unspecified hip: Secondary | ICD-10-CM | POA: Diagnosis not present

## 2013-01-10 DIAGNOSIS — R935 Abnormal findings on diagnostic imaging of other abdominal regions, including retroperitoneum: Secondary | ICD-10-CM | POA: Diagnosis not present

## 2013-01-10 DIAGNOSIS — I2510 Atherosclerotic heart disease of native coronary artery without angina pectoris: Secondary | ICD-10-CM | POA: Diagnosis not present

## 2013-01-10 DIAGNOSIS — R197 Diarrhea, unspecified: Secondary | ICD-10-CM

## 2013-01-10 LAB — CBC WITH DIFFERENTIAL/PLATELET
Basophils Absolute: 0 10*3/uL (ref 0.0–0.1)
Basophils Relative: 1 % (ref 0–1)
Eosinophils Absolute: 0.4 10*3/uL (ref 0.0–0.7)
Eosinophils Relative: 6 % — ABNORMAL HIGH (ref 0–5)
HCT: 40.4 % (ref 36.0–46.0)
Hemoglobin: 13.7 g/dL (ref 12.0–15.0)
MCH: 31.4 pg (ref 26.0–34.0)
MCHC: 33.9 g/dL (ref 30.0–36.0)
MCV: 92.7 fL (ref 78.0–100.0)
Monocytes Absolute: 0.9 10*3/uL (ref 0.1–1.0)
Monocytes Relative: 13 % — ABNORMAL HIGH (ref 3–12)
Neutro Abs: 3.9 10*3/uL (ref 1.7–7.7)
RDW: 13.5 % (ref 11.5–15.5)
WBC: 6.6 10*3/uL (ref 4.0–10.5)

## 2013-01-10 LAB — BASIC METABOLIC PANEL
CO2: 26 mEq/L (ref 19–32)
Calcium: 9.2 mg/dL (ref 8.4–10.5)
Glucose, Bld: 155 mg/dL — ABNORMAL HIGH (ref 70–99)
Sodium: 137 mEq/L (ref 135–145)

## 2013-01-10 MED ORDER — IOHEXOL 300 MG/ML  SOLN
100.0000 mL | Freq: Once | INTRAMUSCULAR | Status: AC | PRN
  Administered 2013-01-10: 100 mL via INTRAVENOUS

## 2013-01-10 NOTE — Patient Instructions (Addendum)
Please complete and return stool studies. Complete CT scan on the first floor.   Follow up in 1 week.

## 2013-01-10 NOTE — Assessment & Plan Note (Signed)
Given duration and abdominal tenderness, plan to obtain stool studies, CT abd/pelvis, bmet, CBC diff.

## 2013-01-10 NOTE — Progress Notes (Signed)
Subjective:    Patient ID: Kimberly Lin, female    DOB: 1945-06-30, 66 y.o.   MRN: [id number]  HPI  Kimberly Lin is a 67 yr old female who presents today with chief complaint of diarrhea. Has been occuring x 8 days. Reports that she has been using imodium with only slight improvement.  Reports that she has been soiling her clothes 4-5 times a day. Reports that she did vomit on Saturday.  Reports that she has been tolerating some PO's. Reports that she is drinking a lot of water.  No documented fever. Reports she had one episode of rigors last week. Reports that she was having left sided abdominal pain.  Pain has resolved.  She has not taken an antibiotic in several years.       Review of Systems    see HPI  Past Medical History  Diagnosis Date  . Arthritis     osteoarthritis  . Fibromyalgia   . History of chicken pox   . Depression   . Diabetes mellitus   . GERD (gastroesophageal reflux disease)   . Hyperlipidemia   . Hypertension   . Migraine   . Personal history of colonic polyps   . History of septic shock 12/2009  . Spinal stenosis   . OSA (obstructive sleep apnea) 03/12/2011    History   Social History  . Marital Status: Married    Spouse Name: N/A    Number of Children: 2  . Years of Education: N/A   Occupational History  . Not on file.   Social History Main Topics  . Smoking status: Former Games developer  . Smokeless tobacco: Never Used     Comment: Smoked socially.  . Alcohol Use: 3.6 - 4.8 oz/week    6-8 Glasses of wine per week  . Drug Use: No  . Sexual Activity: Not on file   Other Topics Concern  . Not on file   Social History Narrative   Regular exercise:  No   Caffeine use: 1 cup daily          Past Surgical History  Procedure Laterality Date  . Breast biopsy  2007, 2009    right breast  . Appendectomy  1983  . Joint replacement  2006    bilateral partial knee replacement  . Cystectomy  2008    Pt states she had a ?cyst removed ?ovaries    . Replacement total knee  2006 x 2    Family History  Problem Relation Age of Onset  . Cancer Mother     breast and lung  . Arthritis Mother   . Hyperlipidemia Mother   . Hypertension Mother   . Diabetes Mother   . Heart disease Mother   . Cancer Brother     lung, prostate  . Heart disease Brother   . Hypertension Brother   . Hyperlipidemia Brother   . Diabetes Brother   . Sudden death Father   . Diabetes Sister   . Hyperlipidemia Sister   . Hypertension Sister   . Diabetes Maternal Aunt   . Heart attack Maternal Aunt   . Hyperlipidemia Maternal Aunt   . Hypertension Maternal Aunt   . Hypertension Maternal Uncle   . Hyperlipidemia Maternal Uncle   . Heart attack Maternal Uncle   . Diabetes Maternal Uncle   . Diabetes Paternal Aunt   . Heart attack Paternal Aunt   . Hyperlipidemia Paternal Aunt   . Hypertension Paternal Aunt   .  Hypertension Paternal Uncle   . Hyperlipidemia Paternal Uncle   . Heart attack Paternal Uncle   . Diabetes Paternal Uncle     Allergies  Allergen Reactions  . Prednisone Other (See Comments)    "Insomnia"    Current Outpatient Prescriptions on File Prior to Visit  Medication Sig Dispense Refill  . ALPRAZolam (XANAX) 1 MG tablet TAKE ONE TABLET BY MOUTH TWICE DAILY AS NEEDED  180 tablet  1  . amLODipine (NORVASC) 5 MG tablet TAKE ONE TABLET BY MOUTH ONCE DAILY  30 tablet  2  . aspirin 81 MG tablet Take 81 mg by mouth daily.        Marland Kitchen atorvastatin (LIPITOR) 20 MG tablet Take 1 tablet (20 mg total) by mouth daily.  90 tablet  1  . Calcium Carbonate-Vitamin D (CALCIUM-D) 600-400 MG-UNIT TABS Take 1 tablet by mouth daily.       . Cholecalciferol (D-3-5) 5000 UNITS capsule Take 5,000 Units by mouth daily.        Marland Kitchen co-enzyme Q-10 30 MG capsule Take 100 mg by mouth daily.        . diphenhydrAMINE (BENADRYL) 25 MG tablet Take 25 mg by mouth every 6 (six) hours as needed.      . DULoxetine (CYMBALTA) 30 MG capsule Take 2 capsules (60 mg total) by  mouth daily.  60 capsule  3  . glucose blood (ACCU-CHEK AVIVA) test strip Use as instructed to check blood sugar 1-2 times daily.  Dx 250.00.  100 each  3  . hydrochlorothiazide (HYDRODIURIL) 25 MG tablet Take 1 tablet (25 mg total) by mouth daily.  30 tablet  3  . Lancets (ACCU-CHEK MULTICLIX) lancets Use as instructed to check blood sugar two times daily.  Dx 250.00  100 each  2  . losartan (COZAAR) 25 MG tablet Take 1 tablet (25 mg total) by mouth daily.  30 tablet  2  . metFORMIN (GLUCOPHAGE) 500 MG tablet TAKE TWO TABLETS BY MOUTH TWICE DAILY WITH  MEALS  360 tablet  0  . metoprolol (LOPRESSOR) 50 MG tablet TAKE ONE-HALF TABLET BY MOUTH TWICE DAILY  90 tablet  0  . Multiple Vitamin (MULTIVITAMIN) tablet Take 1 tablet by mouth daily.        . Omega-3 Fatty Acids (FISH OIL) 1000 MG CAPS 2 caps by mouth twice daily    0  . pantoprazole (PROTONIX) 40 MG tablet TAKE ONE TABLET BY MOUTH EVERY DAY  30 tablet  3  . terbinafine (LAMISIL AT) 1 % cream Apply topically 2 (two) times daily.  30 g  0  . venlafaxine XR (EFFEXOR-XR) 75 MG 24 hr capsule TAKE TWO CAPSULES BY MOUTH ONCE DAILY  60 capsule  3  . zolpidem (AMBIEN) 10 MG tablet Take 1 tablet (10 mg total) by mouth at bedtime as needed for sleep.  90 tablet  1   No current facility-administered medications on file prior to visit.    BP 128/86  Pulse 84  Temp(Src) 98.6 F (37 C) (Oral)  Resp 16  Ht 5' 3.5" (1.613 m)  Wt 185 lb 1.9 oz (83.97 kg)  BMI 32.27 kg/m2  SpO2 97%    Objective:   Physical Exam  Constitutional: She is oriented to person, place, and time. She appears well-developed and well-nourished. No distress.  HENT:  Head: Normocephalic and atraumatic.  Eyes: No scleral icterus.  Cardiovascular: Normal rate and regular rhythm.   No murmur heard. Pulmonary/Chest: Effort normal and breath sounds normal.  No respiratory distress. She has no wheezes. She has no rales. She exhibits no tenderness.  Abdominal: Soft. Bowel sounds  are normal. There is no rigidity and no guarding.  Diffuse abdominal tenderness  Musculoskeletal: She exhibits no edema.  Neurological: She is alert and oriented to person, place, and time.  Psychiatric: She has a normal mood and affect. Her behavior is normal. Judgment and thought content normal.          Assessment & Plan:

## 2013-01-11 ENCOUNTER — Ambulatory Visit: Payer: Medicare Other | Admitting: Family

## 2013-01-11 ENCOUNTER — Telehealth: Payer: Self-pay | Admitting: Family

## 2013-01-11 DIAGNOSIS — R935 Abnormal findings on diagnostic imaging of other abdominal regions, including retroperitoneum: Secondary | ICD-10-CM

## 2013-01-11 LAB — HEPATIC FUNCTION PANEL
ALT: 18 U/L (ref 0–35)
Albumin: 3.9 g/dL (ref 3.5–5.2)
Alkaline Phosphatase: 59 U/L (ref 39–117)
Total Protein: 6.7 g/dL (ref 6.0–8.3)

## 2013-01-11 LAB — OVA AND PARASITE SCREEN

## 2013-01-11 MED ORDER — POTASSIUM CHLORIDE CRYS ER 20 MEQ PO TBCR: EXTENDED_RELEASE_TABLET | ORAL | Status: DC

## 2013-01-11 NOTE — Telephone Encounter (Signed)
Notified pt and she voices understanding. 

## 2013-01-11 NOTE — Telephone Encounter (Addendum)
Please ask lab to add on LFT (dx is dilated common bile duct).  Potassium is a little low.  Likely due to diarrhea.  Rx provided for Kdur and sent to her pharmacy.  Let pt know that CT is negative for diverticulitis.  Slight dilation of common bile duct- sometimes can be due to gallstone.  I will have lab add on liver function testing to check for signs of blockage, but I doubt that this is related to her symptoms.  Also,  Note is made of some calcifications on her coronary arteries.  Doesn't mean that she has blockage necessarily.  Important that she continue statin (lipitor) and aspirin and that she eat a low fat/low cholesterol diet.  Stool studies pending.

## 2013-01-11 NOTE — Telephone Encounter (Signed)
Unable to find code for unspecified dilatation of common bile duct. Per verbal from Provider, ok to use abnormal CT of abdomen (793.6).  Test has been added per [facility]. Left message with pt's husband to have pt return my call.

## 2013-01-13 ENCOUNTER — Telehealth: Payer: Self-pay | Admitting: Family

## 2013-01-13 DIAGNOSIS — R109 Unspecified abdominal pain: Secondary | ICD-10-CM

## 2013-01-13 LAB — STOOL CULTURE

## 2013-01-13 NOTE — Telephone Encounter (Signed)
Patient left message on nurse voicemail stating that she is not feeling any better since her visit on 01/10/13. She states that she still has diarrhea and her stomach still hurts. She would like to know what [name] recommends she do?

## 2013-01-14 ENCOUNTER — Encounter: Payer: Self-pay | Admitting: Gastroenterology

## 2013-01-14 ENCOUNTER — Encounter: Payer: Self-pay | Admitting: *Deleted

## 2013-01-14 MED ORDER — DIPHENOXYLATE-ATROPINE 2.5-0.025 MG PO TABS: 1.0000 | ORAL_TABLET | Freq: Four times a day (QID) | ORAL | Status: DC | PRN

## 2013-01-14 NOTE — Telephone Encounter (Signed)
Spoke with pt. She states she continues to have diarrhea and abdominal pain. States the immodium is only slowing it down, not stopping it. Please advise.

## 2013-01-14 NOTE — Telephone Encounter (Signed)
Spoke with pt, reports continued loose stools- CT abd/pelvis was negative except ? Mild enlargement common bile duct.  However LFT normal.  She is tolerating PO's. Stool studies negative. Advised pt that I would send rx to her pharmacy for lomotil and refer to GI.  She verbalizes understanding. Advised pt to also continue to push fluids and to go to the ER if symptoms worsen or if she developess weakness.

## 2013-01-18 ENCOUNTER — Encounter: Payer: Self-pay | Admitting: Gastroenterology

## 2013-01-18 ENCOUNTER — Ambulatory Visit (INDEPENDENT_AMBULATORY_CARE_PROVIDER_SITE_OTHER): Payer: Medicare Other | Admitting: Gastroenterology

## 2013-01-18 ENCOUNTER — Other Ambulatory Visit (INDEPENDENT_AMBULATORY_CARE_PROVIDER_SITE_OTHER): Payer: Medicare Other

## 2013-01-18 VITALS — BP 110/72 | HR 82 | Ht 63.25 in | Wt 183.5 lb

## 2013-01-18 DIAGNOSIS — R197 Diarrhea, unspecified: Secondary | ICD-10-CM | POA: Diagnosis not present

## 2013-01-18 DIAGNOSIS — Z860100 Personal history of colon polyps, unspecified: Secondary | ICD-10-CM

## 2013-01-18 DIAGNOSIS — R1032 Left lower quadrant pain: Secondary | ICD-10-CM | POA: Diagnosis not present

## 2013-01-18 LAB — C-REACTIVE PROTEIN: CRP: 3 mg/dL (ref 0.5–20.0)

## 2013-01-18 LAB — CBC WITH DIFFERENTIAL/PLATELET
Basophils Absolute: 0.1 10*3/uL (ref 0.0–0.1)
Eosinophils Absolute: 0.5 10*3/uL (ref 0.0–0.7)
Eosinophils Relative: 5.3 % — ABNORMAL HIGH (ref 0.0–5.0)
HCT: 41.2 % (ref 36.0–46.0)
Lymphocytes Relative: 21.9 % (ref 12.0–46.0)
Lymphs Abs: 1.9 10*3/uL (ref 0.7–4.0)
MCHC: 34 g/dL (ref 30.0–36.0)
Monocytes Relative: 7.1 % (ref 3.0–12.0)
Neutrophils Relative %: 64.9 % (ref 43.0–77.0)
Platelets: 296 10*3/uL (ref 150.0–400.0)
RDW: 13.3 % (ref 11.5–14.6)
WBC: 8.6 10*3/uL (ref 4.5–10.5)

## 2013-01-18 MED ORDER — NA SULFATE-K SULFATE-MG SULF 17.5-3.13-1.6 GM/177ML PO SOLN: ORAL | Status: DC

## 2013-01-18 NOTE — Patient Instructions (Signed)
You have been scheduled for a colonoscopy with propofol. Please follow written instructions given to you at your visit today.  If you use inhalers (even only as needed), please bring them with you on the day of your procedure. Your physician has requested that you go to www.startemmi.com and enter the access code given to you at your visit today. This web site gives a general overview about your procedure. However, you should still follow specific instructions given to you by our office regarding your preparation for the procedure.  CALL BY 5 PM TODAY TO CANCEL PLEASE FOLLOW DIABETIC INSTRUCTIONS ON YOUR PREP SHEET  PREP KIT GIVEN TO YOU TODAY

## 2013-01-18 NOTE — Progress Notes (Signed)
History of Present Illness:  This is a 67 year old Caucasian female with hypertensive cardiovascular disease, fibromyalgia, chronic depression, adult onset diabetes, and a vague past history of colon polyps.  She is referred today by primary care because of a two-week history of watery diarrhea with some crampy lower abdominal pain, rather severe rectal urgency, but no melena or hematochezia.  She's had stool incontinency, but seems to be better on when necessary Lomotil.  Last colonoscopy was in 2009.  There is no history of infectious disease exposure, antibiotics, foreign travel, or sick family members at home.  She's had an excellent workup by primary care that includes negative stool culture, O&P, C. difficile exam.  Causes some crampy lower abdominal pain she had CT scan of the abdomen performed 2 weeks ago that also was normal.  She denies chronic diarrhea or chronic GI complaints.  There are no current upper GI or hepatobiliary symptoms.  She feels bad intact, but has had no fever, chills, skin rashes, or new arthritis onset.  She does have degenerative arthritis and uses over-the-counter anti-inflammatories.  There is no family history of known inflammatory bowel disease.  I have reviewed this patient's present history, medical and surgical past history, allergies and medications.     ROS:   All systems were reviewed and are negative unless otherwise stated in the HPI.    Physical Exam: Blood pressure 110/72, pulse 82 and regular and weight 183 with a BMI of 32.23. General well developed well nourished patient in no acute distress, appearing their stated age Eyes PERRLA, no icterus, fundoscopic exam per opthamologist Skin no lesions noted Neck supple, no adenopathy, no thyroid enlargement, no tenderness Chest clear to percussion and auscultation Heart no significant murmurs, gallops or rubs noted Abdomen no hepatosplenomegaly masses or tenderness, BS normal.  Rectal inspection normal no  fissures, or fistulae noted.  No masses or tenderness on digital exam. Stool guaiac negative.  Stool in the rectal vault today is formed and is normal color and guaiac-negative. Extremities no acute joint lesions, edema, phlebitis or evidence of cellulitis. Neurologic patient oriented x 3, cranial nerves intact, no focal neurologic deficits noted. Psychological mental status normal and normal affect.  Assessment and plan: 2 weeks of rather severe diarrhea with some incontinency, rule out underlying inflammatory bowel disease since her workup otherwise been entirely negative.  She could have post-viral gastroenteritis IBS-type diarrhea, but her symptoms seem rather severe in nature for this problem.Peggye Form gone ahead and set her up for colonoscopy exam ASAP, and we will proceed accordingly.  If her colonoscopy is negative, I will probably give her an empiric two-week course of metronidazole with low fiber diet avoidance of major lactose products.  I have ordered CBC repeat, sedimentation rate, CRP, and celiac profile.

## 2013-01-19 ENCOUNTER — Encounter: Payer: Self-pay | Admitting: Gastroenterology

## 2013-01-19 ENCOUNTER — Ambulatory Visit (AMBULATORY_SURGERY_CENTER): Payer: Medicare Other | Admitting: Gastroenterology

## 2013-01-19 ENCOUNTER — Encounter: Payer: Medicare Other | Admitting: Gastroenterology

## 2013-01-19 VITALS — BP 104/67 | HR 62 | Temp 97.7°F | Resp 20 | Ht 63.25 in | Wt 183.0 lb

## 2013-01-19 DIAGNOSIS — E119 Type 2 diabetes mellitus without complications: Secondary | ICD-10-CM | POA: Diagnosis not present

## 2013-01-19 DIAGNOSIS — G4733 Obstructive sleep apnea (adult) (pediatric): Secondary | ICD-10-CM | POA: Diagnosis not present

## 2013-01-19 DIAGNOSIS — Z1211 Encounter for screening for malignant neoplasm of colon: Secondary | ICD-10-CM | POA: Diagnosis not present

## 2013-01-19 DIAGNOSIS — R197 Diarrhea, unspecified: Secondary | ICD-10-CM | POA: Diagnosis not present

## 2013-01-19 DIAGNOSIS — IMO0001 Reserved for inherently not codable concepts without codable children: Secondary | ICD-10-CM | POA: Diagnosis not present

## 2013-01-19 DIAGNOSIS — F411 Generalized anxiety disorder: Secondary | ICD-10-CM | POA: Diagnosis not present

## 2013-01-19 DIAGNOSIS — K219 Gastro-esophageal reflux disease without esophagitis: Secondary | ICD-10-CM | POA: Diagnosis not present

## 2013-01-19 DIAGNOSIS — R1032 Left lower quadrant pain: Secondary | ICD-10-CM | POA: Diagnosis not present

## 2013-01-19 DIAGNOSIS — I10 Essential (primary) hypertension: Secondary | ICD-10-CM | POA: Diagnosis not present

## 2013-01-19 DIAGNOSIS — K5289 Other specified noninfective gastroenteritis and colitis: Secondary | ICD-10-CM | POA: Diagnosis not present

## 2013-01-19 LAB — CELIAC PANEL 10
Endomysial Screen: NEGATIVE
Gliadin IgA: 4.6 U/mL (ref <20)
Gliadin IgG: 8.4 U/mL (ref <20)
Tissue Transglut Ab: 18.2 U/mL (ref <20)
Tissue Transglutaminase Ab, IgA: 3.7 U/mL (ref <20)

## 2013-01-19 MED ORDER — SODIUM CHLORIDE 0.9 % IV SOLN: 500.0000 mL | INTRAVENOUS | Status: DC

## 2013-01-19 NOTE — Progress Notes (Signed)
Called to room to assist during endoscopic procedure.  Patient ID and intended procedure confirmed with present staff. Received instructions for my participation in the procedure from the performing physician.  

## 2013-01-19 NOTE — Patient Instructions (Signed)
YOU HAD AN ENDOSCOPIC PROCEDURE TODAY AT THE [facility]: Refer to the procedure report that was given to you for any specific questions about what was found during the examination.  If the procedure report does not answer your questions, please call your gastroenterologist to clarify.  If you requested that your care partner not be given the details of your procedure findings, then the procedure report has been included in a sealed envelope for you to review at your convenience later.  YOU SHOULD EXPECT: Some feelings of bloating in the abdomen. Passage of more gas than usual.  Walking can help get rid of the air that was put into your GI tract during the procedure and reduce the bloating. If you had a lower endoscopy (such as a colonoscopy or flexible sigmoidoscopy) you may notice spotting of blood in your stool or on the toilet paper. If you underwent a bowel prep for your procedure, then you may not have a normal bowel movement for a few days.  DIET: Your first meal following the procedure should be a light meal and then it is ok to progress to your normal diet.  A half-sandwich or bowl of soup is an example of a good first meal.  Heavy or fried foods are harder to digest and may make you feel nauseous or bloated.  Likewise meals heavy in dairy and vegetables can cause extra gas to form and this can also increase the bloating.  Drink plenty of fluids but you should avoid alcoholic beverages for 24 hours.  ACTIVITY: Your care partner should take you home directly after the procedure.  You should plan to take it easy, moving slowly for the rest of the day.  You can resume normal activity the day after the procedure however you should NOT DRIVE or use heavy machinery for 24 hours (because of the sedation medicines used during the test).    SYMPTOMS TO REPORT IMMEDIATELY: A gastroenterologist can be reached at any hour.  During normal business hours, 8:30 AM to 5:00 PM Monday through Friday,  call [phone].  After hours and on weekends, please call the GI answering service at [phone] who will take a message and have the physician on call contact you.   Following lower endoscopy (colonoscopy or flexible sigmoidoscopy):  Excessive amounts of blood in the stool  Significant tenderness or worsening of abdominal pains  Swelling of the abdomen that is new, acute  Fever of 100F or higher  FOLLOW UP: If any biopsies were taken you will be contacted by phone or by letter within the next 1-3 weeks.  Call your gastroenterologist if you have not heard about the biopsies in 3 weeks.  Our staff will call the home number listed on your records the next business day following your procedure to check on you and address any questions or concerns that you may have at that time regarding the information given to you following your procedure. This is a courtesy call and so if there is no answer at the home number and we have not heard from you through the emergency physician on call, we will assume that you have returned to your regular daily activities without incident.  SIGNATURES/CONFIDENTIALITY: You and/or your care partner have signed paperwork which will be entered into your electronic medical record.  These signatures attest to the fact that that the information above on your After Visit Summary has been reviewed and is understood.  Full responsibility of the confidentiality of this   discharge information lies with you and/or your care-partner.  Recommendations See procedure report  

## 2013-01-19 NOTE — Progress Notes (Signed)
Patient did not have preoperative order for IV antibiotic SSI prophylaxis. (G8918)  Patient did not experience any of the following events: a burn prior to discharge; a fall within the facility; wrong site/side/patient/procedure/implant event; or a hospital transfer or hospital admission upon discharge from the facility. (G8907)  

## 2013-01-19 NOTE — Op Note (Signed)
[facility] [address] [place], [address]   COLONOSCOPY PROCEDURE REPORT  PATIENT: Kimberly Lin, Kimberly Lin  MR#: [id number] BIRTHDATE: 1945-02-28 , 67  yrs. old GENDER: Female ENDOSCOPIST: Mardella Layman, MD, [facility] REFERRED BY: PROCEDURE DATE:  01/19/2013 PROCEDURE:   Colonoscopy with biopsy First Screening Colonoscopy - Avg.  risk and is 50 yrs.  old or older - No.  Prior Negative Screening - Now for repeat screening. N/A  History of Adenoma - Now for follow-up colonoscopy & has been > or = to 3 yrs.  N/A ASA CLASS:   Class III INDICATIONS:average risk screening, chronic diarrhea, and unexplained diarrhea. MEDICATIONS: Propofol (Diprivan) 220 mg IV  DESCRIPTION OF PROCEDURE:   After the risks benefits and alternatives of the procedure were thoroughly explained, informed consent was obtained.  A digital rectal exam revealed no abnormalities of the rectum.   The LB [id number]  endoscope was introduced through the anus and advanced to the cecum, which was identified by both the appendix and ileocecal valve. No adverse events experienced.   The quality of the prep was excellent, using MoviPrep  The instrument was then slowly withdrawn as the colon was fully examined.      COLON FINDINGS: A normal appearing cecum, ileocecal valve, and appendiceal orifice were identified.  The ascending, hepatic flexure, transverse, splenic flexure, descending, sigmoid colon and rectum appeared unremarkable.  No polyps or cancers were seen. Multiple random biopsies of the area were performed.  Retroflexed views revealed no abnormalities. The time to cecum=3 minutes 46 seconds.  Withdrawal time=7 minutes 54 seconds.  The scope was withdrawn and the procedure completed. COMPLICATIONS: There were no complications.  ENDOSCOPIC IMPRESSION: Normal colon; multiple random biopsies of the area were performed to rule out lymphocytic or microscopic or collagenous colitis.   There is no evidence of inflammatory bowel disease on his colonoscopy. This patient's diarrhea workup to date has been unremarkable.  RECOMMENDATIONS: when necessary Lomotil use until biopsies are available.  She has microscopic colitis we'll of course treat her with Entocort or aminosalicylate therapy.  In the interim, we'll use ASACOL 800 mg twice a day,also low fiber diet as tolerated.   eSigned:  Mardella Layman, MD, [facility] 01/19/2013 1:54 PM   cc:   PATIENT NAME:  [name], [name] MR#: [id number]

## 2013-01-19 NOTE — Progress Notes (Signed)
Report to pacu rn, vss, bbs=clear 

## 2013-01-20 ENCOUNTER — Telehealth: Payer: Self-pay | Admitting: *Deleted

## 2013-01-20 ENCOUNTER — Encounter: Payer: Self-pay | Admitting: Gastroenterology

## 2013-01-20 NOTE — Telephone Encounter (Signed)
  Follow up Call-  Call back number 01/19/2013  Post procedure Call Back phone  # [phone]  Permission to leave phone message Yes     Patient questions:  Do you have a fever, pain , or abdominal swelling? no Pain Score  0 *  Have you tolerated food without any problems? yes  Have you been able to return to your normal activities? yes  Do you have any questions about your discharge instructions: Diet   no Medications  no Follow up visit  no  Do you have questions or concerns about your Care? no  Actions: * If pain score is 4 or above: No action needed, pain <4.

## 2013-01-20 NOTE — Telephone Encounter (Signed)
Informed pt of path report and the need for entocort x 1 month and f/u with Dr Jarold Motto in January. Scheduled pt for an appt. When entocort was entered, allergy alert came up d/t pt's reaction to prednisone. Pt reports the prednisone drives her crazy. Informed pt entocort is not systemic, it only affects the lining of the colon and does not enter the blood stream. Pt wanted me to check on this; am I right, not systemic? Thanks.

## 2013-01-21 NOTE — Telephone Encounter (Signed)
Should be ok

## 2013-01-21 NOTE — Telephone Encounter (Signed)
Informed pt that Dr Jarold Motto stated she should not experience the same problems with Entocort as with prednisone; also informed her to stop the Asacol. Pt stated understanding.

## 2013-01-24 ENCOUNTER — Telehealth: Payer: Self-pay | Admitting: Gastroenterology

## 2013-01-24 MED ORDER — BUDESONIDE 3 MG PO CP24: 9.0000 mg | ORAL_CAPSULE | Freq: Every day | ORAL | Status: DC

## 2013-01-24 NOTE — Telephone Encounter (Signed)
Reordered Entocort

## 2013-01-24 NOTE — Telephone Encounter (Signed)
Patient has questions about her Entocort not being sent in

## 2013-01-24 NOTE — Telephone Encounter (Signed)
Pt called back to report she cannot afford the Entocort; it will cost almost $400. Can we change her to Uceris; if so, pleae provide the dose? Thanks.

## 2013-01-24 NOTE — Addendum Note (Signed)
Addended by: Florene Glen on: 01/24/2013 01:49 PM   Modules accepted: Orders

## 2013-01-24 NOTE — Telephone Encounter (Signed)
Found enough Entocort for pt; she will pick it up.

## 2013-01-24 NOTE — Telephone Encounter (Signed)
Not approved..will have to use generic entocort

## 2013-02-02 ENCOUNTER — Telehealth: Payer: Self-pay | Admitting: Family

## 2013-02-02 MED ORDER — CIPROFLOXACIN HCL 250 MG PO TABS: 250.0000 mg | ORAL_TABLET | Freq: Two times a day (BID) | ORAL | Status: DC

## 2013-02-02 NOTE — Telephone Encounter (Signed)
The diarrhea is much better.  She has no energy.  What can she do to get back a little energy

## 2013-02-02 NOTE — Telephone Encounter (Signed)
Spoke with pt. Reports dysuria intermittently x 2 weeks. Poor energy.  Will send rx for cipro. Pt to contact office for appointment if symptoms are not improved in 2-3 days.

## 2013-02-06 ENCOUNTER — Other Ambulatory Visit: Payer: Self-pay | Admitting: Family

## 2013-02-15 ENCOUNTER — Encounter: Payer: Self-pay | Admitting: *Deleted

## 2013-02-17 ENCOUNTER — Other Ambulatory Visit: Payer: Self-pay | Admitting: Family

## 2013-02-21 ENCOUNTER — Other Ambulatory Visit: Payer: Self-pay | Admitting: Family

## 2013-02-21 DIAGNOSIS — Z1231 Encounter for screening mammogram for malignant neoplasm of breast: Secondary | ICD-10-CM

## 2013-02-22 ENCOUNTER — Ambulatory Visit (INDEPENDENT_AMBULATORY_CARE_PROVIDER_SITE_OTHER): Payer: Medicare Other | Admitting: Gastroenterology

## 2013-02-22 ENCOUNTER — Encounter: Payer: Self-pay | Admitting: Gastroenterology

## 2013-02-22 VITALS — BP 130/88 | HR 76 | Ht 63.25 in | Wt 180.1 lb

## 2013-02-22 DIAGNOSIS — K5289 Other specified noninfective gastroenteritis and colitis: Secondary | ICD-10-CM | POA: Diagnosis not present

## 2013-02-22 DIAGNOSIS — K52831 Collagenous colitis: Secondary | ICD-10-CM

## 2013-02-22 MED ORDER — BUDESONIDE 3 MG PO CP24: ORAL_CAPSULE | ORAL | Status: DC

## 2013-02-22 NOTE — Progress Notes (Signed)
History of Present Illness: This is a 68 year old Caucasian female with fibromyalgia who recently had an extreme episode of watery diarrhea, underwent a thorough GI workup and was found to have collagenous colitis on colon biopsy exams.  She currently is asymptomatic on Entocort 9 mg a day.  She denies abdominal pain, diarrhea, rectal bleeding, anorexia or weight loss.  A lot of her functional complaints have resolved with improvement in her diarrhea.    Current Medications, Allergies, Past Medical History, Past Surgical History, Family History and Social History were reviewed in [employer] record.  ROS: All systems were reviewed and are negative unless otherwise stated in the HPI.         Assessment and plan: Collagenous colitis which hopefully will improve with tapering doses of Entocort.  I've asked her to go to 6 mg a day for month, then 3 mg a day for month, and then we will try to discontinue Entocort.  She has a relapse perhaps a trial of aminosalicylate therapy would be in order.  I have asked her to avoid all nonsteroidal anti-inflammatory drugs she been associated with this condition.  Please copy her doctor Nelly Laurence.

## 2013-02-22 NOTE — Patient Instructions (Signed)
Please take your Budesonide two capsules by mouth once daily for a month, then one capsule by mouth once daily for a month, then stop.  Call office back and ask for Caren Griffins, Dr Buel Ream nurse, to give a update on how you are feeling.

## 2013-02-23 ENCOUNTER — Telehealth: Payer: Self-pay | Admitting: Gastroenterology

## 2013-02-23 ENCOUNTER — Ambulatory Visit (HOSPITAL_BASED_OUTPATIENT_CLINIC_OR_DEPARTMENT_OTHER): Payer: Medicare Other

## 2013-02-23 NOTE — Telephone Encounter (Signed)
Dr Sharlett Iles, pt cannot afford Entocort; >$600 for 90 pills. Can we substitute something else? Thanks.

## 2013-02-23 NOTE — Telephone Encounter (Signed)
Patient states Entocort is too expensive, is there something else she can take?

## 2013-02-24 NOTE — Telephone Encounter (Signed)
Informed pt we have no samples and we do not know when the drug rep will come around. The substitute Dr Sharlett Iles gave is the generic version and there is no other substitute; pt states she just can't afford it. She states she is doing great now, but she doesn't know if her problem will come back. Dr Sharlett Iles, there is no other drug for pt. She will complete what she has and call for further problems if the colitis comes back.

## 2013-02-24 NOTE — Telephone Encounter (Signed)
no

## 2013-02-24 NOTE — Telephone Encounter (Signed)
generic budesenide.Marland KitchenMarland Kitchen

## 2013-02-24 NOTE — Telephone Encounter (Signed)
No other drug per Dr Sharlett Iles. Pt is aware to call back for problems.

## 2013-02-25 ENCOUNTER — Telehealth: Payer: Self-pay | Admitting: Family

## 2013-02-25 MED ORDER — LOSARTAN POTASSIUM 25 MG PO TABS: 25.0000 mg | ORAL_TABLET | Freq: Every day | ORAL | Status: DC

## 2013-02-25 NOTE — Telephone Encounter (Signed)
Refill sent.

## 2013-02-25 NOTE — Telephone Encounter (Signed)
Changed pharmacies to [facility] on [address].  She needs a new rx for Losartin 25mg  sent to [facility]

## 2013-03-02 ENCOUNTER — Ambulatory Visit (HOSPITAL_BASED_OUTPATIENT_CLINIC_OR_DEPARTMENT_OTHER)
Admission: RE | Admit: 2013-03-02 | Discharge: 2013-03-02 | Disposition: A | Discharge status: Home / Self Care | Payer: Medicare Other | Source: Ambulatory Visit | Attending: Family | Admitting: Family

## 2013-03-02 DIAGNOSIS — Z1231 Encounter for screening mammogram for malignant neoplasm of breast: Secondary | ICD-10-CM | POA: Diagnosis not present

## 2013-03-03 ENCOUNTER — Telehealth: Payer: Self-pay | Admitting: Gastroenterology

## 2013-03-03 NOTE — Telephone Encounter (Signed)
Explained to pt we do not have any samples of Entocort. She states she was just checking; reports one episode of diarrhea.

## 2013-03-09 ENCOUNTER — Telehealth: Payer: Self-pay | Admitting: Gastroenterology

## 2013-03-09 NOTE — Telephone Encounter (Signed)
Pt reports her diarrhea is back with some abdominal discomfort and she wonders if we got any samples in. Checked everywhere in the ofc and we have no samples. Checked and we have no drug rep either. Called [facility] and I don't have her [employer] card, but 90 capsules of Entocort is $1500. Pt had reported 120 caps at [facility] was >$300. 60 caps will be $219,, but pharmacist states the insurance company is paying $950. Called pt back and asked if she could pay 1 month and she says she can't. Referred her to [email or web address]. Pt stated understanding.

## 2013-03-14 ENCOUNTER — Telehealth: Payer: Self-pay | Admitting: Gastroenterology

## 2013-03-14 NOTE — Telephone Encounter (Signed)
Pt states she is worse, was up all night. She was unable to complete the form for [facility]. [place] and they have no samples. She suggested [employer] . Looked up a couple with telephone numbers and called pt to try to apply over the phone.

## 2013-03-14 NOTE — Telephone Encounter (Signed)
Pt states she may have to go on Prednisone to get better. Dr Sharlett Iles, please advise on dose. Thanks.

## 2013-03-15 MED ORDER — MESALAMINE 1.2 G PO TBEC: 2.4000 g | DELAYED_RELEASE_TABLET | Freq: Every day | ORAL | Status: DC

## 2013-03-15 NOTE — Addendum Note (Signed)
Addended by: Lance Morin on: 03/15/2013 09:36 AM   Modules accepted: Orders

## 2013-03-15 NOTE — Telephone Encounter (Signed)
lmom for pt that Dr Sharlett Iles would like for her to try Lialda instead of Prednisone. i will leave her some sample up front. She is waiting on the assistance programs to call her back. If the Alvin work and she can't get Entocort, I have more samples put up for her. Pt stated understanding. Pt will ask for Rollene Fare if she needs more samples.

## 2013-03-15 NOTE — Telephone Encounter (Signed)
Give her samples here of aminosalicylates

## 2013-04-04 ENCOUNTER — Telehealth: Payer: Self-pay | Admitting: Family

## 2013-04-04 DIAGNOSIS — N952 Postmenopausal atrophic vaginitis: Secondary | ICD-10-CM

## 2013-04-04 NOTE — Telephone Encounter (Signed)
Requesting to speak to nurse having vaginal itching again

## 2013-04-04 NOTE — Telephone Encounter (Signed)
Please call cell

## 2013-04-04 NOTE — Addendum Note (Signed)
Addended by: Debbrah Alar on: 04/04/2013 05:19 PM   Modules accepted: Orders

## 2013-04-04 NOTE — Telephone Encounter (Signed)
Notified pt and she voices understanding. 

## 2013-04-04 NOTE — Telephone Encounter (Signed)
Pt states she has been using vagisil for the itching around her vagina as previously recommended by [place]. States she was seen for this previously and was told it was due to her age. Pt wants to know if she should be referred to GYN or is there something else she should try?

## 2013-04-04 NOTE — Telephone Encounter (Signed)
I will refer to GYN.

## 2013-04-08 ENCOUNTER — Telehealth: Payer: Self-pay | Admitting: *Deleted

## 2013-04-08 MED ORDER — ATORVASTATIN CALCIUM 20 MG PO TABS
20.0000 mg | ORAL_TABLET | Freq: Every day | ORAL | Status: DC
Start: 2013-04-08 — End: 2013-07-21

## 2013-04-08 NOTE — Telephone Encounter (Signed)
Received message from pt that she does not want to use [facility] and would like atorvastatin refill to [facility].  Refill sent. Pt notified.

## 2013-04-16 ENCOUNTER — Encounter: Payer: Self-pay | Admitting: Family Medicine

## 2013-04-16 ENCOUNTER — Other Ambulatory Visit: Payer: Self-pay | Admitting: Family Medicine

## 2013-04-16 ENCOUNTER — Ambulatory Visit (INDEPENDENT_AMBULATORY_CARE_PROVIDER_SITE_OTHER): Payer: Medicare Other | Admitting: Family Medicine

## 2013-04-16 VITALS — BP 120/80 | HR 70 | Temp 97.0°F | Ht 63.25 in | Wt 181.5 lb

## 2013-04-16 DIAGNOSIS — E119 Type 2 diabetes mellitus without complications: Secondary | ICD-10-CM

## 2013-04-16 DIAGNOSIS — I10 Essential (primary) hypertension: Secondary | ICD-10-CM

## 2013-04-16 DIAGNOSIS — R300 Dysuria: Secondary | ICD-10-CM | POA: Diagnosis not present

## 2013-04-16 DIAGNOSIS — B379 Candidiasis, unspecified: Secondary | ICD-10-CM

## 2013-04-16 LAB — POCT URINALYSIS DIPSTICK
Bilirubin, UA: NEGATIVE
Blood, UA: NEGATIVE
Glucose, UA: NEGATIVE
KETONES UA: NEGATIVE
NITRITE UA: POSITIVE
PROTEIN UA: NEGATIVE
Spec Grav, UA: 1.01
Urobilinogen, UA: NEGATIVE
pH, UA: 7.5

## 2013-04-16 MED ORDER — NYSTATIN-TRIAMCINOLONE 100000-0.1 UNIT/GM-% EX OINT: 1.0000 "application " | TOPICAL_OINTMENT | Freq: Two times a day (BID) | CUTANEOUS | Status: DC

## 2013-04-16 MED ORDER — FLUCONAZOLE 150 MG PO TABS: ORAL_TABLET | ORAL | Status: DC

## 2013-04-16 MED ORDER — NITROFURANTOIN MONOHYD MACRO 100 MG PO CAPS: 100.0000 mg | ORAL_CAPSULE | Freq: Two times a day (BID) | ORAL | Status: DC

## 2013-04-16 NOTE — Patient Instructions (Addendum)
Can use Loratadine 10 mg once to twice daily this week and Benadryl only at night Start a probiotic daily such as Digestive Advantage or Align to help with recovery    Yeast Infection of the Skin Some yeast on the skin is normal, but sometimes it causes an infection. If you have a yeast infection, it shows up as white or light brown patches on brown skin. You can see it better in the summer on tan skin. It causes light-colored holes in your suntan. It can happen on any area of the body. This cannot be passed from person to person. HOME CARE  Scrub your skin daily with a dandruff shampoo. Your rash may take a couple weeks to get well.  Do not scratch or itch the rash. GET HELP RIGHT AWAY IF:   You get another infection from scratching. The skin may get warm, red, and may ooze fluid.  The infection does not seem to be getting better. MAKE SURE YOU:  Understand these instructions.  Will watch your condition.  Will get help right away if you are not doing well or get worse. Document Released: 01/10/2008 Document Revised: 04/21/2011 Document Reviewed: 01/10/2008 [facility] Patient Information 2014 [place]. Urinary Tract Infection Urinary tract infections (UTIs) can develop anywhere along your urinary tract. Your urinary tract is your body's drainage system for removing wastes and extra water. Your urinary tract includes two kidneys, two ureters, a bladder, and a urethra. Your kidneys are a pair of bean-shaped organs. Each kidney is about the size of your fist. They are located below your ribs, one on each side of your spine. CAUSES Infections are caused by microbes, which are microscopic organisms, including fungi, viruses, and bacteria. These organisms are so small that they can only be seen through a microscope. Bacteria are the microbes that most commonly cause UTIs. SYMPTOMS  Symptoms of UTIs may vary by age and gender of the patient and by the location of the infection. Symptoms  in young women typically include a frequent and intense urge to urinate and a painful, burning feeling in the bladder or urethra during urination. Older women and men are more likely to be tired, shaky, and weak and have muscle aches and abdominal pain. A fever may mean the infection is in your kidneys. Other symptoms of a kidney infection include pain in your back or sides below the ribs, nausea, and vomiting. DIAGNOSIS To diagnose a UTI, your caregiver will ask you about your symptoms. Your caregiver also will ask to provide a urine sample. The urine sample will be tested for bacteria and white blood cells. White blood cells are made by your body to help fight infection. TREATMENT  Typically, UTIs can be treated with medication. Because most UTIs are caused by a bacterial infection, they usually can be treated with the use of antibiotics. The choice of antibiotic and length of treatment depend on your symptoms and the type of bacteria causing your infection. HOME CARE INSTRUCTIONS  If you were prescribed antibiotics, take them exactly as your caregiver instructs you. Finish the medication even if you feel better after you have only taken some of the medication.  Drink enough water and fluids to keep your urine clear or pale yellow.  Avoid caffeine, tea, and carbonated beverages. They tend to irritate your bladder.  Empty your bladder often. Avoid holding urine for long periods of time.  Empty your bladder before and after sexual intercourse.  After a bowel movement, women should cleanse from  front to back. Use each tissue only once. SEEK MEDICAL CARE IF:   You have back pain.  You develop a fever.  Your symptoms do not begin to resolve within 3 days. SEEK IMMEDIATE MEDICAL CARE IF:   You have severe back pain or lower abdominal pain.  You develop chills.  You have nausea or vomiting.  You have continued burning or discomfort with urination. MAKE SURE YOU:   Understand these  instructions.  Will watch your condition.  Will get help right away if you are not doing well or get worse. Document Released: 11/06/2004 Document Revised: 07/29/2011 Document Reviewed: 03/07/2011 [facility] Patient Information 2014 [place].

## 2013-04-16 NOTE — Assessment & Plan Note (Signed)
Started on Diflucan and nystatin/triamcinolone ointment. Has an appt with gyn later this week encouraged to keep appt due to a lichen appearance on labia

## 2013-04-16 NOTE — Progress Notes (Signed)
Patient ID: Kimberly Lin, female   DOB: 1945/08/07, 68 y.o.   MRN: [id number] CHARDAE BUCZEK [id number] 03/01/1945 04/16/2013      Progress Note-Follow Up  Subjective  Chief Complaint  Chief Complaint  Patient presents with  . Urticaria    started a week ago. Started with inner thighs, now back of legs & stomach. Tried Benadryl, Lotrisone,hydrocortisone, & cornstarch. No relief.     HPI  Patient is a 68 year old Caucasian female who is in today complaining of one-week course of worsening vaginal itching and now the skin on her inner thighs is also itchy and erythematous. The rash is spreading and is making warm. She reports Benadryl has helped the itching only slightly. She's tried Lotrisone and hydrocortisone cream without any great improvement. No fevers or chills. She notes a scant vaginal discharge but no odor or significant color. No change in bowel habits. No abdominal or back pain. No shortness of breath chest pain or palpitations.  Past Medical History  Diagnosis Date  . Arthritis     osteoarthritis  . Fibromyalgia   . History of chicken pox   . Depression   . Diabetes mellitus   . GERD (gastroesophageal reflux disease)   . Hyperlipidemia   . Hypertension   . Migraine   . Personal history of colonic polyps   . History of septic shock 12/2009  . Spinal stenosis   . OSA (obstructive sleep apnea) 03/12/2011  . Hiatal hernia   . Abdominal mass   . Anxiety   . Colitis 2014    Past Surgical History  Procedure Laterality Date  . Breast biopsy Right 2007, 2009  . Appendectomy  1983  . Joint replacement Bilateral 2006    bilateral partial knee replacement  . Ovarian cyst removal  2008    Pt states she had a ?cyst removed ?ovaries  . Polypectomy      Family History  Problem Relation Age of Onset  . Arthritis Mother   . Hyperlipidemia Mother   . Hypertension Mother   . Diabetes Mother   . Heart disease Mother   . Lung cancer Brother     lung, prostate  . Heart  disease Brother     x 2  . Hypertension Brother   . Hyperlipidemia Brother   . Diabetes Brother   . Sudden death Father   . Diabetes Sister   . Hyperlipidemia Sister   . Hypertension Sister   . Diabetes Maternal Aunt   . Heart attack Maternal Aunt   . Hyperlipidemia Maternal Aunt   . Hypertension Maternal Aunt   . Hypertension Maternal Uncle   . Hyperlipidemia Maternal Uncle   . Heart attack Maternal Uncle   . Diabetes Maternal Uncle   . Diabetes Paternal Aunt   . Heart attack Paternal Aunt   . Hyperlipidemia Paternal Aunt   . Hypertension Paternal Aunt   . Hypertension Paternal Uncle   . Hyperlipidemia Paternal Uncle   . Heart attack Paternal Uncle   . Diabetes Paternal Uncle   . Breast cancer Mother   . Lung cancer Mother   . Rectal cancer Father     History   Social History  . Marital Status: Married    Spouse Name: N/A    Number of Children: 2  . Years of Education: N/A   Occupational History  . Not on file.   Social History Main Topics  . Smoking status: Former Smoker    Types: Cigarettes  . Smokeless tobacco:  Never Used     Comment: Smoked socially.  . Alcohol Use: 3.6 - 4.8 oz/week    6-8 Glasses of wine per week  . Drug Use: No  . Sexual Activity: Not on file   Other Topics Concern  . Not on file   Social History Narrative   Regular exercise:  No   Caffeine use: 1 cup daily          Current Outpatient Prescriptions on File Prior to Visit  Medication Sig Dispense Refill  . ALPRAZolam (XANAX) 1 MG tablet TAKE ONE TABLET BY MOUTH TWICE DAILY AS NEEDED  180 tablet  1  . amLODipine (NORVASC) 5 MG tablet TAKE ONE TABLET BY MOUTH ONCE DAILY  30 tablet  2  . aspirin 81 MG tablet Take 81 mg by mouth daily.        Marland Kitchen atorvastatin (LIPITOR) 20 MG tablet Take 1 tablet (20 mg total) by mouth daily.  90 tablet  0  . Calcium Carbonate-Vitamin D (CALCIUM-D) 600-400 MG-UNIT TABS Take 1 tablet by mouth daily.       . Cholecalciferol (D-3-5) 5000 UNITS capsule  Take 5,000 Units by mouth daily.        Marland Kitchen co-enzyme Q-10 30 MG capsule Take 100 mg by mouth daily.        . diphenhydrAMINE (BENADRYL) 25 MG tablet Take 25 mg by mouth every 6 (six) hours as needed.      . diphenoxylate-atropine (LOMOTIL) 2.5-0.025 MG per tablet Take 1 tablet by mouth 4 (four) times daily as needed for diarrhea or loose stools.  30 tablet  0  . DULoxetine (CYMBALTA) 30 MG capsule Take 2 capsules (60 mg total) by mouth daily.  60 capsule  3  . glucose blood (ACCU-CHEK AVIVA) test strip Use as instructed to check blood sugar 1-2 times daily.  Dx 250.00.  100 each  3  . hydrochlorothiazide (HYDRODIURIL) 25 MG tablet Take 1 tablet (25 mg total) by mouth daily.  30 tablet  3  . Lancets (ACCU-CHEK MULTICLIX) lancets Use as instructed to check blood sugar two times daily.  Dx 250.00  100 each  2  . losartan (COZAAR) 25 MG tablet Take 1 tablet (25 mg total) by mouth daily.  30 tablet  2  . mesalamine (LIALDA) 1.2 G EC tablet Take 2 tablets (2.4 g total) by mouth daily with breakfast.  48 tablet  0  . metFORMIN (GLUCOPHAGE) 500 MG tablet TAKE TWO TABLETS BY MOUTH TWICE DAILY WITH MEALS  120 tablet  1  . metoprolol (LOPRESSOR) 50 MG tablet TAKE ONE-HALF TABLET BY MOUTH TWICE DAILY  30 tablet  3  . Multiple Vitamin (MULTIVITAMIN) tablet Take 1 tablet by mouth daily.        . Omega-3 Fatty Acids (FISH OIL) 1000 MG CAPS 2 caps by mouth twice daily    0  . pantoprazole (PROTONIX) 40 MG tablet TAKE ONE TABLET BY MOUTH EVERY DAY  30 tablet  3  . potassium chloride SA (K-DUR,KLOR-CON) 20 MEQ tablet 2 tabs by mouth now, repeat in 12 hours.  4 tablet  0  . terbinafine (LAMISIL AT) 1 % cream Apply topically 2 (two) times daily.  30 g  0  . venlafaxine XR (EFFEXOR-XR) 75 MG 24 hr capsule TAKE TWO CAPSULES BY MOUTH ONCE DAILY  60 capsule  3  . zolpidem (AMBIEN) 10 MG tablet Take 1 tablet (10 mg total) by mouth at bedtime as needed for sleep.  90 tablet  1   No current facility-administered medications  on file prior to visit.    Allergies  Allergen Reactions  . Prednisone Other (See Comments)    "Insomnia"    Review of Systems  Review of Systems  Constitutional: Negative for fever and malaise/fatigue.  HENT: Negative for congestion.   Eyes: Negative for discharge.  Respiratory: Negative for shortness of breath.   Cardiovascular: Negative for chest pain, palpitations and leg swelling.  Gastrointestinal: Negative for abdominal pain, diarrhea, constipation, blood in stool and melena.  Genitourinary: Positive for dysuria. Negative for urgency, frequency, hematuria and flank pain.  Musculoskeletal: Negative for back pain, falls and myalgias.  Skin: Positive for itching and rash.  Neurological: Negative for loss of consciousness and headaches.  Endo/Heme/Allergies: Negative for polydipsia.  Psychiatric/Behavioral: Negative for depression and suicidal ideas. The patient is not nervous/anxious and does not have insomnia.     Objective  BP 120/80  Pulse 70  Temp(Src) 97 F (36.1 C) (Oral)  Ht 5' 3.25" (1.607 m)  Wt 181 lb 8 oz (82.328 kg)  BMI 31.88 kg/m2  SpO2 98%  Physical Exam  Physical Exam  Nursing note and vitals reviewed. Constitutional: She appears well-developed and well-nourished.     Lab Results  Component Value Date   TSH 2.199 06/17/2011   Lab Results  Component Value Date   WBC 8.6 01/18/2013   HGB 14.0 01/18/2013   HCT 41.2 01/18/2013   MCV 93.6 01/18/2013   PLT 296.0 01/18/2013   Lab Results  Component Value Date   CREATININE 0.70 01/10/2013   BUN 11 01/10/2013   NA 137 01/10/2013   K 3.3* 01/10/2013   CL 98 01/10/2013   CO2 26 01/10/2013   Lab Results  Component Value Date   ALT 18 01/10/2013   AST 25 01/10/2013   ALKPHOS 59 01/10/2013   BILITOT 0.6 01/10/2013   Lab Results  Component Value Date   CHOL 156 12/13/2012   Lab Results  Component Value Date   HDL 46 12/13/2012   Lab Results  Component Value Date   LDLCALC 89 12/13/2012   Lab  Results  Component Value Date   TRIG 106 12/13/2012   Lab Results  Component Value Date   CHOLHDL 3.4 12/13/2012     Assessment & Plan  Hypertension Well controlled today despite infection, no changes  Type II or unspecified type diabetes mellitus without mention of complication, not stated as uncontrolled Encouraged to minimize carbs to help with recovery  Candida infection Started on Diflucan and nystatin/triamcinolone ointment. Has an appt with gyn later this week encouraged to keep appt due to a lichen appearance on labia  Dysuria Dip is suspicious for uti, will send for culture and start Macrobid, encouraged probiotics and increased fluids

## 2013-04-16 NOTE — Assessment & Plan Note (Signed)
Dip is suspicious for uti, will send for culture and start Macrobid, encouraged probiotics and increased fluids

## 2013-04-16 NOTE — Progress Notes (Signed)
Pre-visit discussion using our clinic review tool. No additional management support is needed unless otherwise documented below in the visit note.  

## 2013-04-16 NOTE — Assessment & Plan Note (Signed)
Encouraged to minimize carbs to help with recovery

## 2013-04-16 NOTE — Assessment & Plan Note (Signed)
Well controlled today despite infection, no changes

## 2013-04-17 ENCOUNTER — Other Ambulatory Visit: Payer: Self-pay | Admitting: Family

## 2013-04-18 ENCOUNTER — Telehealth: Payer: Self-pay | Admitting: *Deleted

## 2013-04-18 NOTE — Telephone Encounter (Signed)
Office Message [address] [place], [place] [address] [address] To: Jarome Lamas (After Hours Triage) Fax: [phone] From: [facility] Date/ Time: 04/15/2013 7:31 PM Taken By: Marcello Moores, CSR Caller: [place]: not collected Patient: Kimberly Lin, Kimberly Lin DOB: 01/18/1946 Phone: [phone] Reason for Call: See info below Regarding Appointment: Yes Appt Date: 04/16/2013 Appt Time: 10:30:00 AM Provider: Reason: Scheduled Appointment Details: Rash/Hives Outcome: Scheduled appointment in

## 2013-04-19 ENCOUNTER — Telehealth: Payer: Self-pay

## 2013-04-19 LAB — CULTURE, URINE COMPREHENSIVE: Colony Count: >=100000

## 2013-04-19 NOTE — Telephone Encounter (Signed)
Patient left a message stating that she is [name]'s patient however she saw Dr Charlett Blake Saturday and she would like to know her test results?  Please advise?

## 2013-04-19 NOTE — Telephone Encounter (Signed)
She has a Klebsiella UTI which I treated with Macrobid which the sensitivities should treat it well. If she is still symptomatic but she improved some we can give a few more days of Macrobid. If it did not help we can switch to a different antibiotic but the problem with that is that anything else will mess with her vaginitis more

## 2013-04-19 NOTE — Telephone Encounter (Signed)
Pt informed and states she is not having any issues or concerns was just interested in what the test showed

## 2013-04-20 DIAGNOSIS — L940 Localized scleroderma [morphea]: Secondary | ICD-10-CM | POA: Diagnosis not present

## 2013-04-20 DIAGNOSIS — N766 Ulceration of vulva: Secondary | ICD-10-CM | POA: Diagnosis not present

## 2013-04-22 ENCOUNTER — Encounter: Payer: Medicare Other | Admitting: Obstetrics and Gynecology

## 2013-05-07 ENCOUNTER — Other Ambulatory Visit: Payer: Self-pay | Admitting: Family

## 2013-05-09 DIAGNOSIS — L940 Localized scleroderma [morphea]: Secondary | ICD-10-CM | POA: Diagnosis not present

## 2013-05-09 DIAGNOSIS — A6000 Herpesviral infection of urogenital system, unspecified: Secondary | ICD-10-CM | POA: Diagnosis not present

## 2013-05-09 NOTE — Telephone Encounter (Signed)
Refill sent for pt's pantoprazole.  Pt is due for follow up of her cholesterol, diabetes and hypertension. Last couple of visits have been for acute issues and chronic health conditions were not addressed.  Please call pt to arrange appt within the next month.

## 2013-05-09 NOTE — Telephone Encounter (Signed)
Left detailed message informing patient of medication refill and to call our office to schedule appointment °

## 2013-05-09 NOTE — Telephone Encounter (Signed)
Pt called back and was notified of need for follow up. Pt voices understanding and states she is currently driving and will call back this week to schedule an appt.

## 2013-05-22 ENCOUNTER — Other Ambulatory Visit: Payer: Self-pay | Admitting: Family

## 2013-06-20 ENCOUNTER — Telehealth: Payer: Self-pay | Admitting: *Deleted

## 2013-06-20 ENCOUNTER — Other Ambulatory Visit: Payer: Self-pay | Admitting: Family Medicine

## 2013-06-20 ENCOUNTER — Other Ambulatory Visit: Payer: Self-pay | Admitting: Family

## 2013-06-20 NOTE — Telephone Encounter (Signed)
OL'M printed for md to sign and fax  Last RX for Alprazolam was 12-29-12 quantity 180 with 1 refill  Last RX for Zolpidem  Was 12-29-12 quantity 90 with 1 refill

## 2013-06-20 NOTE — Telephone Encounter (Signed)
Pt left message wanting to know the name of the pain / spine doctor that we wanted her to see?  Please advise.

## 2013-06-20 NOTE — Telephone Encounter (Signed)
eScribe request from [facility] for refill on Amlodipine, Duloxetine, HCTZ & Metoprolol Last filled - Amlodipine: 11.26.14, #30x2; Duloxetine: 11.20.14, #60x2; HCTZ: 11.21.14, #30x3; Metoprolol: 12.28.14, #30x3 Last AEX - 12.01.14 [had acute w/Dr Charlett Blake 03.07.15; Last A1C checked 09.05.14] Next AEX - 1-Wk. [ROV with exception of acute] Please Advise on refills/SLS

## 2013-06-20 NOTE — Telephone Encounter (Signed)
I do not see that we ever referred her.  Since I haven't seen her in 6 months, I would recommend that she make apt for OV so I can evaluate her and make appropriate recommendation.

## 2013-06-21 MED ORDER — VENLAFAXINE HCL ER 75 MG PO CP24: ORAL_CAPSULE | ORAL | Status: DC

## 2013-06-21 NOTE — Telephone Encounter (Signed)
Refills sent and pt transferred to front office to arrange appt.

## 2013-06-21 NOTE — Telephone Encounter (Signed)
OK to send 30 day supply of each but needs OV prior to additional refills.

## 2013-06-21 NOTE — Telephone Encounter (Signed)
Spoke with pt and she states she found the doctor's name and she has already seen him in the past. Pt also requesting refill of venlafaxine. Refill sent and transferred pt to front office to arrange follow up before further refills are due.

## 2013-06-23 ENCOUNTER — Telehealth: Payer: Self-pay | Admitting: Family

## 2013-06-23 DIAGNOSIS — E119 Type 2 diabetes mellitus without complications: Secondary | ICD-10-CM

## 2013-06-23 NOTE — Telephone Encounter (Signed)
Patient has appointment next Friday and wants to know if she could come in early next week to do labs. She is also requesting urinalysis to be done.

## 2013-06-24 NOTE — Telephone Encounter (Signed)
See labs below. Will do urinalysis at her apt.

## 2013-06-24 NOTE — Telephone Encounter (Signed)
Spoke with pt. She thinks she may have UTI. Has some intermittent burning with urination. Rescheduled pt's f/u for 06/28/13 at 10:30am and will address f/u / ?uti and labs at same visit.

## 2013-06-28 ENCOUNTER — Ambulatory Visit (INDEPENDENT_AMBULATORY_CARE_PROVIDER_SITE_OTHER): Payer: Medicare Other | Admitting: Family

## 2013-06-28 ENCOUNTER — Telehealth: Payer: Self-pay | Admitting: Family

## 2013-06-28 ENCOUNTER — Encounter: Payer: Self-pay | Admitting: Family

## 2013-06-28 VITALS — BP 130/90 | HR 80 | Temp 98.0°F | Resp 16 | Ht 63.5 in | Wt 185.0 lb

## 2013-06-28 DIAGNOSIS — N644 Mastodynia: Secondary | ICD-10-CM | POA: Insufficient documentation

## 2013-06-28 DIAGNOSIS — D229 Melanocytic nevi, unspecified: Secondary | ICD-10-CM

## 2013-06-28 DIAGNOSIS — I10 Essential (primary) hypertension: Secondary | ICD-10-CM

## 2013-06-28 DIAGNOSIS — E785 Hyperlipidemia, unspecified: Secondary | ICD-10-CM | POA: Diagnosis not present

## 2013-06-28 DIAGNOSIS — F32A Depression, unspecified: Secondary | ICD-10-CM

## 2013-06-28 DIAGNOSIS — D239 Other benign neoplasm of skin, unspecified: Secondary | ICD-10-CM

## 2013-06-28 DIAGNOSIS — R300 Dysuria: Secondary | ICD-10-CM

## 2013-06-28 DIAGNOSIS — E119 Type 2 diabetes mellitus without complications: Secondary | ICD-10-CM

## 2013-06-28 DIAGNOSIS — F3289 Other specified depressive episodes: Secondary | ICD-10-CM

## 2013-06-28 DIAGNOSIS — F329 Major depressive disorder, single episode, unspecified: Secondary | ICD-10-CM

## 2013-06-28 LAB — POCT URINALYSIS DIPSTICK
Bilirubin, UA: NEGATIVE
Glucose, UA: NEGATIVE
Ketones, UA: NEGATIVE
Leukocytes, UA: NEGATIVE
Nitrite, UA: NEGATIVE
PH UA: 6
Protein, UA: NEGATIVE
RBC UA: NEGATIVE
Spec Grav, UA: 1.015
Urobilinogen, UA: 0.2

## 2013-06-28 MED ORDER — ESCITALOPRAM OXALATE 10 MG PO TABS: ORAL_TABLET | ORAL | Status: DC

## 2013-06-28 NOTE — Assessment & Plan Note (Signed)
BP stable, continue current meds. 

## 2013-06-28 NOTE — Assessment & Plan Note (Signed)
Will obtain follow up FLP/LFT. Continue statin.

## 2013-06-28 NOTE — Assessment & Plan Note (Signed)
She is also on cymbalta, D/C cymbalta, continue effexor, add Lexapro. Follow up in 1 month.

## 2013-06-28 NOTE — Assessment & Plan Note (Signed)
Obtain a1c, urine microalbumin. Continue metformin.

## 2013-06-28 NOTE — Patient Instructions (Signed)
Stop cymbalta, start lexapro. Complete lab work prior to leaving.  Follow up in 1 month.

## 2013-06-28 NOTE — Assessment & Plan Note (Signed)
Normal breast exam today.  Normal mammogram 1/15.  Reassurance provided. Advised pt to call if symptoms worsen or if they do not improve.

## 2013-06-28 NOTE — Progress Notes (Signed)
Pre visit review using our clinic review tool, if applicable. No additional management support is needed unless otherwise documented below in the visit note. 

## 2013-06-28 NOTE — Progress Notes (Signed)
Subjective:    Patient ID: Kimberly Lin, female    DOB: 10/20/1945, 68 y.o.   MRN: [id number]  HPI  Kimberly Lin is a 68 yr old female who presents today for follow up of multiple medical problems.  1) Depression- Pt feels that she is in a "deep depression." Lost her brother and a friend within 6 weeks of each other. She is continued on effexor 150mg .  Has been up to 225 in the past.    2) Hypertension Pt here for follow up. Has not taken medication this morning.  BP Readings from Last 3 Encounters:  06/28/13 130/90  04/16/13 120/80  02/22/13 130/88   3) Hyperlipidemia Pt here for follow up.  Maintained on lipitor.   Lab Results  Component Value Date   CHOL 156 12/13/2012   HDL 46 12/13/2012   LDLCALC 89 12/13/2012   TRIG 106 12/13/2012   CHOLHDL 3.4 12/13/2012     4) Diabetes- not checking sugars regularly.   5) Dysuria Pt here for follow up Nevus Pt requests referral to dermatologist for mole removals.   6) Breast Pain Pt continues to have sharp left breast pain. Pain in the lower left breast.  Wants to have ultrasound. Had negative mammogram 02/21/13.   7) Dysuria- reports mild dysuria.   Seeing Dr. Maryjean Ka for spinal stenosis.    Review of Systems  See HPI  Past Medical History  Diagnosis Date  . Arthritis     osteoarthritis  . Fibromyalgia   . History of chicken pox   . Depression   . Diabetes mellitus   . GERD (gastroesophageal reflux disease)   . Hyperlipidemia   . Hypertension   . Migraine   . Personal history of colonic polyps   . History of septic shock 12/2009  . Spinal stenosis   . OSA (obstructive sleep apnea) 03/12/2011  . Hiatal hernia   . Abdominal mass   . Anxiety   . Colitis 2014    History   Social History  . Marital Status: Married    Spouse Name: N/A    Number of Children: 2  . Years of Education: N/A   Occupational History  . Not on file.   Social History Main Topics  . Smoking status: Former Smoker    Types: Cigarettes  .  Smokeless tobacco: Never Used     Comment: Smoked socially.  . Alcohol Use: 3.6 - 4.8 oz/week    6-8 Glasses of wine per week  . Drug Use: No  . Sexual Activity: Not on file   Other Topics Concern  . Not on file   Social History Narrative   Regular exercise:  No   Caffeine use: 1 cup daily          Past Surgical History  Procedure Laterality Date  . Breast biopsy Right 2007, 2009  . Appendectomy  1983  . Joint replacement Bilateral 2006    bilateral partial knee replacement  . Ovarian cyst removal  2008    Pt states she had a ?cyst removed ?ovaries  . Polypectomy      Family History  Problem Relation Age of Onset  . Arthritis Mother   . Hyperlipidemia Mother   . Hypertension Mother   . Diabetes Mother   . Heart disease Mother   . Lung cancer Brother     lung, prostate  . Heart disease Brother     x 2  . Hypertension Brother   . Hyperlipidemia Brother   .  Diabetes Brother   . Sudden death Father   . Diabetes Sister   . Hyperlipidemia Sister   . Hypertension Sister   . Diabetes Maternal Aunt   . Heart attack Maternal Aunt   . Hyperlipidemia Maternal Aunt   . Hypertension Maternal Aunt   . Hypertension Maternal Uncle   . Hyperlipidemia Maternal Uncle   . Heart attack Maternal Uncle   . Diabetes Maternal Uncle   . Diabetes Paternal Aunt   . Heart attack Paternal Aunt   . Hyperlipidemia Paternal Aunt   . Hypertension Paternal Aunt   . Hypertension Paternal Uncle   . Hyperlipidemia Paternal Uncle   . Heart attack Paternal Uncle   . Diabetes Paternal Uncle   . Breast cancer Mother   . Lung cancer Mother   . Rectal cancer Father     Allergies  Allergen Reactions  . Prednisone Other (See Comments)    "Insomnia"    Current Outpatient Prescriptions on File Prior to Visit  Medication Sig Dispense Refill  . ALPRAZolam (XANAX) 1 MG tablet TAKE ONE TABLET BY MOUTH TWICE DAILY AS NEEDED  180 tablet  0  . amLODipine (NORVASC) 5 MG tablet TAKE ONE TABLET BY  MOUTH ONCE DAILY  30 tablet  0  . aspirin 81 MG tablet Take 81 mg by mouth daily.        Marland Kitchen atorvastatin (LIPITOR) 20 MG tablet Take 1 tablet (20 mg total) by mouth daily.  90 tablet  0  . Calcium Carbonate-Vitamin D (CALCIUM-D) 600-400 MG-UNIT TABS Take 1 tablet by mouth daily.       . Cholecalciferol (D-3-5) 5000 UNITS capsule Take 5,000 Units by mouth daily.        Marland Kitchen co-enzyme Q-10 30 MG capsule Take 100 mg by mouth daily.        . diphenhydrAMINE (BENADRYL) 25 MG tablet Take 25 mg by mouth every 6 (six) hours as needed.      . diphenoxylate-atropine (LOMOTIL) 2.5-0.025 MG per tablet Take 1 tablet by mouth 4 (four) times daily as needed for diarrhea or loose stools.  30 tablet  0  . fluconazole (DIFLUCAN) 150 MG tablet 1 tab po daily x 3 days then 1 tab a week later  4 tablet  0  . glucose blood (ACCU-CHEK AVIVA) test strip Use as instructed to check blood sugar 1-2 times daily.  Dx 250.00.  100 each  3  . hydrochlorothiazide (HYDRODIURIL) 25 MG tablet TAKE ONE TABLET BY MOUTH ONCE DAILY  30 tablet  0  . Lancets (ACCU-CHEK MULTICLIX) lancets Use as instructed to check blood sugar two times daily.  Dx 250.00  100 each  2  . losartan (COZAAR) 25 MG tablet Take 1 tablet (25 mg total) by mouth daily.  30 tablet  2  . mesalamine (LIALDA) 1.2 G EC tablet Take 2 tablets (2.4 g total) by mouth daily with breakfast.  48 tablet  0  . metFORMIN (GLUCOPHAGE) 500 MG tablet TAKE TWO TABLETS BY MOUTH TWICE DAILY WITH MEALS  120 tablet  2  . metoprolol (LOPRESSOR) 50 MG tablet TAKE ONE-HALF TABLET BY MOUTH TWICE DAILY  30 tablet  0  . Multiple Vitamin (MULTIVITAMIN) tablet Take 1 tablet by mouth daily.        . nitrofurantoin, macrocrystal-monohydrate, (MACROBID) 100 MG capsule Take 1 capsule (100 mg total) by mouth 2 (two) times daily.  6 capsule  0  . nystatin-triamcinolone ointment (MYCOLOG) Apply 1 application topically 2 (two) times daily.  60 g  0  . Omega-3 Fatty Acids (FISH OIL) 1000 MG CAPS 2 caps by  mouth twice daily    0  . pantoprazole (PROTONIX) 40 MG tablet TAKE ONE TABLET BY MOUTH ONCE DAILY  30 tablet  3  . potassium chloride SA (K-DUR,KLOR-CON) 20 MEQ tablet 2 tabs by mouth now, repeat in 12 hours.  4 tablet  0  . terbinafine (LAMISIL AT) 1 % cream Apply topically 2 (two) times daily.  30 g  0  . venlafaxine XR (EFFEXOR-XR) 75 MG 24 hr capsule TAKE TWO CAPSULES BY MOUTH ONCE DAILY  60 capsule  0  . zolpidem (AMBIEN) 10 MG tablet TAKE ONE TABLET BY MOUTH AT BEDTIME AS NEEDED FOR SLEEP  90 tablet  0   No current facility-administered medications on file prior to visit.    BP 130/90  Pulse 80  Temp(Src) 98 F (36.7 C) (Oral)  Resp 16  Ht 5' 3.5" (1.613 m)  Wt 185 lb (83.915 kg)  BMI 32.25 kg/m2  SpO2 99%       Objective:   Physical Exam  Constitutional: She is oriented to person, place, and time. She appears well-developed and well-nourished. No distress.  HENT:  Head: Normocephalic and atraumatic.  Cardiovascular: Normal rate and regular rhythm.   No murmur heard. Pulmonary/Chest: Effort normal and breath sounds normal. No respiratory distress. She has no wheezes. She has no rales. She exhibits no tenderness.  Musculoskeletal: She exhibits no edema.  Neurological: She is alert and oriented to person, place, and time.  Psychiatric: She has a normal mood and affect. Her behavior is normal. Judgment and thought content normal.  Breast:  Bilateral breast exam is normal without masses, erythema, rash        Assessment & Plan:

## 2013-06-28 NOTE — Telephone Encounter (Signed)
Relevant patient education assigned to patient using Emmi. ° °

## 2013-06-28 NOTE — Assessment & Plan Note (Signed)
UA is unremarkable.  Obtain urine culture to further evaluate.

## 2013-06-29 ENCOUNTER — Telehealth: Payer: Self-pay | Admitting: Family

## 2013-06-29 LAB — HEPATIC FUNCTION PANEL
ALBUMIN: 4.4 g/dL (ref 3.5–5.2)
ALT: 30 U/L (ref 0–35)
AST: 33 U/L (ref 0–37)
Alkaline Phosphatase: 54 U/L (ref 39–117)
BILIRUBIN TOTAL: 0.5 mg/dL (ref 0.2–1.2)
Bilirubin, Direct: 0.1 mg/dL (ref 0.0–0.3)
Indirect Bilirubin: 0.4 mg/dL (ref 0.2–1.2)
Total Protein: 7.1 g/dL (ref 6.0–8.3)

## 2013-06-29 LAB — HEMOGLOBIN A1C
HEMOGLOBIN A1C: 7.1 % — AB (ref <5.7)
MEAN PLASMA GLUCOSE: 157 mg/dL — AB (ref <117)

## 2013-06-29 LAB — BASIC METABOLIC PANEL WITH GFR
BUN: 13 mg/dL (ref 6–23)
CO2: 28 meq/L (ref 19–32)
Calcium: 9.8 mg/dL (ref 8.4–10.5)
Chloride: 93 mEq/L — ABNORMAL LOW (ref 96–112)
Creat: 0.76 mg/dL (ref 0.50–1.10)
GFR, EST NON AFRICAN AMERICAN: 81 mL/min
GFR, Est African American: >89 mL/min
Glucose, Bld: 143 mg/dL — ABNORMAL HIGH (ref 70–99)
POTASSIUM: 4.7 meq/L (ref 3.5–5.3)
Sodium: 131 mEq/L — ABNORMAL LOW (ref 135–145)

## 2013-06-29 LAB — MICROALBUMIN / CREATININE URINE RATIO

## 2013-06-29 LAB — LIPID PANEL
CHOL/HDL RATIO: 2.9 ratio
Cholesterol: 150 mg/dL (ref 0–200)
HDL: 52 mg/dL (ref >39)
LDL Cholesterol: 74 mg/dL (ref 0–99)
Triglycerides: 122 mg/dL (ref <150)
VLDL: 24 mg/dL (ref 0–40)

## 2013-06-29 MED ORDER — LOSARTAN POTASSIUM 50 MG PO TABS: 50.0000 mg | ORAL_TABLET | Freq: Every day | ORAL | Status: DC

## 2013-06-29 MED ORDER — CIPROFLOXACIN HCL 250 MG PO TABS: 250.0000 mg | ORAL_TABLET | Freq: Two times a day (BID) | ORAL | Status: DC

## 2013-06-29 MED ORDER — SITAGLIPTIN PHOSPHATE 100 MG PO TABS: 100.0000 mg | ORAL_TABLET | Freq: Every day | ORAL | Status: DC

## 2013-06-29 NOTE — Telephone Encounter (Signed)
Attempted to reach pt on cell and home, no answer.

## 2013-06-29 NOTE — Telephone Encounter (Signed)
Reviewed lab work.  Urine culture positive for gm neg rods.  Rx sent for cipro. Also sugar is above goal. Add Januvia.  Sodium is low- likely due to hctz. Stop hctz, stop KDur.  Increase losartan from 25 mg to 50mg  once daily. Follow up as scheduled.

## 2013-06-30 LAB — URINE CULTURE

## 2013-06-30 NOTE — Telephone Encounter (Signed)
Spoke with pt.  Discussed recommendations below. Pt verbalizes understanding.

## 2013-07-01 ENCOUNTER — Ambulatory Visit: Payer: Medicare Other | Admitting: Family

## 2013-07-14 ENCOUNTER — Telehealth: Payer: Self-pay | Admitting: *Deleted

## 2013-07-14 ENCOUNTER — Ambulatory Visit: Payer: Medicare Other | Admitting: Family

## 2013-07-14 DIAGNOSIS — N390 Urinary tract infection, site not specified: Secondary | ICD-10-CM

## 2013-07-14 NOTE — Telephone Encounter (Signed)
Ok to send ua and culture please.

## 2013-07-14 NOTE — Telephone Encounter (Signed)
Pt c/o still having some UTI symptoms since last visit and has upcoming visit scheduled for 06.23.15; request to have urine lab order[s] placed and allow her to come in and give urine sample/SLS Please Advise.

## 2013-07-15 ENCOUNTER — Other Ambulatory Visit: Payer: Self-pay | Admitting: Family

## 2013-07-15 NOTE — Telephone Encounter (Signed)
Left detailed message on home #, lab orders entered.

## 2013-07-19 DIAGNOSIS — M545 Low back pain, unspecified: Secondary | ICD-10-CM | POA: Diagnosis not present

## 2013-07-19 DIAGNOSIS — IMO0002 Reserved for concepts with insufficient information to code with codable children: Secondary | ICD-10-CM | POA: Diagnosis not present

## 2013-07-19 DIAGNOSIS — M47817 Spondylosis without myelopathy or radiculopathy, lumbosacral region: Secondary | ICD-10-CM | POA: Diagnosis not present

## 2013-07-21 ENCOUNTER — Other Ambulatory Visit: Payer: Self-pay | Admitting: Family

## 2013-07-27 ENCOUNTER — Other Ambulatory Visit: Payer: Self-pay | Admitting: Family Medicine

## 2013-07-27 DIAGNOSIS — D2360 Other benign neoplasm of skin of unspecified upper limb, including shoulder: Secondary | ICD-10-CM | POA: Diagnosis not present

## 2013-07-27 DIAGNOSIS — L719 Rosacea, unspecified: Secondary | ICD-10-CM | POA: Diagnosis not present

## 2013-07-27 DIAGNOSIS — D485 Neoplasm of uncertain behavior of skin: Secondary | ICD-10-CM | POA: Diagnosis not present

## 2013-07-28 NOTE — Telephone Encounter (Signed)
Please advise refill?   Last RX done on 06-20-13 quantity 180 with 0 refills for Xanax  Last RX done on 06-20-13 quantity 90 with 0 refills for Zolpidem  If ok fax to [phone]

## 2013-07-30 ENCOUNTER — Other Ambulatory Visit: Payer: Self-pay | Admitting: Family

## 2013-08-02 ENCOUNTER — Encounter: Payer: Self-pay | Admitting: Family

## 2013-08-02 ENCOUNTER — Ambulatory Visit (INDEPENDENT_AMBULATORY_CARE_PROVIDER_SITE_OTHER): Payer: Medicare Other | Admitting: Family

## 2013-08-02 VITALS — BP 120/80 | HR 81 | Temp 98.1°F | Ht 63.5 in | Wt 185.0 lb

## 2013-08-02 DIAGNOSIS — N390 Urinary tract infection, site not specified: Secondary | ICD-10-CM

## 2013-08-02 DIAGNOSIS — I10 Essential (primary) hypertension: Secondary | ICD-10-CM | POA: Diagnosis not present

## 2013-08-02 DIAGNOSIS — F329 Major depressive disorder, single episode, unspecified: Secondary | ICD-10-CM | POA: Diagnosis not present

## 2013-08-02 DIAGNOSIS — F32A Depression, unspecified: Secondary | ICD-10-CM

## 2013-08-02 DIAGNOSIS — F3289 Other specified depressive episodes: Secondary | ICD-10-CM | POA: Diagnosis not present

## 2013-08-02 DIAGNOSIS — Z87440 Personal history of urinary (tract) infections: Secondary | ICD-10-CM | POA: Diagnosis not present

## 2013-08-02 LAB — BASIC METABOLIC PANEL
BUN: 15 mg/dL (ref 6–23)
CO2: 26 meq/L (ref 19–32)
Calcium: 9.6 mg/dL (ref 8.4–10.5)
Chloride: 96 mEq/L (ref 96–112)
Creat: 0.77 mg/dL (ref 0.50–1.10)
Glucose, Bld: 119 mg/dL — ABNORMAL HIGH (ref 70–99)
Potassium: 5 mEq/L (ref 3.5–5.3)
SODIUM: 131 meq/L — AB (ref 135–145)

## 2013-08-02 MED ORDER — ESCITALOPRAM OXALATE 20 MG PO TABS: 20.0000 mg | ORAL_TABLET | Freq: Every day | ORAL | Status: DC

## 2013-08-02 NOTE — Assessment & Plan Note (Signed)
BP is currently stable. Will check follow up bmet.

## 2013-08-02 NOTE — Progress Notes (Signed)
Pre visit review using our clinic review tool, if applicable. No additional management support is needed unless otherwise documented below in the visit note. 

## 2013-08-02 NOTE — Progress Notes (Signed)
Subjective:    Patient ID: Kimberly Lin, female    DOB: [birth year], 68 y.o.   MRN: [id number]  HPI  Kimberly Lin is a 68 yr old female who presents today for follow up. Last visit she was evaluated for depression.  Cymbalta was discontinued, effexor was continued and lexapro was added. Reports depression is better but not ideal.   Wt Readings from Last 3 Encounters:  08/02/13 185 lb (83.915 kg)  06/28/13 185 lb (83.915 kg)  04/16/13 181 lb 8 oz (82.328 kg)   Also hctz was discontinued due to hyponatremia.  Instead, she was placed on losartan 50mg  from 25mg .   Review of Systems See HPI  Past Medical History  Diagnosis Date  . Arthritis     osteoarthritis  . Fibromyalgia   . History of chicken pox   . Depression   . Diabetes mellitus   . GERD (gastroesophageal reflux disease)   . Hyperlipidemia   . Hypertension   . Migraine   . Personal history of colonic polyps   . History of septic shock 12/2009  . Spinal stenosis   . OSA (obstructive sleep apnea) 03/12/2011  . Hiatal hernia   . Abdominal mass   . Anxiety   . Colitis 2014    History   Social History  . Marital Status: Married    Spouse Name: N/A    Number of Children: 2  . Years of Education: N/A   Occupational History  . Not on file.   Social History Main Topics  . Smoking status: Former Smoker    Types: Cigarettes  . Smokeless tobacco: Never Used     Comment: Smoked socially.  . Alcohol Use: 3.6 - 4.8 oz/week    6-8 Glasses of wine per week  . Drug Use: No  . Sexual Activity: Not on file   Other Topics Concern  . Not on file   Social History Narrative   Regular exercise:  No   Caffeine use: 1 cup daily          Past Surgical History  Procedure Laterality Date  . Breast biopsy Right 2007, 2009  . Appendectomy  1983  . Joint replacement Bilateral 2006    bilateral partial knee replacement  . Ovarian cyst removal  2008    Pt states she had a ?cyst removed ?ovaries  . Polypectomy       Family History  Problem Relation Age of Onset  . Arthritis Mother   . Hyperlipidemia Mother   . Hypertension Mother   . Diabetes Mother   . Heart disease Mother   . Lung cancer Brother     lung, prostate  . Heart disease Brother     x 2  . Hypertension Brother   . Hyperlipidemia Brother   . Diabetes Brother   . Sudden death Father   . Diabetes Sister   . Hyperlipidemia Sister   . Hypertension Sister   . Diabetes Maternal Aunt   . Heart attack Maternal Aunt   . Hyperlipidemia Maternal Aunt   . Hypertension Maternal Aunt   . Hypertension Maternal Uncle   . Hyperlipidemia Maternal Uncle   . Heart attack Maternal Uncle   . Diabetes Maternal Uncle   . Diabetes Paternal Aunt   . Heart attack Paternal Aunt   . Hyperlipidemia Paternal Aunt   . Hypertension Paternal Aunt   . Hypertension Paternal Uncle   . Hyperlipidemia Paternal Uncle   . Heart attack Paternal Uncle   .  Diabetes Paternal Uncle   . Breast cancer Mother   . Lung cancer Mother   . Rectal cancer Father     Allergies  Allergen Reactions  . Prednisone Other (See Comments)    "Insomnia"    Current Outpatient Prescriptions on File Prior to Visit  Medication Sig Dispense Refill  . ALPRAZolam (XANAX) 1 MG tablet TAKE ONE TABLET BY MOUTH TWICE DAILY AS NEEDED  180 tablet  0  . amLODipine (NORVASC) 5 MG tablet TAKE ONE TABLET BY MOUTH ONCE DAILY  30 tablet  0  . aspirin 81 MG tablet Take 81 mg by mouth daily.        Marland Kitchen atorvastatin (LIPITOR) 20 MG tablet TAKE ONE TABLET BY MOUTH ONCE DAILY  90 tablet  0  . Calcium Carbonate-Vitamin D (CALCIUM-D) 600-400 MG-UNIT TABS Take 1 tablet by mouth daily.       . Cholecalciferol (D-3-5) 5000 UNITS capsule Take 5,000 Units by mouth daily.        Marland Kitchen co-enzyme Q-10 30 MG capsule Take 100 mg by mouth daily.        . diphenhydrAMINE (BENADRYL) 25 MG tablet Take 25 mg by mouth every 6 (six) hours as needed.      Marland Kitchen escitalopram (LEXAPRO) 10 MG tablet 1/2 tab once daily for 1  week, then increase to a full tab once daily.  30 tablet  1  . glucose blood (ACCU-CHEK AVIVA) test strip Use as instructed to check blood sugar 1-2 times daily.  Dx 250.00.  100 each  3  . Lancets (ACCU-CHEK MULTICLIX) lancets Use as instructed to check blood sugar two times daily.  Dx 250.00  100 each  2  . losartan (COZAAR) 50 MG tablet Take 1 tablet (50 mg total) by mouth daily.  30 tablet  3  . metFORMIN (GLUCOPHAGE) 500 MG tablet TAKE TWO TABLETS BY MOUTH TWICE DAILY WITH MEALS  120 tablet  5  . metoprolol (LOPRESSOR) 50 MG tablet TAKE ONE-HALF TABLET BY MOUTH TWICE DAILY  30 tablet  5  . Multiple Vitamin (MULTIVITAMIN) tablet Take 1 tablet by mouth daily.        Marland Kitchen nystatin-triamcinolone ointment (MYCOLOG) Apply 1 application topically 2 (two) times daily.  60 g  0  . Omega-3 Fatty Acids (FISH OIL) 1000 MG CAPS 2 caps by mouth twice daily    0  . pantoprazole (PROTONIX) 40 MG tablet TAKE ONE TABLET BY MOUTH ONCE DAILY  30 tablet  3  . sitaGLIPtin (JANUVIA) 100 MG tablet Take 1 tablet (100 mg total) by mouth daily.  30 tablet  2  . terbinafine (LAMISIL AT) 1 % cream Apply topically 2 (two) times daily.  30 g  0  . venlafaxine XR (EFFEXOR-XR) 75 MG 24 hr capsule TAKE TWO CAPSULES BY MOUTH ONCE DAILY  60 capsule  3  . zolpidem (AMBIEN) 10 MG tablet TAKE ONE TABLET BY MOUTH AT BEDTIME AS NEEDED FOR SLEEP  90 tablet  0  . ciprofloxacin (CIPRO) 250 MG tablet Take 1 tablet (250 mg total) by mouth 2 (two) times daily.  10 tablet  0  . diphenoxylate-atropine (LOMOTIL) 2.5-0.025 MG per tablet Take 1 tablet by mouth 4 (four) times daily as needed for diarrhea or loose stools.  30 tablet  0  . fluconazole (DIFLUCAN) 150 MG tablet 1 tab po daily x 3 days then 1 tab a week later  4 tablet  0  . mesalamine (LIALDA) 1.2 G EC tablet Take 2 tablets (  2.4 g total) by mouth daily with breakfast.  48 tablet  0  . nitrofurantoin, macrocrystal-monohydrate, (MACROBID) 100 MG capsule Take 1 capsule (100 mg total) by  mouth 2 (two) times daily.  6 capsule  0   No current facility-administered medications on file prior to visit.    BP 120/80  Pulse 81  Temp(Src) 98.1 F (36.7 C) (Oral)  Ht 5' 3.5" (1.613 m)  Wt 185 lb (83.915 kg)  BMI 32.25 kg/m2  SpO2 97%       Objective:   Physical Exam  Constitutional: She is oriented to person, place, and time. She appears well-developed and well-nourished. No distress.  HENT:  Head: Normocephalic and atraumatic.  Cardiovascular: Normal rate and regular rhythm.   No murmur heard. Pulmonary/Chest: Effort normal and breath sounds normal. No respiratory distress. She has no wheezes. She has no rales. She exhibits no tenderness.  Neurological: She is alert and oriented to person, place, and time.  Psychiatric: She has a normal mood and affect. Her behavior is normal. Judgment and thought content normal.          Assessment & Plan:

## 2013-08-02 NOTE — Assessment & Plan Note (Signed)
Improving, but not optimally controlled. She would like to try increasing dose of lexapro. Will increase from 10mg  to 20mg  daily. Follow up in 6 weeks.

## 2013-08-02 NOTE — Patient Instructions (Addendum)
Complete lab work prior to leaving.  Increase lexapro from 10mg  to 20mg .   Follow up in 6 weeks.

## 2013-08-03 LAB — URINALYSIS, ROUTINE W REFLEX MICROSCOPIC
BILIRUBIN URINE: NEGATIVE
GLUCOSE, UA: NEGATIVE mg/dL
Hgb urine dipstick: NEGATIVE
Ketones, ur: NEGATIVE mg/dL
Leukocytes, UA: NEGATIVE
Nitrite: NEGATIVE
PH: 5.5 (ref 5.0–8.0)
Protein, ur: NEGATIVE mg/dL
SPECIFIC GRAVITY, URINE: 1.016 (ref 1.005–1.030)
Urobilinogen, UA: 0.2 mg/dL (ref 0.0–1.0)

## 2013-08-03 LAB — URINE CULTURE: Colony Count: 50000

## 2013-08-05 ENCOUNTER — Other Ambulatory Visit: Payer: Self-pay | Admitting: Family

## 2013-08-05 DIAGNOSIS — E871 Hypo-osmolality and hyponatremia: Secondary | ICD-10-CM

## 2013-08-05 MED ORDER — CIPROFLOXACIN HCL 250 MG PO TABS: 250.0000 mg | ORAL_TABLET | Freq: Two times a day (BID) | ORAL | Status: DC

## 2013-08-05 NOTE — Telephone Encounter (Signed)
Patient notified. Patient stated that she will come to do lab.

## 2013-08-05 NOTE — Telephone Encounter (Signed)
Sodium is low.  I would like for her to return for the tests pended below. Urine grew some bacteria. Will rx with abx x 3 days.  Pended below.

## 2013-08-10 ENCOUNTER — Other Ambulatory Visit: Payer: Self-pay | Admitting: Family

## 2013-08-10 DIAGNOSIS — I10 Essential (primary) hypertension: Secondary | ICD-10-CM | POA: Diagnosis not present

## 2013-08-10 DIAGNOSIS — N390 Urinary tract infection, site not specified: Secondary | ICD-10-CM | POA: Diagnosis not present

## 2013-08-10 LAB — BASIC METABOLIC PANEL
BUN: 16 mg/dL (ref 6–23)
CALCIUM: 9.5 mg/dL (ref 8.4–10.5)
CHLORIDE: 97 meq/L (ref 96–112)
CO2: 29 meq/L (ref 19–32)
CREATININE: 0.89 mg/dL (ref 0.50–1.10)
Glucose, Bld: 143 mg/dL — ABNORMAL HIGH (ref 70–99)
Potassium: 4.6 mEq/L (ref 3.5–5.3)
SODIUM: 134 meq/L — AB (ref 135–145)

## 2013-08-11 LAB — URINALYSIS, ROUTINE W REFLEX MICROSCOPIC
BILIRUBIN URINE: NEGATIVE
Glucose, UA: NEGATIVE mg/dL
Hgb urine dipstick: NEGATIVE
KETONES UR: NEGATIVE mg/dL
Leukocytes, UA: NEGATIVE
NITRITE: NEGATIVE
Protein, ur: NEGATIVE mg/dL
SPECIFIC GRAVITY, URINE: 1.014 (ref 1.005–1.030)
UROBILINOGEN UA: 0.2 mg/dL (ref 0.0–1.0)
pH: 7 (ref 5.0–8.0)

## 2013-08-15 ENCOUNTER — Encounter: Payer: Self-pay | Admitting: Family

## 2013-08-15 ENCOUNTER — Telehealth: Payer: Self-pay | Admitting: *Deleted

## 2013-08-15 DIAGNOSIS — N390 Urinary tract infection, site not specified: Secondary | ICD-10-CM

## 2013-08-15 NOTE — Telephone Encounter (Signed)
Received call from pt wanting to verify is she is supposed to be taking losartan potassium. Last time she filled it at the pharmacy was 06/2013. Per review of 06/29/13 phone note, pt was to increase losartan to 50mg  and stop HCTZ and Kdur. Pt states she did stop those medications and will make sure that she continues Losartan 50mg . Pt also requested urine results. Notified pt per 08/15/13 lab letter.   Pt states she continues to have an odor to her urine and she is concerned there may still be an infection and wonders if we should do a urine culture? Pt reports previous history of septicemia due to a urinary tract infection and she is worried. Reports that she doesn't think she has been running a fever and has no other symptoms.  Please advise.

## 2013-08-15 NOTE — Telephone Encounter (Signed)
Please have pt return to lab for repeat urine culture. Dx UTI

## 2013-08-15 NOTE — Telephone Encounter (Signed)
Notified pt and she will return tomorrow to give urine culture. Lab order entered.

## 2013-08-17 DIAGNOSIS — N390 Urinary tract infection, site not specified: Secondary | ICD-10-CM | POA: Diagnosis not present

## 2013-08-17 DIAGNOSIS — M545 Low back pain, unspecified: Secondary | ICD-10-CM | POA: Diagnosis not present

## 2013-08-17 DIAGNOSIS — M47817 Spondylosis without myelopathy or radiculopathy, lumbosacral region: Secondary | ICD-10-CM | POA: Diagnosis not present

## 2013-08-17 DIAGNOSIS — IMO0002 Reserved for concepts with insufficient information to code with codable children: Secondary | ICD-10-CM | POA: Diagnosis not present

## 2013-08-18 LAB — URINALYSIS, ROUTINE W REFLEX MICROSCOPIC
Bilirubin Urine: NEGATIVE
GLUCOSE, UA: NEGATIVE mg/dL
HGB URINE DIPSTICK: NEGATIVE
Ketones, ur: NEGATIVE mg/dL
Leukocytes, UA: NEGATIVE
Nitrite: NEGATIVE
PH: 6 (ref 5.0–8.0)
PROTEIN: NEGATIVE mg/dL
Specific Gravity, Urine: 1.013 (ref 1.005–1.030)
Urobilinogen, UA: 0.2 mg/dL (ref 0.0–1.0)

## 2013-08-18 LAB — URINE CULTURE

## 2013-08-19 ENCOUNTER — Telehealth: Payer: Self-pay | Admitting: Family

## 2013-08-19 MED ORDER — CIPROFLOXACIN HCL 250 MG PO TABS: 250.0000 mg | ORAL_TABLET | Freq: Two times a day (BID) | ORAL | Status: DC

## 2013-08-19 NOTE — Telephone Encounter (Signed)
Left detailed message on cell and to call if any questions. 

## 2013-08-19 NOTE — Telephone Encounter (Signed)
Please notify pt that urine grew small amount of bacteria. Rx sent for cipro.

## 2013-08-22 ENCOUNTER — Other Ambulatory Visit: Payer: Self-pay | Admitting: Family

## 2013-09-13 ENCOUNTER — Encounter: Payer: Self-pay | Admitting: Family

## 2013-09-13 ENCOUNTER — Ambulatory Visit (INDEPENDENT_AMBULATORY_CARE_PROVIDER_SITE_OTHER): Payer: Medicare Other | Admitting: Family

## 2013-09-13 VITALS — BP 110/70 | HR 65 | Temp 97.8°F | Resp 16 | Ht 63.5 in | Wt 184.1 lb

## 2013-09-13 DIAGNOSIS — F32A Depression, unspecified: Secondary | ICD-10-CM

## 2013-09-13 DIAGNOSIS — N390 Urinary tract infection, site not specified: Secondary | ICD-10-CM

## 2013-09-13 DIAGNOSIS — F329 Major depressive disorder, single episode, unspecified: Secondary | ICD-10-CM

## 2013-09-13 DIAGNOSIS — F3289 Other specified depressive episodes: Secondary | ICD-10-CM | POA: Diagnosis not present

## 2013-09-13 DIAGNOSIS — R110 Nausea: Secondary | ICD-10-CM | POA: Diagnosis not present

## 2013-09-13 DIAGNOSIS — R82998 Other abnormal findings in urine: Secondary | ICD-10-CM | POA: Diagnosis not present

## 2013-09-13 DIAGNOSIS — R8290 Unspecified abnormal findings in urine: Secondary | ICD-10-CM

## 2013-09-13 LAB — LIPASE: LIPASE: 41 U/L (ref 0–75)

## 2013-09-13 LAB — HEPATIC FUNCTION PANEL
ALBUMIN: 4.3 g/dL (ref 3.5–5.2)
ALK PHOS: 43 U/L (ref 39–117)
ALT: 20 U/L (ref 0–35)
AST: 26 U/L (ref 0–37)
BILIRUBIN TOTAL: 0.8 mg/dL (ref 0.2–1.2)
Bilirubin, Direct: 0.2 mg/dL (ref 0.0–0.3)
Indirect Bilirubin: 0.6 mg/dL (ref 0.2–1.2)
TOTAL PROTEIN: 7 g/dL (ref 6.0–8.3)

## 2013-09-13 LAB — POCT URINALYSIS DIPSTICK
Blood, UA: NEGATIVE
Glucose, UA: NEGATIVE
KETONES UA: NEGATIVE
Leukocytes, UA: NEGATIVE
Nitrite, UA: NEGATIVE
SPEC GRAV UA: 1.01
UROBILINOGEN UA: 0.2
pH, UA: 7.5

## 2013-09-13 MED ORDER — ZOLPIDEM TARTRATE 10 MG PO TABS: ORAL_TABLET | ORAL | Status: DC

## 2013-09-13 NOTE — Assessment & Plan Note (Signed)
Improved on current dose of lexapro.  Continue same.  

## 2013-09-13 NOTE — Patient Instructions (Signed)
Please complete lab work prior to leaving. You will be contacted about scheduling your abdominal ultrasound. Follow up in 1 month.

## 2013-09-13 NOTE — Assessment & Plan Note (Signed)
Obtain lipase, lft, abd [place] to evaluate gallbladder.  We discussed that it is possible that lexapro is contributing to nausea, but she feels symptoms preceded initiation of lexapro.

## 2013-09-13 NOTE — Progress Notes (Signed)
Subjective:    Patient ID: Kimberly Lin, female    DOB: 09-Sep-1945, 68 y.o.   MRN: [id number]  HPI  Ms. Caban is a 68 yr old female who presents today for follow up of depression. Last visit she noted depression not to be optimally controlled.  Her lexapro dose was increased form 10mg  to 20mg .  She notes that depression symptoms have improved.  Complains of insomnia- ran out of [place] 4 days ago. Has been trying not to take xanax due to recent report potentially liking dementia to benzos..  Reports that she wakes up in pain "aches all over."  Has cramping in her legs. She is using meloxicam. Feels like she can't sleep due to pain.  She is following with Dr. Maryjean Ka for pain management.   Nausea- She does report some nausea.  Comes in waves.has been present for several months. Denies upper abdominal pain, denies vomitting.    Some intermittent right lower abdominal pain in the are where she had remote cyst removal.  She is s/p appendectomy. She tells me she plans to follow up with GYN for this.  Urinary odor- would like urine rechecked. Denies dysuria or frequency.   Review of Systems See HPI  Past Medical History  Diagnosis Date  . Arthritis     osteoarthritis  . Fibromyalgia   . History of chicken pox   . Depression   . Diabetes mellitus   . GERD (gastroesophageal reflux disease)   . Hyperlipidemia   . Hypertension   . Migraine   . Personal history of colonic polyps   . History of septic shock 12/2009  . Spinal stenosis   . OSA (obstructive sleep apnea) 03/12/2011  . Hiatal hernia   . Abdominal mass   . Anxiety   . Colitis 2014    History   Social History  . Marital Status: Married    Spouse Name: N/A    Number of Children: 2  . Years of Education: N/A   Occupational History  . Not on file.   Social History Main Topics  . Smoking status: Former Smoker    Types: Cigarettes  . Smokeless tobacco: Never Used     Comment: Smoked socially.  . Alcohol Use: 3.6 -  4.8 oz/week    6-8 Glasses of wine per week  . Drug Use: No  . Sexual Activity: Not on file   Other Topics Concern  . Not on file   Social History Narrative   Regular exercise:  No   Caffeine use: 1 cup daily          Past Surgical History  Procedure Laterality Date  . Breast biopsy Right 2007, 2009  . Appendectomy  1983  . Joint replacement Bilateral 2006    bilateral partial knee replacement  . Ovarian cyst removal  2008    Pt states she had a ?cyst removed ?ovaries  . Polypectomy      Family History  Problem Relation Age of Onset  . Arthritis Mother   . Hyperlipidemia Mother   . Hypertension Mother   . Diabetes Mother   . Heart disease Mother   . Lung cancer Brother     lung, prostate  . Heart disease Brother     x 2  . Hypertension Brother   . Hyperlipidemia Brother   . Diabetes Brother   . Sudden death Father   . Diabetes Sister   . Hyperlipidemia Sister   . Hypertension Sister   .  Diabetes Maternal Aunt   . Heart attack Maternal Aunt   . Hyperlipidemia Maternal Aunt   . Hypertension Maternal Aunt   . Hypertension Maternal Uncle   . Hyperlipidemia Maternal Uncle   . Heart attack Maternal Uncle   . Diabetes Maternal Uncle   . Diabetes Paternal Aunt   . Heart attack Paternal Aunt   . Hyperlipidemia Paternal Aunt   . Hypertension Paternal Aunt   . Hypertension Paternal Uncle   . Hyperlipidemia Paternal Uncle   . Heart attack Paternal Uncle   . Diabetes Paternal Uncle   . Breast cancer Mother   . Lung cancer Mother   . Rectal cancer Father     Allergies  Allergen Reactions  . Prednisone Other (See Comments)    "Insomnia"    Current Outpatient Prescriptions on File Prior to Visit  Medication Sig Dispense Refill  . ALPRAZolam (XANAX) 1 MG tablet TAKE ONE TABLET BY MOUTH TWICE DAILY AS NEEDED  180 tablet  0  . amLODipine (NORVASC) 5 MG tablet TAKE ONE TABLET BY MOUTH ONCE DAILY  30 tablet  0  . aspirin 81 MG tablet Take 81 mg by mouth daily.         Marland Kitchen atorvastatin (LIPITOR) 20 MG tablet TAKE ONE TABLET BY MOUTH ONCE DAILY  90 tablet  0  . Calcium Carbonate-Vitamin D (CALCIUM-D) 600-400 MG-UNIT TABS Take 1 tablet by mouth daily.       . Cholecalciferol (D-3-5) 5000 UNITS capsule Take 5,000 Units by mouth daily.        Marland Kitchen co-enzyme Q-10 30 MG capsule Take 100 mg by mouth daily.        . diphenhydrAMINE (BENADRYL) 25 MG tablet Take 25 mg by mouth every 6 (six) hours as needed.      . diphenoxylate-atropine (LOMOTIL) 2.5-0.025 MG per tablet Take 1 tablet by mouth 4 (four) times daily as needed for diarrhea or loose stools.  30 tablet  0  . escitalopram (LEXAPRO) 20 MG tablet Take 1 tablet (20 mg total) by mouth daily.  30 tablet  2  . glucose blood (ACCU-CHEK AVIVA) test strip Use as instructed to check blood sugar 1-2 times daily.  Dx 250.00.  100 each  3  . Lancets (ACCU-CHEK MULTICLIX) lancets Use as instructed to check blood sugar two times daily.  Dx 250.00  100 each  2  . losartan (COZAAR) 50 MG tablet Take 1 tablet (50 mg total) by mouth daily.  30 tablet  3  . mesalamine (LIALDA) 1.2 G EC tablet Take 2 tablets (2.4 g total) by mouth daily with breakfast.  48 tablet  0  . metFORMIN (GLUCOPHAGE) 500 MG tablet TAKE TWO TABLETS BY MOUTH TWICE DAILY WITH MEALS  120 tablet  5  . metoprolol (LOPRESSOR) 50 MG tablet TAKE ONE-HALF TABLET BY MOUTH TWICE DAILY  30 tablet  5  . Multiple Vitamin (MULTIVITAMIN) tablet Take 1 tablet by mouth daily.        Marland Kitchen nystatin-triamcinolone ointment (MYCOLOG) Apply 1 application topically 2 (two) times daily.  60 g  0  . Omega-3 Fatty Acids (FISH OIL) 1000 MG CAPS 2 caps by mouth twice daily    0  . pantoprazole (PROTONIX) 40 MG tablet TAKE ONE TABLET BY MOUTH ONCE DAILY  30 tablet  3  . sitaGLIPtin (JANUVIA) 100 MG tablet Take 1 tablet (100 mg total) by mouth daily.  30 tablet  2  . terbinafine (LAMISIL AT) 1 % cream Apply topically 2 (two)  times daily.  30 g  0  . valACYclovir (VALTREX) 500 MG tablet Take  500 mg by mouth daily.      Marland Kitchen venlafaxine XR (EFFEXOR-XR) 75 MG 24 hr capsule TAKE TWO CAPSULES BY MOUTH ONCE DAILY  60 capsule  3  . zolpidem (AMBIEN) 10 MG tablet TAKE ONE TABLET BY MOUTH AT BEDTIME AS NEEDED FOR SLEEP  90 tablet  0   No current facility-administered medications on file prior to visit.    BP 110/70  Pulse 65  Temp(Src) 97.8 F (36.6 C) (Oral)  Resp 16  Ht 5' 3.5" (1.613 m)  Wt 184 lb 1.9 oz (83.516 kg)  BMI 32.10 kg/m2  SpO2 98%       Objective:   Physical Exam  Constitutional: She is oriented to person, place, and time. She appears well-developed and well-nourished. No distress.  HENT:  Head: Normocephalic and atraumatic.  Cardiovascular: Normal rate and regular rhythm.   No murmur heard. Pulmonary/Chest: Effort normal and breath sounds normal. No respiratory distress. She has no wheezes. She has no rales. She exhibits no tenderness.  Abdominal: Soft.  Musculoskeletal: She exhibits no edema.  Neurological: She is alert and oriented to person, place, and time.  Skin: Skin is warm and dry.  Psychiatric: She has a normal mood and affect. Her behavior is normal. Judgment and thought content normal.          Assessment & Plan:

## 2013-09-13 NOTE — Progress Notes (Signed)
Pre visit review using our clinic review tool, if applicable. No additional management support is needed unless otherwise documented below in the visit note. 

## 2013-09-13 NOTE — Assessment & Plan Note (Signed)
UA not indicative of uti. Will send urine for culture.

## 2013-09-14 ENCOUNTER — Encounter: Payer: Self-pay | Admitting: Family

## 2013-09-14 LAB — URINE CULTURE
Colony Count: NO GROWTH
Organism ID, Bacteria: NO GROWTH

## 2013-09-15 ENCOUNTER — Ambulatory Visit (HOSPITAL_BASED_OUTPATIENT_CLINIC_OR_DEPARTMENT_OTHER)
Admission: RE | Admit: 2013-09-15 | Discharge: 2013-09-15 | Disposition: A | Discharge status: Home / Self Care | Payer: Medicare Other | Source: Ambulatory Visit | Attending: Family | Admitting: Family

## 2013-09-15 DIAGNOSIS — K7689 Other specified diseases of liver: Secondary | ICD-10-CM | POA: Diagnosis not present

## 2013-09-15 DIAGNOSIS — R109 Unspecified abdominal pain: Secondary | ICD-10-CM | POA: Diagnosis not present

## 2013-09-15 DIAGNOSIS — K769 Liver disease, unspecified: Secondary | ICD-10-CM | POA: Insufficient documentation

## 2013-09-15 DIAGNOSIS — R110 Nausea: Secondary | ICD-10-CM | POA: Insufficient documentation

## 2013-09-15 DIAGNOSIS — G8929 Other chronic pain: Secondary | ICD-10-CM | POA: Diagnosis not present

## 2013-09-15 DIAGNOSIS — E119 Type 2 diabetes mellitus without complications: Secondary | ICD-10-CM | POA: Diagnosis not present

## 2013-09-16 ENCOUNTER — Telehealth: Payer: Self-pay | Admitting: *Deleted

## 2013-09-16 DIAGNOSIS — R110 Nausea: Secondary | ICD-10-CM

## 2013-09-16 MED ORDER — ONDANSETRON HCL 4 MG PO TABS: 4.0000 mg | ORAL_TABLET | Freq: Three times a day (TID) | ORAL | Status: DC | PRN

## 2013-09-16 NOTE — Telephone Encounter (Signed)
Pt left message that she is still having a lot of nausea and would like Rx to help.  Please advise.

## 2013-09-16 NOTE — Telephone Encounter (Signed)
Pt notfied, and referral for gi signed, awaiting approval

## 2013-09-16 NOTE — Telephone Encounter (Signed)
Rx sent for zofran prn. I would also like for her to meet with GI for further evaluation and I have pended order below.

## 2013-09-20 ENCOUNTER — Encounter: Payer: Self-pay | Admitting: Family

## 2013-09-21 ENCOUNTER — Other Ambulatory Visit: Payer: Self-pay | Admitting: Family

## 2013-09-27 ENCOUNTER — Telehealth: Payer: Self-pay

## 2013-09-27 NOTE — Telephone Encounter (Signed)
Pt stated that "she's been in chronic pain for over 1 week. Its gotten worse. Legs are cramping, arms and shoulders hurting, neck and back pain. She states that she has Osteoarthritis, spinal stenosis, fibromyalgia. She does get injections in her spine. She cant get anymore injections from other physician until 9/9. She wants to know if she needs to come in or see another dr. For her shoulders. She states she is in tears most of the time." LDM

## 2013-09-27 NOTE — Telephone Encounter (Signed)
Answering for Kimberly Lin who is out of office this week.  I am sorry the patient is experiencing such levels of pain.  I recommend that she speak with her Neurosurgeon about other options to manage pain.  We can always evaluate her in clinic and help with pain management, but she will likely need a referral to pain specialist for severe chronic pain.  Kimberly Lin will be in clinic next week.  I am always happy to see her in clinic if I have an appointment available.

## 2013-09-28 DIAGNOSIS — N949 Unspecified condition associated with female genital organs and menstrual cycle: Secondary | ICD-10-CM | POA: Diagnosis not present

## 2013-09-28 NOTE — Telephone Encounter (Signed)
Spoke with pt, she's aware. Will call back an make an appt. LDM

## 2013-09-29 ENCOUNTER — Other Ambulatory Visit: Payer: Self-pay | Admitting: Family

## 2013-09-29 NOTE — Telephone Encounter (Signed)
Rx sent to pharmacy. LDM 

## 2013-10-04 DIAGNOSIS — M545 Low back pain, unspecified: Secondary | ICD-10-CM | POA: Diagnosis not present

## 2013-10-04 DIAGNOSIS — IMO0002 Reserved for concepts with insufficient information to code with codable children: Secondary | ICD-10-CM | POA: Diagnosis not present

## 2013-10-04 DIAGNOSIS — M47817 Spondylosis without myelopathy or radiculopathy, lumbosacral region: Secondary | ICD-10-CM | POA: Diagnosis not present

## 2013-10-04 DIAGNOSIS — Z6833 Body mass index (BMI) 33.0-33.9, adult: Secondary | ICD-10-CM | POA: Diagnosis not present

## 2013-10-14 ENCOUNTER — Other Ambulatory Visit: Payer: Self-pay | Admitting: Family Medicine

## 2013-10-14 ENCOUNTER — Other Ambulatory Visit: Payer: Self-pay | Admitting: Family

## 2013-10-14 NOTE — Telephone Encounter (Signed)
Rx faxed to pharmacy.  Rx printed and forwarded to PRovider for signature.  Medication name:  Name from pharmacy:  ALPRAZolam (XANAX) 1 MG tablet  ALPRAZolam 1MG  TAB Sig: TAKE ONE TABLET BY MOUTH TWICE DAILY AS NEEDED Dispense: 180 tablet Refills: 0 Start: 10/14/2013 Class: Normal Requested on: 06/20/2013 Originally ordered on: 11/27/2010 Last refill: 06/20/2013

## 2013-10-14 NOTE — Telephone Encounter (Signed)
Rx printed and forwarded to Provider for signature.  Medication name:  Name from pharmacy:  zolpidem (AMBIEN) 10 MG tablet  ZOLPIDEM 10MG  TAB Sig: TAKE ONE TABLET BY MOUTH AT BEDTIME AS NEEDED FOR SLEEP Dispense: 30 tablet Refills: 0 Start: 10/14/2013 Class: Normal Requested on: 09/13/2013 Originally ordered on: 03/18/2012 Last refill: 09/17/2013

## 2013-10-14 NOTE — Telephone Encounter (Signed)
Rx faxed to pharmacy  

## 2013-10-17 ENCOUNTER — Other Ambulatory Visit: Payer: Self-pay | Admitting: Family Medicine

## 2013-10-18 ENCOUNTER — Ambulatory Visit: Payer: Medicare Other | Admitting: Family

## 2013-10-18 NOTE — Telephone Encounter (Signed)
RX was faxed on 10-14-13 quantity 60 with 0 refills

## 2013-10-21 ENCOUNTER — Other Ambulatory Visit: Payer: Self-pay | Admitting: Family

## 2013-10-24 DIAGNOSIS — IMO0002 Reserved for concepts with insufficient information to code with codable children: Secondary | ICD-10-CM | POA: Diagnosis not present

## 2013-10-24 DIAGNOSIS — M545 Low back pain, unspecified: Secondary | ICD-10-CM | POA: Diagnosis not present

## 2013-10-24 DIAGNOSIS — M47817 Spondylosis without myelopathy or radiculopathy, lumbosacral region: Secondary | ICD-10-CM | POA: Diagnosis not present

## 2013-10-29 ENCOUNTER — Other Ambulatory Visit: Payer: Self-pay | Admitting: Family

## 2013-10-31 NOTE — Telephone Encounter (Signed)
Rx request to pharmacy/[facility]  

## 2013-11-01 ENCOUNTER — Telehealth: Payer: Self-pay | Admitting: Gastroenterology

## 2013-11-01 NOTE — Telephone Encounter (Signed)
Spoke with patient and once a week, she is having incontinence of stool. She is unaware of having the stool. She is aware that Dr. Sharlett Iles has retired and prefers Dr. Henrene Pastor. Scheduled with Tye Savoy, NP on 11/09/13 at 1:30 PM.

## 2013-11-02 ENCOUNTER — Telehealth: Payer: Self-pay | Admitting: Family

## 2013-11-02 NOTE — Telephone Encounter (Signed)
Caller name:Armbrister Roneshia Relation to MQ:KMMN Call back [phone] Pharmacy:[facility]  Reason for call: pt states her insurance charging double for the rx JANUVIA 100 MG tablet, pt states she can not afford it, would like something else called in for her.

## 2013-11-04 ENCOUNTER — Telehealth: Payer: Self-pay | Admitting: *Deleted

## 2013-11-04 ENCOUNTER — Ambulatory Visit: Payer: Medicare Other | Admitting: Physician Assistant

## 2013-11-04 MED ORDER — GLIMEPIRIDE 1 MG PO TABS: 1.0000 mg | ORAL_TABLET | Freq: Every day | ORAL | Status: DC

## 2013-11-04 NOTE — Telephone Encounter (Signed)
Pt called and rescheduled appointment 11/04/2013 at 2:15 for left side pain until Monday 11/07/2013 with [name] O'sullivan.

## 2013-11-04 NOTE — Telephone Encounter (Signed)
Do not charge patient. 

## 2013-11-04 NOTE — Telephone Encounter (Signed)
Please Advise

## 2013-11-04 NOTE — Telephone Encounter (Signed)
Notified pt and she voices understanding. 

## 2013-11-04 NOTE — Telephone Encounter (Signed)
Stop januvia, add amaryl. Do not skip meals while on amaryl. rx sent.

## 2013-11-07 ENCOUNTER — Ambulatory Visit: Payer: Medicare Other | Admitting: Family

## 2013-11-08 ENCOUNTER — Ambulatory Visit (INDEPENDENT_AMBULATORY_CARE_PROVIDER_SITE_OTHER): Payer: Medicare Other | Admitting: Family

## 2013-11-08 ENCOUNTER — Encounter: Payer: Self-pay | Admitting: Family

## 2013-11-08 VITALS — BP 154/92 | HR 73 | Temp 98.1°F | Resp 18 | Ht 63.5 in | Wt 194.8 lb

## 2013-11-08 DIAGNOSIS — IMO0001 Reserved for inherently not codable concepts without codable children: Secondary | ICD-10-CM

## 2013-11-08 DIAGNOSIS — Z23 Encounter for immunization: Secondary | ICD-10-CM | POA: Diagnosis not present

## 2013-11-08 DIAGNOSIS — M7918 Myalgia, other site: Secondary | ICD-10-CM

## 2013-11-08 DIAGNOSIS — M25519 Pain in unspecified shoulder: Secondary | ICD-10-CM | POA: Diagnosis not present

## 2013-11-08 DIAGNOSIS — M25512 Pain in left shoulder: Secondary | ICD-10-CM

## 2013-11-08 MED ORDER — LIDOCAINE 5 % EX PTCH: 1.0000 | MEDICATED_PATCH | CUTANEOUS | Status: DC

## 2013-11-08 NOTE — Assessment & Plan Note (Signed)
Trial of lidoderm patch to affected area chest wall.  Suspect fibromyalgia is contributing to her symptoms.

## 2013-11-08 NOTE — Progress Notes (Signed)
Subjective:    Patient ID: Kimberly Lin, female    DOB: 12/03/1945, 68 y.o.   MRN: [id number]  HPI  Kimberly Lin is a 68 yr old female who presents today with two concerns:  1) L shoulder pain- started suddenly last night.  Pain is worse with movement.  Had trouble sleeping last night as a result.  2) L side pain- has been present for some time, no known injury. Denies dysuria, frequency, hematuria or fever.     Review of Systems See HPI  Past Medical History  Diagnosis Date  . Arthritis     osteoarthritis  . Fibromyalgia   . History of chicken pox   . Depression   . Diabetes mellitus   . GERD (gastroesophageal reflux disease)   . Hyperlipidemia   . Hypertension   . Migraine   . Personal history of colonic polyps   . History of septic shock 12/2009  . Spinal stenosis   . OSA (obstructive sleep apnea) 03/12/2011  . Hiatal hernia   . Abdominal mass   . Anxiety   . Colitis 2014    History   Social History  . Marital Status: Married    Spouse Name: N/A    Number of Children: 2  . Years of Education: N/A   Occupational History  . Not on file.   Social History Main Topics  . Smoking status: Former Smoker    Types: Cigarettes  . Smokeless tobacco: Never Used     Comment: Smoked socially.  . Alcohol Use: 3.6 - 4.8 oz/week    6-8 Glasses of wine per week  . Drug Use: No  . Sexual Activity: Not on file   Other Topics Concern  . Not on file   Social History Narrative   Regular exercise:  No   Caffeine use: 1 cup daily          Past Surgical History  Procedure Laterality Date  . Breast biopsy Right 2007, 2009  . Appendectomy  1983  . Joint replacement Bilateral 2006    bilateral partial knee replacement  . Ovarian cyst removal  2008    Pt states she had a ?cyst removed ?ovaries  . Polypectomy      Family History  Problem Relation Age of Onset  . Arthritis Mother   . Hyperlipidemia Mother   . Hypertension Mother   . Diabetes Mother   .  Heart disease Mother   . Lung cancer Brother     lung, prostate  . Heart disease Brother     x 2  . Hypertension Brother   . Hyperlipidemia Brother   . Diabetes Brother   . Sudden death Father   . Diabetes Sister   . Hyperlipidemia Sister   . Hypertension Sister   . Diabetes Maternal Aunt   . Heart attack Maternal Aunt   . Hyperlipidemia Maternal Aunt   . Hypertension Maternal Aunt   . Hypertension Maternal Uncle   . Hyperlipidemia Maternal Uncle   . Heart attack Maternal Uncle   . Diabetes Maternal Uncle   . Diabetes Paternal Aunt   . Heart attack Paternal Aunt   . Hyperlipidemia Paternal Aunt   . Hypertension Paternal Aunt   . Hypertension Paternal Uncle   . Hyperlipidemia Paternal Uncle   . Heart attack Paternal Uncle   . Diabetes Paternal Uncle   . Breast cancer Mother   . Lung cancer Mother   . Rectal cancer Father  Allergies  Allergen Reactions  . Prednisone Other (See Comments)    "Insomnia"    Current Outpatient Prescriptions on File Prior to Visit  Medication Sig Dispense Refill  . ALPRAZolam (XANAX) 1 MG tablet TAKE ONE TABLET BY MOUTH TWICE DAILY AS NEEDED  60 tablet  0  . amLODipine (NORVASC) 5 MG tablet TAKE ONE TABLET BY MOUTH ONCE DAILY  30 tablet  3  . aspirin 81 MG tablet Take 81 mg by mouth daily.        Marland Kitchen atorvastatin (LIPITOR) 20 MG tablet TAKE ONE TABLET BY MOUTH ONCE DAILY  90 tablet  0  . Calcium Carbonate-Vitamin D (CALCIUM-D) 600-400 MG-UNIT TABS Take 1 tablet by mouth daily.       . Cholecalciferol (D-3-5) 5000 UNITS capsule Take 5,000 Units by mouth daily.        Marland Kitchen co-enzyme Q-10 30 MG capsule Take 100 mg by mouth daily.        . diphenhydrAMINE (BENADRYL) 25 MG tablet Take 25 mg by mouth every 6 (six) hours as needed.      Marland Kitchen escitalopram (LEXAPRO) 20 MG tablet Take 1 tablet (20 mg total) by mouth daily.  30 tablet  2  . glimepiride (AMARYL) 1 MG tablet Take 1 tablet (1 mg total) by mouth daily with breakfast.  30 tablet  2  . glucose  blood (ACCU-CHEK AVIVA) test strip Use as instructed to check blood sugar 1-2 times daily.  Dx 250.00.  100 each  3  . Lancets (ACCU-CHEK MULTICLIX) lancets Use as instructed to check blood sugar two times daily.  Dx 250.00  100 each  2  . losartan (COZAAR) 50 MG tablet Take 1 tablet (50 mg total) by mouth daily.  30 tablet  3  . metFORMIN (GLUCOPHAGE) 500 MG tablet TAKE TWO TABLETS BY MOUTH TWICE DAILY WITH MEALS  120 tablet  5  . metoprolol (LOPRESSOR) 50 MG tablet TAKE ONE-HALF TABLET BY MOUTH TWICE DAILY  30 tablet  5  . Multiple Vitamin (MULTIVITAMIN) tablet Take 1 tablet by mouth daily.        Marland Kitchen nystatin-triamcinolone ointment (MYCOLOG) Apply 1 application topically 2 (two) times daily.  60 g  0  . Omega-3 Fatty Acids (FISH OIL) 1000 MG CAPS 2 caps by mouth twice daily    0  . pantoprazole (PROTONIX) 40 MG tablet TAKE ONE TABLET BY MOUTH ONCE DAILY  30 tablet  3  . valACYclovir (VALTREX) 500 MG tablet Take 500 mg by mouth daily.      Marland Kitchen venlafaxine XR (EFFEXOR-XR) 75 MG 24 hr capsule TAKE TWO CAPSULES BY MOUTH ONCE DAILY  60 capsule  3  . zolpidem (AMBIEN) 10 MG tablet TAKE ONE TABLET BY MOUTH AT BEDTIME AS NEEDED FOR SLEEP  30 tablet  0   No current facility-administered medications on file prior to visit.    BP 154/92  Pulse 73  Temp(Src) 98.1 F (36.7 C) (Oral)  Resp 18  Ht 5' 3.5" (1.613 m)  Wt 194 lb 12.8 oz (88.361 kg)  BMI 33.96 kg/m2  SpO2 98%       Objective:   Physical Exam  Constitutional: She appears well-developed and well-nourished. No distress.  Cardiovascular: Normal rate and regular rhythm.   No murmur heard. Pulmonary/Chest: Effort normal and breath sounds normal. No respiratory distress. She has no wheezes. She has no rales. She exhibits no tenderness.  Abdominal: Soft. Bowel sounds are normal.  Musculoskeletal:  Tenderness to palpation overlying left lateral  rib cage and left lateral breast.  No masses, no ecchymosis.  Left shoulder is tender to  palpation.  Increased pain with empty can.            Assessment & Plan:

## 2013-11-08 NOTE — Progress Notes (Signed)
Pre visit review using our clinic review tool, if applicable. No additional management support is needed unless otherwise documented below in the visit note. 

## 2013-11-08 NOTE — Assessment & Plan Note (Signed)
Recommend that she continue meloxicam 7.5mg  once daily- she has rx at home.  Will also arrange consult with Dr. Barbaraann Barthel.

## 2013-11-09 ENCOUNTER — Encounter: Payer: Self-pay | Admitting: Nurse Practitioner

## 2013-11-09 ENCOUNTER — Ambulatory Visit (INDEPENDENT_AMBULATORY_CARE_PROVIDER_SITE_OTHER): Payer: Medicare Other | Admitting: Nurse Practitioner

## 2013-11-09 VITALS — BP 142/80 | HR 88 | Ht 63.5 in | Wt 194.8 lb

## 2013-11-09 DIAGNOSIS — R159 Full incontinence of feces: Secondary | ICD-10-CM

## 2013-11-09 NOTE — Patient Instructions (Signed)
Please purchase Imodium and use as needed.  Please follow up as needed or if symptoms become worse.

## 2013-11-09 NOTE — Progress Notes (Signed)
     History of Present Illness:   Patient is a 68 year old female, former patient of Dr. Buel Ream who diagnosed her with collagenous colitis December 2014. Budesonide was too costly, we ultimately prescribed Lialda and she improved after a course of treatment. Bowel movements are normal except for diarrhea about once a week for which she takes Imodium   Patient comes in today for evaluation of fecal incontinence. Over the last few weeks she has been having intermittent involuntary discharge of stool. No urinary incontinence. No lower extremity weakness or other neurological signs or symptoms.   Current Medications, Allergies, Past Medical History, Past Surgical History, Family History and Social History were reviewed in [employer] record.  Physical Exam: General: Pleasant, well developed , white female in no acute distress Head: Normocephalic and atraumatic Eyes:  sclerae anicteric, conjunctiva pink  Ears: Normal auditory acuity Lungs: Clear throughout to auscultation Heart: Regular rate and rhythm Abdomen: Soft, non distended, non-tender. No masses, no hepatomegaly. Normal bowel sounds Rectal: anus slightly patulous, decreased anal sphincter tone. No stool in vault Musculoskeletal: Symmetrical with no gross deformities  Extremities: No edema  Neurological: Alert oriented x 4, grossly nonfocal Psychological:  Alert and cooperative. Normal mood and affect  Assessment and Recommendations:  #1. Pleasant 68 year old female with history of collagenous colitis December 2014 which responded to a course of mesalamine. Bowels pretty much normal except for diarrhea about once a week for which she uses Imodium.  #2. Passive fecal incontinence over the last 2 weeks. Patient has not been constipated, doubt she is having overflow. On exam anus slightly patulous and there is decreased sphincter tone. Suspect her passive incontinence is from inability to retain liquid stool  in setting of decreased sphinter tone. Recommend Imodium as needed. She will let us know if this doesn't help.

## 2013-11-10 ENCOUNTER — Ambulatory Visit (INDEPENDENT_AMBULATORY_CARE_PROVIDER_SITE_OTHER): Payer: Medicare Other | Admitting: Family Medicine

## 2013-11-10 ENCOUNTER — Encounter: Payer: Self-pay | Admitting: Family Medicine

## 2013-11-10 ENCOUNTER — Encounter (INDEPENDENT_AMBULATORY_CARE_PROVIDER_SITE_OTHER): Payer: Self-pay

## 2013-11-10 VITALS — BP 137/87 | HR 75 | Ht 64.0 in | Wt 194.0 lb

## 2013-11-10 DIAGNOSIS — M25511 Pain in right shoulder: Secondary | ICD-10-CM | POA: Diagnosis not present

## 2013-11-10 DIAGNOSIS — M25512 Pain in left shoulder: Secondary | ICD-10-CM | POA: Diagnosis not present

## 2013-11-10 DIAGNOSIS — R159 Full incontinence of feces: Secondary | ICD-10-CM | POA: Insufficient documentation

## 2013-11-10 MED ORDER — NAPROXEN 500 MG PO TABS: 500.0000 mg | ORAL_TABLET | Freq: Two times a day (BID) | ORAL | Status: DC | PRN

## 2013-11-10 NOTE — Patient Instructions (Signed)
You have rotator cuff tendinitis, left AC joint arthritis. Try to avoid painful activities (overhead activities, lifting with extended arm) as much as possible. Naproxen 500mg  twice a day with food for pain and inflammation. Can take tylenol in addition to this. Consider injections, physical therapy, nitro patches if not improving. Do home exercise program with theraband and scapular stabilization exercises daily - these are very important for long term relief even if an injection was given.  3 sets of 10 once a day. Follow up with me in 1 month to 6 weeks.

## 2013-11-11 ENCOUNTER — Encounter: Payer: Self-pay | Admitting: Family Medicine

## 2013-11-11 DIAGNOSIS — M25512 Pain in left shoulder: Secondary | ICD-10-CM | POA: Insufficient documentation

## 2013-11-11 DIAGNOSIS — M25511 Pain in right shoulder: Secondary | ICD-10-CM | POA: Insufficient documentation

## 2013-11-11 NOTE — Progress Notes (Signed)
Agree with initial assessment and plans 

## 2013-11-11 NOTE — Assessment & Plan Note (Signed)
2/2 rotator cuff tendinitis bilaterally but also some left AC arthritis.  Naproxen, home exercise program reviewed to do for next 6 weeks.  Icing as needed.  Consider injections, physical therapy, nitro patches if not improving.  Follow up with me in 1 month to 6 weeks.

## 2013-11-11 NOTE — Progress Notes (Signed)
Patient ID: Kimberly Lin, female   DOB: 1945/06/29, 68 y.o.   MRN: [id number]  PCP: Nance Pear., NP  Subjective:   HPI: Patient is a 68 y.o. female here for bilateral shoulder pain.  Patient was seen 3 years ago for right shoulder pain and completely improved. She states about 1 week ago in the evening her left shoulder 'locked up' and she couldn't sleep on Sunday night. No known injury or increase in activity level. Pain has improved some since then. Now right shoulder starting to bother her again. Pain with reaching, overhead motions. No bruising or swelling.  Past Medical History  Diagnosis Date  . Arthritis     osteoarthritis  . Fibromyalgia   . History of chicken pox   . Depression   . Diabetes mellitus   . GERD (gastroesophageal reflux disease)   . Hyperlipidemia   . Hypertension   . Migraine   . Personal history of colonic polyps   . History of septic shock 12/2009  . Spinal stenosis   . OSA (obstructive sleep apnea) 03/12/2011  . Hiatal hernia   . Abdominal mass   . Anxiety   . Colitis 2014    Current Outpatient Prescriptions on File Prior to Visit  Medication Sig Dispense Refill  . ALPRAZolam (XANAX) 1 MG tablet TAKE ONE TABLET BY MOUTH TWICE DAILY AS NEEDED  60 tablet  0  . amLODipine (NORVASC) 5 MG tablet TAKE ONE TABLET BY MOUTH ONCE DAILY  30 tablet  3  . aspirin 81 MG tablet Take 81 mg by mouth daily.        Marland Kitchen atorvastatin (LIPITOR) 20 MG tablet TAKE ONE TABLET BY MOUTH ONCE DAILY  90 tablet  0  . Calcium Carbonate-Vitamin D (CALCIUM-D) 600-400 MG-UNIT TABS Take 1 tablet by mouth daily.       . Cholecalciferol (D-3-5) 5000 UNITS capsule Take 5,000 Units by mouth daily.        Marland Kitchen co-enzyme Q-10 30 MG capsule Take 100 mg by mouth daily.        . diphenhydrAMINE (BENADRYL) 25 MG tablet Take 25 mg by mouth every 6 (six) hours as needed.      Marland Kitchen escitalopram (LEXAPRO) 20 MG tablet Take 1 tablet (20 mg total) by mouth daily.  30 tablet  2  .  gabapentin (NEURONTIN) 300 MG capsule Take 300 mg by mouth 2 (two) times daily.      Marland Kitchen glimepiride (AMARYL) 1 MG tablet Take 1 tablet (1 mg total) by mouth daily with breakfast.  30 tablet  2  . glucose blood (ACCU-CHEK AVIVA) test strip Use as instructed to check blood sugar 1-2 times daily.  Dx 250.00.  100 each  3  . Lancets (ACCU-CHEK MULTICLIX) lancets Use as instructed to check blood sugar two times daily.  Dx 250.00  100 each  2  . lidocaine (LIDODERM) 5 % Place 1 patch onto the skin daily. Remove & Discard patch within 12 hours or as directed by MD  10 patch  1  . losartan (COZAAR) 50 MG tablet Take 1 tablet (50 mg total) by mouth daily.  30 tablet  3  . meloxicam (MOBIC) 7.5 MG tablet Take 7.5 mg by mouth 2 (two) times daily.      . metFORMIN (GLUCOPHAGE) 500 MG tablet TAKE TWO TABLETS BY MOUTH TWICE DAILY WITH MEALS  120 tablet  5  . metoprolol (LOPRESSOR) 50 MG tablet TAKE ONE-HALF TABLET BY MOUTH TWICE DAILY  30 tablet  5  . Multiple Vitamin (MULTIVITAMIN) tablet Take 1 tablet by mouth daily.        Marland Kitchen nystatin-triamcinolone ointment (MYCOLOG) Apply 1 application topically 2 (two) times daily.  60 g  0  . Omega-3 Fatty Acids (FISH OIL) 1000 MG CAPS 2 caps by mouth twice daily    0  . pantoprazole (PROTONIX) 40 MG tablet TAKE ONE TABLET BY MOUTH ONCE DAILY  30 tablet  3  . venlafaxine XR (EFFEXOR-XR) 75 MG 24 hr capsule TAKE TWO CAPSULES BY MOUTH ONCE DAILY  60 capsule  3  . zolpidem (AMBIEN) 10 MG tablet TAKE ONE TABLET BY MOUTH AT BEDTIME AS NEEDED FOR SLEEP  30 tablet  0   No current facility-administered medications on file prior to visit.    Past Surgical History  Procedure Laterality Date  . Breast biopsy Right 2007, 2009  . Appendectomy  1983  . Medial partial knee replacement Bilateral 2006  . Ovarian cyst removal  2008    Pt states she had a ?cyst removed ?ovaries  . Polypectomy  1998  . Colonoscopy  2014    normal     Allergies  Allergen Reactions  . Prednisone  Other (See Comments)    "Insomnia"    History   Social History  . Marital Status: Married    Spouse Name: N/A    Number of Children: 2  . Years of Education: N/A   Occupational History  . Not on file.   Social History Main Topics  . Smoking status: Former Smoker    Types: Cigarettes  . Smokeless tobacco: Never Used     Comment: Smoked socially.  . Alcohol Use: 3.6 - 4.8 oz/week    6-8 Glasses of wine per week  . Drug Use: No  . Sexual Activity: Not on file   Other Topics Concern  . Not on file   Social History Narrative   Regular exercise:  No   Caffeine use: 1 cup daily          Family History  Problem Relation Age of Onset  . Arthritis Mother   . Hyperlipidemia Mother   . Hypertension Mother   . Diabetes Mother   . Heart disease Mother   . Lung cancer Brother   . Heart disease Brother     x 2  . Hypertension Brother   . Hyperlipidemia Brother   . Diabetes Brother   . Sudden death Father   . Diabetes Sister   . Hyperlipidemia Sister   . Hypertension Sister   . Diabetes Maternal Aunt   . Heart attack Maternal Aunt   . Hyperlipidemia Maternal Aunt   . Hypertension Maternal Aunt   . Hypertension Maternal Uncle   . Hyperlipidemia Maternal Uncle   . Heart attack Maternal Uncle   . Diabetes Maternal Uncle   . Diabetes Paternal Aunt   . Heart attack Paternal Aunt   . Hyperlipidemia Paternal Aunt   . Hypertension Paternal Aunt   . Hypertension Paternal Uncle   . Hyperlipidemia Paternal Uncle   . Heart attack Paternal Uncle   . Diabetes Paternal Uncle   . Breast cancer Mother   . Lung cancer Father   . Rectal cancer Father   . Prostate cancer Brother     BP 137/87  Pulse 75  Ht 5\' 4"  (1.626 m)  Wt 194 lb (87.998 kg)  BMI 33.28 kg/m2  Review of Systems: See HPI above.    Objective:  Physical Exam:  Gen: NAD  Right shoulder: No swelling, ecchymoses.  No gross deformity. No TTP. FROM. Positive Hawkins, Neers. Negative Speeds,  Yergasons. Strength 5/5 with empty can and resisted internal/external rotation.  Pain empty can. Negative apprehension. NV intact distally.  Left shoulder: No swelling, ecchymoses.  No gross deformity. TTP AC joint.  FROM. Positive Hawkins, Neers. Negative Speeds, Yergasons. Strength 5/5 with empty can and resisted internal/external rotation.  Pain empty can. Negative apprehension. NV intact distally.    Assessment & Plan:  1. Bilateral shoulder pain - 2/2 rotator cuff tendinitis bilaterally but also some left AC arthritis.  Naproxen, home exercise program reviewed to do for next 6 weeks.  Icing as needed.  Consider injections, physical therapy, nitro patches if not improving.  Follow up with me in 1 month to 6 weeks.

## 2013-11-14 ENCOUNTER — Other Ambulatory Visit: Payer: Self-pay | Admitting: Family

## 2013-11-14 NOTE — Telephone Encounter (Signed)
eScribe request from [facility] for refill on Zolpidem Last filled - 09.04.15, #30x0 Last AEX - 09.29.15 Please Advise on refills/SLS

## 2013-11-15 ENCOUNTER — Other Ambulatory Visit: Payer: Self-pay | Admitting: Family

## 2013-11-15 NOTE — Telephone Encounter (Signed)
Ok to send 30 tabs zero refills. 

## 2013-11-15 NOTE — Telephone Encounter (Signed)
Refill request for Zolpidem 10 mg Last filled by MD on - 09.04.15, 30x0 Last AEX - 9.29.15 Please Advise on refills/SLS

## 2013-11-15 NOTE — Telephone Encounter (Signed)
Caller name: Aarna  Relation to pt: self  Call back number: [phone]   Reason for call:   Pt requesting a refill zolpidem (AMBIEN) 10 MG tablet

## 2013-11-15 NOTE — Telephone Encounter (Signed)
Ok to send 30 tabs with zero refills.  

## 2013-11-16 NOTE — Telephone Encounter (Signed)
Rx request faxed to pharmacy/SLS  

## 2013-11-22 ENCOUNTER — Other Ambulatory Visit: Payer: Self-pay | Admitting: Family

## 2013-11-23 NOTE — Telephone Encounter (Signed)
Refill faxed to pharmacy.

## 2013-11-23 NOTE — Telephone Encounter (Signed)
Rx printed and forwarded to Provider for signature.  Medication name:  Name from pharmacy:  ALPRAZolam (XANAX) 1 MG tablet  ALPRAZolam 1MG  TAB Sig: TAKE ONE TABLET BY MOUTH TWICE DAILY AS NEEDED Dispense: 60 tablet Refills: 0 Start: 11/22/2013 Class: Normal Requested on: 10/14/2013 Originally ordered on: 11/27/2010 Last refill: 10/18/2013

## 2013-11-29 ENCOUNTER — Other Ambulatory Visit: Payer: Self-pay | Admitting: Family

## 2013-12-02 ENCOUNTER — Telehealth: Payer: Self-pay | Admitting: Family

## 2013-12-02 NOTE — Telephone Encounter (Signed)
Decrease lexapro  to 1/2 tab daily for 1 week, then stop. Follow up in 1 month, call sooner if worsening depression symptoms.

## 2013-12-02 NOTE — Telephone Encounter (Signed)
Notified pt of below instructions and she voices understanding. F/u scheduled for 01/02/14 at 10:45am.

## 2013-12-02 NOTE — Telephone Encounter (Signed)
Caller name: Jerolyn Relation to pt: self Call back number: [phone] Pharmacy:  Reason for call:   Patient says that escitalopram is making her hoarse(this is a side effect, she says). Patient would like to stop taking this medication because she says that she is no longer depressed.

## 2013-12-13 ENCOUNTER — Telehealth: Payer: Self-pay | Admitting: *Deleted

## 2013-12-13 ENCOUNTER — Other Ambulatory Visit: Payer: Self-pay | Admitting: Family

## 2013-12-13 MED ORDER — ZOLPIDEM TARTRATE 10 MG PO TABS: ORAL_TABLET | ORAL | Status: DC

## 2013-12-13 NOTE — Telephone Encounter (Signed)
rx refill - ambien 10mg   Last OV- 11/08/13 Last refilled- 11/16/13 # 30 / 0 rf .  UDS- none.

## 2013-12-13 NOTE — Telephone Encounter (Signed)
Rx called to pharmacy voicemail as below. 

## 2013-12-13 NOTE — Telephone Encounter (Signed)
Ok to send 30 tabs zero refills. 

## 2013-12-15 ENCOUNTER — Ambulatory Visit (INDEPENDENT_AMBULATORY_CARE_PROVIDER_SITE_OTHER): Payer: Medicare Other | Admitting: Family Medicine

## 2013-12-15 ENCOUNTER — Encounter: Payer: Self-pay | Admitting: Family Medicine

## 2013-12-15 VITALS — BP 125/81 | HR 87 | Ht 64.0 in

## 2013-12-15 DIAGNOSIS — M25511 Pain in right shoulder: Secondary | ICD-10-CM | POA: Diagnosis not present

## 2013-12-15 DIAGNOSIS — M25512 Pain in left shoulder: Secondary | ICD-10-CM

## 2013-12-15 MED ORDER — METHYLPREDNISOLONE ACETATE 40 MG/ML IJ SUSP
40.0000 mg | Freq: Once | INTRAMUSCULAR | Status: AC
  Administered 2013-12-15: 40 mg via INTRA_ARTICULAR

## 2013-12-15 MED ORDER — METHYLPREDNISOLONE ACETATE 40 MG/ML IJ SUSP
40.0000 mg | Freq: Once | INTRAMUSCULAR | Status: AC
  Administered 2013-12-15: 20 mg via INTRA_ARTICULAR

## 2013-12-15 NOTE — Patient Instructions (Signed)
You have rotator cuff tendinitis, AC joint arthritis. Try to avoid painful activities (overhead activities, lifting with extended arm) as much as possible. Naproxen 500mg  twice a day with food for pain and inflammation. Can take tylenol in addition to this. Consider physical therapy, nitro patches if not improving (or shot for left shoulder). Do home exercise program with theraband and scapular stabilization exercises daily - these are very important for long term relief even if an injection was given.  3 sets of 10 once a day. Follow up with me in 1 month to 6 weeks.

## 2013-12-19 ENCOUNTER — Other Ambulatory Visit: Payer: Self-pay | Admitting: Family

## 2013-12-19 NOTE — Progress Notes (Signed)
Patient ID: Kimberly Lin, female   DOB: 1945/11/13, 68 y.o.   MRN: [id number]  PCP: Nance Pear., NP  Subjective:   HPI: Patient is a 68 y.o. female here for bilateral shoulder pain.  10/1: Patient was seen 3 years ago for right shoulder pain and completely improved. She states about 1 week ago in the evening her left shoulder 'locked up' and she couldn't sleep on Sunday night. No known injury or increase in activity level. Pain has improved some since then. Now right shoulder starting to bother her again. Pain with reaching, overhead motions. No bruising or swelling.  11/5: Patient reports she has improved since last visit - both shoulders about 4/10 level of pain. Exercises have been helping. Not taking any medications for this. No new injuries.  Past Medical History  Diagnosis Date  . Arthritis     osteoarthritis  . Fibromyalgia   . History of chicken pox   . Depression   . Diabetes mellitus   . GERD (gastroesophageal reflux disease)   . Hyperlipidemia   . Hypertension   . Migraine   . Personal history of colonic polyps   . History of septic shock 12/2009  . Spinal stenosis   . OSA (obstructive sleep apnea) 03/12/2011  . Hiatal hernia   . Abdominal mass   . Anxiety   . Colitis 2014    Current Outpatient Prescriptions on File Prior to Visit  Medication Sig Dispense Refill  . ALPRAZolam (XANAX) 1 MG tablet TAKE ONE TABLET BY MOUTH TWICE DAILY AS NEEDED 60 tablet 0  . amLODipine (NORVASC) 5 MG tablet TAKE ONE TABLET BY MOUTH ONCE DAILY 30 tablet 3  . aspirin 81 MG tablet Take 81 mg by mouth daily.      Marland Kitchen atorvastatin (LIPITOR) 20 MG tablet TAKE ONE TABLET BY MOUTH ONCE DAILY 90 tablet 0  . Calcium Carbonate-Vitamin D (CALCIUM-D) 600-400 MG-UNIT TABS Take 1 tablet by mouth daily.     . Cholecalciferol (D-3-5) 5000 UNITS capsule Take 5,000 Units by mouth daily.      Marland Kitchen co-enzyme Q-10 30 MG capsule Take 100 mg by mouth daily.      . diphenhydrAMINE  (BENADRYL) 25 MG tablet Take 25 mg by mouth every 6 (six) hours as needed.    Marland Kitchen escitalopram (LEXAPRO) 20 MG tablet TAKE ONE TABLET BY MOUTH ONCE DAILY 30 tablet 2  . gabapentin (NEURONTIN) 300 MG capsule Take 300 mg by mouth 2 (two) times daily.    Marland Kitchen glimepiride (AMARYL) 1 MG tablet Take 1 tablet (1 mg total) by mouth daily with breakfast. 30 tablet 2  . glucose blood (ACCU-CHEK AVIVA) test strip Use as instructed to check blood sugar 1-2 times daily.  Dx 250.00. 100 each 3  . Lancets (ACCU-CHEK MULTICLIX) lancets Use as instructed to check blood sugar two times daily.  Dx 250.00 100 each 2  . lidocaine (LIDODERM) 5 % Place 1 patch onto the skin daily. Remove & Discard patch within 12 hours or as directed by MD 10 patch 1  . losartan (COZAAR) 50 MG tablet TAKE ONE TABLET BY MOUTH ONCE DAILY 30 tablet 1  . meloxicam (MOBIC) 7.5 MG tablet Take 7.5 mg by mouth 2 (two) times daily.    . metFORMIN (GLUCOPHAGE) 500 MG tablet TAKE TWO TABLETS BY MOUTH TWICE DAILY WITH MEALS 120 tablet 5  . metoprolol (LOPRESSOR) 50 MG tablet TAKE ONE-HALF TABLET BY MOUTH TWICE DAILY 30 tablet 5  . Multiple Vitamin (MULTIVITAMIN) tablet Take  1 tablet by mouth daily.      . naproxen (NAPROSYN) 500 MG tablet Take 1 tablet (500 mg total) by mouth 2 (two) times daily as needed. 60 tablet 2  . nystatin-triamcinolone ointment (MYCOLOG) Apply 1 application topically 2 (two) times daily. 60 g 0  . Omega-3 Fatty Acids (FISH OIL) 1000 MG CAPS 2 caps by mouth twice daily  0  . pantoprazole (PROTONIX) 40 MG tablet TAKE ONE TABLET BY MOUTH ONCE DAILY 30 tablet 3  . venlafaxine XR (EFFEXOR-XR) 75 MG 24 hr capsule TAKE TWO CAPSULES BY MOUTH ONCE DAILY 60 capsule 3  . zolpidem (AMBIEN) 10 MG tablet TAKE ONE TABLET BY MOUTH AT BEDTIME AS NEEDED FOR SLEEP 30 tablet 0   No current facility-administered medications on file prior to visit.    Past Surgical History  Procedure Laterality Date  . Breast biopsy Right 2007, 2009  .  Appendectomy  1983  . Medial partial knee replacement Bilateral 2006  . Ovarian cyst removal  2008    Pt states she had a ?cyst removed ?ovaries  . Polypectomy  1998  . Colonoscopy  2014    normal     Allergies  Allergen Reactions  . Erythromycin     "all mycins"  . Prednisone Other (See Comments)    "Insomnia"    History   Social History  . Marital Status: Married    Spouse Name: N/A    Number of Children: 2  . Years of Education: N/A   Occupational History  . Not on file.   Social History Main Topics  . Smoking status: Former Smoker    Types: Cigarettes  . Smokeless tobacco: Never Used     Comment: Smoked socially.  . Alcohol Use: 3.6 - 4.8 oz/week    6-8 Glasses of wine per week  . Drug Use: No  . Sexual Activity: Not on file   Other Topics Concern  . Not on file   Social History Narrative   Regular exercise:  No   Caffeine use: 1 cup daily          Family History  Problem Relation Age of Onset  . Arthritis Mother   . Hyperlipidemia Mother   . Hypertension Mother   . Diabetes Mother   . Heart disease Mother   . Lung cancer Brother   . Heart disease Brother     x 2  . Hypertension Brother   . Hyperlipidemia Brother   . Diabetes Brother   . Sudden death Father   . Diabetes Sister   . Hyperlipidemia Sister   . Hypertension Sister   . Diabetes Maternal Aunt   . Heart attack Maternal Aunt   . Hyperlipidemia Maternal Aunt   . Hypertension Maternal Aunt   . Hypertension Maternal Uncle   . Hyperlipidemia Maternal Uncle   . Heart attack Maternal Uncle   . Diabetes Maternal Uncle   . Diabetes Paternal Aunt   . Heart attack Paternal Aunt   . Hyperlipidemia Paternal Aunt   . Hypertension Paternal Aunt   . Hypertension Paternal Uncle   . Hyperlipidemia Paternal Uncle   . Heart attack Paternal Uncle   . Diabetes Paternal Uncle   . Breast cancer Mother   . Lung cancer Father   . Rectal cancer Father   . Prostate cancer Brother     BP 125/81  mmHg  Pulse 87  Ht 5\' 4"  (1.626 m)  Review of Systems: See HPI above.  Objective:  Physical Exam:  Gen: NAD  Right shoulder: No swelling, ecchymoses.  No gross deformity. TTP AC joint. FROM. Mildly positive Hawkins, Neers. Negative Speeds, Yergasons. Strength 5/5 with empty can and resisted internal/external rotation.  Mild pain empty can. Negative apprehension. Positive adductor crossover. NV intact distally.  Left shoulder: No swelling, ecchymoses.  No gross deformity. No TTP AC joint.  FROM. Mildly positive Hawkins, Neers. Negative Speeds, Yergasons. Strength 5/5 with empty can and resisted internal/external rotation.  Pain empty can. Negative apprehension. NV intact distally.    Assessment & Plan:  1. Bilateral shoulder pain - 2/2 rotator cuff tendinitis bilaterally but also some right AC arthritis.  Right shoulder bothering her the most - she wanted to go ahead with [facility] and subacromial injections here which were done today.  Naproxen.  Consider PT, nitro patches, or injection for left shoulder in future.  Continue HEP.    After informed written consent, patient was seated on exam table. Right shoulder was prepped with alcohol swab and utilizing posterior approach, patient's right subacromial space was injected with 3:1 marcaine: depomedrol. Patient tolerated the procedure well without immediate complications.  After informed written consent, patient was seated in chair in exam room. Right shoulder was prepped with alcohol swab and right AC joint was injected with 0.5:0.5 marcaine: depomedrol. Patient tolerated the procedure well without immediate complications.

## 2013-12-19 NOTE — Assessment & Plan Note (Signed)
2/2 rotator cuff tendinitis bilaterally but also some right AC arthritis.  Right shoulder bothering her the most - she wanted to go ahead with [facility] and subacromial injections here which were done today.  Naproxen.  Consider PT, nitro patches, or injection for left shoulder in future.  Continue HEP.    After informed written consent, patient was seated on exam table. Right shoulder was prepped with alcohol swab and utilizing posterior approach, patient's right subacromial space was injected with 3:1 marcaine: depomedrol. Patient tolerated the procedure well without immediate complications.  After informed written consent, patient was seated in chair in exam room. Right shoulder was prepped with alcohol swab and right AC joint was injected with 0.5:0.5 marcaine: depomedrol. Patient tolerated the procedure well without immediate complications.

## 2013-12-19 NOTE — Telephone Encounter (Signed)
Pt has f/u 01/02/14.  Rx printed and forwarded to Provider for signature.  Medication name:  Name from pharmacy:  ALPRAZolam (XANAX) 1 MG tablet ALPRAZolam 1MG  TAB     Sig: TAKE ONE TABLET BY MOUTH TWICE DAILY AS NEEDED    Dispense: 60 tablet   Refills: 0   Start: 12/19/2013   Class: Normal    Requested on: 10/14/2013    Originally ordered on: 11/27/2010 10/18/2013

## 2013-12-20 NOTE — Telephone Encounter (Signed)
Rx faxed to pharmacy  

## 2013-12-21 ENCOUNTER — Other Ambulatory Visit: Payer: Self-pay | Admitting: Family

## 2013-12-22 NOTE — Telephone Encounter (Signed)
Rx printed and forwarded to Provider for signature. Pt has f/u 01/02/14.          Medication name:  Name from pharmacy:  ALPRAZolam (XANAX) 1 MG tablet ALPRAZolam 1MG  TAB    Sig: TAKE ONE TABLET BY MOUTH TWICE DAILY AS NEEDED    Dispense: 60 tablet   Refills: 0   Start: 12/21/2013   Class: Normal    Requested on: 10/14/2013    Originally ordered on: 11/27/2010 10/18/2013

## 2013-12-22 NOTE — Telephone Encounter (Signed)
Rx faxed to pharmacy  

## 2014-01-02 ENCOUNTER — Encounter: Payer: Self-pay | Admitting: Family

## 2014-01-02 ENCOUNTER — Ambulatory Visit (INDEPENDENT_AMBULATORY_CARE_PROVIDER_SITE_OTHER): Payer: Medicare Other | Admitting: Family

## 2014-01-02 VITALS — BP 156/88 | HR 76 | Temp 97.6°F | Resp 16 | Ht 63.5 in | Wt 204.8 lb

## 2014-01-02 DIAGNOSIS — J309 Allergic rhinitis, unspecified: Secondary | ICD-10-CM | POA: Diagnosis not present

## 2014-01-02 DIAGNOSIS — F32A Depression, unspecified: Secondary | ICD-10-CM

## 2014-01-02 DIAGNOSIS — Z23 Encounter for immunization: Secondary | ICD-10-CM

## 2014-01-02 DIAGNOSIS — F329 Major depressive disorder, single episode, unspecified: Secondary | ICD-10-CM

## 2014-01-02 MED ORDER — FLUTICASONE PROPIONATE 50 MCG/ACT NA SUSP: 2.0000 | Freq: Every day | NASAL | Status: DC

## 2014-01-02 MED ORDER — LORATADINE 10 MG PO TABS: 10.0000 mg | ORAL_TABLET | Freq: Every day | ORAL | Status: DC

## 2014-01-02 NOTE — Progress Notes (Addendum)
Subjective:    Patient ID: Kimberly Lin, female    DOB: [birth year], 68 y.o.   MRN: [id number]  HPI  Kimberly Lin is a 68 yr old female who presents today for follow up of her depression.  Last visit she noted her depression symptoms to be improved on lexapro. Reports feeling fine from a depression standpoint off of lexapro. Reports that she continues effexor.     Wt Readings from Last 3 Encounters:  01/02/14 204 lb 12.8 oz (92.897 kg)  11/10/13 194 lb (87.998 kg)  11/09/13 194 lb 12.8 oz (88.361 kg)   Hoarseness-has had off/on for years.  She continues protonix.  She does report + post nasal drip and snoring.   Review of Systems    see HPI  Past Medical History  Diagnosis Date  . Arthritis     osteoarthritis  . Fibromyalgia   . History of chicken pox   . Depression   . Diabetes mellitus   . GERD (gastroesophageal reflux disease)   . Hyperlipidemia   . Hypertension   . Migraine   . Personal history of colonic polyps   . History of septic shock 12/2009  . Spinal stenosis   . OSA (obstructive sleep apnea) 03/12/2011  . Hiatal hernia   . Abdominal mass   . Anxiety   . Colitis 2014    History   Social History  . Marital Status: Married    Spouse Name: N/A    Number of Children: 2  . Years of Education: N/A   Occupational History  . Not on file.   Social History Main Topics  . Smoking status: Former Smoker    Types: Cigarettes  . Smokeless tobacco: Never Used     Comment: Smoked socially.  . Alcohol Use: 3.6 - 4.8 oz/week    6-8 Glasses of wine per week  . Drug Use: No  . Sexual Activity: Not on file   Other Topics Concern  . Not on file   Social History Narrative   Regular exercise:  No   Caffeine use: 1 cup daily          Past Surgical History  Procedure Laterality Date  . Breast biopsy Right 2007, 2009  . Appendectomy  1983  . Medial partial knee replacement Bilateral 2006  . Ovarian cyst removal  2008    Pt states she had a ?cyst removed  ?ovaries  . Polypectomy  1998  . Colonoscopy  2014    normal     Family History  Problem Relation Age of Onset  . Arthritis Mother   . Hyperlipidemia Mother   . Hypertension Mother   . Diabetes Mother   . Heart disease Mother   . Lung cancer Brother   . Heart disease Brother     x 2  . Hypertension Brother   . Hyperlipidemia Brother   . Diabetes Brother   . Sudden death Father   . Diabetes Sister   . Hyperlipidemia Sister   . Hypertension Sister   . Diabetes Maternal Aunt   . Heart attack Maternal Aunt   . Hyperlipidemia Maternal Aunt   . Hypertension Maternal Aunt   . Hypertension Maternal Uncle   . Hyperlipidemia Maternal Uncle   . Heart attack Maternal Uncle   . Diabetes Maternal Uncle   . Diabetes Paternal Aunt   . Heart attack Paternal Aunt   . Hyperlipidemia Paternal Aunt   . Hypertension Paternal Aunt   . Hypertension Paternal Uncle   .  Hyperlipidemia Paternal Uncle   . Heart attack Paternal Uncle   . Diabetes Paternal Uncle   . Breast cancer Mother   . Lung cancer Father   . Rectal cancer Father   . Prostate cancer Brother     Allergies  Allergen Reactions  . Erythromycin     "all mycins"  . Prednisone Other (See Comments)    "Insomnia"    Current Outpatient Prescriptions on File Prior to Visit  Medication Sig Dispense Refill  . ALPRAZolam (XANAX) 1 MG tablet TAKE ONE TABLET BY MOUTH TWICE DAILY AS NEEDED 60 tablet 0  . amLODipine (NORVASC) 5 MG tablet TAKE ONE TABLET BY MOUTH ONCE DAILY 30 tablet 3  . aspirin 81 MG tablet Take 81 mg by mouth daily.      Marland Kitchen atorvastatin (LIPITOR) 20 MG tablet TAKE ONE TABLET BY MOUTH ONCE DAILY 90 tablet 0  . Calcium Carbonate-Vitamin D (CALCIUM-D) 600-400 MG-UNIT TABS Take 1 tablet by mouth daily.     . Cholecalciferol (D-3-5) 5000 UNITS capsule Take 5,000 Units by mouth daily.      Marland Kitchen co-enzyme Q-10 30 MG capsule Take 100 mg by mouth daily.      . diphenhydrAMINE (BENADRYL) 25 MG tablet Take 25 mg by mouth every 6  (six) hours as needed.    . gabapentin (NEURONTIN) 300 MG capsule Take 300 mg by mouth 2 (two) times daily.    Marland Kitchen glimepiride (AMARYL) 1 MG tablet Take 1 tablet (1 mg total) by mouth daily with breakfast. 30 tablet 2  . glucose blood (ACCU-CHEK AVIVA) test strip Use as instructed to check blood sugar 1-2 times daily.  Dx 250.00. 100 each 3  . Lancets (ACCU-CHEK MULTICLIX) lancets Use as instructed to check blood sugar two times daily.  Dx 250.00 100 each 2  . losartan (COZAAR) 50 MG tablet TAKE ONE TABLET BY MOUTH ONCE DAILY 30 tablet 1  . metFORMIN (GLUCOPHAGE) 500 MG tablet TAKE TWO TABLETS BY MOUTH TWICE DAILY WITH MEALS 120 tablet 5  . metoprolol (LOPRESSOR) 50 MG tablet TAKE ONE-HALF TABLET BY MOUTH TWICE DAILY 30 tablet 5  . Multiple Vitamin (MULTIVITAMIN) tablet Take 1 tablet by mouth daily.      . naproxen (NAPROSYN) 500 MG tablet Take 1 tablet (500 mg total) by mouth 2 (two) times daily as needed. 60 tablet 2  . nystatin-triamcinolone ointment (MYCOLOG) Apply 1 application topically 2 (two) times daily. 60 g 0  . Omega-3 Fatty Acids (FISH OIL) 1000 MG CAPS 2 caps by mouth twice daily  0  . pantoprazole (PROTONIX) 40 MG tablet TAKE ONE TABLET BY MOUTH ONCE DAILY 30 tablet 3  . venlafaxine XR (EFFEXOR-XR) 75 MG 24 hr capsule TAKE TWO CAPSULES BY MOUTH ONCE DAILY 60 capsule 3  . zolpidem (AMBIEN) 10 MG tablet TAKE ONE TABLET BY MOUTH AT BEDTIME AS NEEDED FOR SLEEP 30 tablet 0  . lidocaine (LIDODERM) 5 % Place 1 patch onto the skin daily. Remove & Discard patch within 12 hours or as directed by MD (Patient not taking: Reported on 01/02/2014) 10 patch 1   No current facility-administered medications on file prior to visit.    BP 156/88 mmHg  Pulse 76  Temp(Src) 97.6 F (36.4 C) (Oral)  Resp 16  Ht 5' 3.5" (1.613 m)  Wt 204 lb 12.8 oz (92.897 kg)  BMI 35.71 kg/m2  SpO2 95%     Objective:   Physical Exam  Constitutional: She is oriented to person, place, and time. She  appears  well-developed and well-nourished. No distress.  HENT:  Head: Normocephalic and atraumatic.  Right Ear: Tympanic membrane and ear canal normal.  Left Ear: Tympanic membrane and ear canal normal.  Mouth/Throat: No oropharyngeal exudate, posterior oropharyngeal edema or posterior oropharyngeal erythema.  Cardiovascular: Normal rate and regular rhythm.   No murmur heard. Pulmonary/Chest: Effort normal and breath sounds normal. No respiratory distress. She has no wheezes. She has no rales. She exhibits no tenderness.  Neurological: She is alert and oriented to person, place, and time.  Psychiatric: She has a normal mood and affect. Her behavior is normal. Judgment and thought content normal.          Assessment & Plan:  We did discuss healthy diet, exercise and weight loss.

## 2014-01-02 NOTE — Assessment & Plan Note (Signed)
I think this is cause of her hoarseness. Advised trial of flonase and claritin, call if symptoms worsen or if symptoms do not improve.

## 2014-01-02 NOTE — Progress Notes (Signed)
Pre visit review using our clinic review tool, if applicable. No additional management support is needed unless otherwise documented below in the visit note. 

## 2014-01-02 NOTE — Patient Instructions (Signed)
Start claritin and flonase.  Let me know if you hoarseness worsens or if it does not improve.  Follow up in 3 months.

## 2014-01-02 NOTE — Assessment & Plan Note (Signed)
Stable off of lexapro, continue effexor.

## 2014-01-10 DIAGNOSIS — M4727 Other spondylosis with radiculopathy, lumbosacral region: Secondary | ICD-10-CM | POA: Diagnosis not present

## 2014-01-10 DIAGNOSIS — M545 Low back pain: Secondary | ICD-10-CM | POA: Diagnosis not present

## 2014-01-17 ENCOUNTER — Telehealth: Payer: Self-pay | Admitting: Family

## 2014-01-17 MED ORDER — ZOLPIDEM TARTRATE 10 MG PO TABS: ORAL_TABLET | ORAL | Status: DC

## 2014-01-17 NOTE — Telephone Encounter (Signed)
Refill granted.  Please phone-in to pharmacy.  Additional refills to come from patient's PCP.

## 2014-01-17 NOTE — Telephone Encounter (Signed)
Please advise? I don't see Zoloft on patients current MAR?

## 2014-01-17 NOTE — Telephone Encounter (Signed)
Zoloft not on medication list.  Will need appointment.

## 2014-01-17 NOTE — Telephone Encounter (Signed)
rx called in pts Ila. Done

## 2014-01-17 NOTE — Telephone Encounter (Signed)
Spoke with pt to verify medication pt meant to say zolpidem (ambien) not zoloft...  Last refilled- 12/13/13 #30 / 0 rf  Last OV- 01/02/14  Last UDS- none

## 2014-01-17 NOTE — Telephone Encounter (Signed)
Caller name: [place] Relation to pt:  self Call back number:[phone] Pharmacy:  [facility]  Reason for call:   Patient requesting a refill of zoloft

## 2014-01-27 ENCOUNTER — Other Ambulatory Visit: Payer: Self-pay | Admitting: Family

## 2014-01-30 NOTE — Telephone Encounter (Signed)
Pt has f/u 04/04/14.  Please advise refill:                            me from pharmacy:  ALPRAZolam (XANAX) 1 MG tablet ALPRAZolam 1MG  TAB     Sig: TAKE ONE TABLET BY MOUTH TWICE DAILY AS NEEDED    Dispense: 60 tablet   Refills: 0   Start: 01/27/2014   Class: Normal    Requested on: 12/22/2013    Originally ordered on: 11/27/2010 01/12/2014

## 2014-01-31 NOTE — Telephone Encounter (Signed)
Rx called to pharmacy voicemail. 

## 2014-01-31 NOTE — Telephone Encounter (Signed)
Ok to send 60 tabs zero refills. 

## 2014-02-13 ENCOUNTER — Other Ambulatory Visit: Payer: Self-pay | Admitting: Family

## 2014-02-14 NOTE — Telephone Encounter (Signed)
Rx request to pharmacy/[facility]  

## 2014-02-19 ENCOUNTER — Other Ambulatory Visit: Payer: Self-pay | Admitting: Physician Assistant

## 2014-02-20 NOTE — Telephone Encounter (Signed)
Medication Detail      Disp Refills Start End     zolpidem (AMBIEN) 10 MG tablet 30 tablet 0 01/17/2014     Sig: TAKE ONE TABLET BY MOUTH AT BEDTIME AS NEEDED FOR SLEEP    Class: Phone In     [place], [place].   LAST OV: 11.23.15 RETURN: 3-Mths Please Advise on refills/SLS

## 2014-02-20 NOTE — Telephone Encounter (Signed)
Rx called to pharmacy voicemail. 

## 2014-02-20 NOTE — Telephone Encounter (Signed)
Ok to send 30 tabs zero refills. 

## 2014-02-23 ENCOUNTER — Telehealth: Payer: Self-pay | Admitting: Family

## 2014-02-23 ENCOUNTER — Other Ambulatory Visit: Payer: Self-pay

## 2014-02-23 MED ORDER — GLUCOSE BLOOD VI STRP: ORAL_STRIP | Status: DC

## 2014-02-23 NOTE — Telephone Encounter (Signed)
Refilled to [facility] as requested.

## 2014-02-23 NOTE — Telephone Encounter (Signed)
Caller name:Plate, Kenyanna Relation to UK:GURK Call back number:[phone] Pharmacy:[facility]  Reason for call: pt is needing rx glucose blood (ACCU-CHEK AVIVA) test strip

## 2014-03-02 ENCOUNTER — Other Ambulatory Visit: Payer: Self-pay | Admitting: *Deleted

## 2014-03-02 ENCOUNTER — Other Ambulatory Visit: Payer: Self-pay | Admitting: Family

## 2014-03-02 ENCOUNTER — Other Ambulatory Visit: Payer: Self-pay | Admitting: Family Medicine

## 2014-03-02 MED ORDER — NAPROXEN 500 MG PO TABS
500.0000 mg | ORAL_TABLET | Freq: Two times a day (BID) | ORAL | Status: DC | PRN
Start: 2014-03-02 — End: 2014-09-11

## 2014-03-24 ENCOUNTER — Ambulatory Visit (HOSPITAL_BASED_OUTPATIENT_CLINIC_OR_DEPARTMENT_OTHER)
Admission: RE | Admit: 2014-03-24 | Discharge: 2014-03-24 | Disposition: A | Discharge status: Home / Self Care | Payer: Medicare Other | Source: Ambulatory Visit | Attending: Family | Admitting: Family

## 2014-03-24 ENCOUNTER — Encounter: Payer: Self-pay | Admitting: Family

## 2014-03-24 ENCOUNTER — Ambulatory Visit (INDEPENDENT_AMBULATORY_CARE_PROVIDER_SITE_OTHER): Payer: Medicare Other | Admitting: Family

## 2014-03-24 ENCOUNTER — Other Ambulatory Visit: Payer: Self-pay | Admitting: Family

## 2014-03-24 ENCOUNTER — Ambulatory Visit: Payer: Medicare Other | Admitting: Family

## 2014-03-24 VITALS — BP 152/80 | HR 76 | Temp 98.2°F | Wt 205.4 lb

## 2014-03-24 DIAGNOSIS — E1165 Type 2 diabetes mellitus with hyperglycemia: Secondary | ICD-10-CM | POA: Diagnosis not present

## 2014-03-24 DIAGNOSIS — E785 Hyperlipidemia, unspecified: Secondary | ICD-10-CM | POA: Diagnosis not present

## 2014-03-24 DIAGNOSIS — R0609 Other forms of dyspnea: Secondary | ICD-10-CM | POA: Diagnosis not present

## 2014-03-24 DIAGNOSIS — R0600 Dyspnea, unspecified: Secondary | ICD-10-CM

## 2014-03-24 DIAGNOSIS — E119 Type 2 diabetes mellitus without complications: Secondary | ICD-10-CM | POA: Diagnosis not present

## 2014-03-24 DIAGNOSIS — I10 Essential (primary) hypertension: Secondary | ICD-10-CM | POA: Diagnosis not present

## 2014-03-24 DIAGNOSIS — L304 Erythema intertrigo: Secondary | ICD-10-CM

## 2014-03-24 DIAGNOSIS — R0602 Shortness of breath: Secondary | ICD-10-CM | POA: Insufficient documentation

## 2014-03-24 DIAGNOSIS — IMO0002 Reserved for concepts with insufficient information to code with codable children: Secondary | ICD-10-CM

## 2014-03-24 DIAGNOSIS — R490 Dysphonia: Secondary | ICD-10-CM | POA: Insufficient documentation

## 2014-03-24 LAB — BASIC METABOLIC PANEL
BUN: 12 mg/dL (ref 6–23)
CALCIUM: 9.5 mg/dL (ref 8.4–10.5)
CO2: 32 mEq/L (ref 19–32)
Chloride: 100 mEq/L (ref 96–112)
Creatinine, Ser: 0.84 mg/dL (ref 0.40–1.20)
GFR: 71.61 mL/min (ref >60.00)
Glucose, Bld: 163 mg/dL — ABNORMAL HIGH (ref 70–99)
POTASSIUM: 4.2 meq/L (ref 3.5–5.1)
Sodium: 136 mEq/L (ref 135–145)

## 2014-03-24 LAB — HEPATIC FUNCTION PANEL
ALK PHOS: 52 U/L (ref 39–117)
ALT: 39 U/L — ABNORMAL HIGH (ref 0–35)
AST: 55 U/L — AB (ref 0–37)
Albumin: 4 g/dL (ref 3.5–5.2)
Bilirubin, Direct: 0.1 mg/dL (ref 0.0–0.3)
Total Bilirubin: 0.6 mg/dL (ref 0.2–1.2)
Total Protein: 7.1 g/dL (ref 6.0–8.3)

## 2014-03-24 LAB — MICROALBUMIN / CREATININE URINE RATIO
CREATININE, U: 237.6 mg/dL
MICROALB/CREAT RATIO: 0.8 mg/g (ref 0.0–30.0)
Microalb, Ur: 2 mg/dL — ABNORMAL HIGH (ref 0.0–1.9)

## 2014-03-24 LAB — HEMOGLOBIN A1C: HEMOGLOBIN A1C: 7.9 % — AB (ref 4.6–6.5)

## 2014-03-24 LAB — LIPID PANEL
Cholesterol: 164 mg/dL (ref 0–200)
HDL: 34.5 mg/dL — ABNORMAL LOW (ref >39.00)
NonHDL: 129.5
Total CHOL/HDL Ratio: 5
Triglycerides: 268 mg/dL — ABNORMAL HIGH (ref 0.0–149.0)
VLDL: 53.6 mg/dL — ABNORMAL HIGH (ref 0.0–40.0)

## 2014-03-24 LAB — LDL CHOLESTEROL, DIRECT: Direct LDL: 98 mg/dL

## 2014-03-24 MED ORDER — NYSTATIN 100000 UNIT/GM EX POWD: CUTANEOUS | Status: DC

## 2014-03-24 MED ORDER — ZOLPIDEM TARTRATE 10 MG PO TABS: 10.0000 mg | ORAL_TABLET | Freq: Every evening | ORAL | Status: DC | PRN

## 2014-03-24 MED ORDER — METOPROLOL TARTRATE 50 MG PO TABS: 50.0000 mg | ORAL_TABLET | Freq: Two times a day (BID) | ORAL | Status: DC

## 2014-03-24 NOTE — Telephone Encounter (Signed)
Rx sent to the pharmacy by e-script.//AB/CMA 

## 2014-03-24 NOTE — Assessment & Plan Note (Signed)
Unchanged, refer to ENT.

## 2014-03-24 NOTE — Patient Instructions (Addendum)
You will be contacted about your referral to ENT. Increase metoprolol to a full tab 50mg  twice daily. Start nystatin powder beneath your breasts twice daily for rash. Complete lab work prior to leaving. Complete chest x ray on the first floor. Follow up in 1 month for nurse visit Blood pressure check. Schedule routine follow up in 3 months.

## 2014-03-24 NOTE — Assessment & Plan Note (Signed)
Uncontrolled, increase metoprolol from 25mg  bid to 50mg  bid, continue amlodipine/losartan

## 2014-03-24 NOTE — Assessment & Plan Note (Signed)
Beneath breasts, rx with nystatin powder.

## 2014-03-24 NOTE — Progress Notes (Signed)
Pre visit review using our clinic review tool, if applicable. No additional management support is needed unless otherwise documented below in the visit note. 

## 2014-03-24 NOTE — Assessment & Plan Note (Signed)
Check baseline CXR.

## 2014-03-24 NOTE — Progress Notes (Signed)
Subjective:    Patient ID: Kimberly Lin, female    DOB: Dec 25, 1945, 69 y.o.   MRN: [id number]  HPI  Patient here for follow up.  1) DM2- Pt is currently maintained on the following medications for diabetes: amaryl, metformin Last A1C was-  Lab Results  Component Value Date   HGBA1C 7.1* 06/28/2013  Last diabetic eye exam was:   Denies polyuria/polydipsia. Denies hypoglycemia Home glucose readings range:  Not checking  2) HTN- Patient is currently maintained on the following medications for blood pressure: amlodipine, losartan, metoprolol Patient reports good compliance with blood pressure medications. Patient denies chest pain, shortness of breath or swelling. Last 3 blood pressure readings in our office are as follows: BP Readings from Last 3 Encounters:  03/24/14 152/80  01/02/14 156/88  12/15/13 125/81    3) Hyperlipidemia-  Patient is currently maintained on the following medication for hyperlipidemia: atovastatin Last lipid panel as follows:  Lab Results  Component Value Date   CHOL 150 06/28/2013   HDL 52 06/28/2013   LDLCALC 74 06/28/2013   TRIG 122 06/28/2013   CHOLHDL 2.9 06/28/2013   Hoarseness- reports that this has not improved despite use of PPI. Notes intermittent SOB as well. She is a former smoker. Had negative cardiac work up previously,    Past Medical History  Diagnosis Date  . Arthritis     osteoarthritis  . Fibromyalgia   . History of chicken pox   . Depression   . Diabetes mellitus   . GERD (gastroesophageal reflux disease)   . Hyperlipidemia   . Hypertension   . Migraine   . Personal history of colonic polyps   . History of septic shock 12/2009  . Spinal stenosis   . OSA (obstructive sleep apnea) 03/12/2011  . Hiatal hernia   . Abdominal mass   . Anxiety   . Colitis 2014    Review of Systems See HPI  Past Medical History  Diagnosis Date  . Arthritis     osteoarthritis  . Fibromyalgia   . History of chicken pox   .  Depression   . Diabetes mellitus   . GERD (gastroesophageal reflux disease)   . Hyperlipidemia   . Hypertension   . Migraine   . Personal history of colonic polyps   . History of septic shock 12/2009  . Spinal stenosis   . OSA (obstructive sleep apnea) 03/12/2011  . Hiatal hernia   . Abdominal mass   . Anxiety   . Colitis 2014    History   Social History  . Marital Status: Married    Spouse Name: N/A  . Number of Children: 2  . Years of Education: N/A   Occupational History  . Not on file.   Social History Main Topics  . Smoking status: Former Smoker    Types: Cigarettes  . Smokeless tobacco: Never Used     Comment: Smoked socially.  . Alcohol Use: 3.6 - 4.8 oz/week    6-8 Glasses of wine per week  . Drug Use: No  . Sexual Activity: Not on file   Other Topics Concern  . Not on file   Social History Narrative   Regular exercise:  No   Caffeine use: 1 cup daily          Past Surgical History  Procedure Laterality Date  . Breast biopsy Right 2007, 2009  . Appendectomy  1983  . Medial partial knee replacement Bilateral 2006  . Ovarian cyst removal  2008    Pt states she had a ?cyst removed ?ovaries  . Polypectomy  1998  . Colonoscopy  2014    normal     Family History  Problem Relation Age of Onset  . Arthritis Mother   . Hyperlipidemia Mother   . Hypertension Mother   . Diabetes Mother   . Heart disease Mother   . Lung cancer Brother   . Heart disease Brother     x 2  . Hypertension Brother   . Hyperlipidemia Brother   . Diabetes Brother   . Sudden death Father   . Diabetes Sister   . Hyperlipidemia Sister   . Hypertension Sister   . Diabetes Maternal Aunt   . Heart attack Maternal Aunt   . Hyperlipidemia Maternal Aunt   . Hypertension Maternal Aunt   . Hypertension Maternal Uncle   . Hyperlipidemia Maternal Uncle   . Heart attack Maternal Uncle   . Diabetes Maternal Uncle   . Diabetes Paternal Aunt   . Heart attack Paternal Aunt   .  Hyperlipidemia Paternal Aunt   . Hypertension Paternal Aunt   . Hypertension Paternal Uncle   . Hyperlipidemia Paternal Uncle   . Heart attack Paternal Uncle   . Diabetes Paternal Uncle   . Breast cancer Mother   . Lung cancer Father   . Rectal cancer Father   . Prostate cancer Brother     Allergies  Allergen Reactions  . Erythromycin     "all mycins"  . Prednisone Other (See Comments)    "Insomnia"    Current Outpatient Prescriptions on File Prior to Visit  Medication Sig Dispense Refill  . ALPRAZolam (XANAX) 1 MG tablet TAKE ONE TABLET BY MOUTH TWICE DAILY AS NEEDED 60 tablet 0  . amLODipine (NORVASC) 5 MG tablet TAKE ONE TABLET BY MOUTH ONCE DAILY 30 tablet 2  . aspirin 81 MG tablet Take 81 mg by mouth daily.      Marland Kitchen atorvastatin (LIPITOR) 20 MG tablet TAKE ONE TABLET BY MOUTH ONCE DAILY 90 tablet 0  . Calcium Carbonate-Vitamin D (CALCIUM-D) 600-400 MG-UNIT TABS Take 1 tablet by mouth daily.     . Cholecalciferol (D-3-5) 5000 UNITS capsule Take 5,000 Units by mouth daily.      Marland Kitchen co-enzyme Q-10 30 MG capsule Take 100 mg by mouth daily.      . diphenhydrAMINE (BENADRYL) 25 MG tablet Take 25 mg by mouth every 6 (six) hours as needed.    . fluticasone (FLONASE) 50 MCG/ACT nasal spray Place 2 sprays into both nostrils daily. 16 g 6  . gabapentin (NEURONTIN) 300 MG capsule Take 300 mg by mouth 2 (two) times daily.    Marland Kitchen glimepiride (AMARYL) 1 MG tablet TAKE 1 TABLET BY MOUTH ONCE DAILY WITH BREAKFAST 30 tablet 2  . glucose blood (ACCU-CHEK AVIVA) test strip Use as instructed to check blood sugar 1-2 times daily.  Dx E11.9 100 each 3  . Lancets (ACCU-CHEK MULTICLIX) lancets Use as instructed to check blood sugar two times daily.  Dx 250.00 100 each 2  . lidocaine (LIDODERM) 5 % Place 1 patch onto the skin daily. Remove & Discard patch within 12 hours or as directed by MD (Patient not taking: Reported on 01/02/2014) 10 patch 1  . loratadine (CLARITIN) 10 MG tablet Take 1 tablet (10 mg  total) by mouth daily. 30 tablet 11  . losartan (COZAAR) 50 MG tablet TAKE ONE TABLET BY MOUTH ONCE DAILY 30 tablet 2  . metFORMIN (  GLUCOPHAGE) 500 MG tablet TAKE TWO TABLETS BY MOUTH TWICE DAILY WITH MEALS 120 tablet 5  . metoprolol (LOPRESSOR) 50 MG tablet TAKE ONE-HALF TABLET BY MOUTH TWICE DAILY 30 tablet 1  . Multiple Vitamin (MULTIVITAMIN) tablet Take 1 tablet by mouth daily.      . naproxen (NAPROSYN) 500 MG tablet Take 1 tablet (500 mg total) by mouth 2 (two) times daily as needed. 60 tablet 0  . nystatin-triamcinolone ointment (MYCOLOG) Apply 1 application topically 2 (two) times daily. 60 g 0  . Omega-3 Fatty Acids (FISH OIL) 1000 MG CAPS 2 caps by mouth twice daily  0  . pantoprazole (PROTONIX) 40 MG tablet TAKE ONE TABLET BY MOUTH ONCE DAILY 30 tablet 2  . venlafaxine XR (EFFEXOR-XR) 75 MG 24 hr capsule TAKE TWO CAPSULES BY MOUTH ONCE DAILY 60 capsule 3  . zolpidem (AMBIEN) 10 MG tablet TAKE ONE TABLET BY MOUTH AT BEDTIME AS NEEDED FOR SLEEP 30 tablet 0   No current facility-administered medications on file prior to visit.    There were no vitals taken for this visit.       Objective:    Physical Exam  Constitutional: She is oriented to person, place, and time. She appears well-developed and well-nourished.  HENT:  Head: Normocephalic and atraumatic.  + voice hoarseness.  Cardiovascular: Normal rate, regular rhythm and normal heart sounds.   No murmur heard. Pulmonary/Chest: Effort normal and breath sounds normal. No respiratory distress. She has no wheezes.  Neurological: She is alert and oriented to person, place, and time.  Skin:  Rash beneath both breasts  Psychiatric: She has a normal mood and affect. Her behavior is normal. Judgment and thought content normal.    There were no vitals taken for this visit. Wt Readings from Last 3 Encounters:  01/02/14 204 lb 12.8 oz (92.897 kg)  11/10/13 194 lb (87.998 kg)  11/09/13 194 lb 12.8 oz (88.361 kg)     Lab  Results  Component Value Date   WBC 8.6 01/18/2013   HGB 14.0 01/18/2013   HCT 41.2 01/18/2013   PLT 296.0 01/18/2013   GLUCOSE 143* 08/10/2013   CHOL 150 06/28/2013   TRIG 122 06/28/2013   HDL 52 06/28/2013   LDLCALC 74 06/28/2013   ALT 20 09/13/2013   AST 26 09/13/2013   NA 134* 08/10/2013   K 4.6 08/10/2013   CL 97 08/10/2013   CREATININE 0.89 08/10/2013   BUN 16 08/10/2013   CO2 29 08/10/2013   TSH 2.199 06/17/2011   HGBA1C 7.1* 06/28/2013   MICROALBUR 9.53* 06/15/2012          Assessment & Plan:   Problem List Items Addressed This Visit    None       O'SULLIVAN,Shayra Anton S., NP

## 2014-03-24 NOTE — Assessment & Plan Note (Signed)
Clinically stable, obtain A1C, urine micro, continue current meds.

## 2014-03-26 ENCOUNTER — Telehealth: Payer: Self-pay | Admitting: Family

## 2014-03-26 DIAGNOSIS — K760 Fatty (change of) liver, not elsewhere classified: Secondary | ICD-10-CM

## 2014-03-26 MED ORDER — PIOGLITAZONE HCL 30 MG PO TABS
30.0000 mg | ORAL_TABLET | Freq: Every day | ORAL | Status: DC
Start: 2014-03-26 — End: 2014-05-26

## 2014-03-26 NOTE — Telephone Encounter (Signed)
Please contact pt and let her know sugar is uncontrolled. I recommend that she add actos once daily.  Liver function testing is abnormal.  This is likely due to fatty liver which was noted on  Previous ultrasound. I advise her to work hard on low fat diabetic diet, exercise, weight loss.

## 2014-03-27 NOTE — Telephone Encounter (Signed)
Notified pt and she voices understanding. 

## 2014-04-04 ENCOUNTER — Ambulatory Visit: Payer: Medicare Other | Admitting: Family

## 2014-04-05 ENCOUNTER — Telehealth: Payer: Self-pay | Admitting: Family

## 2014-04-05 NOTE — Telephone Encounter (Signed)
Caller name:Stockert, Liela Relation to UO:HFGB Call back number:[phone] Pharmacy:[facility]  Reason for call: pt was seen last week for rash on under breast, pt states that the rx nystatin (MYCOSTATIN/NYSTOP) 100000 UNIT/GM POWD , pt states it is not working at all, and itching has gotten worse, would like to know if something else can be called in.

## 2014-04-06 MED ORDER — FLUCONAZOLE 150 MG PO TABS: ORAL_TABLET | ORAL | Status: DC

## 2014-04-06 NOTE — Telephone Encounter (Signed)
rx sent for diflucan, call if symptoms worsen or do not improve.

## 2014-04-06 NOTE — Telephone Encounter (Signed)
Kimberly Lin-- can you check on ENT referral for this pt?  Notified pt and she voices understanding. States she has not heard from anyone re: ENT referral. Advised pt referral was faxed to [facility] on 03/24/14.

## 2014-04-06 NOTE — Telephone Encounter (Signed)
Pt has appt with Dr Erik Obey on 3/2 @3 :50, pt aware

## 2014-04-07 ENCOUNTER — Other Ambulatory Visit: Payer: Self-pay | Admitting: Family

## 2014-04-07 NOTE — Telephone Encounter (Signed)
Last filled: 11/23/13 Amt:  60, 3 refills Last OV:  03/24/14  Med filled

## 2014-04-11 ENCOUNTER — Telehealth: Payer: Self-pay | Admitting: *Deleted

## 2014-04-11 DIAGNOSIS — K219 Gastro-esophageal reflux disease without esophagitis: Secondary | ICD-10-CM | POA: Diagnosis not present

## 2014-04-11 DIAGNOSIS — R490 Dysphonia: Secondary | ICD-10-CM | POA: Diagnosis not present

## 2014-04-11 MED ORDER — ALPRAZOLAM 1 MG PO TABS: 1.0000 mg | ORAL_TABLET | Freq: Two times a day (BID) | ORAL | Status: DC | PRN

## 2014-04-11 NOTE — Telephone Encounter (Signed)
Rx called to pharmacy voicemail. 

## 2014-04-11 NOTE — Telephone Encounter (Signed)
Ok to send #60 no refills.

## 2014-04-11 NOTE — Telephone Encounter (Signed)
Received request from pharmacy for xanax 1mg  twice a day. Pt due for follow up 06/2014. Last rx given 01/31/14, #60. Please advise.

## 2014-04-17 ENCOUNTER — Telehealth: Payer: Self-pay | Admitting: Family

## 2014-04-17 DIAGNOSIS — Z1239 Encounter for other screening for malignant neoplasm of breast: Secondary | ICD-10-CM

## 2014-04-17 NOTE — Telephone Encounter (Signed)
Notified pt. She states she discussed this at her office visit in December / November? Reports she has intermittent sharp twinges in both breasts and discomfort with certain movements. States this is the same as at her previous visit. Pt states it is time for routine annual exam and wanted to do the 3D imaging.  Please advise.

## 2014-04-17 NOTE — Telephone Encounter (Signed)
I have placed order

## 2014-04-17 NOTE — Telephone Encounter (Signed)
Please put in an order for 3 d breast imaging as her breasts hurt

## 2014-04-17 NOTE — Telephone Encounter (Signed)
I am happy to order but since she is having pain, she should be seen in office for breast exam first please.

## 2014-04-18 NOTE — Telephone Encounter (Signed)
Notified pts husband.

## 2014-04-19 ENCOUNTER — Telehealth: Payer: Self-pay | Admitting: Internal Medicine

## 2014-04-19 NOTE — Telephone Encounter (Signed)
Patient states she started having diarrhea last night. She has taken Imodium. It is like when she had colitis when she saw Dr. Sharlett Iles. Denies recent antibiotic use or travel out of country. Scheduled with Tye Savoy, NP on 04/20/14 at 2:30 PM.

## 2014-04-20 ENCOUNTER — Encounter: Payer: Self-pay | Admitting: Nurse Practitioner

## 2014-04-20 ENCOUNTER — Ambulatory Visit (INDEPENDENT_AMBULATORY_CARE_PROVIDER_SITE_OTHER): Payer: Medicare Other | Admitting: Nurse Practitioner

## 2014-04-20 VITALS — BP 122/80 | HR 72 | Ht 63.5 in | Wt 204.8 lb

## 2014-04-20 DIAGNOSIS — R197 Diarrhea, unspecified: Secondary | ICD-10-CM

## 2014-04-20 NOTE — Progress Notes (Signed)
     History of Present Illness:  Patient is a 69 year old female followed by Dr. Sharlett Iles prior to his retirement. She was evaluated in December 2014 for diarrhea. Colon biopsies revealed collagenous colitis. Patient responded to course of Entocort.   Patient comes in today after acute onset of diarrhea at 2am yesterday. Diarrhea was explosive, multiple times. She had some mild periumbilical burning and bloating but no pain nor vomiting. Patient took " a lot of Imodium". She hasn't had any diarrhea today. No recent antibiotics. No sick contacts.   Current Medications, Allergies, Past Medical History, Past Surgical History, Family History and Social History were reviewed in [employer] record.   Physical Exam: General: Pleasant, well developed , white female in no acute distress Head: Normocephalic and atraumatic Eyes:  sclerae anicteric, conjunctiva pink  Ears: Normal auditory acuity Lungs: Clear throughout to auscultation Heart: Regular rate and rhythm Abdomen: Soft, non distended, non-tender. No masses, no hepatomegaly. Normal bowel sounds Musculoskeletal: Symmetrical with no gross deformities  Extremities: No edema  Neurological: Alert oriented x 4, grossly nonfocal Psychological:  Alert and cooperative. Normal mood and affect  Assessment and Recommendations:   Acute explosive diarrhea which started yesterday at 2am. No diarrhea today after taking double doses of Imodium yesterday. Diarrhea a little different than when she had collagenous colitis in that there is associated bloating and periumbilical burning. She is on metformin but this doesn't sound like medication induced diarrhea. Doubt small intestinal bacterial overgrowth. Will collect stool for C-diff. If negative but diarrhea persists, then will need to consider whether this could be recurrent collagenous colitis. Abdominal exam is benign.  I advised her to hold Imodium since diarrhea has resolved for  now. Will call her with stool results.

## 2014-04-20 NOTE — Patient Instructions (Signed)
Go to the basement today for your labs Hold Lomotil for now Will call you with results when we get them

## 2014-04-21 DIAGNOSIS — R197 Diarrhea, unspecified: Secondary | ICD-10-CM | POA: Insufficient documentation

## 2014-04-21 NOTE — Progress Notes (Signed)
Agree with Ms. Guenther's assessment and plan. Sydnei Ohaver E. Asser Lucena, MD, FACG   

## 2014-04-27 DIAGNOSIS — M545 Low back pain: Secondary | ICD-10-CM | POA: Diagnosis not present

## 2014-04-27 DIAGNOSIS — M4727 Other spondylosis with radiculopathy, lumbosacral region: Secondary | ICD-10-CM | POA: Diagnosis not present

## 2014-05-03 ENCOUNTER — Telehealth: Payer: Self-pay | Admitting: Family

## 2014-05-03 NOTE — Telephone Encounter (Signed)
Last zolpidem rx 03/24/14, #30 x no refills.  Last Alprazolam Rx 04/11/14, #60 x no refills. Please advise.

## 2014-05-03 NOTE — Telephone Encounter (Signed)
Caller name: Lynnzie, Blackson Relation to pt: self   Call back number: [phone] Pharmacy: [facility] [address] [place]. [phone] (Phone) [phone] (Fax)         Reason for call:  Pt requesting a refill zolpidem (AMBIEN) 10 MG tablet and ALPRAZolam (XANAX) 1 MG tablet

## 2014-05-08 MED ORDER — ZOLPIDEM TARTRATE 10 MG PO TABS: 10.0000 mg | ORAL_TABLET | Freq: Every evening | ORAL | Status: DC | PRN

## 2014-05-08 MED ORDER — ALPRAZOLAM 1 MG PO TABS: 1.0000 mg | ORAL_TABLET | Freq: Two times a day (BID) | ORAL | Status: DC | PRN

## 2014-05-08 NOTE — Telephone Encounter (Signed)
Patient called in requesting this.

## 2014-05-08 NOTE — Telephone Encounter (Signed)
Patient requesting Xanax and Ambien  LOV 03/24/14 (due 06/2014)  Ambien last refill: 03/24/14 for 30 with 0 Xanax- 04/11/14 for 60 and 0 (takes BID)  No UDS/ contract found   Please advise.

## 2014-05-08 NOTE — Telephone Encounter (Signed)
Pt request ambien and xanax. Her pcp is not in the office. I got request to refill. I am covering today  for [name] NP who is not in office. No controlled me agreement that I see in chart. I looked under media. Will provide ambien #10. Will provide 2 weeks of xanax. Pt should get further refills with pcp. Will provide enough so pt does not run out. Rx placed at desk of lpn/ma so pt can be notified.

## 2014-05-09 ENCOUNTER — Other Ambulatory Visit: Payer: Self-pay | Admitting: Family Medicine

## 2014-05-09 ENCOUNTER — Other Ambulatory Visit: Payer: Self-pay | Admitting: Family

## 2014-05-10 ENCOUNTER — Encounter: Payer: Self-pay | Admitting: Medical

## 2014-05-10 NOTE — Progress Notes (Signed)
Prescriptons left at front office for patient which she did not pick up. Went to pharmacy to pick up her meds and they were not there. Spoke with  Patient advised that she was supposed to pick up here. Called in Xanax #28 and Ambien #10 to her pharmacy per E.Saguier. Patient to seek future refills from her Provider.

## 2014-05-15 ENCOUNTER — Encounter: Payer: Self-pay | Admitting: Family

## 2014-05-15 ENCOUNTER — Ambulatory Visit (INDEPENDENT_AMBULATORY_CARE_PROVIDER_SITE_OTHER): Payer: Medicare Other | Admitting: Family

## 2014-05-15 VITALS — BP 140/98 | HR 78 | Temp 97.8°F | Resp 16 | Ht 63.5 in | Wt 206.0 lb

## 2014-05-15 DIAGNOSIS — R5382 Chronic fatigue, unspecified: Secondary | ICD-10-CM

## 2014-05-15 DIAGNOSIS — G4733 Obstructive sleep apnea (adult) (pediatric): Secondary | ICD-10-CM

## 2014-05-15 DIAGNOSIS — R0609 Other forms of dyspnea: Secondary | ICD-10-CM

## 2014-05-15 DIAGNOSIS — R0602 Shortness of breath: Secondary | ICD-10-CM

## 2014-05-15 DIAGNOSIS — E669 Obesity, unspecified: Secondary | ICD-10-CM

## 2014-05-15 DIAGNOSIS — R0600 Dyspnea, unspecified: Secondary | ICD-10-CM

## 2014-05-15 NOTE — Patient Instructions (Signed)
You will be contacted about your stress test and your sleep study. Please schedule a lab draw at the front desk.  Follow up in 1 month.

## 2014-05-15 NOTE — Progress Notes (Signed)
Subjective:    Patient ID: Kimberly Lin, female    DOB: 07-03-1945, 69 y.o.   MRN: [id number]  HPI  Kimberly Lin is a 69 yr old female who presents today to discuss fatigue. She reports DOE for several months. Of note pt does have OSA.  Had home sleep study 1/13.  She has mild osa with an AHI 13/hr. Per Dr. Gwenette Greet - recommend: This can be treated conservatively with weight loss, but if she is symptomatic could consider cpap/dental appliance/surgery.  Of note she was 21 pounds lighter at the time of her sleep study in 4010- mild diastolic dysfunction 2725 on echo.  She denies chest pain.  She reports DOE is not new but has worsened. Had negative stress test 2013 Denies LE edema.     Review of Systems See HPI  Past Medical History  Diagnosis Date  . Arthritis     osteoarthritis  . Fibromyalgia   . History of chicken pox   . Depression   . Diabetes mellitus   . GERD (gastroesophageal reflux disease)   . Hyperlipidemia   . Hypertension   . Migraine   . Personal history of colonic polyps   . History of septic shock 12/2009  . Spinal stenosis   . OSA (obstructive sleep apnea) 03/12/2011  . Hiatal hernia   . Abdominal mass   . Anxiety   . Colitis 2014    History   Social History  . Marital Status: Married    Spouse Name: N/A  . Number of Children: 2  . Years of Education: N/A   Occupational History  . Not on file.   Social History Main Topics  . Smoking status: Former Smoker    Types: Cigarettes  . Smokeless tobacco: Never Used     Comment: Smoked socially.  . Alcohol Use: 3.6 - 4.8 oz/week    6-8 Glasses of wine per week  . Drug Use: No  . Sexual Activity: Not on file   Other Topics Concern  . Not on file   Social History Narrative   Regular exercise:  No   Caffeine use: 1 cup daily          Past Surgical History  Procedure Laterality Date  . Breast biopsy Right 2007, 2009  . Appendectomy  1983  . Medial partial knee replacement Bilateral 2006  .  Ovarian cyst removal  2008    Pt states she had a ?cyst removed ?ovaries  . Polypectomy  1998  . Colonoscopy  2014    normal     Family History  Problem Relation Age of Onset  . Arthritis Mother   . Hyperlipidemia Mother   . Hypertension Mother   . Diabetes Mother   . Heart disease Mother   . Lung cancer Brother   . Heart disease Brother     x 2  . Hypertension Brother   . Hyperlipidemia Brother   . Diabetes Brother   . Sudden death Father   . Diabetes Sister   . Hyperlipidemia Sister   . Hypertension Sister   . Diabetes Maternal Aunt   . Heart attack Maternal Aunt   . Hyperlipidemia Maternal Aunt   . Hypertension Maternal Aunt   . Hypertension Maternal Uncle   . Hyperlipidemia Maternal Uncle   . Heart attack Maternal Uncle   . Diabetes Maternal Uncle   . Diabetes Paternal Aunt   . Heart attack Paternal Aunt   . Hyperlipidemia Paternal Aunt   .  Hypertension Paternal Aunt   . Hypertension Paternal Uncle   . Hyperlipidemia Paternal Uncle   . Heart attack Paternal Uncle   . Diabetes Paternal Uncle   . Breast cancer Mother   . Lung cancer Father   . Rectal cancer Father   . Prostate cancer Brother     Allergies  Allergen Reactions  . Erythromycin     "all mycins"  . Prednisone Other (See Comments)    "Insomnia"    Current Outpatient Prescriptions on File Prior to Visit  Medication Sig Dispense Refill  . ALPRAZolam (XANAX) 1 MG tablet Take 1 tablet (1 mg total) by mouth 2 (two) times daily as needed for anxiety. 28 tablet 0  . amLODipine (NORVASC) 5 MG tablet TAKE ONE TABLET BY MOUTH ONCE DAILY 30 tablet 2  . aspirin 81 MG tablet Take 81 mg by mouth daily.      Marland Kitchen atorvastatin (LIPITOR) 20 MG tablet TAKE ONE TABLET BY MOUTH ONCE DAILY 90 tablet 0  . Calcium Carbonate-Vitamin D (CALCIUM-D) 600-400 MG-UNIT TABS Take 1 tablet by mouth daily.     Marland Kitchen co-enzyme Q-10 30 MG capsule Take 100 mg by mouth daily.      . diphenhydrAMINE (BENADRYL) 25 MG tablet Take 25 mg by  mouth every 6 (six) hours as needed.    . gabapentin (NEURONTIN) 300 MG capsule Take 300 mg by mouth 2 (two) times daily.    Marland Kitchen glimepiride (AMARYL) 1 MG tablet TAKE 1 TABLET BY MOUTH ONCE DAILY WITH BREAKFAST 30 tablet 2  . losartan (COZAAR) 50 MG tablet TAKE ONE TABLET BY MOUTH ONCE DAILY 30 tablet 0  . metFORMIN (GLUCOPHAGE) 500 MG tablet TAKE TWO TABLETS BY MOUTH TWICE DAILY WITH MEALS 120 tablet 5  . metoprolol (LOPRESSOR) 50 MG tablet Take 1 tablet (50 mg total) by mouth 2 (two) times daily. 60 tablet 2  . Multiple Vitamin (MULTIVITAMIN) tablet Take 1 tablet by mouth daily.      . naproxen (NAPROSYN) 500 MG tablet Take 1 tablet (500 mg total) by mouth 2 (two) times daily as needed. 60 tablet 0  . nystatin-triamcinolone ointment (MYCOLOG) Apply 1 application topically 2 (two) times daily. 60 g 0  . Omega-3 Fatty Acids (FISH OIL) 1000 MG CAPS 2 caps by mouth twice daily  0  . pantoprazole (PROTONIX) 40 MG tablet TAKE ONE TABLET BY MOUTH ONCE DAILY 30 tablet 2  . pioglitazone (ACTOS) 30 MG tablet Take 1 tablet (30 mg total) by mouth daily. 30 tablet 2  . venlafaxine XR (EFFEXOR-XR) 75 MG 24 hr capsule TAKE TWO CAPSULES BY MOUTH ONCE DAILY 60 capsule 2  . zolpidem (AMBIEN) 10 MG tablet Take 1 tablet (10 mg total) by mouth at bedtime as needed. for sleep 30 tablet 0   No current facility-administered medications on file prior to visit.    BP 140/98 mmHg  Pulse 78  Temp(Src) 97.8 F (36.6 C) (Oral)  Resp 16  Ht 5' 3.5" (1.613 m)  Wt 206 lb (93.441 kg)  BMI 35.91 kg/m2  SpO2 99%       Objective:   Physical Exam  Constitutional: She is oriented to person, place, and time. She appears well-developed and well-nourished.  HENT:  Head: Normocephalic and atraumatic.  Cardiovascular: Normal rate, regular rhythm and normal heart sounds.   No murmur heard. Pulmonary/Chest: Effort normal and breath sounds normal. No respiratory distress. She has no wheezes.  Neurological: She is alert and  oriented to person, place, and time.  Psychiatric: She has a normal mood and affect. Her behavior is normal. Judgment and thought content normal.          Assessment & Plan:

## 2014-05-15 NOTE — Progress Notes (Signed)
Pre visit review using our clinic review tool, if applicable. No additional management support is needed unless otherwise documented below in the visit note. 

## 2014-05-16 ENCOUNTER — Other Ambulatory Visit: Payer: Medicare Other

## 2014-05-16 ENCOUNTER — Encounter: Payer: Self-pay | Admitting: Family

## 2014-05-16 DIAGNOSIS — E871 Hypo-osmolality and hyponatremia: Secondary | ICD-10-CM

## 2014-05-16 DIAGNOSIS — R5383 Other fatigue: Secondary | ICD-10-CM | POA: Insufficient documentation

## 2014-05-16 LAB — CBC WITH DIFFERENTIAL/PLATELET
Basophils Absolute: 0.1 10*3/uL (ref 0.0–0.1)
Basophils Relative: 0.7 % (ref 0.0–3.0)
EOS PCT: 4.9 % (ref 0.0–5.0)
Eosinophils Absolute: 0.4 10*3/uL (ref 0.0–0.7)
HCT: 41.4 % (ref 36.0–46.0)
Hemoglobin: 14 g/dL (ref 12.0–15.0)
LYMPHS PCT: 28.3 % (ref 12.0–46.0)
Lymphs Abs: 2.2 10*3/uL (ref 0.7–4.0)
MCHC: 33.8 g/dL (ref 30.0–36.0)
MCV: 92.2 fl (ref 78.0–100.0)
MONO ABS: 0.5 10*3/uL (ref 0.1–1.0)
MONOS PCT: 6 % (ref 3.0–12.0)
NEUTROS PCT: 60.1 % (ref 43.0–77.0)
Neutro Abs: 4.7 10*3/uL (ref 1.4–7.7)
PLATELETS: 232 10*3/uL (ref 150.0–400.0)
RBC: 4.49 Mil/uL (ref 3.87–5.11)
RDW: 13.3 % (ref 11.5–15.5)
WBC: 7.7 10*3/uL (ref 4.0–10.5)

## 2014-05-16 LAB — BASIC METABOLIC PANEL
BUN: 20 mg/dL (ref 6–23)
CO2: 25 meq/L (ref 19–32)
Calcium: 9.3 mg/dL (ref 8.4–10.5)
Chloride: 104 mEq/L (ref 96–112)
Creatinine, Ser: 0.87 mg/dL (ref 0.40–1.20)
GFR: 68.73 mL/min (ref >60.00)
GLUCOSE: 151 mg/dL — AB (ref 70–99)
POTASSIUM: 3.9 meq/L (ref 3.5–5.1)
Sodium: 137 mEq/L (ref 135–145)

## 2014-05-16 LAB — TSH: TSH: 3.65 u[IU]/mL (ref 0.35–4.50)

## 2014-05-16 NOTE — Assessment & Plan Note (Signed)
Discussed weight loss. 

## 2014-05-16 NOTE — Assessment & Plan Note (Signed)
Has worsened.  Likely multifactorial (obesity, deconditioning, untreated OSA), however her EKG is abnormal.  TWI lead V1 and V4- however this is not new compared to prior EKG.  Will refer for follow up stress test, pt advised to go to the ER if she develops chest pain.

## 2014-05-16 NOTE — Assessment & Plan Note (Signed)
Blood count and tsh are normal.  I suspect that she has worsening OSA due to weight gain and may now benefit from CPAP. Will arrange a split night sleep study.

## 2014-05-22 ENCOUNTER — Other Ambulatory Visit (HOSPITAL_COMMUNITY): Payer: Medicare Other

## 2014-05-22 ENCOUNTER — Encounter (HOSPITAL_COMMUNITY): Payer: Medicare Other

## 2014-05-23 ENCOUNTER — Other Ambulatory Visit: Payer: Self-pay | Admitting: Family

## 2014-05-23 MED ORDER — ZOLPIDEM TARTRATE 10 MG PO TABS: 10.0000 mg | ORAL_TABLET | Freq: Every evening | ORAL | Status: DC | PRN

## 2014-05-23 MED ORDER — ALPRAZOLAM 1 MG PO TABS: 1.0000 mg | ORAL_TABLET | Freq: Two times a day (BID) | ORAL | Status: DC | PRN

## 2014-05-23 NOTE — Telephone Encounter (Signed)
Notified pt of Rx completion and sleep study will be w/ [facility] on 6/16 @ 8pm, they will mail patient all the info. Gave pt contact # to call if she does not receive packet in the mail re: sleep study.

## 2014-05-23 NOTE — Telephone Encounter (Signed)
Rxs faxed to [place]. Please advise status of split night sleep study ordered on 05/15/14?

## 2014-05-23 NOTE — Telephone Encounter (Signed)
Caller name: Hadleigh Relation to pt: self Call back number: [phone] [facility] [address]  Reason for call:   Patient is requesting a refill of xanax and ambien. Patient states that she saw Percell Miller and was prescribed these two meds but the correct tablets weren't called in correctly. Patient is out of [place].   Patient states that the sleep clinic has not contacted her as of yet.

## 2014-05-23 NOTE — Telephone Encounter (Signed)
Anderson Malta- can you please check status of split night sleep study I placed on 4/4. Gilmore Laroche- please See rx's.

## 2014-05-25 ENCOUNTER — Encounter (HOSPITAL_COMMUNITY): Payer: Medicare Other

## 2014-05-25 ENCOUNTER — Ambulatory Visit (HOSPITAL_COMMUNITY): Payer: Medicare Other | Attending: Cardiology | Admitting: Radiology

## 2014-05-25 DIAGNOSIS — Z87891 Personal history of nicotine dependence: Secondary | ICD-10-CM | POA: Insufficient documentation

## 2014-05-25 DIAGNOSIS — I10 Essential (primary) hypertension: Secondary | ICD-10-CM | POA: Insufficient documentation

## 2014-05-25 DIAGNOSIS — E785 Hyperlipidemia, unspecified: Secondary | ICD-10-CM | POA: Insufficient documentation

## 2014-05-25 DIAGNOSIS — E119 Type 2 diabetes mellitus without complications: Secondary | ICD-10-CM | POA: Diagnosis not present

## 2014-05-25 DIAGNOSIS — R002 Palpitations: Secondary | ICD-10-CM | POA: Diagnosis not present

## 2014-05-25 DIAGNOSIS — R0609 Other forms of dyspnea: Secondary | ICD-10-CM | POA: Diagnosis not present

## 2014-05-25 DIAGNOSIS — R5383 Other fatigue: Secondary | ICD-10-CM | POA: Diagnosis not present

## 2014-05-25 DIAGNOSIS — R0602 Shortness of breath: Secondary | ICD-10-CM

## 2014-05-25 MED ORDER — REGADENOSON 0.4 MG/5ML IV SOLN
0.4000 mg | Freq: Once | INTRAVENOUS | Status: AC
  Administered 2014-05-25: 0.4 mg via INTRAVENOUS

## 2014-05-25 MED ORDER — TECHNETIUM TC 99M SESTAMIBI GENERIC - CARDIOLITE
33.0000 | Freq: Once | INTRAVENOUS | Status: AC | PRN
  Administered 2014-05-25: 33 via INTRAVENOUS

## 2014-05-25 NOTE — Progress Notes (Signed)
[facility] [address] [place], [place] [address] [phone]    Cardiology Nuclear Med Study  Kimberly Lin is a 69 y.o. female     MRN : [id number]     DOB: 04-12-1945  Procedure Date: 05/25/2014  Nuclear Med Background Indication for Stress Test:  Evaluation for Ischemia History:  04/28/11 MPI: NL, EF; 73%, Lipids, PVCS Cardiac Risk Factors: History of Smoking, Hypertension, Lipids and NIDDM  Symptoms:  DOE, Fatigue and Palpitations   Nuclear Pre-Procedure Caffeine/Decaff Intake:  None> 12 hrs NPO After: 4:00pm   Lungs:  clear O2 Sat: 98% on room air. IV 0.9% NS with Angio Cath:  24g  IV Site: R Wrist, tolerated well IV Started by:  Irven Baltimore, RN  Chest Size (in):  40 Cup Size: DD  Height: 5' 3.5" (1.613 m)  Weight:  203 lb (92.08 kg)  BMI:  Body mass index is 35.39 kg/(m^2). Tech Comments:  Patient held Lopressor x 24 hrs. Irven Baltimore, RN.    Nuclear Med Study 1 or 2 day study: 2 day  Stress Test Type:  Carlton Adam  Reading MD: N/A  Order Authorizing Provider:  Debbrah Alar, NP, and Penni Homans, MD  Resting Radionuclide: Technetium 59m Sestamibi  Resting Radionuclide Dose: 33.0 mCi on 05/30/2014  Stress Radionuclide:  Technetium 28m Sestamibi  Stress Radionuclide Dose: 33.0 mCi on 05/25/2014           Stress Protocol Rest HR: 99 Stress HR: 115  Rest BP: 126/71 Stress BP: 148/73  Exercise Time (min): n/a METS: n/a   Predicted Max HR: 152 bpm % Max HR: 75.66 bpm Rate Pressure Product: 17020   Dose of Adenosine (mg):  n/a Dose of Lexiscan: 0.4 mg  Dose of Atropine (mg): n/a Dose of Dobutamine: n/a mcg/kg/min (at max HR)  Stress Test Technologist: Perrin Maltese, EMT-P  Nuclear Technologist:  Earl Many, CNMT     Rest Procedure:  Myocardial perfusion imaging was performed at rest 45 minutes following the intravenous administration of Technetium 37m Sestamibi. Rest ECG: NSR - Normal EKG  Stress Procedure:  The  patient received IV Lexiscan 0.4 mg over 15-seconds.  Technetium 70m Sestamibi injected at 30-seconds. This patient was dizzy with the Lexiscan injection. Quantitative spect images were obtained after a 45 minute delay. Stress ECG: No significant change from baseline ECG  QPS Raw Data Images:  Mild diaphragmatic attenuation.  Normal left ventricular size. Stress Images:  There is decreased uptake in the basal and mid inferolateral and inferior walls.  Rest Images:  Normal homogeneous uptake in all areas of the myocardium. Subtraction (SDS):  4 Transient Ischemic Dilatation (Normal <1.22):  0.88 Lung/Heart Ratio (Normal <0.45):  0.33  Quantitative Gated Spect Images QGS EDV:  59 ml QGS ESV:  17 ml  Impression Exercise Capacity:  Lexiscan with low level exercise. BP Response:  Normal blood pressure response. Clinical Symptoms:  No significant symptoms noted. ECG Impression:  No significant ST segment change suggestive of ischemia. Comparison with Prior Nuclear Study: No images to compare  Overall Impression:  Intermediate risk stress nuclear study with medium size, mild severity ischemia in the basal and mid inferior and inferolateral walls (SDS4). .  LV Ejection Fraction: 71%.  LV Wall Motion:  NL LV Function; NL Wall Motion   [name] 05/30/2014

## 2014-05-26 ENCOUNTER — Telehealth: Payer: Self-pay | Admitting: Family

## 2014-05-26 ENCOUNTER — Other Ambulatory Visit: Payer: Self-pay | Admitting: *Deleted

## 2014-05-26 MED ORDER — GLIMEPIRIDE 1 MG PO TABS: ORAL_TABLET | ORAL | Status: DC

## 2014-05-26 MED ORDER — LOSARTAN POTASSIUM 50 MG PO TABS: 50.0000 mg | ORAL_TABLET | Freq: Every day | ORAL | Status: DC

## 2014-05-26 MED ORDER — PANTOPRAZOLE SODIUM 40 MG PO TBEC: 40.0000 mg | DELAYED_RELEASE_TABLET | Freq: Every day | ORAL | Status: DC

## 2014-05-26 MED ORDER — NYSTATIN 100000 UNIT/GM EX POWD: CUTANEOUS | Status: DC

## 2014-05-26 MED ORDER — PIOGLITAZONE HCL 30 MG PO TABS: 30.0000 mg | ORAL_TABLET | Freq: Every day | ORAL | Status: DC

## 2014-05-26 MED ORDER — ATORVASTATIN CALCIUM 20 MG PO TABS: 20.0000 mg | ORAL_TABLET | Freq: Every day | ORAL | Status: DC

## 2014-05-26 NOTE — Telephone Encounter (Signed)
Most likely due to fungal rash as before. Will send refill for nystatin. Pt should follow up in office if rash worsens or does not improve.

## 2014-05-26 NOTE — Telephone Encounter (Signed)
Caller name: Samaira Relation to pt: self Call back number: [phone] [facility]: [facility]  Reason for call:   Patient had nuclear stress test yesterday and did not wear a bra yesterday. Patient states that she is broke out underneath both breasts and wants to know if this could be a reaction from the test? Patient states that this rash has happened before and is requesting the powder that has been prescribed for her before be called in again.    Also, requesting refills of pantoprazole,pioglitazone, losartan, and glimipiride

## 2014-05-26 NOTE — Telephone Encounter (Signed)
Refills sent. Notified pt of below recommendation and she voices understanding.

## 2014-05-29 DIAGNOSIS — M545 Low back pain: Secondary | ICD-10-CM | POA: Diagnosis not present

## 2014-05-29 DIAGNOSIS — Z6836 Body mass index (BMI) 36.0-36.9, adult: Secondary | ICD-10-CM | POA: Diagnosis not present

## 2014-05-29 DIAGNOSIS — M4727 Other spondylosis with radiculopathy, lumbosacral region: Secondary | ICD-10-CM | POA: Diagnosis not present

## 2014-05-30 ENCOUNTER — Ambulatory Visit (HOSPITAL_COMMUNITY): Payer: Medicare Other | Attending: Cardiology

## 2014-05-30 DIAGNOSIS — Z1231 Encounter for screening mammogram for malignant neoplasm of breast: Secondary | ICD-10-CM | POA: Diagnosis not present

## 2014-05-30 MED ORDER — TECHNETIUM TC 99M SESTAMIBI GENERIC - CARDIOLITE
30.0000 | Freq: Once | INTRAVENOUS | Status: AC | PRN
  Administered 2014-05-30: 30 via INTRAVENOUS

## 2014-05-31 ENCOUNTER — Telehealth: Payer: Self-pay | Admitting: Family

## 2014-05-31 ENCOUNTER — Other Ambulatory Visit: Payer: Self-pay

## 2014-05-31 DIAGNOSIS — R9439 Abnormal result of other cardiovascular function study: Secondary | ICD-10-CM

## 2014-05-31 NOTE — Telephone Encounter (Signed)
Please contact pt and let her know that stress test is abnormal. I would like her to meet with cardiologist for consultation.  She should go to ER if worsening SOB or if chest pain occurs. Continue aspirin 81mg  once daily.

## 2014-05-31 NOTE — Telephone Encounter (Signed)
Left message on voicemail to return my call.  

## 2014-05-31 NOTE — Telephone Encounter (Signed)
Notified pt and she is agreeable to proceed with cardiology consultation. Pt would like to see Dr Ron Parker or Stanford Breed.

## 2014-06-09 ENCOUNTER — Telehealth: Payer: Self-pay | Admitting: Family

## 2014-06-09 NOTE — Telephone Encounter (Signed)
Patient Name: Kimberly Lin Caller: self Ph [phone] Pharmacy: [facility] Reason for Call: pt has been out of meds for 1 week. Said she asked [facility] [address] to submit request. Please send refill for amLODipine (NORVASC) 5 MG tablet [[id number]] to [facility] on [address] asap.

## 2014-06-12 MED ORDER — AMLODIPINE BESYLATE 5 MG PO TABS: 5.0000 mg | ORAL_TABLET | Freq: Every day | ORAL | Status: DC

## 2014-06-12 NOTE — Telephone Encounter (Signed)
Refill sent. Notified pt. 

## 2014-06-21 ENCOUNTER — Telehealth: Payer: Self-pay | Admitting: *Deleted

## 2014-06-21 MED ORDER — ALPRAZOLAM 1 MG PO TABS: 1.0000 mg | ORAL_TABLET | Freq: Two times a day (BID) | ORAL | Status: DC | PRN

## 2014-06-21 MED ORDER — ZOLPIDEM TARTRATE 10 MG PO TABS: 10.0000 mg | ORAL_TABLET | Freq: Every evening | ORAL | Status: DC | PRN

## 2014-06-21 NOTE — Telephone Encounter (Signed)
Rx's faxed to pharmacy

## 2014-06-21 NOTE — Telephone Encounter (Signed)
Received fax request from pharmacy for ambien and xanax. Last RXs given 05/23/14. No current CSC or UDS on file.  Will obtain at next office visit.  Rxs printed and forwarded to Provider for signature. Pt is due for 1 month follow up now.

## 2014-06-27 ENCOUNTER — Telehealth: Payer: Self-pay | Admitting: Family

## 2014-06-27 ENCOUNTER — Encounter: Payer: Self-pay | Admitting: Cardiology

## 2014-06-27 ENCOUNTER — Encounter: Payer: Self-pay | Admitting: *Deleted

## 2014-06-27 ENCOUNTER — Ambulatory Visit (INDEPENDENT_AMBULATORY_CARE_PROVIDER_SITE_OTHER): Payer: Medicare Other | Admitting: Cardiology

## 2014-06-27 VITALS — BP 132/80 | HR 69 | Ht 63.5 in | Wt 207.0 lb

## 2014-06-27 DIAGNOSIS — R9439 Abnormal result of other cardiovascular function study: Secondary | ICD-10-CM

## 2014-06-27 DIAGNOSIS — Z01812 Encounter for preprocedural laboratory examination: Secondary | ICD-10-CM

## 2014-06-27 DIAGNOSIS — R0609 Other forms of dyspnea: Secondary | ICD-10-CM

## 2014-06-27 DIAGNOSIS — R0600 Dyspnea, unspecified: Secondary | ICD-10-CM

## 2014-06-27 DIAGNOSIS — R42 Dizziness and giddiness: Secondary | ICD-10-CM | POA: Diagnosis not present

## 2014-06-27 DIAGNOSIS — R9431 Abnormal electrocardiogram [ECG] [EKG]: Secondary | ICD-10-CM

## 2014-06-27 NOTE — Patient Instructions (Signed)
Medication Instructions:   Your physician recommends that you continue on your current medications as directed. Please refer to the Current Medication list given to you today.    Labwork:  COME FOR PRE-PROCEDURE LAB WORK ON THIS Friday 06/30/14 AT OUR OFFICE TO CHECK CMET, CBC W DIFF, PT/INR    Testing/Procedures:  Your physician has requested that you have a cardiac catheterization. Cardiac catheterization is used to diagnose and/or treat various heart conditions. Doctors may recommend this procedure for a number of different reasons. The most common reason is to evaluate chest pain. Chest pain can be a symptom of coronary artery disease (CAD), and cardiac catheterization can show whether plaque is narrowing or blocking your heart's arteries. This procedure is also used to evaluate the valves, as well as measure the blood flow and oxygen levels in different parts of your heart. For further information please visit [email or web address]. Please follow instruction sheet, as given.  YOUR CATH IS SCHEDULED FOR NEXT Wednesday 07/05/14 AT 10 AM WITH DR VARANASI---YOU MUST ARRIVE AT [place] AT 8 AM ON THE DAY OF YOUR CATH---REPORT TO SHORT STAY    Your physician has requested that you have a carotid duplex. This test is an ultrasound of the carotid arteries in your neck. It looks at blood flow through these arteries that supply the brain with blood. Allow one hour for this exam. There are no restrictions or special instructions.     Follow-Up:  [place] [facility]

## 2014-06-27 NOTE — Progress Notes (Signed)
Patient ID: Kimberly Lin, female   DOB: 1945-04-27, 69 y.o.   MRN: [id number]      Cardiology Office Note   Date:  06/28/2014   ID:  NASHAE Lin, DOB Dec 21, 1945, MRN [id number]  PCP:  Nance Pear., NP  Cardiologist: [name], MD   Chief complain: DOE  History of Present Illness: Kimberly Lin is a 69 y.o. female who presents for the evaluation of dyspnea on exertion. The patient has h/o DM2, hypertension, hyperlipidemia and significant family history of premature CAD - brother has MI at his 89', mother has MI, multiple aunts and uncles had MIs.  She has noticed DOE in the last couple of months and underwent a [place] nuclear stress test that was abnormal. The patient has occasional palpitations - short lasting with no associated symptoms, no syncopy. She denies chest pain, but feels profoundly tired.  No orthopnea or PND, no LE edema.  She has been experiencing dizziness with rapid motions and fell on her forehead.   Past Medical History  Diagnosis Date  . Arthritis     osteoarthritis  . Fibromyalgia   . History of chicken pox   . Depression   . Diabetes mellitus   . GERD (gastroesophageal reflux disease)   . Hyperlipidemia   . Hypertension   . Migraine   . Personal history of colonic polyps   . History of septic shock 12/2009  . Spinal stenosis   . OSA (obstructive sleep apnea) 03/12/2011  . Hiatal hernia   . Abdominal mass   . Anxiety   . Colitis 2014    Past Surgical History  Procedure Laterality Date  . Breast biopsy Right 2007, 2009  . Appendectomy  1983  . Medial partial knee replacement Bilateral 2006  . Ovarian cyst removal  2008    Pt states she had a ?cyst removed ?ovaries  . Polypectomy  1998  . Colonoscopy  2014    normal      Current Outpatient Prescriptions  Medication Sig Dispense Refill  . ALPRAZolam (XANAX) 1 MG tablet Take 1 tablet (1 mg total) by mouth 2 (two) times daily as needed for anxiety. 60 tablet 0  .  amLODipine (NORVASC) 5 MG tablet Take 1 tablet (5 mg total) by mouth daily. 30 tablet 5  . aspirin 81 MG tablet Take 81 mg by mouth daily.      Marland Kitchen atorvastatin (LIPITOR) 20 MG tablet Take 1 tablet (20 mg total) by mouth daily. 90 tablet 1  . Calcium Carbonate-Vitamin D (CALCIUM-D) 600-400 MG-UNIT TABS Take 1 tablet by mouth daily.     Marland Kitchen co-enzyme Q-10 30 MG capsule Take 100 mg by mouth daily.      . diphenhydrAMINE (BENADRYL) 25 MG tablet Take 25 mg by mouth every 6 (six) hours as needed.    . gabapentin (NEURONTIN) 300 MG capsule Take 300 mg by mouth 2 (two) times daily.    Marland Kitchen glimepiride (AMARYL) 1 MG tablet TAKE 1 TABLET BY MOUTH ONCE DAILY WITH BREAKFAST 90 tablet 1  . losartan (COZAAR) 50 MG tablet Take 1 tablet (50 mg total) by mouth daily. 90 tablet 1  . metFORMIN (GLUCOPHAGE) 500 MG tablet TAKE TWO TABLETS BY MOUTH TWICE DAILY WITH MEALS 120 tablet 5  . metoprolol (LOPRESSOR) 50 MG tablet Take 1 tablet (50 mg total) by mouth 2 (two) times daily. 60 tablet 2  . Multiple Vitamin (MULTIVITAMIN) tablet Take 1 tablet by mouth daily.      Marland Kitchen  naproxen (NAPROSYN) 500 MG tablet Take 1 tablet (500 mg total) by mouth 2 (two) times daily as needed. 60 tablet 0  . nystatin (MYCOSTATIN/NYSTOP) 100000 UNIT/GM POWD Apply bid prn to affected area 30 g 1  . nystatin-triamcinolone ointment (MYCOLOG) Apply 1 application topically 2 (two) times daily. 60 g 0  . Omega-3 Fatty Acids (FISH OIL) 1000 MG CAPS 2 caps by mouth twice daily  0  . pantoprazole (PROTONIX) 40 MG tablet Take 1 tablet (40 mg total) by mouth daily. 90 tablet 1  . pioglitazone (ACTOS) 30 MG tablet Take 1 tablet (30 mg total) by mouth daily. 90 tablet 1  . venlafaxine XR (EFFEXOR-XR) 75 MG 24 hr capsule TAKE TWO CAPSULES BY MOUTH ONCE DAILY 60 capsule 2  . zolpidem (AMBIEN) 10 MG tablet Take 1 tablet (10 mg total) by mouth at bedtime as needed. for sleep 30 tablet 0   No current facility-administered medications for this visit.    Allergies:    Erythromycin and Prednisone    Social History:  The patient  reports that she has quit smoking. Her smoking use included Cigarettes. She has never used smokeless tobacco. She reports that she drinks about 3.6 - 4.8 oz of alcohol per week. She reports that she does not use illicit drugs.   Family History:  The patient's family history includes Arthritis in her mother; Breast cancer in her mother; Diabetes in her brother, maternal aunt, maternal uncle, mother, paternal aunt, paternal uncle, and sister; Heart attack in her maternal aunt, maternal uncle, paternal aunt, and paternal uncle; Heart disease in her brother and mother; Hyperlipidemia in her brother, maternal aunt, maternal uncle, mother, paternal aunt, paternal uncle, and sister; Hypertension in her brother, maternal aunt, maternal uncle, mother, paternal aunt, paternal uncle, and sister; Lung cancer in her brother and father; Prostate cancer in her brother; Rectal cancer in her father; Sudden death in her father.    ROS:  Please see the history of present illness. All other systems are reviewed and negative.    PHYSICAL EXAM: VS:  BP 132/80 mmHg  Pulse 69  Ht 5' 3.5" (1.613 m)  Wt 207 lb (93.895 kg)  BMI 36.09 kg/m2 , BMI Body mass index is 36.09 kg/(m^2). GEN: Well nourished, well developed, in no acute distress HEENT: normal Neck: no JVD, carotid bruits, or masses Cardiac: RRR; no murmurs, rubs, or gallops,no edema  Respiratory:  clear to auscultation bilaterally, normal work of breathing GI: soft, nontender, nondistended, + BS MS: no deformity or atrophy Skin: warm and dry, no rash Neuro:  Strength and sensation are intact Psych: euthymic mood, full affect  EKG: The ekg ordered today demonstrates SR, negative T waves in the anterior leads  Recent Labs: 03/24/2014: ALT 39* 05/16/2014: BUN 20; Creatinine 0.87; Hemoglobin 14.0; Platelets 232.0; Potassium 3.9; Sodium 137; TSH 3.65    Lipid Panel    Component Value Date/Time     CHOL 164 03/24/2014 1450   TRIG 268.0* 03/24/2014 1450   HDL 34.50* 03/24/2014 1450   CHOLHDL 5 03/24/2014 1450   VLDL 53.6* 03/24/2014 1450   LDLCALC 74 06/28/2013 1144   LDLDIRECT 98.0 03/24/2014 1450      Wt Readings from Last 3 Encounters:  06/27/14 207 lb (93.895 kg)  05/25/14 203 lb (92.08 kg)  05/15/14 206 lb (93.441 kg)    Lexiscan nuclear stress test: Exercise Capacity: Lexiscan with low level exercise. BP Response: Normal blood pressure response. Clinical Symptoms: No significant symptoms noted. ECG Impression: No significant  ST segment change suggestive of ischemia. Comparison with Prior Nuclear Study: No images to compare  Overall Impression: Intermediate risk stress nuclear study with medium size, mild severity ischemia in the basal and mid inferior and inferolateral walls (SDS4). .  LV Ejection Fraction: 71%. LV Wall Motion: NL LV Function; NL Wall Motion  Other studies Reviewed: Additional studies/ records that were reviewed today include: TTE 12/15/2012 Study Conclusions  Left ventricle: The cavity size was normal. Wall thickness was increased in a pattern of mild LVH. Systolic function was vigorous. The estimated ejection fraction was in the range of 65% to 70%. Wall motion was normal; there were no regional wall motion abnormalities. Doppler parameters are consistent with abnormal left ventricular relaxation (grade 1 diastolic dysfunction).     ASSESSMENT AND PLAN:  1. DOE - positive stress test - RF - DM2, obesity, HTN, HLP, significant FH of premature CAD, we will schedule a cath  2. HTN - controlled  3. Hyperlipidemia - LDL 98, HDL 34, TG 268, on atorvastatin 20 mg po daily and fish oil, I will await cath result. She needs to be on at least atorvastatin 40 mg po daily to reach goal of LDL < 70, if TG continue to be elevated switch fish oil to fenofibrate.  4. Dizziness - we will oredr carotid US  Labs/ tests ordered today include:    Orders Placed This Encounter  Procedures  . INR/PT  . CBC w/Diff  . Comp Met (CMET)  . EKG 12-Lead   Disposition:   Cath on 09/03/14, follow up in 2 months.  Signed, [name], MD  06/28/2014 12:08 AM    [place], [place], [place]  [address] Phone: [phone]; Fax: [phone]

## 2014-06-28 ENCOUNTER — Telehealth: Payer: Self-pay | Admitting: *Deleted

## 2014-06-28 NOTE — Telephone Encounter (Signed)
Patient spoke with [facility] at 2:54 06/27/14.  Message states:   "Caller states had injury on Saturday.  Has several things to speak to Dr. About.  Dizzy spell and hit head.  Dizziness has not gone away.  Hoarseness.  Reports that she did have an injury on Sat and she is requesting that an appt be made.  Reports that she is dizzy when she bends over.  She turned her head to the side quickly and got dizzy, fell and hit the doorframe.   She continues to have dizziness, constant, bending over makes this worse.  She was not seen by MD after the fall.  She also reports that she has hoarseness-- Disposition: See PCP When office is open (within 3 days)"  Called patient to follow-up and offer an appointment.  Message left on machine to return call.

## 2014-06-30 ENCOUNTER — Other Ambulatory Visit: Payer: Medicare Other

## 2014-07-03 ENCOUNTER — Other Ambulatory Visit (INDEPENDENT_AMBULATORY_CARE_PROVIDER_SITE_OTHER): Payer: Medicare Other | Admitting: *Deleted

## 2014-07-03 DIAGNOSIS — Z01812 Encounter for preprocedural laboratory examination: Secondary | ICD-10-CM

## 2014-07-03 DIAGNOSIS — R42 Dizziness and giddiness: Secondary | ICD-10-CM

## 2014-07-03 DIAGNOSIS — R0600 Dyspnea, unspecified: Secondary | ICD-10-CM

## 2014-07-03 DIAGNOSIS — R0609 Other forms of dyspnea: Secondary | ICD-10-CM | POA: Diagnosis not present

## 2014-07-03 DIAGNOSIS — Z5181 Encounter for therapeutic drug level monitoring: Secondary | ICD-10-CM

## 2014-07-03 LAB — COMPREHENSIVE METABOLIC PANEL
ALT: 17 U/L (ref 0–35)
AST: 23 U/L (ref 0–37)
Albumin: 4.1 g/dL (ref 3.5–5.2)
Alkaline Phosphatase: 49 U/L (ref 39–117)
BUN: 14 mg/dL (ref 6–23)
CO2: 28 mEq/L (ref 19–32)
Calcium: 9.5 mg/dL (ref 8.4–10.5)
Chloride: 101 mEq/L (ref 96–112)
Creatinine, Ser: 0.85 mg/dL (ref 0.40–1.20)
GFR: 70.58 mL/min (ref >60.00)
Glucose, Bld: 145 mg/dL — ABNORMAL HIGH (ref 70–99)
Potassium: 4.3 mEq/L (ref 3.5–5.1)
Sodium: 135 mEq/L (ref 135–145)
Total Bilirubin: 0.6 mg/dL (ref 0.2–1.2)
Total Protein: 7 g/dL (ref 6.0–8.3)

## 2014-07-03 LAB — CBC WITH DIFFERENTIAL/PLATELET
Basophils Absolute: 0 10*3/uL (ref 0.0–0.1)
Basophils Relative: 0.5 % (ref 0.0–3.0)
Eosinophils Absolute: 0.8 10*3/uL — ABNORMAL HIGH (ref 0.0–0.7)
Eosinophils Relative: 10.2 % — ABNORMAL HIGH (ref 0.0–5.0)
HCT: 40.6 % (ref 36.0–46.0)
Hemoglobin: 13.7 g/dL (ref 12.0–15.0)
Lymphocytes Relative: 22 % (ref 12.0–46.0)
Lymphs Abs: 1.7 10*3/uL (ref 0.7–4.0)
MCHC: 33.8 g/dL (ref 30.0–36.0)
MCV: 91.3 fl (ref 78.0–100.0)
Monocytes Absolute: 0.5 10*3/uL (ref 0.1–1.0)
Monocytes Relative: 7.2 % (ref 3.0–12.0)
Neutro Abs: 4.5 10*3/uL (ref 1.4–7.7)
Neutrophils Relative %: 60.1 % (ref 43.0–77.0)
Platelets: 211 10*3/uL (ref 150.0–400.0)
RBC: 4.45 Mil/uL (ref 3.87–5.11)
RDW: 14.1 % (ref 11.5–15.5)
WBC: 7.5 10*3/uL (ref 4.0–10.5)

## 2014-07-03 LAB — PROTIME-INR
INR: 1 ratio (ref 0.8–1.0)
Prothrombin Time: 10.8 s (ref 9.6–13.1)

## 2014-07-03 NOTE — Addendum Note (Signed)
Addended by: Eulis Foster on: 07/03/2014 01:22 PM   Modules accepted: Orders

## 2014-07-05 ENCOUNTER — Encounter (HOSPITAL_COMMUNITY)
Admission: RE | Disposition: A | Discharge status: Home / Self Care | Payer: Medicare Other | Source: Ambulatory Visit | Attending: Interventional Cardiology

## 2014-07-05 ENCOUNTER — Encounter (HOSPITAL_COMMUNITY): Payer: Self-pay | Admitting: *Deleted

## 2014-07-05 ENCOUNTER — Ambulatory Visit (HOSPITAL_COMMUNITY)
Admission: RE | Admit: 2014-07-05 | Discharge: 2014-07-05 | Disposition: A | Discharge status: Home / Self Care | Payer: Medicare Other | Source: Ambulatory Visit | Attending: Interventional Cardiology | Admitting: Interventional Cardiology

## 2014-07-05 DIAGNOSIS — G4733 Obstructive sleep apnea (adult) (pediatric): Secondary | ICD-10-CM | POA: Diagnosis not present

## 2014-07-05 DIAGNOSIS — R0600 Dyspnea, unspecified: Secondary | ICD-10-CM | POA: Diagnosis present

## 2014-07-05 DIAGNOSIS — I10 Essential (primary) hypertension: Secondary | ICD-10-CM | POA: Diagnosis not present

## 2014-07-05 DIAGNOSIS — F329 Major depressive disorder, single episode, unspecified: Secondary | ICD-10-CM | POA: Insufficient documentation

## 2014-07-05 DIAGNOSIS — E119 Type 2 diabetes mellitus without complications: Secondary | ICD-10-CM | POA: Diagnosis not present

## 2014-07-05 DIAGNOSIS — Z86010 Personal history of colonic polyps: Secondary | ICD-10-CM | POA: Diagnosis not present

## 2014-07-05 DIAGNOSIS — M1990 Unspecified osteoarthritis, unspecified site: Secondary | ICD-10-CM | POA: Insufficient documentation

## 2014-07-05 DIAGNOSIS — E669 Obesity, unspecified: Secondary | ICD-10-CM | POA: Diagnosis not present

## 2014-07-05 DIAGNOSIS — E785 Hyperlipidemia, unspecified: Secondary | ICD-10-CM | POA: Insufficient documentation

## 2014-07-05 DIAGNOSIS — Z6833 Body mass index (BMI) 33.0-33.9, adult: Secondary | ICD-10-CM | POA: Diagnosis not present

## 2014-07-05 DIAGNOSIS — Z96653 Presence of artificial knee joint, bilateral: Secondary | ICD-10-CM | POA: Diagnosis not present

## 2014-07-05 DIAGNOSIS — G43909 Migraine, unspecified, not intractable, without status migrainosus: Secondary | ICD-10-CM | POA: Diagnosis not present

## 2014-07-05 DIAGNOSIS — K219 Gastro-esophageal reflux disease without esophagitis: Secondary | ICD-10-CM | POA: Diagnosis not present

## 2014-07-05 DIAGNOSIS — Z8249 Family history of ischemic heart disease and other diseases of the circulatory system: Secondary | ICD-10-CM | POA: Diagnosis not present

## 2014-07-05 DIAGNOSIS — Z7982 Long term (current) use of aspirin: Secondary | ICD-10-CM | POA: Diagnosis not present

## 2014-07-05 DIAGNOSIS — I2510 Atherosclerotic heart disease of native coronary artery without angina pectoris: Secondary | ICD-10-CM | POA: Diagnosis not present

## 2014-07-05 DIAGNOSIS — Z87891 Personal history of nicotine dependence: Secondary | ICD-10-CM | POA: Diagnosis not present

## 2014-07-05 DIAGNOSIS — F419 Anxiety disorder, unspecified: Secondary | ICD-10-CM | POA: Insufficient documentation

## 2014-07-05 DIAGNOSIS — R9439 Abnormal result of other cardiovascular function study: Secondary | ICD-10-CM | POA: Diagnosis present

## 2014-07-05 HISTORY — PX: CARDIAC CATHETERIZATION: SHX172

## 2014-07-05 HISTORY — DX: Abnormal result of other cardiovascular function study: R94.39

## 2014-07-05 LAB — GLUCOSE, CAPILLARY: GLUCOSE-CAPILLARY: 139 mg/dL — AB (ref 65–99)

## 2014-07-05 SURGERY — LEFT HEART CATH AND CORONARY ANGIOGRAPHY

## 2014-07-05 MED ORDER — SODIUM CHLORIDE 0.9 % IJ SOLN: 3.0000 mL | INTRAMUSCULAR | Status: DC | PRN

## 2014-07-05 MED ORDER — HEPARIN SODIUM (PORCINE) 1000 UNIT/ML IJ SOLN
INTRAMUSCULAR | Status: AC
  Filled 2014-07-05: qty 1

## 2014-07-05 MED ORDER — LIDOCAINE HCL (PF) 1 % IJ SOLN
INTRAMUSCULAR | Status: AC
  Filled 2014-07-05: qty 30

## 2014-07-05 MED ORDER — MIDAZOLAM HCL 2 MG/2ML IJ SOLN
INTRAMUSCULAR | Status: AC
  Filled 2014-07-05: qty 2

## 2014-07-05 MED ORDER — ASPIRIN 81 MG PO CHEW
81.0000 mg | CHEWABLE_TABLET | ORAL | Status: AC
  Administered 2014-07-05: 81 mg via ORAL

## 2014-07-05 MED ORDER — SODIUM CHLORIDE 0.9 % WEIGHT BASED INFUSION: 1.0000 mL/kg/h | INTRAVENOUS | Status: DC

## 2014-07-05 MED ORDER — ACETAMINOPHEN 325 MG PO TABS: 650.0000 mg | ORAL_TABLET | ORAL | Status: DC | PRN

## 2014-07-05 MED ORDER — SODIUM CHLORIDE 0.9 % IV SOLN: 250.0000 mL | INTRAVENOUS | Status: DC | PRN

## 2014-07-05 MED ORDER — SODIUM CHLORIDE 0.9 % IJ SOLN: 3.0000 mL | Freq: Two times a day (BID) | INTRAMUSCULAR | Status: DC

## 2014-07-05 MED ORDER — FENTANYL CITRATE (PF) 100 MCG/2ML IJ SOLN
INTRAMUSCULAR | Status: DC | PRN
  Administered 2014-07-05: 25 ug via INTRAVENOUS

## 2014-07-05 MED ORDER — ONDANSETRON HCL 4 MG/2ML IJ SOLN: 4.0000 mg | Freq: Four times a day (QID) | INTRAMUSCULAR | Status: DC | PRN

## 2014-07-05 MED ORDER — ASPIRIN 81 MG PO CHEW
CHEWABLE_TABLET | ORAL | Status: AC
  Filled 2014-07-05: qty 1

## 2014-07-05 MED ORDER — VERAPAMIL HCL 2.5 MG/ML IV SOLN
INTRAVENOUS | Status: AC
  Filled 2014-07-05: qty 2

## 2014-07-05 MED ORDER — FENTANYL CITRATE (PF) 100 MCG/2ML IJ SOLN
INTRAMUSCULAR | Status: AC
  Filled 2014-07-05: qty 2

## 2014-07-05 MED ORDER — MIDAZOLAM HCL 2 MG/2ML IJ SOLN
INTRAMUSCULAR | Status: DC | PRN
  Administered 2014-07-05: 2 mg via INTRAVENOUS

## 2014-07-05 MED ORDER — HEPARIN SODIUM (PORCINE) 1000 UNIT/ML IJ SOLN
INTRAMUSCULAR | Status: DC | PRN
  Administered 2014-07-05: 4000 [IU] via INTRAVENOUS

## 2014-07-05 MED ORDER — SODIUM CHLORIDE 0.9 % WEIGHT BASED INFUSION
3.0000 mL/kg/h | INTRAVENOUS | Status: AC
  Administered 2014-07-05: 3 mL/kg/h via INTRAVENOUS

## 2014-07-05 MED ORDER — HEPARIN (PORCINE) IN NACL 2-0.9 UNIT/ML-% IJ SOLN
INTRAMUSCULAR | Status: AC
  Filled 2014-07-05: qty 1500

## 2014-07-05 MED ORDER — IOHEXOL 350 MG/ML SOLN
INTRAVENOUS | Status: DC | PRN
  Administered 2014-07-05: 50 mL via INTRA_ARTERIAL

## 2014-07-05 SURGICAL SUPPLY — 14 items

## 2014-07-05 NOTE — Discharge Instructions (Signed)
Radial Site Care °Refer to this sheet in the next few weeks. These instructions provide you with information on caring for yourself after your procedure. Your caregiver may also give you more specific instructions. Your treatment has been planned according to current medical practices, but problems sometimes occur. Call your caregiver if you have any problems or questions after your procedure. °HOME CARE INSTRUCTIONS °· You may shower the day after the procedure. Remove the bandage (dressing) and gently wash the site with plain soap and water. Gently pat the site dry. °· Do not apply powder or lotion to the site. °· Do not submerge the affected site in water for 3 to 5 days. °· Inspect the site at least twice daily. °· Do not flex or bend the affected arm for 24 hours. °· No lifting over 5 pounds (2.3 kg) for 5 days after your procedure. °· Do not drive home if you are discharged the same day of the procedure. Have someone else drive you. °· You may drive 24 hours after the procedure unless otherwise instructed by your caregiver. °· Do not operate machinery or power tools for 24 hours. °· A responsible adult should be with you for the first 24 hours after you arrive home. °What to expect: °· Any bruising will usually fade within 1 to 2 weeks. °· Blood that collects in the tissue (hematoma) may be painful to the touch. It should usually decrease in size and tenderness within 1 to 2 weeks. °SEEK IMMEDIATE MEDICAL CARE IF: °· You have unusual pain at the radial site. °· You have redness, warmth, swelling, or pain at the radial site. °· You have drainage (other than a small amount of blood on the dressing). °· You have chills. °· You have a fever or persistent symptoms for more than 72 hours. °· You have a fever and your symptoms suddenly get worse. °· Your arm becomes pale, cool, tingly, or numb. °· You have heavy bleeding from the site. Hold pressure on the site and call 911. °Document Released: 03/01/2010 Document  Revised: 04/21/2011 Document Reviewed: 03/01/2010 °ExitCare® Patient Information ©2015 [employer]. This information is not intended to replace advice given to you by your health care provider. Make sure you discuss any questions you have with your health care provider. ° °

## 2014-07-05 NOTE — Interval H&P Note (Signed)
History and Physical Interval Note:  07/05/2014 10:34 AM  Kimberly Lin  has presented today for surgery, with the diagnosis of abnormal stress  The various methods of treatment have been discussed with the patient and family. After consideration of risks, benefits and other options for treatment, the patient has consented to  Procedure(s): Left Heart Cath and Coronary Angiography (N/A) as a surgical intervention .  The patient's history has been reviewed, patient examined, no change in status, stable for surgery.  I have reviewed the patient's chart and labs.  Questions were answered to the patient's satisfaction.    Cath Lab Visit (complete for each Cath Lab visit)  Clinical Evaluation Leading to the Procedure:   ACS: No.  Non-ACS:    Anginal Classification: CCS II  Anti-ischemic medical therapy: Maximal Therapy (2 or more classes of medications)  Non-Invasive Test Results: Intermediate-risk stress test findings: cardiac mortality 1-3%/year  Prior CABG: No previous CABG       Sherren Mocha

## 2014-07-05 NOTE — H&P (View-Only) (Signed)
Patient ID: Kimberly Lin, female   DOB: [birth year], 69 y.o.   MRN: [id number]      Cardiology Office Note   Date:  06/28/2014   ID:  Kimberly Lin, DOB 1945-07-31, MRN [id number]  PCP:  Nance Pear., NP  Cardiologist: [name], MD   Chief complain: DOE  History of Present Illness: Kimberly Lin is a 69 y.o. female who presents for the evaluation of dyspnea on exertion. The patient has h/o DM2, hypertension, hyperlipidemia and significant family history of premature CAD - brother has MI at his 68', mother has MI, multiple aunts and uncles had MIs.  She has noticed DOE in the last couple of months and underwent a Lexican nuclear stress test that was abnormal. The patient has occasional palpitations - short lasting with no associated symptoms, no syncopy. She denies chest pain, but feels profoundly tired.  No orthopnea or PND, no LE edema.  She has been experiencing dizziness with rapid motions and fell on her forehead.   Past Medical History  Diagnosis Date  . Arthritis     osteoarthritis  . Fibromyalgia   . History of chicken pox   . Depression   . Diabetes mellitus   . GERD (gastroesophageal reflux disease)   . Hyperlipidemia   . Hypertension   . Migraine   . Personal history of colonic polyps   . History of septic shock 12/2009  . Spinal stenosis   . OSA (obstructive sleep apnea) 03/12/2011  . Hiatal hernia   . Abdominal mass   . Anxiety   . Colitis 2014    Past Surgical History  Procedure Laterality Date  . Breast biopsy Right 2007, 2009  . Appendectomy  1983  . Medial partial knee replacement Bilateral 2006  . Ovarian cyst removal  2008    Pt states she had a ?cyst removed ?ovaries  . Polypectomy  1998  . Colonoscopy  2014    normal      Current Outpatient Prescriptions  Medication Sig Dispense Refill  . ALPRAZolam (XANAX) 1 MG tablet Take 1 tablet (1 mg total) by mouth 2 (two) times daily as needed for anxiety. 60 tablet 0  .  amLODipine (NORVASC) 5 MG tablet Take 1 tablet (5 mg total) by mouth daily. 30 tablet 5  . aspirin 81 MG tablet Take 81 mg by mouth daily.      Marland Kitchen atorvastatin (LIPITOR) 20 MG tablet Take 1 tablet (20 mg total) by mouth daily. 90 tablet 1  . Calcium Carbonate-Vitamin D (CALCIUM-D) 600-400 MG-UNIT TABS Take 1 tablet by mouth daily.     Marland Kitchen co-enzyme Q-10 30 MG capsule Take 100 mg by mouth daily.      . diphenhydrAMINE (BENADRYL) 25 MG tablet Take 25 mg by mouth every 6 (six) hours as needed.    . gabapentin (NEURONTIN) 300 MG capsule Take 300 mg by mouth 2 (two) times daily.    Marland Kitchen glimepiride (AMARYL) 1 MG tablet TAKE 1 TABLET BY MOUTH ONCE DAILY WITH BREAKFAST 90 tablet 1  . losartan (COZAAR) 50 MG tablet Take 1 tablet (50 mg total) by mouth daily. 90 tablet 1  . metFORMIN (GLUCOPHAGE) 500 MG tablet TAKE TWO TABLETS BY MOUTH TWICE DAILY WITH MEALS 120 tablet 5  . metoprolol (LOPRESSOR) 50 MG tablet Take 1 tablet (50 mg total) by mouth 2 (two) times daily. 60 tablet 2  . Multiple Vitamin (MULTIVITAMIN) tablet Take 1 tablet by mouth daily.      Marland Kitchen  naproxen (NAPROSYN) 500 MG tablet Take 1 tablet (500 mg total) by mouth 2 (two) times daily as needed. 60 tablet 0  . nystatin (MYCOSTATIN/NYSTOP) 100000 UNIT/GM POWD Apply bid prn to affected area 30 g 1  . nystatin-triamcinolone ointment (MYCOLOG) Apply 1 application topically 2 (two) times daily. 60 g 0  . Omega-3 Fatty Acids (FISH OIL) 1000 MG CAPS 2 caps by mouth twice daily  0  . pantoprazole (PROTONIX) 40 MG tablet Take 1 tablet (40 mg total) by mouth daily. 90 tablet 1  . pioglitazone (ACTOS) 30 MG tablet Take 1 tablet (30 mg total) by mouth daily. 90 tablet 1  . venlafaxine XR (EFFEXOR-XR) 75 MG 24 hr capsule TAKE TWO CAPSULES BY MOUTH ONCE DAILY 60 capsule 2  . zolpidem (AMBIEN) 10 MG tablet Take 1 tablet (10 mg total) by mouth at bedtime as needed. for sleep 30 tablet 0   No current facility-administered medications for this visit.    Allergies:    Erythromycin and Prednisone    Social History:  The patient  reports that she has quit smoking. Her smoking use included Cigarettes. She has never used smokeless tobacco. She reports that she drinks about 3.6 - 4.8 oz of alcohol per week. She reports that she does not use illicit drugs.   Family History:  The patient's family history includes Arthritis in her mother; Breast cancer in her mother; Diabetes in her brother, maternal aunt, maternal uncle, mother, paternal aunt, paternal uncle, and sister; Heart attack in her maternal aunt, maternal uncle, paternal aunt, and paternal uncle; Heart disease in her brother and mother; Hyperlipidemia in her brother, maternal aunt, maternal uncle, mother, paternal aunt, paternal uncle, and sister; Hypertension in her brother, maternal aunt, maternal uncle, mother, paternal aunt, paternal uncle, and sister; Lung cancer in her brother and father; Prostate cancer in her brother; Rectal cancer in her father; Sudden death in her father.    ROS:  Please see the history of present illness. All other systems are reviewed and negative.    PHYSICAL EXAM: VS:  BP 132/80 mmHg  Pulse 69  Ht 5' 3.5" (1.613 m)  Wt 207 lb (93.895 kg)  BMI 36.09 kg/m2 , BMI Body mass index is 36.09 kg/(m^2). GEN: Well nourished, well developed, in no acute distress HEENT: normal Neck: no JVD, carotid bruits, or masses Cardiac: RRR; no murmurs, rubs, or gallops,no edema  Respiratory:  clear to auscultation bilaterally, normal work of breathing GI: soft, nontender, nondistended, + BS MS: no deformity or atrophy Skin: warm and dry, no rash Neuro:  Strength and sensation are intact Psych: euthymic mood, full affect  EKG: The ekg ordered today demonstrates SR, negative T waves in the anterior leads  Recent Labs: 03/24/2014: ALT 39* 05/16/2014: BUN 20; Creatinine 0.87; Hemoglobin 14.0; Platelets 232.0; Potassium 3.9; Sodium 137; TSH 3.65    Lipid Panel    Component Value Date/Time     CHOL 164 03/24/2014 1450   TRIG 268.0* 03/24/2014 1450   HDL 34.50* 03/24/2014 1450   CHOLHDL 5 03/24/2014 1450   VLDL 53.6* 03/24/2014 1450   LDLCALC 74 06/28/2013 1144   LDLDIRECT 98.0 03/24/2014 1450      Wt Readings from Last 3 Encounters:  06/27/14 207 lb (93.895 kg)  05/25/14 203 lb (92.08 kg)  05/15/14 206 lb (93.441 kg)    Lexiscan nuclear stress test: Exercise Capacity: Lexiscan with low level exercise. BP Response: Normal blood pressure response. Clinical Symptoms: No significant symptoms noted. ECG Impression: No significant  ST segment change suggestive of ischemia. Comparison with Prior Nuclear Study: No images to compare  Overall Impression: Intermediate risk stress nuclear study with medium size, mild severity ischemia in the basal and mid inferior and inferolateral walls (SDS4). .  LV Ejection Fraction: 71%. LV Wall Motion: NL LV Function; NL Wall Motion  Other studies Reviewed: Additional studies/ records that were reviewed today include: TTE 12/15/2012 Study Conclusions  Left ventricle: The cavity size was normal. Wall thickness was increased in a pattern of mild LVH. Systolic function was vigorous. The estimated ejection fraction was in the range of 65% to 70%. Wall motion was normal; there were no regional wall motion abnormalities. Doppler parameters are consistent with abnormal left ventricular relaxation (grade 1 diastolic dysfunction).     ASSESSMENT AND PLAN:  1. DOE - positive stress test - RF - DM2, obesity, HTN, HLP, significant FH of premature CAD, we will schedule a cath  2. HTN - controlled  3. Hyperlipidemia - LDL 98, HDL 34, TG 268, on atorvastatin 20 mg po daily and fish oil, I will await cath result. She needs to be on at least atorvastatin 40 mg po daily to reach goal of LDL < 70, if TG continue to be elevated switch fish oil to fenofibrate.  4. Dizziness - we will oredr carotid US  Labs/ tests ordered today include:    Orders Placed This Encounter  Procedures  . INR/PT  . CBC w/Diff  . Comp Met (CMET)  . EKG 12-Lead   Disposition:   Cath on 09/03/14, follow up in 2 months.  Signed, [name], MD  06/28/2014 12:08 AM    [address], [place], [place]  [id number] Phone: [phone]; Fax: [phone]

## 2014-07-06 MED FILL — Verapamil HCl IV Soln 2.5 MG/ML: INTRAVENOUS | Qty: 2 | Status: AC

## 2014-07-06 MED FILL — Lidocaine HCl Local Preservative Free (PF) Inj 1%: INTRAMUSCULAR | Qty: 30 | Status: AC

## 2014-07-06 MED FILL — Heparin Sodium (Porcine) 2 Unit/ML in Sodium Chloride 0.9%: INTRAMUSCULAR | Qty: 1500 | Status: AC

## 2014-07-13 ENCOUNTER — Other Ambulatory Visit: Payer: Self-pay | Admitting: Family

## 2014-07-14 NOTE — Telephone Encounter (Signed)
Ok to send 60 tabs zero refill 

## 2014-07-21 ENCOUNTER — Other Ambulatory Visit: Payer: Self-pay | Admitting: Family

## 2014-07-21 NOTE — Telephone Encounter (Signed)
Ok to send 30 day supply?  

## 2014-07-24 ENCOUNTER — Telehealth: Payer: Self-pay | Admitting: *Deleted

## 2014-07-24 MED ORDER — VENLAFAXINE HCL ER 75 MG PO CP24: ORAL_CAPSULE | ORAL | Status: DC

## 2014-07-24 NOTE — Telephone Encounter (Signed)
Received fax from [facility] for Lomotil. Spoke with pt re: request. She states she isn't currently having diarrhea but she is getting close to running out of Rx and wanted to have some on hand. Pt states she recently saw GI for 3 months of diarrhea and was determined that pt had some type of colitis and that her rectal muscles had weakened. States she was told to start anti-diarrheal at first sign of diarrhea. Advised pt to request Rx through GI going forward since PCP has not prescribed this since 2014. Pt voices understanding.

## 2014-07-24 NOTE — Telephone Encounter (Signed)
Received fax from [facility] for refill of effexor XR 75mg  2 capsules daily.  Refill sent.

## 2014-07-24 NOTE — Telephone Encounter (Signed)
Zolpidem Rx called to pharmacy voicemail.

## 2014-07-25 ENCOUNTER — Ambulatory Visit (HOSPITAL_COMMUNITY): Payer: Medicare Other | Attending: Cardiology

## 2014-07-25 DIAGNOSIS — R42 Dizziness and giddiness: Secondary | ICD-10-CM

## 2014-07-25 DIAGNOSIS — R0609 Other forms of dyspnea: Secondary | ICD-10-CM | POA: Diagnosis not present

## 2014-07-25 DIAGNOSIS — I6523 Occlusion and stenosis of bilateral carotid arteries: Secondary | ICD-10-CM | POA: Insufficient documentation

## 2014-07-25 DIAGNOSIS — R0600 Dyspnea, unspecified: Secondary | ICD-10-CM

## 2014-07-27 ENCOUNTER — Encounter (HOSPITAL_BASED_OUTPATIENT_CLINIC_OR_DEPARTMENT_OTHER): Payer: Medicare Other

## 2014-07-27 DIAGNOSIS — M545 Low back pain: Secondary | ICD-10-CM | POA: Diagnosis not present

## 2014-07-27 DIAGNOSIS — Z6836 Body mass index (BMI) 36.0-36.9, adult: Secondary | ICD-10-CM | POA: Diagnosis not present

## 2014-07-27 DIAGNOSIS — M4727 Other spondylosis with radiculopathy, lumbosacral region: Secondary | ICD-10-CM | POA: Diagnosis not present

## 2014-08-02 ENCOUNTER — Telehealth: Payer: Self-pay | Admitting: Family

## 2014-08-02 NOTE — Telephone Encounter (Signed)
I would like to re-assess her prior to initiating med.  If she would like to be seen sooner we can try to bring her in sooner.

## 2014-08-02 NOTE — Telephone Encounter (Signed)
[name]-- I see pt was prescribed both cymbalta and lexapro during 2015.  Please advise below request.

## 2014-08-02 NOTE — Telephone Encounter (Signed)
Notified pt. She states she will keep appt as scheduled. Feels like previous medication worked better. Advised pt to let us know if symptoms worsen and we will get her in earlier. Pt voices understanding.

## 2014-08-02 NOTE — Telephone Encounter (Signed)
Caller name: Jalaina Relation to pt: Call back number: [phone] Pharmacy: [facility] on [address]  Reason for call:   Wants to know if she can be written the depression medication(that was prescribed a year ago, does not remember name), not effexor. She wants to start taking this and then see [name] next week for her appointment.

## 2014-08-03 ENCOUNTER — Ambulatory Visit: Payer: Medicare Other | Admitting: Family

## 2014-08-06 ENCOUNTER — Other Ambulatory Visit: Payer: Self-pay | Admitting: Family

## 2014-08-08 ENCOUNTER — Encounter: Payer: Self-pay | Admitting: Family

## 2014-08-08 ENCOUNTER — Ambulatory Visit (INDEPENDENT_AMBULATORY_CARE_PROVIDER_SITE_OTHER): Payer: Medicare Other | Admitting: Family

## 2014-08-08 VITALS — BP 120/88 | HR 82 | Temp 98.6°F | Ht 63.5 in | Wt 204.4 lb

## 2014-08-08 DIAGNOSIS — F329 Major depressive disorder, single episode, unspecified: Secondary | ICD-10-CM

## 2014-08-08 DIAGNOSIS — F32A Depression, unspecified: Secondary | ICD-10-CM

## 2014-08-08 MED ORDER — ESCITALOPRAM OXALATE 10 MG PO TABS: ORAL_TABLET | ORAL | Status: DC

## 2014-08-08 NOTE — Progress Notes (Signed)
Subjective:    Patient ID: Kimberly Lin, female    DOB: 04/20/1945, 69 y.o.   MRN: [id number]  HPI  Kimberly Lin is a 69 yr old female who presents today for follow up.   Depression- reports + stress, selling her home, had to evict a woman who was renting from her.  Not sleeping well, even with ambien.  Reports lack of energy, but looks forward to seeing her good friend.  Denies SI.  Continues effexor.   Since her last visit she had abnormal stress test.  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 Med therapy/lifestyle modification recommended.   Review of Systems See HPI  Past Medical History  Diagnosis Date  . Arthritis     osteoarthritis  . Fibromyalgia   . History of chicken pox   . Depression   . Diabetes mellitus   . GERD (gastroesophageal reflux disease)   . Hyperlipidemia   . Hypertension   . Migraine   . Personal history of colonic polyps   . History of septic shock 12/2009  . Spinal stenosis   . OSA (obstructive sleep apnea) 03/12/2011  . Hiatal hernia   . Abdominal mass   . Anxiety   . Colitis 2014  . Abnormal nuclear stress test 07/05/2014    History   Social History  . Marital Status: Married    Spouse Name: N/A  . Number of Children: 2  . Years of Education: N/A   Occupational History  . Not on file.   Social History Main Topics  . Smoking status: Former Smoker    Types: Cigarettes  . Smokeless tobacco: Never Used     Comment: Smoked socially.  . Alcohol Use: 3.6 - 4.8 oz/week    6-8 Glasses of wine per week  . Drug Use: No  . Sexual Activity: Not on file   Other Topics Concern  . Not on file   Social History Narrative   Regular exercise:  No   Caffeine use: 1 cup daily          Past Surgical History  Procedure Laterality Date  . Breast biopsy Right 2007, 2009  . Appendectomy  1983  . Medial partial knee replacement Bilateral 2006  . Ovarian cyst removal  2008    Pt states she had a  ?cyst removed ?ovaries  . Polypectomy  1998  . Colonoscopy  2014    normal   . Cardiac catheterization N/A 07/05/2014    Procedure: Left Heart Cath and Coronary Angiography;  Surgeon: Sherren Mocha, MD;  Location: [facility];  Service: Cardiovascular;  Laterality: N/A;    Family History  Problem Relation Age of Onset  . Arthritis Mother   . Hyperlipidemia Mother   . Hypertension Mother   . Diabetes Mother   . Heart disease Mother   . Lung cancer Brother   . Heart disease Brother     x 2  . Hypertension Brother   . Hyperlipidemia Brother   . Diabetes Brother   . Sudden death Father   . Diabetes Sister   . Hyperlipidemia Sister   . Hypertension Sister   . Diabetes Maternal Aunt   . Heart attack Maternal Aunt   . Hyperlipidemia Maternal Aunt   . Hypertension Maternal Aunt   . Hypertension Maternal Uncle   . Hyperlipidemia Maternal Uncle   . Heart attack Maternal Uncle   . Diabetes Maternal Uncle   . Diabetes Paternal Aunt   .  Heart attack Paternal Aunt   . Hyperlipidemia Paternal Aunt   . Hypertension Paternal Aunt   . Hypertension Paternal Uncle   . Hyperlipidemia Paternal Uncle   . Heart attack Paternal Uncle   . Diabetes Paternal Uncle   . Breast cancer Mother   . Lung cancer Father   . Rectal cancer Father   . Prostate cancer Brother     Allergies  Allergen Reactions  . Erythromycin Swelling    "all mycins"  . Prednisone Other (See Comments)    "Insomnia"    Current Outpatient Prescriptions on File Prior to Visit  Medication Sig Dispense Refill  . ALPRAZolam (XANAX) 1 MG tablet TAKE ONE TABLET BY MOUTH TWICE DAILY AS NEEDED FOR ANXIETY 60 tablet 0  . amLODipine (NORVASC) 5 MG tablet Take 1 tablet (5 mg total) by mouth daily. 30 tablet 5  . aspirin 81 MG tablet Take 81 mg by mouth daily.      Marland Kitchen atorvastatin (LIPITOR) 20 MG tablet Take 1 tablet (20 mg total) by mouth daily. 90 tablet 1  . Calcium Carbonate-Vitamin D (CALCIUM-D) 600-400 MG-UNIT TABS  Take 1 tablet by mouth daily.     Marland Kitchen co-enzyme Q-10 30 MG capsule Take 100 mg by mouth daily.      . Coenzyme Q10 (CO Q 10) 100 MG CAPS Take 100 mg by mouth daily.    . diphenhydrAMINE (BENADRYL) 25 MG tablet Take 25 mg by mouth every 6 (six) hours as needed for itching.     . gabapentin (NEURONTIN) 300 MG capsule Take 300 mg by mouth 2 (two) times daily.    Marland Kitchen glimepiride (AMARYL) 1 MG tablet TAKE 1 TABLET BY MOUTH ONCE DAILY WITH BREAKFAST (Patient taking differently: Take 1 mg by mouth daily with breakfast. ) 90 tablet 1  . losartan (COZAAR) 50 MG tablet Take 1 tablet (50 mg total) by mouth daily. 90 tablet 1  . metFORMIN (GLUCOPHAGE) 500 MG tablet TAKE TWO TABLETS BY MOUTH TWICE DAILY WITH MEALS 120 tablet 5  . metoprolol (LOPRESSOR) 50 MG tablet Take 1 tablet (50 mg total) by mouth 2 (two) times daily. 60 tablet 2  . Multiple Vitamin (MULTIVITAMIN) tablet Take 1 tablet by mouth daily.      . naproxen (NAPROSYN) 500 MG tablet Take 1 tablet (500 mg total) by mouth 2 (two) times daily as needed. 60 tablet 0  . nystatin (MYCOSTATIN/NYSTOP) 100000 UNIT/GM POWD Apply bid prn to affected area 30 g 1  . nystatin-triamcinolone ointment (MYCOLOG) Apply 1 application topically 2 (two) times daily. 60 g 0  . Omega-3 Fatty Acids (FISH OIL) 1000 MG CAPS 2 caps by mouth twice daily (Patient taking differently: Take 2,000 mg by mouth 2 (two) times daily. )  0  . pantoprazole (PROTONIX) 40 MG tablet Take 1 tablet (40 mg total) by mouth daily. 90 tablet 1  . pioglitazone (ACTOS) 30 MG tablet Take 1 tablet (30 mg total) by mouth daily. 90 tablet 1  . venlafaxine XR (EFFEXOR-XR) 75 MG 24 hr capsule TAKE TWO CAPSULES BY MOUTH ONCE DAILY 60 capsule 0  . zolpidem (AMBIEN) 10 MG tablet TAKE ONE TABLET BY MOUTH AT BEDTIME AS NEEDED FOR SLEEP 30 tablet 0   No current facility-administered medications on file prior to visit.    BP 120/88 mmHg  Pulse 82  Temp(Src) 98.6 F (37 C) (Oral)  Ht 5' 3.5" (1.613 m)  Wt  204 lb 6 oz (92.704 kg)  BMI 35.63 kg/m2  SpO2 92%  Objective:   Physical Exam  Constitutional: She appears well-developed and well-nourished.  Cardiovascular: Normal rate, regular rhythm and normal heart sounds.   No murmur heard. Pulmonary/Chest: Effort normal and breath sounds normal. No respiratory distress. She has no wheezes.  Psychiatric: She has a normal mood and affect. Her behavior is normal. Judgment and thought content normal.          Assessment & Plan:

## 2014-08-08 NOTE — Patient Instructions (Signed)
Add lexapro 10mg .  1/2 tab by mouth once daily for 1 week, then increase to a full tab once daily. Follow up in 1 month.

## 2014-08-08 NOTE — Progress Notes (Signed)
Pre visit review using our clinic review tool, if applicable. No additional management support is needed unless otherwise documented below in the visit note. 

## 2014-08-09 NOTE — Assessment & Plan Note (Signed)
Uncontrolled.  Advised pt:  Add lexapro 10mg .  1/2 tab by mouth once daily for 1 week, then increase to a full tab once daily. Follow up in 1 month.   15 min spent with pt.  >50% of this time was spent counseling pt on depression.

## 2014-08-18 ENCOUNTER — Other Ambulatory Visit: Payer: Self-pay | Admitting: Family

## 2014-08-18 NOTE — Telephone Encounter (Signed)
Patient states that she is out of this medication and is hoping to have this filled today

## 2014-08-18 NOTE — Telephone Encounter (Signed)
Last filled: 07/24/14 Amt: 30, 0 Last OV:  05/15/14  Please advise.

## 2014-08-20 NOTE — Telephone Encounter (Signed)
Ok to send 30 tabs zero refills. 

## 2014-08-21 NOTE — Telephone Encounter (Signed)
Rx called to pharmacy voicemail. 

## 2014-08-22 ENCOUNTER — Other Ambulatory Visit: Payer: Self-pay | Admitting: Family

## 2014-08-23 NOTE — Telephone Encounter (Signed)
Rx faxed to pharmacy  

## 2014-08-23 NOTE — Telephone Encounter (Signed)
Last Rx 07/14/14. Pt has f/u 09/11/14.  Will have pt sign updated CSC and provide UDS at upcoming appt.  Rx printed and forwarded to Provider for signature.

## 2014-08-28 DIAGNOSIS — M4727 Other spondylosis with radiculopathy, lumbosacral region: Secondary | ICD-10-CM | POA: Diagnosis not present

## 2014-08-28 DIAGNOSIS — Z6836 Body mass index (BMI) 36.0-36.9, adult: Secondary | ICD-10-CM | POA: Diagnosis not present

## 2014-08-28 DIAGNOSIS — M545 Low back pain: Secondary | ICD-10-CM | POA: Diagnosis not present

## 2014-08-29 ENCOUNTER — Other Ambulatory Visit: Payer: Self-pay | Admitting: Family

## 2014-08-31 ENCOUNTER — Other Ambulatory Visit: Payer: Self-pay | Admitting: Family

## 2014-09-05 ENCOUNTER — Ambulatory Visit: Payer: Medicare Other | Admitting: Family

## 2014-09-11 ENCOUNTER — Ambulatory Visit (INDEPENDENT_AMBULATORY_CARE_PROVIDER_SITE_OTHER): Payer: Medicare Other | Admitting: Family

## 2014-09-11 ENCOUNTER — Encounter: Payer: Self-pay | Admitting: Family

## 2014-09-11 VITALS — BP 122/80 | HR 76 | Temp 98.1°F | Resp 16 | Ht 63.5 in | Wt 202.4 lb

## 2014-09-11 DIAGNOSIS — F32A Depression, unspecified: Secondary | ICD-10-CM

## 2014-09-11 DIAGNOSIS — F329 Major depressive disorder, single episode, unspecified: Secondary | ICD-10-CM | POA: Diagnosis not present

## 2014-09-11 MED ORDER — ESCITALOPRAM OXALATE 10 MG PO TABS: ORAL_TABLET | ORAL | Status: DC

## 2014-09-11 NOTE — Patient Instructions (Signed)
Continue lexapro. Follow up in 3 months.

## 2014-09-11 NOTE — Progress Notes (Signed)
Subjective:    Patient ID: Kimberly Lin, female    DOB: May 09, 1945, 69 y.o.   MRN: [id number]  HPI  Kimberly Lin is a 69 yr old female who presents today for follow up of her depression. Last visit she reported increased stress, lack of energy.   We added lexapro to her effexor. Reports taht since starting the lexapro she feels much better.   Wt Readings from Last 3 Encounters:  09/11/14 202 lb 6.4 oz (91.808 kg)  08/08/14 204 lb 6 oz (92.704 kg)  07/05/14 190 lb (86.183 kg)    Review of Systems See HPI  Past Medical History  Diagnosis Date  . Arthritis     osteoarthritis  . Fibromyalgia   . History of chicken pox   . Depression   . Diabetes mellitus   . GERD (gastroesophageal reflux disease)   . Hyperlipidemia   . Hypertension   . Migraine   . Personal history of colonic polyps   . History of septic shock 12/2009  . Spinal stenosis   . OSA (obstructive sleep apnea) 03/12/2011  . Hiatal hernia   . Abdominal mass   . Anxiety   . Colitis 2014  . Abnormal nuclear stress test 07/05/2014    History   Social History  . Marital Status: Married    Spouse Name: N/A  . Number of Children: 2  . Years of Education: N/A   Occupational History  . Not on file.   Social History Main Topics  . Smoking status: Former Smoker    Types: Cigarettes  . Smokeless tobacco: Never Used     Comment: Smoked socially.  . Alcohol Use: 3.6 - 4.8 oz/week    6-8 Glasses of wine per week  . Drug Use: No  . Sexual Activity: Not on file   Other Topics Concern  . Not on file   Social History Narrative   Regular exercise:  No   Caffeine use: 1 cup daily          Past Surgical History  Procedure Laterality Date  . Breast biopsy Right 2007, 2009  . Appendectomy  1983  . Medial partial knee replacement Bilateral 2006  . Ovarian cyst removal  2008    Pt states she had a ?cyst removed ?ovaries  . Polypectomy  1998  . Colonoscopy  2014    normal   . Cardiac catheterization N/A  07/05/2014    Procedure: Left Heart Cath and Coronary Angiography;  Surgeon: Sherren Mocha, MD;  Location: [facility];  Service: Cardiovascular;  Laterality: N/A;    Family History  Problem Relation Age of Onset  . Arthritis Mother   . Hyperlipidemia Mother   . Hypertension Mother   . Diabetes Mother   . Heart disease Mother   . Lung cancer Brother   . Heart disease Brother     x 2  . Hypertension Brother   . Hyperlipidemia Brother   . Diabetes Brother   . Sudden death Father   . Diabetes Sister   . Hyperlipidemia Sister   . Hypertension Sister   . Diabetes Maternal Aunt   . Heart attack Maternal Aunt   . Hyperlipidemia Maternal Aunt   . Hypertension Maternal Aunt   . Hypertension Maternal Uncle   . Hyperlipidemia Maternal Uncle   . Heart attack Maternal Uncle   . Diabetes Maternal Uncle   . Diabetes Paternal Aunt   . Heart attack Paternal Aunt   . Hyperlipidemia Paternal  Aunt   . Hypertension Paternal Aunt   . Hypertension Paternal Uncle   . Hyperlipidemia Paternal Uncle   . Heart attack Paternal Uncle   . Diabetes Paternal Uncle   . Breast cancer Mother   . Lung cancer Father   . Rectal cancer Father   . Prostate cancer Brother     Allergies  Allergen Reactions  . Erythromycin Swelling    "all mycins"  . Prednisone Other (See Comments)    "Insomnia"    Current Outpatient Prescriptions on File Prior to Visit  Medication Sig Dispense Refill  . ALPRAZolam (XANAX) 1 MG tablet TAKE ONE TABLET BY MOUTH TWICE DAILY AS NEEDED FOR ANXIETY 60 tablet 0  . amLODipine (NORVASC) 5 MG tablet Take 1 tablet (5 mg total) by mouth daily. 30 tablet 5  . aspirin 81 MG tablet Take 81 mg by mouth daily.      Marland Kitchen atorvastatin (LIPITOR) 20 MG tablet Take 1 tablet (20 mg total) by mouth daily. 90 tablet 1  . Calcium Carbonate-Vitamin D (CALCIUM-D) 600-400 MG-UNIT TABS Take 1 tablet by mouth daily.     . Coenzyme Q10 (CO Q 10) 100 MG CAPS Take 100 mg by mouth daily.    .  diphenhydrAMINE (BENADRYL) 25 MG tablet Take 25 mg by mouth every 6 (six) hours as needed for itching.     . escitalopram (LEXAPRO) 10 MG tablet 1/2 tab by mouth once daily for 1 week, then increase to a full tab once daily on week two 30 tablet 0  . gabapentin (NEURONTIN) 300 MG capsule Take 300 mg by mouth 2 (two) times daily.    Marland Kitchen glimepiride (AMARYL) 1 MG tablet TAKE 1 TABLET BY MOUTH ONCE DAILY WITH BREAKFAST (Patient taking differently: Take 1 mg by mouth daily with breakfast. ) 90 tablet 1  . losartan (COZAAR) 50 MG tablet Take 1 tablet (50 mg total) by mouth daily. 90 tablet 1  . metFORMIN (GLUCOPHAGE) 500 MG tablet TAKE TWO TABLETS BY MOUTH TWICE DAILY WITH MEALS 120 tablet 5  . metoprolol (LOPRESSOR) 50 MG tablet Take 1 tablet (50 mg total) by mouth 2 (two) times daily. 60 tablet 2  . metoprolol (LOPRESSOR) 50 MG tablet TAKE ONE-HALF TABLET BY MOUTH TWICE DAILY 60 tablet 1  . Multiple Vitamin (MULTIVITAMIN) tablet Take 1 tablet by mouth daily.      Marland Kitchen nystatin (MYCOSTATIN/NYSTOP) 100000 UNIT/GM POWD Apply bid prn to affected area 30 g 1  . nystatin-triamcinolone ointment (MYCOLOG) Apply 1 application topically 2 (two) times daily. 60 g 0  . Omega-3 Fatty Acids (FISH OIL) 1000 MG CAPS 2 caps by mouth twice daily (Patient taking differently: Take 2,000 mg by mouth 2 (two) times daily. )  0  . pantoprazole (PROTONIX) 40 MG tablet Take 1 tablet (40 mg total) by mouth daily. 90 tablet 1  . pioglitazone (ACTOS) 30 MG tablet Take 1 tablet (30 mg total) by mouth daily. 90 tablet 1  . venlafaxine XR (EFFEXOR-XR) 75 MG 24 hr capsule TAKE TWO CAPSULES BY MOUTH ONCE DAILY 60 capsule 0  . zolpidem (AMBIEN) 10 MG tablet TAKE ONE TABLET BY MOUTH AT BEDTIME AS NEEDED FOR SLEEP 30 tablet 0   No current facility-administered medications on file prior to visit.    BP 122/80 mmHg  Pulse 76  Temp(Src) 98.1 F (36.7 C) (Oral)  Resp 16  Ht 5' 3.5" (1.613 m)  Wt 202 lb 6.4 oz (91.808 kg)  BMI 35.29 kg/m2   SpO2 98%  Objective:   Physical Exam  Constitutional: She is oriented to person, place, and time. She appears well-developed and well-nourished. No distress.  Neurological: She is alert and oriented to person, place, and time.  Skin: Skin is warm and dry.  Psychiatric: She has a normal mood and affect. Her behavior is normal. Judgment and thought content normal.          Assessment & Plan:  15 minutes spent with pt today. >50% of this time was spent counseling pt on anxiety/depression.

## 2014-09-11 NOTE — Progress Notes (Signed)
Pre visit review using our clinic review tool, if applicable. No additional management support is needed unless otherwise documented below in the visit note. 

## 2014-09-11 NOTE — Assessment & Plan Note (Signed)
Much better now that she is on lexapro. Continue lexapro + effexor.

## 2014-09-21 ENCOUNTER — Ambulatory Visit (INDEPENDENT_AMBULATORY_CARE_PROVIDER_SITE_OTHER): Payer: Medicare Other | Admitting: Cardiology

## 2014-09-21 ENCOUNTER — Encounter: Payer: Self-pay | Admitting: Cardiology

## 2014-09-21 VITALS — BP 130/78 | HR 67 | Ht 63.5 in | Wt 203.4 lb

## 2014-09-21 DIAGNOSIS — I2510 Atherosclerotic heart disease of native coronary artery without angina pectoris: Secondary | ICD-10-CM | POA: Diagnosis not present

## 2014-09-21 DIAGNOSIS — I10 Essential (primary) hypertension: Secondary | ICD-10-CM | POA: Diagnosis not present

## 2014-09-21 DIAGNOSIS — E785 Hyperlipidemia, unspecified: Secondary | ICD-10-CM

## 2014-09-21 DIAGNOSIS — I2583 Coronary atherosclerosis due to lipid rich plaque: Principal | ICD-10-CM

## 2014-09-21 MED ORDER — ATORVASTATIN CALCIUM 40 MG PO TABS: 40.0000 mg | ORAL_TABLET | Freq: Every day | ORAL | Status: DC

## 2014-09-21 NOTE — Patient Instructions (Signed)
Medication Instructions:   INCREASE YOUR ATORVASTATIN TO 40 MG ONCE DAILY    Labwork:  3 MONTHS TO CHECK A CMET AND LIPIDS----PLEASE COME FASTING TO THIS APPOINTMENT     Follow-Up:  Your physician wants you to follow-up in: [place] will receive a reminder letter in the mail two months in advance. If you don't receive a letter, please call our office to schedule the follow-up appointment.'

## 2014-09-21 NOTE — Progress Notes (Signed)
Patient ID: Kimberly Lin, female   DOB: 1945/10/29, 69 y.o.   MRN: [id number]      Cardiology Office Note   Date:  09/21/2014   ID:  Kimberly Lin, DOB 1945/09/21, MRN [id number]  PCP:  Nance Pear., NP  Cardiologist: [name], MD   Chief complain: DOE  History of Present Illness: Kimberly JACUINDE is a 69 y.o. female who presents for the evaluation of dyspnea on exertion. The patient has h/o DM2, hypertension, hyperlipidemia and significant family history of premature CAD - brother has MI at his 101', mother has MI, multiple aunts and uncles had MIs.  She has noticed DOE in the last couple of months and underwent a Lexican nuclear stress test that was abnormal. The patient has occasional palpitations - short lasting with no associated symptoms, no syncopy. She denies chest pain, but feels profoundly tired.  No orthopnea or PND, no LE edema.  She has been experiencing dizziness with rapid motions and fell on her forehead.   09/21/2014 - 3 months follow up, she underwent a left cardiac cath for positive stress test that revealed 40% proximal LAD lesion. Her carotid US showed only mild B/L plaque. Today she states that she feels relived, no CP, no DOE, minimal exercise because of knee pain, no orthopnea, PND, no palpitations, syncope. She has started weight watchers diet in order to loose weight.   Past Medical History  Diagnosis Date  . Arthritis     osteoarthritis  . Fibromyalgia   . History of chicken pox   . Depression   . Diabetes mellitus   . GERD (gastroesophageal reflux disease)   . Hyperlipidemia   . Hypertension   . Migraine   . Personal history of colonic polyps   . History of septic shock 12/2009  . Spinal stenosis   . OSA (obstructive sleep apnea) 03/12/2011  . Hiatal hernia   . Abdominal mass   . Anxiety   . Colitis 2014  . Abnormal nuclear stress test 07/05/2014    Past Surgical History  Procedure Laterality Date  . Breast biopsy Right  2007, 2009  . Appendectomy  1983  . Medial partial knee replacement Bilateral 2006  . Ovarian cyst removal  2008    Pt states she had a ?cyst removed ?ovaries  . Polypectomy  1998  . Colonoscopy  2014    normal   . Cardiac catheterization N/A 07/05/2014    Procedure: Left Heart Cath and Coronary Angiography;  Surgeon: Sherren Mocha, MD;  Location: [facility];  Service: Cardiovascular;  Laterality: N/A;     Current Outpatient Prescriptions  Medication Sig Dispense Refill  . ALPRAZolam (XANAX) 1 MG tablet TAKE ONE TABLET BY MOUTH TWICE DAILY AS NEEDED FOR ANXIETY 60 tablet 0  . amLODipine (NORVASC) 5 MG tablet Take 1 tablet (5 mg total) by mouth daily. 30 tablet 5  . aspirin 81 MG tablet Take 81 mg by mouth daily.      Marland Kitchen atorvastatin (LIPITOR) 20 MG tablet Take 1 tablet (20 mg total) by mouth daily. 90 tablet 1  . Calcium Carbonate-Vitamin D (CALCIUM-D) 600-400 MG-UNIT TABS Take 1 tablet by mouth daily.     . Coenzyme Q10 (CO Q 10) 100 MG CAPS Take 100 mg by mouth daily.    . diphenhydrAMINE (BENADRYL) 25 MG tablet Take 25 mg by mouth every 6 (six) hours as needed for itching.     . escitalopram (LEXAPRO) 10 MG tablet 1 tab by  mouth once daily. 30 tablet 3  . gabapentin (NEURONTIN) 300 MG capsule Take 300 mg by mouth 2 (two) times daily.    Marland Kitchen glimepiride (AMARYL) 1 MG tablet TAKE 1 TABLET BY MOUTH ONCE DAILY WITH BREAKFAST (Patient taking differently: Take 1 mg by mouth daily with breakfast. ) 90 tablet 1  . losartan (COZAAR) 50 MG tablet Take 1 tablet (50 mg total) by mouth daily. 90 tablet 1  . metFORMIN (GLUCOPHAGE) 500 MG tablet TAKE TWO TABLETS BY MOUTH TWICE DAILY WITH MEALS 120 tablet 5  . metoprolol (LOPRESSOR) 50 MG tablet Take 1 tablet (50 mg total) by mouth 2 (two) times daily. 60 tablet 2  . Multiple Vitamin (MULTIVITAMIN) tablet Take 1 tablet by mouth daily.      . Omega-3 Fatty Acids (FISH OIL) 1000 MG CAPS 2 caps by mouth twice daily (Patient taking differently: Take  2,000 mg by mouth 2 (two) times daily. )  0  . pantoprazole (PROTONIX) 40 MG tablet Take 1 tablet (40 mg total) by mouth daily. 90 tablet 1  . pioglitazone (ACTOS) 30 MG tablet Take 1 tablet (30 mg total) by mouth daily. 90 tablet 1  . venlafaxine XR (EFFEXOR-XR) 75 MG 24 hr capsule TAKE TWO CAPSULES BY MOUTH ONCE DAILY 60 capsule 0  . zolpidem (AMBIEN) 10 MG tablet TAKE ONE TABLET BY MOUTH AT BEDTIME AS NEEDED FOR SLEEP 30 tablet 0   No current facility-administered medications for this visit.    Allergies:   Erythromycin and Prednisone    Social History:  The patient  reports that she has quit smoking. Her smoking use included Cigarettes. She has never used smokeless tobacco. She reports that she drinks about 3.6 - 4.8 oz of alcohol per week. She reports that she does not use illicit drugs.   Family History:  The patient's family history includes Arthritis in her mother; Breast cancer in her mother; Diabetes in her brother, maternal aunt, maternal uncle, mother, paternal aunt, paternal uncle, and sister; Heart attack in her maternal aunt, maternal uncle, paternal aunt, and paternal uncle; Heart disease in her brother and mother; Hyperlipidemia in her brother, maternal aunt, maternal uncle, mother, paternal aunt, paternal uncle, and sister; Hypertension in her brother, maternal aunt, maternal uncle, mother, paternal aunt, paternal uncle, and sister; Lung cancer in her brother and father; Prostate cancer in her brother; Rectal cancer in her father; Sudden death in her father.    ROS:  Please see the history of present illness. All other systems are reviewed and negative.    PHYSICAL EXAM: VS:  BP 130/78 mmHg  Pulse 67  Ht 5' 3.5" (1.613 m)  Wt 203 lb 6.4 oz (92.262 kg)  BMI 35.46 kg/m2 , BMI Body mass index is 35.46 kg/(m^2). GEN: Well nourished, well developed, in no acute distress HEENT: normal Neck: no JVD, carotid bruits, or masses Cardiac: RRR; no murmurs, rubs, or gallops,no edema    Respiratory:  clear to auscultation bilaterally, normal work of breathing GI: soft, nontender, nondistended, + BS MS: no deformity or atrophy Skin: warm and dry, no rash Neuro:  Strength and sensation are intact Psych: euthymic mood, full affect  EKG: The ekg ordered today demonstrates SR, negative T waves in the anterior leads  Recent Labs: 05/16/2014: TSH 3.65 07/03/2014: ALT 17; BUN 14; Creatinine, Ser 0.85; Hemoglobin 13.7; Platelets 211.0; Potassium 4.3; Sodium 135    Lipid Panel    Component Value Date/Time   CHOL 164 03/24/2014 1450   TRIG 268.0* 03/24/2014  1450   HDL 34.50* 03/24/2014 1450   CHOLHDL 5 03/24/2014 1450   VLDL 53.6* 03/24/2014 1450   LDLCALC 74 06/28/2013 1144   LDLDIRECT 98.0 03/24/2014 1450      Wt Readings from Last 3 Encounters:  09/21/14 203 lb 6.4 oz (92.262 kg)  09/11/14 202 lb 6.4 oz (91.808 kg)  08/08/14 204 lb 6 oz (92.704 kg)    Lexiscan nuclear stress test: Exercise Capacity: Lexiscan with low level exercise. BP Response: Normal blood pressure response. Clinical Symptoms: No significant symptoms noted. ECG Impression: No significant ST segment change suggestive of ischemia. Comparison with Prior Nuclear Study: No images to compare  Overall Impression: Intermediate risk stress nuclear study with medium size, mild severity ischemia in the basal and mid inferior and inferolateral walls (SDS4). .  LV Ejection Fraction: 71%. LV Wall Motion: NL LV Function; NL Wall Motion  Other studies Reviewed: Additional studies/ records that were reviewed today include: TTE 12/15/2012 Study Conclusions  Left ventricle: The cavity size was normal. Wall thickness was increased in a pattern of mild LVH. Systolic function was vigorous. The estimated ejection fraction was in the range of 65% to 70%. Wall motion was normal; there were no regional wall motion abnormalities. Doppler parameters are consistent with abnormal left ventricular relaxation  (grade 1 diastolic dysfunction).   Cardiac cath: 07/05/2014 IMPRESSION:  NONOBSTRUCTIVE CAD INVOLVING THE PROXIMAL LAD  NORMAL LVEDP  RECOMMENDATION:  MEDICAL THERAPY, LIFESTYLE MODIFICATION    Coronary Findings    Dominance: Right   Left Anterior Descending   . Prox LAD lesion, 40% stenosed. Diffuse 40-50% stenosis of the proximal LAD, does not appear to be flow-obstructive     Left Circumflex  The vessel is angiographically normal.   . First Obtuse Marginal Branch   The vessel is angiographically normal.     Right Coronary Artery  The vessel exhibits minimal luminal irregularities.   . Right Posterior Descending Artery   The vessel exhibits minimal luminal irregularities.     Carotid US: 07/25/2014 Heterogeneous plaque, bilaterally. 1-39% bilateral ICA stenosis. (stable) Normal subclavian arteries, bilaterally. Patent vertebral arteries with antegrade flow. f/u PRN    ASSESSMENT AND PLAN:  1. DOE - positive stress test - RF - DM2, obesity, HTN, HLP, significant FH of premature CAD, cath showed moderate non-obstructive CAD.  We will increase atorvastatin to 40 mg po daily.  2. HTN - controlled  3. Hyperlipidemia - LDL 98, HDL 34, TG 268, on atorvastatin 20 mg po daily and fish oil, we will increase to atorvastatin 40 mg po daily. We will check lipids in 3 months with CMP.  4. Dizziness - 1-39% B/L carotid stenosis on carotid US  Follow up in 1 year.  Signed, [name], MD  09/21/2014 2:51 PM    [facility] [place], [place], [place]  [address] Phone: [phone]; Fax: [phone]

## 2014-09-21 NOTE — Addendum Note (Signed)
Addended by: Nuala Alpha on: 09/21/2014 03:25 PM   Modules accepted: Orders, Medications

## 2014-09-29 ENCOUNTER — Other Ambulatory Visit: Payer: Self-pay | Admitting: Family

## 2014-09-30 NOTE — Telephone Encounter (Signed)
Ok to send 60 tabs zero refills. 

## 2014-10-02 NOTE — Telephone Encounter (Signed)
Rx called to pharmacy voicemail. 

## 2014-10-04 ENCOUNTER — Other Ambulatory Visit: Payer: Self-pay | Admitting: Family

## 2014-10-12 ENCOUNTER — Telehealth: Payer: Self-pay | Admitting: Family

## 2014-10-12 DIAGNOSIS — E119 Type 2 diabetes mellitus without complications: Secondary | ICD-10-CM

## 2014-10-12 NOTE — Telephone Encounter (Signed)
Caller name: Dillyn Joaquin  Relationship to patient: Self  Can be reached: [phone]  Pharmacy:  Reason for call: pt called in requesting a referral to an eye dr. She says that it has been 3 years.    Please advise.

## 2014-10-12 NOTE — Telephone Encounter (Signed)
Please advise 

## 2014-10-13 NOTE — Telephone Encounter (Signed)
Referral was placed. Pt requests to see someone in the [facility] area. Referral request updated.

## 2014-10-19 DIAGNOSIS — H43811 Vitreous degeneration, right eye: Secondary | ICD-10-CM | POA: Diagnosis not present

## 2014-10-19 DIAGNOSIS — H524 Presbyopia: Secondary | ICD-10-CM | POA: Diagnosis not present

## 2014-10-19 DIAGNOSIS — H2513 Age-related nuclear cataract, bilateral: Secondary | ICD-10-CM | POA: Diagnosis not present

## 2014-10-19 DIAGNOSIS — H5203 Hypermetropia, bilateral: Secondary | ICD-10-CM | POA: Diagnosis not present

## 2014-10-19 DIAGNOSIS — E119 Type 2 diabetes mellitus without complications: Secondary | ICD-10-CM | POA: Diagnosis not present

## 2014-10-19 LAB — HM DIABETES EYE EXAM

## 2014-10-27 ENCOUNTER — Other Ambulatory Visit: Payer: Self-pay | Admitting: Family

## 2014-10-31 DIAGNOSIS — M545 Low back pain: Secondary | ICD-10-CM | POA: Diagnosis not present

## 2014-11-07 ENCOUNTER — Other Ambulatory Visit: Payer: Self-pay | Admitting: Family

## 2014-11-08 NOTE — Telephone Encounter (Signed)
Last alprazolam Rx 10/02/14, #60.  Pt has f/u 12/12/14 and will need to complete CSC and UDS at that visit.  Rx printed and forwarded to PCP for signature.

## 2014-11-08 NOTE — Telephone Encounter (Signed)
Rx faxed to pharmacy  

## 2014-11-13 ENCOUNTER — Other Ambulatory Visit: Payer: Self-pay | Admitting: Family

## 2014-11-13 NOTE — Telephone Encounter (Signed)
Alprazolam Rx was faxed to pharmacy on 11/08/14 (see previous refill note). Spoke with Alvie Heidelberg at [facility], she has no record of receiving our fax on 11/08/14. Spoke with pharmacist, Joe and gave verbal per 11/08/14 refill note.

## 2014-11-15 ENCOUNTER — Ambulatory Visit (INDEPENDENT_AMBULATORY_CARE_PROVIDER_SITE_OTHER): Payer: Medicare Other

## 2014-11-15 DIAGNOSIS — Z23 Encounter for immunization: Secondary | ICD-10-CM | POA: Diagnosis not present

## 2014-11-28 ENCOUNTER — Telehealth: Payer: Self-pay | Admitting: Family

## 2014-11-28 NOTE — Telephone Encounter (Signed)
Unable to reach patient at this time. Left a detailed message regarding the medication below. Patient has been advised to return call if there are  any further questions or concerns.

## 2014-11-28 NOTE — Telephone Encounter (Signed)
Relation to JQ:ZESP Call back number:[phone] Pharmacy: [facility] [place], [place].  Reason for call:  Patient in need of clarification metoprolol (LOPRESSOR) 50 MG tablet regarding the frequency.

## 2014-12-05 ENCOUNTER — Other Ambulatory Visit: Payer: Self-pay | Admitting: Family

## 2014-12-05 ENCOUNTER — Encounter (HOSPITAL_BASED_OUTPATIENT_CLINIC_OR_DEPARTMENT_OTHER): Payer: Self-pay | Admitting: *Deleted

## 2014-12-05 ENCOUNTER — Emergency Department (HOSPITAL_BASED_OUTPATIENT_CLINIC_OR_DEPARTMENT_OTHER)
Admission: EM | Admit: 2014-12-05 | Discharge: 2014-12-05 | Disposition: A | Discharge status: Home / Self Care | Payer: Medicare Other | Attending: Emergency Medicine | Admitting: Emergency Medicine

## 2014-12-05 DIAGNOSIS — M797 Fibromyalgia: Secondary | ICD-10-CM | POA: Diagnosis not present

## 2014-12-05 DIAGNOSIS — E119 Type 2 diabetes mellitus without complications: Secondary | ICD-10-CM | POA: Insufficient documentation

## 2014-12-05 DIAGNOSIS — Z87891 Personal history of nicotine dependence: Secondary | ICD-10-CM | POA: Insufficient documentation

## 2014-12-05 DIAGNOSIS — M1990 Unspecified osteoarthritis, unspecified site: Secondary | ICD-10-CM | POA: Diagnosis not present

## 2014-12-05 DIAGNOSIS — Z8619 Personal history of other infectious and parasitic diseases: Secondary | ICD-10-CM | POA: Diagnosis not present

## 2014-12-05 DIAGNOSIS — E785 Hyperlipidemia, unspecified: Secondary | ICD-10-CM | POA: Diagnosis not present

## 2014-12-05 DIAGNOSIS — K219 Gastro-esophageal reflux disease without esophagitis: Secondary | ICD-10-CM | POA: Diagnosis not present

## 2014-12-05 DIAGNOSIS — Z7982 Long term (current) use of aspirin: Secondary | ICD-10-CM | POA: Diagnosis not present

## 2014-12-05 DIAGNOSIS — Z79899 Other long term (current) drug therapy: Secondary | ICD-10-CM | POA: Diagnosis not present

## 2014-12-05 DIAGNOSIS — Z86010 Personal history of colonic polyps: Secondary | ICD-10-CM | POA: Insufficient documentation

## 2014-12-05 DIAGNOSIS — F419 Anxiety disorder, unspecified: Secondary | ICD-10-CM | POA: Insufficient documentation

## 2014-12-05 DIAGNOSIS — I10 Essential (primary) hypertension: Secondary | ICD-10-CM | POA: Diagnosis not present

## 2014-12-05 DIAGNOSIS — G43909 Migraine, unspecified, not intractable, without status migrainosus: Secondary | ICD-10-CM | POA: Insufficient documentation

## 2014-12-05 DIAGNOSIS — R42 Dizziness and giddiness: Secondary | ICD-10-CM | POA: Diagnosis not present

## 2014-12-05 LAB — CBC WITH DIFFERENTIAL/PLATELET
BASOS ABS: 0 10*3/uL (ref 0.0–0.1)
BASOS PCT: 0 %
Eosinophils Absolute: 0.2 10*3/uL (ref 0.0–0.7)
Eosinophils Relative: 3 %
HEMATOCRIT: 38.6 % (ref 36.0–46.0)
HEMOGLOBIN: 13.1 g/dL (ref 12.0–15.0)
Lymphocytes Relative: 18 %
Lymphs Abs: 1.4 10*3/uL (ref 0.7–4.0)
MCH: 31.3 pg (ref 26.0–34.0)
MCHC: 33.9 g/dL (ref 30.0–36.0)
MCV: 92.1 fL (ref 78.0–100.0)
MONOS PCT: 17 %
Monocytes Absolute: 1.3 10*3/uL — ABNORMAL HIGH (ref 0.1–1.0)
NEUTROS ABS: 4.8 10*3/uL (ref 1.7–7.7)
NEUTROS PCT: 62 %
Platelets: 202 10*3/uL (ref 150–400)
RBC: 4.19 MIL/uL (ref 3.87–5.11)
RDW: 13.7 % (ref 11.5–15.5)
WBC: 7.8 10*3/uL (ref 4.0–10.5)

## 2014-12-05 LAB — BASIC METABOLIC PANEL
ANION GAP: 9 (ref 5–15)
BUN: 16 mg/dL (ref 6–20)
CHLORIDE: 100 mmol/L — AB (ref 101–111)
CO2: 25 mmol/L (ref 22–32)
Calcium: 8.7 mg/dL — ABNORMAL LOW (ref 8.9–10.3)
Creatinine, Ser: 1.13 mg/dL — ABNORMAL HIGH (ref 0.44–1.00)
GFR calc non Af Amer: 49 mL/min — ABNORMAL LOW (ref >60)
GFR, EST AFRICAN AMERICAN: 57 mL/min — AB (ref >60)
Glucose, Bld: 74 mg/dL (ref 65–99)
POTASSIUM: 4 mmol/L (ref 3.5–5.1)
Sodium: 134 mmol/L — ABNORMAL LOW (ref 135–145)

## 2014-12-05 LAB — CBG MONITORING, ED: Glucose-Capillary: 60 mg/dL — ABNORMAL LOW (ref 65–99)

## 2014-12-05 MED ORDER — MECLIZINE HCL 25 MG PO TABS
25.0000 mg | ORAL_TABLET | Freq: Once | ORAL | Status: AC
  Administered 2014-12-05: 25 mg via ORAL
  Filled 2014-12-05: qty 1

## 2014-12-05 MED ORDER — MECLIZINE HCL 50 MG PO TABS: 50.0000 mg | ORAL_TABLET | Freq: Three times a day (TID) | ORAL | Status: DC | PRN

## 2014-12-05 NOTE — ED Provider Notes (Signed)
CSN: [id number]     Arrival date & time 12/05/14  1632 History   First MD Initiated Contact with Patient 12/05/14 1653     Chief Complaint  Patient presents with  . Dizziness     (Consider location/radiation/quality/duration/timing/severity/associated sxs/prior Treatment) HPI Comments: Patient with history of vertigo, diabetes, no previous strokes -- presents with complaint of dizziness described as a spinning sensation and disequilibrium over the past 4 days. Symptoms have been constant. She has had difficulty walking and fell several times. She also describes some lightheadedness but no near syncope or full syncope. Dizziness is worse when she sits up or moves her head side-to-side. She denies head injury, headache, vomiting. Patient denies signs of stroke including: facial droop, slurred speech, aphasia, weakness/numbness in extremities. She denies other medical complaints. No treatments prior to arrival.   The history is provided by the patient.    Past Medical History  Diagnosis Date  . Arthritis     osteoarthritis  . Fibromyalgia   . History of chicken pox   . Depression   . Diabetes mellitus   . GERD (gastroesophageal reflux disease)   . Hyperlipidemia   . Hypertension   . Migraine   . Personal history of colonic polyps   . History of septic shock 12/2009  . Spinal stenosis   . OSA (obstructive sleep apnea) 03/12/2011  . Hiatal hernia   . Abdominal mass   . Anxiety   . Colitis 2014  . Abnormal nuclear stress test 07/05/2014   Past Surgical History  Procedure Laterality Date  . Breast biopsy Right 2007, 2009  . Appendectomy  1983  . Medial partial knee replacement Bilateral 2006  . Ovarian cyst removal  2008    Pt states she had a ?cyst removed ?ovaries  . Polypectomy  1998  . Colonoscopy  2014    normal   . Cardiac catheterization N/A 07/05/2014    Procedure: Left Heart Cath and Coronary Angiography;  Surgeon: Sherren Mocha, MD;  Location: [facility];   Service: Cardiovascular;  Laterality: N/A;   Family History  Problem Relation Age of Onset  . Arthritis Mother   . Hyperlipidemia Mother   . Hypertension Mother   . Diabetes Mother   . Heart disease Mother   . Lung cancer Brother   . Heart disease Brother     x 2  . Hypertension Brother   . Hyperlipidemia Brother   . Diabetes Brother   . Sudden death Father   . Diabetes Sister   . Hyperlipidemia Sister   . Hypertension Sister   . Diabetes Maternal Aunt   . Heart attack Maternal Aunt   . Hyperlipidemia Maternal Aunt   . Hypertension Maternal Aunt   . Hypertension Maternal Uncle   . Hyperlipidemia Maternal Uncle   . Heart attack Maternal Uncle   . Diabetes Maternal Uncle   . Diabetes Paternal Aunt   . Heart attack Paternal Aunt   . Hyperlipidemia Paternal Aunt   . Hypertension Paternal Aunt   . Hypertension Paternal Uncle   . Hyperlipidemia Paternal Uncle   . Heart attack Paternal Uncle   . Diabetes Paternal Uncle   . Breast cancer Mother   . Lung cancer Father   . Rectal cancer Father   . Prostate cancer Brother    Social History  Substance Use Topics  . Smoking status: Former Smoker    Types: Cigarettes  . Smokeless tobacco: Never Used     Comment: Smoked socially.  Marland Kitchen  Alcohol Use: 3.6 - 4.8 oz/week    6-8 Glasses of wine per week   OB History    No data available     Review of Systems  Constitutional: Negative for fever.  HENT: Negative for congestion, dental problem, rhinorrhea and sinus pressure.   Eyes: Negative for photophobia, discharge, redness and visual disturbance.  Respiratory: Negative for shortness of breath.   Cardiovascular: Negative for chest pain.  Gastrointestinal: Negative for nausea and vomiting.  Musculoskeletal: Positive for gait problem (imbalance). Negative for neck pain and neck stiffness.  Skin: Negative for rash.  Neurological: Positive for dizziness. Negative for syncope, facial asymmetry, speech difficulty, weakness,  light-headedness, numbness and headaches.  Psychiatric/Behavioral: Negative for confusion.    Allergies  Erythromycin and Prednisone  Home Medications   Prior to Admission medications   Medication Sig Start Date End Date Taking? Authorizing Provider  ALPRAZolam Duanne Moron) 1 MG tablet TAKE ONE TABLET BY MOUTH TWICE DAILY AS NEEDED FOR ANXIETY 11/08/14   Debbrah Alar, NP  amLODipine (NORVASC) 5 MG tablet Take 1 tablet (5 mg total) by mouth daily. 06/12/14   Debbrah Alar, NP  aspirin 81 MG tablet Take 81 mg by mouth daily.      Historical Provider, MD  atorvastatin (LIPITOR) 40 MG tablet Take 1 tablet (40 mg total) by mouth daily. 09/21/14   [name], MD  Calcium Carbonate-Vitamin D (CALCIUM-D) 600-400 MG-UNIT TABS Take 1 tablet by mouth daily.     Historical Provider, MD  Coenzyme Q10 (CO Q 10) 100 MG CAPS Take 100 mg by mouth daily.    Historical Provider, MD  diphenhydrAMINE (BENADRYL) 25 MG tablet Take 25 mg by mouth every 6 (six) hours as needed for itching.     Historical Provider, MD  escitalopram (LEXAPRO) 10 MG tablet 1 tab by mouth once daily. 09/11/14   Debbrah Alar, NP  gabapentin (NEURONTIN) 300 MG capsule Take 300 mg by mouth 2 (two) times daily. 10/26/13   Historical Provider, MD  glimepiride (AMARYL) 1 MG tablet TAKE 1 TABLET BY MOUTH ONCE DAILY WITH BREAKFAST Patient taking differently: Take 1 mg by mouth daily with breakfast.  05/26/14   Debbrah Alar, NP  losartan (COZAAR) 50 MG tablet Take 1 tablet (50 mg total) by mouth daily. 05/26/14   Debbrah Alar, NP  metFORMIN (GLUCOPHAGE) 500 MG tablet TAKE TWO TABLETS BY MOUTH TWICE DAILY WITH  MEALS 10/27/14   Debbrah Alar, NP  metoprolol (LOPRESSOR) 50 MG tablet Take 1 tablet (50 mg total) by mouth 2 (two) times daily. 03/24/14   Debbrah Alar, NP  Multiple Vitamin (MULTIVITAMIN) tablet Take 1 tablet by mouth daily.      Historical Provider, MD  Omega-3 Fatty Acids (FISH OIL) 1000 MG CAPS 2 caps  by mouth twice daily Patient taking differently: Take 2,000 mg by mouth 2 (two) times daily.  04/16/11   Debbrah Alar, NP  pantoprazole (PROTONIX) 40 MG tablet Take 1 tablet (40 mg total) by mouth daily. 05/26/14   Debbrah Alar, NP  pioglitazone (ACTOS) 30 MG tablet Take 1 tablet (30 mg total) by mouth daily. 05/26/14   Debbrah Alar, NP  venlafaxine XR (EFFEXOR-XR) 75 MG 24 hr capsule Take 2 capsules by mouth once a day. 10/05/14   Debbrah Alar, NP  zolpidem (AMBIEN) 10 MG tablet TAKE ONE TABLET BY MOUTH AT BEDTIME AS NEEDED FOR SLEEP 08/21/14   Debbrah Alar, NP   BP 150/92 mmHg  Pulse 88  Temp(Src) 98.6 F (37 C) (Oral)  Resp 18  Ht 5' 3.5" (1.613 m)  Wt 182 lb (82.555 kg)  BMI 31.73 kg/m2  SpO2 98%   Physical Exam  Constitutional: She is oriented to person, place, and time. She appears well-developed and well-nourished.  HENT:  Head: Normocephalic and atraumatic.  Right Ear: Tympanic membrane, external ear and ear canal normal.  Left Ear: Tympanic membrane, external ear and ear canal normal.  Nose: Nose normal.  Mouth/Throat: Uvula is midline, oropharynx is clear and moist and mucous membranes are normal.  Eyes: Conjunctivae, EOM and lids are normal. Pupils are equal, round, and reactive to light. Right eye exhibits no nystagmus. Left eye exhibits no nystagmus.  Horizontal nystagmus with fast phase to right noted.  Neck: Normal range of motion. Neck supple.  Cardiovascular: Normal rate and regular rhythm.   Pulmonary/Chest: Effort normal and breath sounds normal.  Abdominal: Soft. There is no tenderness.  Musculoskeletal:       Cervical back: She exhibits normal range of motion, no tenderness and no bony tenderness.  Neurological: She is alert and oriented to person, place, and time. She has normal strength and normal reflexes. No cranial nerve deficit or sensory deficit. She displays a negative Romberg sign. Coordination and gait normal. GCS eye subscore is  4. GCS verbal subscore is 5. GCS motor subscore is 6.  Patient can ambulate without assistance.  Skin: Skin is warm and dry.  Psychiatric: She has a normal mood and affect.  Nursing note and vitals reviewed.   ED Course  Procedures (including critical care time) Labs Review Labs Reviewed  CBC WITH DIFFERENTIAL/PLATELET - Abnormal; Notable for the following:    Monocytes Absolute 1.3 (*)    All other components within normal limits  BASIC METABOLIC PANEL - Abnormal; Notable for the following:    Sodium 134 (*)    Chloride 100 (*)    Creatinine, Ser 1.13 (*)    Calcium 8.7 (*)    GFR calc non Af Amer 49 (*)    GFR calc Af Amer 57 (*)    All other components within normal limits  CBG MONITORING, ED - Abnormal; Notable for the following:    Glucose-Capillary 60 (*)    All other components within normal limits  CBG MONITORING, ED    Imaging Review No results found. I have personally reviewed and evaluated these images and lab results as part of my medical decision-making.   EKG Interpretation None      5:10 PM Patient seen and examined. Work-up initiated. Medications ordered.   Vital signs reviewed and are as follows: BP 150/92 mmHg  Pulse 88  Temp(Src) 98.6 F (37 C) (Oral)  Resp 18  Ht 5' 3.5" (1.613 m)  Wt 182 lb (82.555 kg)  BMI 31.73 kg/m2  SpO2 98%  5:10 PM Patient was discussed with and seen by Merrily Pew, MD.  7:45 PM Patient is feeling better. Workup is reassuring. She feels better with meclizine. Will discharge home with PCP follow-up.  Patient counseled to return if they have weakness in their arms or legs, slurred speech, trouble walking or talking, confusion, trouble with their balance, or if they have any other concerns. Patient verbalizes understanding and agrees with plan.    MDM   Final diagnoses:  Vertigo   Patient with signs and symptoms consistent with peripheral vertigo. She is clinically improved in emergency department with meclizine.  She is ambulatory. Lab workup is reassuring. Do not feel that MRI is indicated given her minor symptoms at  this time. PCP follow-up as needed. Encouraged to return immediately with any stroke signs or symptoms.    Carlisle Cater, PA-C 12/05/14 1946  Merrily Pew, MD 12/05/14 2230

## 2014-12-05 NOTE — ED Notes (Signed)
Pt given orange juice.

## 2014-12-05 NOTE — Discharge Instructions (Signed)
Please read and follow all provided instructions.  Your diagnoses today include:  1. Vertigo     Tests performed today include:  Blood counts and electrolytes - normal   EKG - no concerns  Vital signs. See below for your results today.   Medications prescribed:   Meclizine - medication for dizziness  Take any prescribed medications only as directed.  Home care instructions:  Follow any educational materials contained in this packet.  Follow-up instructions: Please follow-up with your primary care provider in the next 3 days for further evaluation of your symptoms.   Return instructions:  SEEK IMMEDIATE MEDICAL ATTENTION IF:  There is confusion or drowsiness.   You have more than one episode of vomiting.   You notice dizziness or unsteadiness which is getting worse, or inability to walk.   You have convulsions or unconsciousness.   You experience severe, persistent headaches not relieved by Tylenol.  You cannot use arms or legs normally.   There are changes in pupil sizes. (This is the black center in the colored part of the eye)   You have change in speech, vision, swallowing, or understanding.   Localized weakness, numbness, tingling, or change in bowel or bladder control.  You have any other emergent concerns.  Additional Information: You have had a head injury which does not appear to require admission at this time.  Your vital signs today were: BP 133/84 mmHg   Pulse 89   Temp(Src) 98.6 F (37 C) (Oral)   Resp 18   Ht 5' 3.5" (1.613 m)   Wt 182 lb (82.555 kg)   BMI 31.73 kg/m2   SpO2 97% If your blood pressure (BP) was elevated above 135/85 this visit, please have this repeated by your doctor within one month. --------------

## 2014-12-05 NOTE — ED Notes (Signed)
Patient verbalizes understanding of discharge instructions and medications.  Patient stable and ambulatory with steady gait.

## 2014-12-05 NOTE — ED Notes (Signed)
Dizziness x 3 days. States she has had multiple falls. Bruising noted to her arms. Pain in her left ribs. Not sleeping.

## 2014-12-06 NOTE — Telephone Encounter (Signed)
Pt has f/u 12/12/14. Last zolpidem Rx 08/21/14.  Rx printed and forwarded to PCP for signature.

## 2014-12-06 NOTE — Telephone Encounter (Signed)
Rx faxed to pharmacy at 3:15pm. 

## 2014-12-12 ENCOUNTER — Other Ambulatory Visit: Payer: Self-pay | Admitting: Family

## 2014-12-12 ENCOUNTER — Ambulatory Visit (INDEPENDENT_AMBULATORY_CARE_PROVIDER_SITE_OTHER): Payer: Medicare Other | Admitting: Family

## 2014-12-12 ENCOUNTER — Encounter: Payer: Self-pay | Admitting: Family

## 2014-12-12 VITALS — BP 124/66 | HR 73 | Temp 99.0°F | Resp 18 | Ht 63.5 in | Wt 198.2 lb

## 2014-12-12 DIAGNOSIS — F329 Major depressive disorder, single episode, unspecified: Secondary | ICD-10-CM | POA: Diagnosis not present

## 2014-12-12 DIAGNOSIS — I2510 Atherosclerotic heart disease of native coronary artery without angina pectoris: Secondary | ICD-10-CM

## 2014-12-12 DIAGNOSIS — Z79899 Other long term (current) drug therapy: Secondary | ICD-10-CM | POA: Diagnosis not present

## 2014-12-12 DIAGNOSIS — G4733 Obstructive sleep apnea (adult) (pediatric): Secondary | ICD-10-CM

## 2014-12-12 DIAGNOSIS — F32A Depression, unspecified: Secondary | ICD-10-CM

## 2014-12-12 DIAGNOSIS — H8110 Benign paroxysmal vertigo, unspecified ear: Secondary | ICD-10-CM

## 2014-12-12 DIAGNOSIS — E785 Hyperlipidemia, unspecified: Secondary | ICD-10-CM

## 2014-12-12 DIAGNOSIS — R5382 Chronic fatigue, unspecified: Secondary | ICD-10-CM | POA: Diagnosis not present

## 2014-12-12 DIAGNOSIS — IMO0002 Reserved for concepts with insufficient information to code with codable children: Secondary | ICD-10-CM

## 2014-12-12 DIAGNOSIS — E1165 Type 2 diabetes mellitus with hyperglycemia: Secondary | ICD-10-CM | POA: Diagnosis not present

## 2014-12-12 DIAGNOSIS — E118 Type 2 diabetes mellitus with unspecified complications: Secondary | ICD-10-CM | POA: Diagnosis not present

## 2014-12-12 LAB — CBC WITH DIFFERENTIAL/PLATELET
BASOS PCT: 0.4 % (ref 0.0–3.0)
Basophils Absolute: 0 10*3/uL (ref 0.0–0.1)
EOS ABS: 0.4 10*3/uL (ref 0.0–0.7)
EOS PCT: 4.4 % (ref 0.0–5.0)
HCT: 41.1 % (ref 36.0–46.0)
HEMOGLOBIN: 13.3 g/dL (ref 12.0–15.0)
LYMPHS ABS: 1.6 10*3/uL (ref 0.7–4.0)
Lymphocytes Relative: 16.5 % (ref 12.0–46.0)
MCHC: 32.4 g/dL (ref 30.0–36.0)
MCV: 93.5 fl (ref 78.0–100.0)
MONO ABS: 0.7 10*3/uL (ref 0.1–1.0)
Monocytes Relative: 7.7 % (ref 3.0–12.0)
NEUTROS ABS: 6.8 10*3/uL (ref 1.4–7.7)
NEUTROS PCT: 71 % (ref 43.0–77.0)
PLATELETS: 361 10*3/uL (ref 150.0–400.0)
RBC: 4.39 Mil/uL (ref 3.87–5.11)
RDW: 14.4 % (ref 11.5–15.5)
WBC: 9.6 10*3/uL (ref 4.0–10.5)

## 2014-12-12 LAB — LIPID PANEL
CHOLESTEROL: 136 mg/dL (ref 0–200)
HDL: 29.2 mg/dL — AB (ref >39.00)
LDL CALC: 73 mg/dL (ref 0–99)
NonHDL: 107.18
Total CHOL/HDL Ratio: 5
Triglycerides: 173 mg/dL — ABNORMAL HIGH (ref 0.0–149.0)
VLDL: 34.6 mg/dL (ref 0.0–40.0)

## 2014-12-12 LAB — BASIC METABOLIC PANEL
BUN: 20 mg/dL (ref 6–23)
CALCIUM: 9.3 mg/dL (ref 8.4–10.5)
CHLORIDE: 103 meq/L (ref 96–112)
CO2: 26 meq/L (ref 19–32)
CREATININE: 0.98 mg/dL (ref 0.40–1.20)
GFR: 59.81 mL/min — ABNORMAL LOW (ref >60.00)
Glucose, Bld: 133 mg/dL — ABNORMAL HIGH (ref 70–99)
Potassium: 4.5 mEq/L (ref 3.5–5.1)
Sodium: 139 mEq/L (ref 135–145)

## 2014-12-12 LAB — HEMOGLOBIN A1C: HEMOGLOBIN A1C: 6.5 % (ref 4.6–6.5)

## 2014-12-12 LAB — TSH: TSH: 1.42 u[IU]/mL (ref 0.35–4.50)

## 2014-12-12 MED ORDER — MECLIZINE HCL 50 MG PO TABS: 50.0000 mg | ORAL_TABLET | Freq: Three times a day (TID) | ORAL | Status: DC | PRN

## 2014-12-12 MED ORDER — METOPROLOL TARTRATE 50 MG PO TABS: 50.0000 mg | ORAL_TABLET | Freq: Two times a day (BID) | ORAL | Status: DC

## 2014-12-12 NOTE — Assessment & Plan Note (Signed)
Declines further evaluation of OSA. Discussed that with her weight gain her OSA could have worsened and could be contributing to her fatigue.  Obtain CBC and TSH.

## 2014-12-12 NOTE — Assessment & Plan Note (Signed)
Obtain follow up a1c, continue current meds. We discussed her recent weight gain- she reports that she is working on weight loss.

## 2014-12-12 NOTE — Progress Notes (Signed)
Pre visit review using our clinic review tool, if applicable. No additional management support is needed unless otherwise documented below in the visit note. 

## 2014-12-12 NOTE — Assessment & Plan Note (Signed)
Continue prn meclizine

## 2014-12-12 NOTE — Progress Notes (Signed)
Subjective:    Patient ID: Kimberly Lin, female    DOB: October 12, 1945, 69 y.o.   MRN: [id number]  HPI  [name] is a 69 yr old female who presents today for follow up.  1) DM2- maintianed on actos, metformin, amaryl Lab Results  Component Value Date   HGBA1C 7.9* 03/24/2014   HGBA1C 7.1* 06/28/2013   HGBA1C 6.4* 10/15/2012   Lab Results  Component Value Date   MICROALBUR 2.0* 03/24/2014   LDLCALC 74 06/28/2013   CREATININE 1.13* 12/05/2014   2) Depression-  Currently on effexor.  Notes increased fatigue. Did not follow through with split night sleep study- declines further eval of OA  3) Hyperlipidemia- Lab Results  Component Value Date   CHOL 164 03/24/2014   HDL 34.50* 03/24/2014   LDLCALC 74 06/28/2013   LDLDIRECT 98.0 03/24/2014   TRIG 268.0* 03/24/2014   CHOLHDL 5 03/24/2014   4) Vertigo- was seen in the ED last week and was given rx for meclizine.  No significant HA.  Symptoms persist but are improving with PRN meclizien.    Wt Readings from Last 3 Encounters:  12/12/14 198 lb 3.2 oz (89.903 kg)  12/05/14 182 lb (82.555 kg)  09/21/14 203 lb 6.4 oz (92.262 kg)   Declines sleep study.    Review of Systems    see HPI  Past Medical History  Diagnosis Date  . Arthritis     osteoarthritis  . Fibromyalgia   . History of chicken pox   . Depression   . Diabetes mellitus   . GERD (gastroesophageal reflux disease)   . Hyperlipidemia   . Hypertension   . Migraine   . Personal history of colonic polyps   . History of septic shock 12/2009  . Spinal stenosis   . OSA (obstructive sleep apnea) 03/12/2011  . Hiatal hernia   . Abdominal mass   . Anxiety   . Colitis 2014  . Abnormal nuclear stress test 07/05/2014    Social History   Social History  . Marital Status: Married    Spouse Name: N/A  . Number of Children: 2  . Years of Education: N/A   Occupational History  . Not on file.   Social History Main Topics  . Smoking status: Former Smoker    Types: Cigarettes  . Smokeless tobacco: Never Used     Comment: Smoked socially.  . Alcohol Use: 3.6 - 4.8 oz/week    6-8 Glasses of wine per week  . Drug Use: No  . Sexual Activity: Not on file   Other Topics Concern  . Not on file   Social History Narrative   Regular exercise:  No   Caffeine use: 1 cup daily          Past Surgical History  Procedure Laterality Date  . Breast biopsy Right 2007, 2009  . Appendectomy  1983  . Medial partial knee replacement Bilateral 2006  . Ovarian cyst removal  2008    Pt states she had a ?cyst removed ?ovaries  . Polypectomy  1998  . Colonoscopy  2014    normal   . Cardiac catheterization N/A 07/05/2014    Procedure: Left Heart Cath and Coronary Angiography;  Surgeon: Sherren Mocha, MD;  Location: [facility];  Service: Cardiovascular;  Laterality: N/A;    Family History  Problem Relation Age of Onset  . Arthritis Mother   . Hyperlipidemia Mother   . Hypertension Mother   . Diabetes Mother   .  Heart disease Mother   . Lung cancer Brother   . Heart disease Brother     x 2  . Hypertension Brother   . Hyperlipidemia Brother   . Diabetes Brother   . Sudden death Father   . Diabetes Sister   . Hyperlipidemia Sister   . Hypertension Sister   . Diabetes Maternal Aunt   . Heart attack Maternal Aunt   . Hyperlipidemia Maternal Aunt   . Hypertension Maternal Aunt   . Hypertension Maternal Uncle   . Hyperlipidemia Maternal Uncle   . Heart attack Maternal Uncle   . Diabetes Maternal Uncle   . Diabetes Paternal Aunt   . Heart attack Paternal Aunt   . Hyperlipidemia Paternal Aunt   . Hypertension Paternal Aunt   . Hypertension Paternal Uncle   . Hyperlipidemia Paternal Uncle   . Heart attack Paternal Uncle   . Diabetes Paternal Uncle   . Breast cancer Mother   . Lung cancer Father   . Rectal cancer Father   . Prostate cancer Brother     Allergies  Allergen Reactions  . Erythromycin Swelling    "all mycins"  .  Prednisone Other (See Comments)    "Insomnia"    Current Outpatient Prescriptions on File Prior to Visit  Medication Sig Dispense Refill  . ALPRAZolam (XANAX) 1 MG tablet TAKE ONE TABLET BY MOUTH TWICE DAILY AS NEEDED FOR ANXIETY 60 tablet 0  . amLODipine (NORVASC) 5 MG tablet Take 1 tablet (5 mg total) by mouth daily. 30 tablet 5  . aspirin 81 MG tablet Take 81 mg by mouth daily.      Marland Kitchen atorvastatin (LIPITOR) 40 MG tablet Take 1 tablet (40 mg total) by mouth daily. 90 tablet 3  . Calcium Carbonate-Vitamin D (CALCIUM-D) 600-400 MG-UNIT TABS Take 1 tablet by mouth daily.     . Coenzyme Q10 (CO Q 10) 100 MG CAPS Take 100 mg by mouth daily.    . diphenhydrAMINE (BENADRYL) 25 MG tablet Take 25 mg by mouth every 6 (six) hours as needed for itching.     . escitalopram (LEXAPRO) 10 MG tablet 1 tab by mouth once daily. 30 tablet 3  . gabapentin (NEURONTIN) 300 MG capsule Take 300 mg by mouth 2 (two) times daily.    Marland Kitchen glimepiride (AMARYL) 1 MG tablet TAKE 1 TABLET BY MOUTH ONCE DAILY WITH BREAKFAST (Patient taking differently: Take 1 mg by mouth daily with breakfast. ) 90 tablet 1  . losartan (COZAAR) 50 MG tablet Take 1 tablet (50 mg total) by mouth daily. 90 tablet 1  . metFORMIN (GLUCOPHAGE) 500 MG tablet TAKE TWO TABLETS BY MOUTH TWICE DAILY WITH  MEALS 120 tablet 1  . Multiple Vitamin (MULTIVITAMIN) tablet Take 1 tablet by mouth daily.      . Omega-3 Fatty Acids (FISH OIL) 1000 MG CAPS 2 caps by mouth twice daily (Patient taking differently: Take 2,000 mg by mouth 2 (two) times daily. )  0  . pantoprazole (PROTONIX) 40 MG tablet Take 1 tablet (40 mg total) by mouth daily. 90 tablet 1  . pioglitazone (ACTOS) 30 MG tablet Take 1 tablet (30 mg total) by mouth daily. 90 tablet 1  . venlafaxine XR (EFFEXOR-XR) 75 MG 24 hr capsule Take 2 capsules by mouth once a day. 60 capsule 3  . zolpidem (AMBIEN) 10 MG tablet TAKE ONE TABLET BY MOUTH AT BEDTIME AS NEEDED FOR SLEEP 30 tablet 0   No current  facility-administered medications on file prior to  visit.    BP 124/66 mmHg  Pulse 73  Temp(Src) 99 F (37.2 C) (Oral)  Resp 18  Ht 5' 3.5" (1.613 m)  Wt 198 lb 3.2 oz (89.903 kg)  BMI 34.55 kg/m2  SpO2 99%    Objective:   Physical Exam  Constitutional: She is oriented to person, place, and time. She appears well-developed and well-nourished.  HENT:  Head: Normocephalic and atraumatic.  Eyes: EOM are normal.  Cardiovascular: Normal rate, regular rhythm and normal heart sounds.   No murmur heard. Pulmonary/Chest: Effort normal and breath sounds normal. No respiratory distress. She has no wheezes.  Musculoskeletal: She exhibits no edema.  Neurological: She is alert and oriented to person, place, and time. She exhibits normal muscle tone. Coordination normal.  Psychiatric: She has a normal mood and affect. Her behavior is normal. Judgment and thought content normal.          Assessment & Plan:

## 2014-12-12 NOTE — Assessment & Plan Note (Signed)
Depression stable.  Continue effexor.

## 2014-12-12 NOTE — Patient Instructions (Signed)
Please complete lab work and AVS prior to leaving. Continue meclizine as needed. Call if symptoms worsen or do not improve.

## 2014-12-12 NOTE — Assessment & Plan Note (Signed)
Trigs elevated last visit. Obtain follow up lipid panel. Continue lipitor.

## 2014-12-13 ENCOUNTER — Encounter: Payer: Self-pay | Admitting: Family

## 2014-12-18 ENCOUNTER — Telehealth: Payer: Self-pay | Admitting: Family

## 2014-12-18 MED ORDER — ALPRAZOLAM 1 MG PO TABS: 1.0000 mg | ORAL_TABLET | Freq: Two times a day (BID) | ORAL | Status: DC | PRN

## 2014-12-18 NOTE — Telephone Encounter (Signed)
Notified pt per 12/13/14 lab letter and she voices understanding. Pt also states that she called in a refill of her xanax to [facility]. I do not see that we have received request but informed pt we would send refill. Last Rx printed 11/08/14, #60.  Last UDS 12/13/14.  Rx printed and forwarded to PCP for signature.

## 2014-12-18 NOTE — Telephone Encounter (Signed)
Caller name: [place]   Relationship to patient: Self   Can be reached: [phone]  Pharmacy:  Reason for call: Pt is calling in for lab results.

## 2014-12-18 NOTE — Telephone Encounter (Signed)
Rx faxed to pharmacy  

## 2014-12-19 ENCOUNTER — Emergency Department (HOSPITAL_BASED_OUTPATIENT_CLINIC_OR_DEPARTMENT_OTHER): Payer: Medicare Other

## 2014-12-19 ENCOUNTER — Telehealth: Payer: Self-pay | Admitting: Family

## 2014-12-19 ENCOUNTER — Emergency Department (HOSPITAL_BASED_OUTPATIENT_CLINIC_OR_DEPARTMENT_OTHER)
Admission: EM | Admit: 2014-12-19 | Discharge: 2014-12-19 | Disposition: A | Discharge status: Home / Self Care | Payer: Medicare Other | Attending: Emergency Medicine | Admitting: Emergency Medicine

## 2014-12-19 ENCOUNTER — Other Ambulatory Visit: Payer: Self-pay | Admitting: Family

## 2014-12-19 DIAGNOSIS — I10 Essential (primary) hypertension: Secondary | ICD-10-CM | POA: Insufficient documentation

## 2014-12-19 DIAGNOSIS — Z79899 Other long term (current) drug therapy: Secondary | ICD-10-CM | POA: Diagnosis not present

## 2014-12-19 DIAGNOSIS — Z87891 Personal history of nicotine dependence: Secondary | ICD-10-CM | POA: Insufficient documentation

## 2014-12-19 DIAGNOSIS — R110 Nausea: Secondary | ICD-10-CM | POA: Insufficient documentation

## 2014-12-19 DIAGNOSIS — Z7982 Long term (current) use of aspirin: Secondary | ICD-10-CM | POA: Insufficient documentation

## 2014-12-19 DIAGNOSIS — E785 Hyperlipidemia, unspecified: Secondary | ICD-10-CM | POA: Insufficient documentation

## 2014-12-19 DIAGNOSIS — F419 Anxiety disorder, unspecified: Secondary | ICD-10-CM | POA: Diagnosis not present

## 2014-12-19 DIAGNOSIS — M797 Fibromyalgia: Secondary | ICD-10-CM | POA: Insufficient documentation

## 2014-12-19 DIAGNOSIS — K219 Gastro-esophageal reflux disease without esophagitis: Secondary | ICD-10-CM | POA: Diagnosis not present

## 2014-12-19 DIAGNOSIS — Z86010 Personal history of colonic polyps: Secondary | ICD-10-CM | POA: Diagnosis not present

## 2014-12-19 DIAGNOSIS — Z8619 Personal history of other infectious and parasitic diseases: Secondary | ICD-10-CM | POA: Diagnosis not present

## 2014-12-19 DIAGNOSIS — G43909 Migraine, unspecified, not intractable, without status migrainosus: Secondary | ICD-10-CM | POA: Diagnosis not present

## 2014-12-19 DIAGNOSIS — R0600 Dyspnea, unspecified: Secondary | ICD-10-CM

## 2014-12-19 DIAGNOSIS — M1990 Unspecified osteoarthritis, unspecified site: Secondary | ICD-10-CM | POA: Insufficient documentation

## 2014-12-19 DIAGNOSIS — F329 Major depressive disorder, single episode, unspecified: Secondary | ICD-10-CM | POA: Diagnosis not present

## 2014-12-19 DIAGNOSIS — E119 Type 2 diabetes mellitus without complications: Secondary | ICD-10-CM | POA: Diagnosis not present

## 2014-12-19 DIAGNOSIS — R0602 Shortness of breath: Secondary | ICD-10-CM | POA: Diagnosis present

## 2014-12-19 LAB — COMPREHENSIVE METABOLIC PANEL
ALT: 17 U/L (ref 14–54)
AST: 38 U/L (ref 15–41)
Albumin: 3.7 g/dL (ref 3.5–5.0)
Alkaline Phosphatase: 57 U/L (ref 38–126)
Anion gap: 8 (ref 5–15)
BUN: 16 mg/dL (ref 6–20)
CHLORIDE: 101 mmol/L (ref 101–111)
CO2: 26 mmol/L (ref 22–32)
CREATININE: 0.98 mg/dL (ref 0.44–1.00)
Calcium: 9.1 mg/dL (ref 8.9–10.3)
GFR, EST NON AFRICAN AMERICAN: 58 mL/min — AB (ref >60)
Glucose, Bld: 107 mg/dL — ABNORMAL HIGH (ref 65–99)
Potassium: 4.2 mmol/L (ref 3.5–5.1)
SODIUM: 135 mmol/L (ref 135–145)
Total Bilirubin: 0.6 mg/dL (ref 0.3–1.2)
Total Protein: 6.8 g/dL (ref 6.5–8.1)

## 2014-12-19 LAB — CBC WITH DIFFERENTIAL/PLATELET
BASOS ABS: 0 10*3/uL (ref 0.0–0.1)
Basophils Relative: 1 %
EOS ABS: 0.5 10*3/uL (ref 0.0–0.7)
EOS PCT: 6 %
HCT: 37.1 % (ref 36.0–46.0)
Hemoglobin: 12.5 g/dL (ref 12.0–15.0)
Lymphocytes Relative: 21 %
Lymphs Abs: 1.8 10*3/uL (ref 0.7–4.0)
MCH: 30.9 pg (ref 26.0–34.0)
MCHC: 33.7 g/dL (ref 30.0–36.0)
MCV: 91.8 fL (ref 78.0–100.0)
MONO ABS: 0.6 10*3/uL (ref 0.1–1.0)
Monocytes Relative: 7 %
Neutro Abs: 5.7 10*3/uL (ref 1.7–7.7)
Neutrophils Relative %: 65 %
PLATELETS: 239 10*3/uL (ref 150–400)
RBC: 4.04 MIL/uL (ref 3.87–5.11)
RDW: 13.9 % (ref 11.5–15.5)
WBC: 8.6 10*3/uL (ref 4.0–10.5)

## 2014-12-19 LAB — LIPASE, BLOOD: LIPASE: 44 U/L (ref 11–51)

## 2014-12-19 LAB — BRAIN NATRIURETIC PEPTIDE: B NATRIURETIC PEPTIDE 5: 76.4 pg/mL (ref 0.0–100.0)

## 2014-12-19 LAB — TROPONIN I: Troponin I: <0.03 ng/mL (ref <0.031)

## 2014-12-19 LAB — D-DIMER, QUANTITATIVE (NOT AT ARMC)

## 2014-12-19 MED ORDER — ONDANSETRON 8 MG PO TBDP: 8.0000 mg | ORAL_TABLET | Freq: Three times a day (TID) | ORAL | Status: DC | PRN

## 2014-12-19 NOTE — Telephone Encounter (Signed)
Patient Name: Kimberly Lin  DOB: 1945/05/15    Initial Comment Caller states having labored breathing and chest discomfort   Nurse Assessment  Nurse: Leilani Merl, RN, Heather Date/Time (Eastern Time): 12/19/2014 11:42:26 AM  Confirm and document reason for call. If symptomatic, describe symptoms. ---Caller states that she has been having diff breathing, back discomfort, and vertigo for over 3 weeks, she went to the ED and they diagnosed her with vertigo. She still has all the symptoms.  Has the patient traveled out of the country within the last 30 days? ---Not Applicable  Does the patient have any new or worsening symptoms? ---Yes  Will a triage be completed? ---Yes  Related visit to physician within the last 2 weeks? ---No  Does the PT have any chronic conditions? (i.e. diabetes, asthma, etc.) ---Yes  List chronic conditions. ---see MR     Guidelines    Guideline Title Affirmed Question Affirmed Notes  Breathing Difficulty Patient sounds very sick or weak to the triager    Final Disposition User   Go to ED Now (or PCP triage) Leilani Merl, RN, Heather    Referrals  [facility] - ED  [facility] - ED   Disagree/Comply: Comply

## 2014-12-19 NOTE — Telephone Encounter (Addendum)
Noted. Called patient,per husband she is currently in the ED.

## 2014-12-19 NOTE — Discharge Instructions (Signed)

## 2014-12-19 NOTE — ED Notes (Signed)
Sob for 3 weeks. States she has been treated for dizziness and was started on Meclizine recently.

## 2014-12-19 NOTE — ED Provider Notes (Signed)
CSN: [id number]     Arrival date & time 12/19/14  1158 History   First MD Initiated Contact with Patient 12/19/14 1227     Chief Complaint  Patient presents with  . Shortness of Breath     (Consider location/radiation/quality/duration/timing/severity/associated sxs/prior Treatment) HPI   69 year old female history of fibromyalgia, hypertension, and hyperlipidemia who presents today complaining of dyspnea for the past 3 weeks. She states that she was seen here at about that time was treated for vertigo with meclizine. She states she had been having some dyspnea at that time but did not address it as she was more concerned that the dizziness. The dizziness has gradually improved and she has been taking meclizine. However, the dyspnea has continued. She denies chest pain, cough, even her, chills, peripheral edema, lateralized swelling of her legs, history of DVT, or PE. She states that she had a cardiac catheterization several months ago that did not show any acute stenosis. She is to be treating this with lifestyle modifications.  She is having some mid back pain.  The dyspnea gets worse with exertion, but not really improved with rest.  She has not done any intervention to make it better.    Past Medical History  Diagnosis Date  . Arthritis     osteoarthritis  . Fibromyalgia   . History of chicken pox   . Depression   . Diabetes mellitus   . GERD (gastroesophageal reflux disease)   . Hyperlipidemia   . Hypertension   . Migraine   . Personal history of colonic polyps   . History of septic shock 12/2009  . Spinal stenosis   . OSA (obstructive sleep apnea) 03/12/2011  . Hiatal hernia   . Abdominal mass   . Anxiety   . Colitis 2014  . Abnormal nuclear stress test 07/05/2014   Past Surgical History  Procedure Laterality Date  . Breast biopsy Right 2007, 2009  . Appendectomy  1983  . Medial partial knee replacement Bilateral 2006  . Ovarian cyst removal  2008    Pt states she had a  ?cyst removed ?ovaries  . Polypectomy  1998  . Colonoscopy  2014    normal   . Cardiac catheterization N/A 07/05/2014    Procedure: Left Heart Cath and Coronary Angiography;  Surgeon: Sherren Mocha, MD;  Location: [facility];  Service: Cardiovascular;  Laterality: N/A;   Family History  Problem Relation Age of Onset  . Arthritis Mother   . Hyperlipidemia Mother   . Hypertension Mother   . Diabetes Mother   . Heart disease Mother   . Lung cancer Brother   . Heart disease Brother     x 2  . Hypertension Brother   . Hyperlipidemia Brother   . Diabetes Brother   . Sudden death Father   . Diabetes Sister   . Hyperlipidemia Sister   . Hypertension Sister   . Diabetes Maternal Aunt   . Heart attack Maternal Aunt   . Hyperlipidemia Maternal Aunt   . Hypertension Maternal Aunt   . Hypertension Maternal Uncle   . Hyperlipidemia Maternal Uncle   . Heart attack Maternal Uncle   . Diabetes Maternal Uncle   . Diabetes Paternal Aunt   . Heart attack Paternal Aunt   . Hyperlipidemia Paternal Aunt   . Hypertension Paternal Aunt   . Hypertension Paternal Uncle   . Hyperlipidemia Paternal Uncle   . Heart attack Paternal Uncle   . Diabetes Paternal Uncle   .  Breast cancer Mother   . Lung cancer Father   . Rectal cancer Father   . Prostate cancer Brother    Social History  Substance Use Topics  . Smoking status: Former Smoker    Types: Cigarettes  . Smokeless tobacco: Never Used     Comment: Smoked socially.  . Alcohol Use: 3.6 - 4.8 oz/week    6-8 Glasses of wine per week   OB History    No data available     Review of Systems  All other systems reviewed and are negative.     Allergies  Erythromycin and Prednisone  Home Medications   Prior to Admission medications   Medication Sig Start Date End Date Taking? Authorizing Provider  ALPRAZolam Duanne Moron) 1 MG tablet Take 1 tablet (1 mg total) by mouth 2 (two) times daily as needed. for anxiety 12/18/14   Debbrah Alar, NP  amLODipine (NORVASC) 5 MG tablet Take 1 tablet (5 mg total) by mouth daily. 06/12/14   Debbrah Alar, NP  aspirin 81 MG tablet Take 81 mg by mouth daily.      Historical Provider, MD  atorvastatin (LIPITOR) 40 MG tablet Take 1 tablet (40 mg total) by mouth daily. 09/21/14   [name], MD  Calcium Carbonate-Vitamin D (CALCIUM-D) 600-400 MG-UNIT TABS Take 1 tablet by mouth daily.     Historical Provider, MD  Coenzyme Q10 (CO Q 10) 100 MG CAPS Take 100 mg by mouth daily.    Historical Provider, MD  diphenhydrAMINE (BENADRYL) 25 MG tablet Take 25 mg by mouth every 6 (six) hours as needed for itching.     Historical Provider, MD  escitalopram (LEXAPRO) 10 MG tablet 1 tab by mouth once daily. 09/11/14   Debbrah Alar, NP  gabapentin (NEURONTIN) 300 MG capsule Take 300 mg by mouth 2 (two) times daily. 10/26/13   Historical Provider, MD  glimepiride (AMARYL) 1 MG tablet TAKE ONE TABLET BY MOUTH ONCE DAILY WITH BREAKFAST 12/19/14   Alferd Apa Lowne, DO  losartan (COZAAR) 50 MG tablet TAKE ONE TABLET BY MOUTH ONCE DAILY 12/19/14   Rosalita Chessman, DO  meclizine (ANTIVERT) 50 MG tablet Take 1 tablet (50 mg total) by mouth 3 (three) times daily as needed. 12/12/14   Debbrah Alar, NP  metFORMIN (GLUCOPHAGE) 500 MG tablet TAKE TWO TABLETS BY MOUTH TWICE DAILY WITH  MEALS 10/27/14   Debbrah Alar, NP  metoprolol (LOPRESSOR) 50 MG tablet Take 1 tablet (50 mg total) by mouth 2 (two) times daily. 12/12/14   Debbrah Alar, NP  Multiple Vitamin (MULTIVITAMIN) tablet Take 1 tablet by mouth daily.      Historical Provider, MD  Omega-3 Fatty Acids (FISH OIL) 1000 MG CAPS 2 caps by mouth twice daily Patient taking differently: Take 2,000 mg by mouth 2 (two) times daily.  04/16/11   Debbrah Alar, NP  pantoprazole (PROTONIX) 40 MG tablet TAKE ONE TABLET BY MOUTH ONCE DAILY 12/12/14   Debbrah Alar, NP  pioglitazone (ACTOS) 30 MG tablet Take 1 tablet (30 mg total) by mouth daily.  05/26/14   Debbrah Alar, NP  venlafaxine XR (EFFEXOR-XR) 75 MG 24 hr capsule Take 2 capsules by mouth once a day. 10/05/14   Debbrah Alar, NP  zolpidem (AMBIEN) 10 MG tablet TAKE ONE TABLET BY MOUTH AT BEDTIME AS NEEDED FOR SLEEP 12/06/14   Debbrah Alar, NP   BP 137/79 mmHg  Pulse 64  Temp(Src) 97.6 F (36.4 C) (Oral)  Resp 21  Ht 5' 3.5" (1.613  m)  Wt 198 lb (89.812 kg)  BMI 34.52 kg/m2  SpO2 98% Physical Exam  Constitutional: She is oriented to person, place, and time. She appears well-developed and well-nourished.  HENT:  Head: Normocephalic and atraumatic.  Right Ear: External ear normal.  Left Ear: External ear normal.  Nose: Nose normal.  Mouth/Throat: Oropharynx is clear and moist.  Eyes: Conjunctivae and EOM are normal. Pupils are equal, round, and reactive to light.  Neck: Normal range of motion. Neck supple.  Cardiovascular: Normal rate, regular rhythm, normal heart sounds and intact distal pulses.   Pulmonary/Chest: Effort normal and breath sounds normal.  Abdominal: Soft. Bowel sounds are normal.  Musculoskeletal: Normal range of motion. She exhibits no edema or tenderness.  Neurological: She is alert and oriented to person, place, and time. She has normal reflexes.  Skin: Skin is warm and dry.  Psychiatric: She has a normal mood and affect. Her behavior is normal. Judgment and thought content normal.  Nursing note and vitals reviewed.   ED Course  Procedures (including critical care time) Labs Review Labs Reviewed  COMPREHENSIVE METABOLIC PANEL - Abnormal; Notable for the following:    Glucose, Bld 107 (*)    GFR calc non Af Amer 58 (*)    All other components within normal limits  CBC WITH DIFFERENTIAL/PLATELET  LIPASE, BLOOD  BRAIN NATRIURETIC PEPTIDE  TROPONIN I  D-DIMER, QUANTITATIVE (NOT AT [facility])    Imaging Review Dg Chest 2 View  12/19/2014  CLINICAL DATA:  Shortness of breath for 3 weeks. EXAM: CHEST  2 VIEW COMPARISON:  03/24/2014  FINDINGS: The heart size and mediastinal contours are within normal limits. Both lungs are clear. The visualized skeletal structures are unremarkable. IMPRESSION: No active cardiopulmonary disease. Electronically Signed   By: Rolm Baptise M.D.   On: 12/19/2014 13:15   I have personally reviewed and evaluated these images and lab results as part of my medical decision-making.   EKG Interpretation   Date/Time:  Tuesday December 19 2014 13:36:11 EST Ventricular Rate:  64 PR Interval:  158 QRS Duration: 82 QT Interval:  420 QTC Calculation: 433 R Axis:   2 Text Interpretation:  Normal sinus rhythm Low voltage QRS Nonspecific T  wave abnormality Abnormal ECG Confirmed by Avabella Wailes MD, Andee Poles ([id number]) on  12/19/2014 3:02:42 PM      MDM   Final diagnoses:  Dyspnea  Nausea   69 y.o. Female with dyspnea.  She has some known cad without acutely ischemic ekg, troponin normal.  Remainder of work up with clear cxr (doubt pneumonia, neoplasm, pneumothorax), bnp negative, d-dimer normal with low suspicion for pe, no signs of dvt.  Patient complains of some nausea.    Pattricia Boss, MD 12/19/14 [phone]

## 2014-12-22 ENCOUNTER — Other Ambulatory Visit (INDEPENDENT_AMBULATORY_CARE_PROVIDER_SITE_OTHER): Payer: Medicare Other

## 2014-12-22 DIAGNOSIS — E785 Hyperlipidemia, unspecified: Secondary | ICD-10-CM | POA: Diagnosis not present

## 2014-12-26 ENCOUNTER — Other Ambulatory Visit (INDEPENDENT_AMBULATORY_CARE_PROVIDER_SITE_OTHER): Payer: Medicare Other | Admitting: *Deleted

## 2014-12-26 DIAGNOSIS — I10 Essential (primary) hypertension: Secondary | ICD-10-CM | POA: Diagnosis not present

## 2014-12-26 DIAGNOSIS — E785 Hyperlipidemia, unspecified: Secondary | ICD-10-CM | POA: Diagnosis not present

## 2014-12-26 LAB — COMPLETE METABOLIC PANEL WITH GFR
ALBUMIN: 3.6 g/dL (ref 3.6–5.1)
ALT: 12 U/L (ref 6–29)
AST: 19 U/L (ref 10–35)
Alkaline Phosphatase: 50 U/L (ref 33–130)
BUN: 13 mg/dL (ref 7–25)
CO2: 25 mmol/L (ref 20–31)
Calcium: 8.7 mg/dL (ref 8.6–10.4)
Chloride: 104 mmol/L (ref 98–110)
Creat: 0.82 mg/dL (ref 0.50–0.99)
GFR, EST AFRICAN AMERICAN: 84 mL/min (ref >=60)
GFR, EST NON AFRICAN AMERICAN: 73 mL/min (ref >=60)
Glucose, Bld: 153 mg/dL — ABNORMAL HIGH (ref 65–99)
POTASSIUM: 4.2 mmol/L (ref 3.5–5.3)
SODIUM: 139 mmol/L (ref 135–146)
Total Bilirubin: 0.8 mg/dL (ref 0.2–1.2)
Total Protein: 6.7 g/dL (ref 6.1–8.1)

## 2014-12-26 LAB — HEPATIC FUNCTION PANEL
ALT: 12 U/L (ref 6–29)
AST: 19 U/L (ref 10–35)
Albumin: 3.6 g/dL (ref 3.6–5.1)
Alkaline Phosphatase: 50 U/L (ref 33–130)
BILIRUBIN DIRECT: 0.2 mg/dL (ref <=0.2)
BILIRUBIN INDIRECT: 0.6 mg/dL (ref 0.2–1.2)
Total Bilirubin: 0.8 mg/dL (ref 0.2–1.2)
Total Protein: 6.7 g/dL (ref 6.1–8.1)

## 2014-12-27 ENCOUNTER — Telehealth: Payer: Self-pay | Admitting: Family

## 2014-12-27 NOTE — Telephone Encounter (Signed)
Caller name: Self   Can be reached:[phone]   Reason for call: Patient has had 2 episodes of light headedness and shaking in past week. (Friday and Tuesday) Plse Adv

## 2014-12-28 ENCOUNTER — Telehealth: Payer: Self-pay

## 2014-12-28 NOTE — Telephone Encounter (Signed)
Returned patients call. States she was able to get appointment for tomorrow. Advised if symptoms worsened she would need to go to ED. Patient agreed.

## 2014-12-28 NOTE — Telephone Encounter (Signed)
Please call pt @ [phone] regarding dizziness

## 2014-12-29 ENCOUNTER — Ambulatory Visit (INDEPENDENT_AMBULATORY_CARE_PROVIDER_SITE_OTHER): Payer: Medicare Other | Admitting: Family

## 2014-12-29 ENCOUNTER — Encounter: Payer: Self-pay | Admitting: Family

## 2014-12-29 VITALS — BP 136/62 | HR 72 | Temp 98.4°F | Resp 16 | Ht 63.5 in | Wt 198.0 lb

## 2014-12-29 DIAGNOSIS — I2510 Atherosclerotic heart disease of native coronary artery without angina pectoris: Secondary | ICD-10-CM | POA: Diagnosis not present

## 2014-12-29 DIAGNOSIS — E162 Hypoglycemia, unspecified: Secondary | ICD-10-CM | POA: Diagnosis not present

## 2014-12-29 LAB — GLUCOSE, POCT (MANUAL RESULT ENTRY): POC Glucose: 158 mg/dl — AB (ref 70–99)

## 2014-12-29 NOTE — Patient Instructions (Signed)
Stop glimepiride.  Keep juice or candy in your purse in case of low blood sugar. If you have further episodes, please check your blood sugar and call me.

## 2014-12-29 NOTE — Telephone Encounter (Signed)
Appointment was scheduled for patient.

## 2014-12-29 NOTE — Progress Notes (Signed)
Subjective:    Patient ID: Kimberly Lin, female    DOB: 1945-09-21, 69 y.o.   MRN: [id number]  HPI  Kimberly Lin is a 69 yr old female who presents today to discuss fatigue.  She reports 2 episodes of feeling hot/sweating, weak, legs felt "shakey" but were not actually shaking. Both lasted one hour. Had not eaten in several hours prior to these events and did not check her blood sugar.   Reports feeling warm and clammy- but mild presently.   Review of Systems See HPI  Past Medical History  Diagnosis Date  . Arthritis     osteoarthritis  . Fibromyalgia   . History of chicken pox   . Depression   . Diabetes mellitus   . GERD (gastroesophageal reflux disease)   . Hyperlipidemia   . Hypertension   . Migraine   . Personal history of colonic polyps   . History of septic shock 12/2009  . Spinal stenosis   . OSA (obstructive sleep apnea) 03/12/2011  . Hiatal hernia   . Abdominal mass   . Anxiety   . Colitis 2014  . Abnormal nuclear stress test 07/05/2014    Social History   Social History  . Marital Status: Married    Spouse Name: N/A  . Number of Children: 2  . Years of Education: N/A   Occupational History  . Not on file.   Social History Main Topics  . Smoking status: Former Smoker    Types: Cigarettes  . Smokeless tobacco: Never Used     Comment: Smoked socially.  . Alcohol Use: 3.6 - 4.8 oz/week    6-8 Glasses of wine per week  . Drug Use: No  . Sexual Activity: Not on file   Other Topics Concern  . Not on file   Social History Narrative   Regular exercise:  No   Caffeine use: 1 cup daily          Past Surgical History  Procedure Laterality Date  . Breast biopsy Right 2007, 2009  . Appendectomy  1983  . Medial partial knee replacement Bilateral 2006  . Ovarian cyst removal  2008    Pt states she had a ?cyst removed ?ovaries  . Polypectomy  1998  . Colonoscopy  2014    normal   . Cardiac catheterization N/A 07/05/2014    Procedure: Left Heart  Cath and Coronary Angiography;  Surgeon: Sherren Mocha, MD;  Location: [facility];  Service: Cardiovascular;  Laterality: N/A;    Family History  Problem Relation Age of Onset  . Arthritis Mother   . Hyperlipidemia Mother   . Hypertension Mother   . Diabetes Mother   . Heart disease Mother   . Lung cancer Brother   . Heart disease Brother     x 2  . Hypertension Brother   . Hyperlipidemia Brother   . Diabetes Brother   . Sudden death Father   . Diabetes Sister   . Hyperlipidemia Sister   . Hypertension Sister   . Diabetes Maternal Aunt   . Heart attack Maternal Aunt   . Hyperlipidemia Maternal Aunt   . Hypertension Maternal Aunt   . Hypertension Maternal Uncle   . Hyperlipidemia Maternal Uncle   . Heart attack Maternal Uncle   . Diabetes Maternal Uncle   . Diabetes Paternal Aunt   . Heart attack Paternal Aunt   . Hyperlipidemia Paternal Aunt   . Hypertension Paternal Aunt   . Hypertension Paternal  Uncle   . Hyperlipidemia Paternal Uncle   . Heart attack Paternal Uncle   . Diabetes Paternal Uncle   . Breast cancer Mother   . Lung cancer Father   . Rectal cancer Father   . Prostate cancer Brother     Allergies  Allergen Reactions  . Erythromycin Swelling    "all mycins"  . Prednisone Other (See Comments)    "Insomnia"    Current Outpatient Prescriptions on File Prior to Visit  Medication Sig Dispense Refill  . ALPRAZolam (XANAX) 1 MG tablet Take 1 tablet (1 mg total) by mouth 2 (two) times daily as needed. for anxiety 60 tablet 0  . amLODipine (NORVASC) 5 MG tablet Take 1 tablet (5 mg total) by mouth daily. 30 tablet 5  . aspirin 81 MG tablet Take 81 mg by mouth daily.      Marland Kitchen atorvastatin (LIPITOR) 40 MG tablet Take 1 tablet (40 mg total) by mouth daily. 90 tablet 3  . Calcium Carbonate-Vitamin D (CALCIUM-D) 600-400 MG-UNIT TABS Take 1 tablet by mouth daily.     . Coenzyme Q10 (CO Q 10) 100 MG CAPS Take 100 mg by mouth daily.    . diphenhydrAMINE  (BENADRYL) 25 MG tablet Take 25 mg by mouth every 6 (six) hours as needed for itching.     . escitalopram (LEXAPRO) 10 MG tablet 1 tab by mouth once daily. 30 tablet 3  . gabapentin (NEURONTIN) 300 MG capsule Take 300 mg by mouth 2 (two) times daily.    Marland Kitchen glimepiride (AMARYL) 1 MG tablet TAKE ONE TABLET BY MOUTH ONCE DAILY WITH BREAKFAST 90 tablet 0  . losartan (COZAAR) 50 MG tablet TAKE ONE TABLET BY MOUTH ONCE DAILY 90 tablet 0  . metFORMIN (GLUCOPHAGE) 500 MG tablet TAKE TWO TABLETS BY MOUTH TWICE DAILY WITH  MEALS 120 tablet 1  . metoprolol (LOPRESSOR) 50 MG tablet Take 1 tablet (50 mg total) by mouth 2 (two) times daily. 60 tablet 2  . Multiple Vitamin (MULTIVITAMIN) tablet Take 1 tablet by mouth daily.      . Omega-3 Fatty Acids (FISH OIL) 1000 MG CAPS 2 caps by mouth twice daily (Patient taking differently: Take 2,000 mg by mouth 2 (two) times daily. )  0  . ondansetron (ZOFRAN ODT) 8 MG disintegrating tablet Take 1 tablet (8 mg total) by mouth every 8 (eight) hours as needed for nausea or vomiting. 20 tablet 0  . pantoprazole (PROTONIX) 40 MG tablet TAKE ONE TABLET BY MOUTH ONCE DAILY 90 tablet 1  . pioglitazone (ACTOS) 30 MG tablet Take 1 tablet (30 mg total) by mouth daily. 90 tablet 1  . venlafaxine XR (EFFEXOR-XR) 75 MG 24 hr capsule Take 2 capsules by mouth once a day. 60 capsule 3  . zolpidem (AMBIEN) 10 MG tablet TAKE ONE TABLET BY MOUTH AT BEDTIME AS NEEDED FOR SLEEP 30 tablet 0  . meclizine (ANTIVERT) 50 MG tablet Take 1 tablet (50 mg total) by mouth 3 (three) times daily as needed. (Patient not taking: Reported on 12/29/2014) 30 tablet 0   No current facility-administered medications on file prior to visit.    BP 136/62 mmHg  Pulse 72  Temp(Src) 98.4 F (36.9 C) (Oral)  Resp 16  Ht 5' 3.5" (1.613 m)  Wt 198 lb (89.812 kg)  BMI 34.52 kg/m2  SpO2 96%       Objective:   Physical Exam  Constitutional: She appears well-developed and well-nourished.  Cardiovascular:  Normal rate, regular rhythm and normal  heart sounds.   No murmur heard. Pulmonary/Chest: Effort normal and breath sounds normal. No respiratory distress. She has no wheezes.  Skin: Skin is warm and dry.  Psychiatric: She has a normal mood and affect. Her behavior is normal. Judgment and thought content normal.          Assessment & Plan:  Hypoglycemia- events describe are most consistent with hypoglycemia. Advised pt to d/c amaryl, keep candy/juice at hand in case of recurrent hypoglycemia and to call us if recurrent episodes.

## 2015-01-08 ENCOUNTER — Other Ambulatory Visit: Payer: Self-pay | Admitting: Family

## 2015-01-09 NOTE — Telephone Encounter (Signed)
Last filled: 12/06/14 Amt: 30,0 Last OV: 12/29/14 Contract on file UDS:  MODERATE risk

## 2015-01-11 ENCOUNTER — Other Ambulatory Visit: Payer: Self-pay

## 2015-01-11 ENCOUNTER — Other Ambulatory Visit: Payer: Self-pay | Admitting: Family

## 2015-01-11 MED ORDER — AMLODIPINE BESYLATE 5 MG PO TABS: 5.0000 mg | ORAL_TABLET | Freq: Every day | ORAL | Status: DC

## 2015-01-12 NOTE — Telephone Encounter (Signed)
Alprazolam Rx printed and forwarded to PCP for signature. To fill on or after 01/16/15.

## 2015-01-12 NOTE — Telephone Encounter (Signed)
Rx faxed to pharmacy  

## 2015-01-15 ENCOUNTER — Other Ambulatory Visit: Payer: Self-pay | Admitting: Family

## 2015-01-15 NOTE — Telephone Encounter (Signed)
Pt also states that she fell last week at home and could not get. States she placed a lot of weight on her knees trying to get up and now is having knee pain. Pt has history of knee replacement. Doesn't remember which orthopedic she saw. Please advise if pt needs OV with [place] or xrays or ortho referral?  Rx was faxed on 01/12/15. Spoke with Joe at [facility] and he states he never received Rx. Verbal given per previous Rx.

## 2015-01-15 NOTE — Telephone Encounter (Signed)
Please schedule patient for OV

## 2015-01-15 NOTE — Telephone Encounter (Signed)
Caller name: [employer]   Relationship to patient: Self  Can be reached: [phone]  Pharmacy: [facility] [place], [place].  Reason for call: pt is requesting a refill on her Duanne Moron she says that she will be at the pharmacy at around 11:30a. She would like to have it ready for pick up then so that she doesn't have to make 2 trips there.   Please advise

## 2015-01-15 NOTE — Telephone Encounter (Signed)
Spoke with pt and scheduled appt for 01/16/15 at 2:30pm.

## 2015-01-16 ENCOUNTER — Encounter (INDEPENDENT_AMBULATORY_CARE_PROVIDER_SITE_OTHER): Payer: Self-pay

## 2015-01-16 ENCOUNTER — Encounter: Payer: Self-pay | Admitting: Family

## 2015-01-16 ENCOUNTER — Ambulatory Visit (INDEPENDENT_AMBULATORY_CARE_PROVIDER_SITE_OTHER): Payer: Medicare Other | Admitting: Family

## 2015-01-16 ENCOUNTER — Ambulatory Visit (HOSPITAL_BASED_OUTPATIENT_CLINIC_OR_DEPARTMENT_OTHER)
Admission: RE | Admit: 2015-01-16 | Discharge: 2015-01-16 | Disposition: A | Discharge status: Home / Self Care | Payer: Medicare Other | Source: Ambulatory Visit | Attending: Family | Admitting: Family

## 2015-01-16 VITALS — BP 156/77 | HR 73 | Temp 98.2°F | Resp 18 | Ht 63.5 in | Wt 198.8 lb

## 2015-01-16 DIAGNOSIS — R0781 Pleurodynia: Secondary | ICD-10-CM | POA: Diagnosis not present

## 2015-01-16 DIAGNOSIS — M179 Osteoarthritis of knee, unspecified: Secondary | ICD-10-CM | POA: Diagnosis not present

## 2015-01-16 DIAGNOSIS — W19XXXA Unspecified fall, initial encounter: Secondary | ICD-10-CM

## 2015-01-16 DIAGNOSIS — M25562 Pain in left knee: Secondary | ICD-10-CM | POA: Diagnosis not present

## 2015-01-16 DIAGNOSIS — R300 Dysuria: Secondary | ICD-10-CM

## 2015-01-16 DIAGNOSIS — I2510 Atherosclerotic heart disease of native coronary artery without angina pectoris: Secondary | ICD-10-CM | POA: Diagnosis not present

## 2015-01-16 DIAGNOSIS — I951 Orthostatic hypotension: Secondary | ICD-10-CM | POA: Diagnosis not present

## 2015-01-16 LAB — POCT URINALYSIS DIPSTICK
BILIRUBIN UA: NEGATIVE
Blood, UA: NEGATIVE
GLUCOSE UA: NEGATIVE
KETONES UA: NEGATIVE
Nitrite, UA: POSITIVE
Protein, UA: NEGATIVE
SPEC GRAV UA: 1.025
Urobilinogen, UA: NEGATIVE
pH, UA: 6

## 2015-01-16 MED ORDER — METOPROLOL TARTRATE 50 MG PO TABS: 25.0000 mg | ORAL_TABLET | Freq: Two times a day (BID) | ORAL | Status: DC

## 2015-01-16 MED ORDER — CIPROFLOXACIN HCL 250 MG PO TABS: 250.0000 mg | ORAL_TABLET | Freq: Two times a day (BID) | ORAL | Status: DC

## 2015-01-16 NOTE — Patient Instructions (Addendum)
Please complete x rays on the first floor. Decrease metoprolol from 50mg  twice daily to 1/2 tab (25mg ) twice daily.  Sit on the edge of the bed for a few minutes before standing. Let me know when you are ready to begin physical therapy. Use your cane or walker at all times.

## 2015-01-16 NOTE — Progress Notes (Signed)
Pre visit review using our clinic review tool, if applicable. No additional management support is needed unless otherwise documented below in the visit note. 

## 2015-01-16 NOTE — Progress Notes (Signed)
Subjective:    Patient ID: Kimberly Lin, female    DOB: [birth year], 69 y.o.   MRN: [id number]  HPI  Kimberly Lin is a 69 yr old female who presents today following fall.  Fall occurred on 01/12/15 when she got up in the middle of the night. Took a few steps and fell.  Does not remember feeling dizzy.   Patient has left knee pain and right posterior rib pain/bruising following fall.    She had another accidental fall the day before which was described more as a mechanical fall.    Reports that she did have one episode or dysuria.   Did not have head trauma.    Review of Systems See HPI  Past Medical History  Diagnosis Date  . Arthritis     osteoarthritis  . Fibromyalgia   . History of chicken pox   . Depression   . Diabetes mellitus   . GERD (gastroesophageal reflux disease)   . Hyperlipidemia   . Hypertension   . Migraine   . Personal history of colonic polyps   . History of septic shock 12/2009  . Spinal stenosis   . OSA (obstructive sleep apnea) 03/12/2011  . Hiatal hernia   . Abdominal mass   . Anxiety   . Colitis 2014  . Abnormal nuclear stress test 07/05/2014    Social History   Social History  . Marital Status: Married    Spouse Name: N/A  . Number of Children: 2  . Years of Education: N/A   Occupational History  . Not on file.   Social History Main Topics  . Smoking status: Former Smoker    Types: Cigarettes  . Smokeless tobacco: Never Used     Comment: Smoked socially.  . Alcohol Use: 3.6 - 4.8 oz/week    6-8 Glasses of wine per week  . Drug Use: No  . Sexual Activity: Not on file   Other Topics Concern  . Not on file   Social History Narrative   Regular exercise:  No   Caffeine use: 1 cup daily          Past Surgical History  Procedure Laterality Date  . Breast biopsy Right 2007, 2009  . Appendectomy  1983  . Medial partial knee replacement Bilateral 2006  . Ovarian cyst removal  2008    Pt states she had a ?cyst removed ?ovaries    . Polypectomy  1998  . Colonoscopy  2014    normal   . Cardiac catheterization N/A 07/05/2014    Procedure: Left Heart Cath and Coronary Angiography;  Surgeon: Sherren Mocha, MD;  Location: [facility];  Service: Cardiovascular;  Laterality: N/A;    Family History  Problem Relation Age of Onset  . Arthritis Mother   . Hyperlipidemia Mother   . Hypertension Mother   . Diabetes Mother   . Heart disease Mother   . Lung cancer Brother   . Heart disease Brother     x 2  . Hypertension Brother   . Hyperlipidemia Brother   . Diabetes Brother   . Sudden death Father   . Diabetes Sister   . Hyperlipidemia Sister   . Hypertension Sister   . Diabetes Maternal Aunt   . Heart attack Maternal Aunt   . Hyperlipidemia Maternal Aunt   . Hypertension Maternal Aunt   . Hypertension Maternal Uncle   . Hyperlipidemia Maternal Uncle   . Heart attack Maternal Uncle   . Diabetes  Maternal Uncle   . Diabetes Paternal Aunt   . Heart attack Paternal Aunt   . Hyperlipidemia Paternal Aunt   . Hypertension Paternal Aunt   . Hypertension Paternal Uncle   . Hyperlipidemia Paternal Uncle   . Heart attack Paternal Uncle   . Diabetes Paternal Uncle   . Breast cancer Mother   . Lung cancer Father   . Rectal cancer Father   . Prostate cancer Brother     Allergies  Allergen Reactions  . Erythromycin Swelling    "all mycins"  . Prednisone Other (See Comments)    "Insomnia"    Current Outpatient Prescriptions on File Prior to Visit  Medication Sig Dispense Refill  . ALPRAZolam (XANAX) 1 MG tablet TAKE ONE TABLET BY MOUTH TWICE DAILY AS NEEDED FOR ANXIETY 60 tablet 0  . amLODipine (NORVASC) 5 MG tablet Take 1 tablet (5 mg total) by mouth daily. 30 tablet 5  . aspirin 81 MG tablet Take 81 mg by mouth daily.      Marland Kitchen atorvastatin (LIPITOR) 40 MG tablet Take 1 tablet (40 mg total) by mouth daily. 90 tablet 3  . Calcium Carbonate-Vitamin D (CALCIUM-D) 600-400 MG-UNIT TABS Take 1 tablet by mouth  daily.     . Coenzyme Q10 (CO Q 10) 100 MG CAPS Take 100 mg by mouth daily.    . diphenhydrAMINE (BENADRYL) 25 MG tablet Take 25 mg by mouth every 6 (six) hours as needed for itching.     . escitalopram (LEXAPRO) 10 MG tablet 1 tab by mouth once daily. 30 tablet 3  . gabapentin (NEURONTIN) 300 MG capsule Take 300 mg by mouth 2 (two) times daily.    Marland Kitchen losartan (COZAAR) 50 MG tablet TAKE ONE TABLET BY MOUTH ONCE DAILY 90 tablet 0  . meclizine (ANTIVERT) 50 MG tablet Take 1 tablet (50 mg total) by mouth 3 (three) times daily as needed. 30 tablet 0  . metFORMIN (GLUCOPHAGE) 500 MG tablet TAKE TWO TABLETS BY MOUTH TWICE DAILY WITH MEALS 120 tablet 5  . metoprolol (LOPRESSOR) 50 MG tablet Take 1 tablet (50 mg total) by mouth 2 (two) times daily. 60 tablet 2  . Multiple Vitamin (MULTIVITAMIN) tablet Take 1 tablet by mouth daily.      . Omega-3 Fatty Acids (FISH OIL) 1000 MG CAPS 2 caps by mouth twice daily (Patient taking differently: Take 2,000 mg by mouth 2 (two) times daily. )  0  . ondansetron (ZOFRAN ODT) 8 MG disintegrating tablet Take 1 tablet (8 mg total) by mouth every 8 (eight) hours as needed for nausea or vomiting. 20 tablet 0  . pantoprazole (PROTONIX) 40 MG tablet TAKE ONE TABLET BY MOUTH ONCE DAILY 90 tablet 1  . pioglitazone (ACTOS) 30 MG tablet TAKE ONE TABLET BY MOUTH ONCE DAILY 90 tablet 1  . venlafaxine XR (EFFEXOR-XR) 75 MG 24 hr capsule Take 2 capsules by mouth once a day. 60 capsule 3  . zolpidem (AMBIEN) 10 MG tablet TAKE ONE TABLET BY MOUTH AT BEDTIME AS NEEDED FOR SLEEP 30 tablet 0   No current facility-administered medications on file prior to visit.    BP 156/77 mmHg  Pulse 73  Temp(Src) 98.2 F (36.8 C) (Oral)  Resp 18  Ht 5' 3.5" (1.613 m)  Wt 198 lb 12.8 oz (90.175 kg)  BMI 34.66 kg/m2  SpO2 98%       Objective:   Physical Exam  Constitutional: She appears well-developed and well-nourished.  Cardiovascular: Normal rate, regular rhythm and normal  heart  sounds.   No murmur heard. Pulmonary/Chest: Effort normal and breath sounds normal. No respiratory distress. She has no wheezes.  Musculoskeletal:  L knee with mild medial swelling R knee without swelling  Skin:     + ecchymosis overlying left posterior rib cage  Psychiatric: She has a normal mood and affect. Her behavior is normal. Judgment and thought content normal.          Assessment & Plan:  Fall in setting of orthostatic hypotension-  BP 150/90 laying down to 128/98 standing. Suspect orthostasis is playing a role.  Decrease metoprolol from 50mg  bid to 25mg  bid.  Discussed PT referral, pt declines.    L knee pain- obtain x ray to further eval- if abnormal- refer to ortho.  L posterior rib pain- obtain rib x-ray.    Dysuria- obtain UA to rule out fracture.

## 2015-01-17 ENCOUNTER — Telehealth: Payer: Self-pay | Admitting: *Deleted

## 2015-01-17 NOTE — Telephone Encounter (Signed)
-----   Message from Debbrah Alar, NP sent at 01/16/2015  6:35 PM EST ----- Knee and ribs both look good on x-ray.  No obvious fractures noted.

## 2015-01-17 NOTE — Telephone Encounter (Signed)
Notified pt. She states she still has swelling of her knee and wanted to know how long it could take to heal. Per verbal from PCP, advised her swelling could last a couple more weeks and if no improvement at that time let us know and we will refer her to ortho. Pt voices understanding.

## 2015-01-19 ENCOUNTER — Telehealth: Payer: Self-pay | Admitting: Family

## 2015-01-19 LAB — URINE CULTURE: Colony Count: >=100000

## 2015-01-19 MED ORDER — SULFAMETHOXAZOLE-TRIMETHOPRIM 400-80 MG PO TABS: 1.0000 | ORAL_TABLET | Freq: Two times a day (BID) | ORAL | Status: DC

## 2015-01-19 NOTE — Telephone Encounter (Signed)
Reviewed urine culture. + for bacteria, resistant to the cipro we gave her.   D/c cipro, start bactrim.

## 2015-01-19 NOTE — Telephone Encounter (Signed)
Notified pt and she voices understanding. 

## 2015-01-30 DIAGNOSIS — M545 Low back pain: Secondary | ICD-10-CM | POA: Diagnosis not present

## 2015-01-30 DIAGNOSIS — M4727 Other spondylosis with radiculopathy, lumbosacral region: Secondary | ICD-10-CM | POA: Diagnosis not present

## 2015-02-10 ENCOUNTER — Other Ambulatory Visit: Payer: Self-pay | Admitting: Family

## 2015-02-13 ENCOUNTER — Other Ambulatory Visit: Payer: Self-pay | Admitting: Family

## 2015-02-13 ENCOUNTER — Other Ambulatory Visit: Payer: Self-pay | Admitting: *Deleted

## 2015-02-13 MED ORDER — ALPRAZOLAM 1 MG PO TABS: 1.0000 mg | ORAL_TABLET | Freq: Two times a day (BID) | ORAL | Status: DC | PRN

## 2015-02-13 MED ORDER — ZOLPIDEM TARTRATE 10 MG PO TABS: 10.0000 mg | ORAL_TABLET | Freq: Every evening | ORAL | Status: DC | PRN

## 2015-02-13 NOTE — Telephone Encounter (Signed)
Next UDS due 03/14/15.  Rx printed and forwarded to PCP for signature.

## 2015-02-13 NOTE — Telephone Encounter (Signed)
Last OV 01/16/15. Last refill 01/15/15 #60 and 0 refills.  Please advise.

## 2015-02-13 NOTE — Telephone Encounter (Signed)
PRESCRIBER NOT AT THIS PRACTICE!! Willette Pa

## 2015-02-13 NOTE — Telephone Encounter (Signed)
Caller name: Self  Can be reached: [phone] (M)\  Pharmacy:  [facility] [place], [place]. [phone] (Phone) [phone] (Fax)         Reason for call: Request refill on ALPRAZolam Duanne Moron) 1 MG tablet [id number]

## 2015-02-14 NOTE — Telephone Encounter (Signed)
Rxs were faxed to pharmacy this morning around 8:30am.

## 2015-02-28 DIAGNOSIS — Z6835 Body mass index (BMI) 35.0-35.9, adult: Secondary | ICD-10-CM | POA: Diagnosis not present

## 2015-02-28 DIAGNOSIS — M545 Low back pain: Secondary | ICD-10-CM | POA: Diagnosis not present

## 2015-02-28 DIAGNOSIS — M4727 Other spondylosis with radiculopathy, lumbosacral region: Secondary | ICD-10-CM | POA: Diagnosis not present

## 2015-03-14 ENCOUNTER — Ambulatory Visit: Payer: Medicare Other | Admitting: Family

## 2015-03-15 ENCOUNTER — Telehealth: Payer: Self-pay | Admitting: Family

## 2015-03-15 NOTE — Telephone Encounter (Signed)
Charge per [employer].

## 2015-03-15 NOTE — Telephone Encounter (Signed)
Pt was no show 03/14/15 10:30am for follow up appt, pt has not rescheduled, 1st no show I see, charge or no charge?

## 2015-03-15 NOTE — Telephone Encounter (Signed)
Yes please

## 2015-03-16 ENCOUNTER — Telehealth: Payer: Self-pay | Admitting: *Deleted

## 2015-03-16 NOTE — Telephone Encounter (Signed)
Marked to charge and mailing no show letter °

## 2015-03-16 NOTE — Telephone Encounter (Signed)
Received fax requesting refill of alprazolam. Last Rx 02/13/15. Pt was due for OV on 03/14/15 and did not show. UDS is due now. Please advise?

## 2015-03-17 NOTE — Telephone Encounter (Signed)
Ok to sent refill and obtain UDS please

## 2015-03-19 ENCOUNTER — Other Ambulatory Visit: Payer: Self-pay | Admitting: Family

## 2015-03-19 MED ORDER — ALPRAZOLAM 1 MG PO TABS: 1.0000 mg | ORAL_TABLET | Freq: Two times a day (BID) | ORAL | Status: DC | PRN

## 2015-03-19 NOTE — Telephone Encounter (Signed)
OK to waive No show fee.

## 2015-03-19 NOTE — Telephone Encounter (Signed)
Kimberly Lin-- pt says she cancelled last office visit because she was sick the night before with her "colitis" and she would like no show fee waived.  Please advise.  Rx called to pharmacy voicemail. Notified pt. She has r/s f/u for 03/21/15 at 10:45am. Will obtain UDS at that time.

## 2015-03-21 ENCOUNTER — Ambulatory Visit (INDEPENDENT_AMBULATORY_CARE_PROVIDER_SITE_OTHER): Payer: Medicare Other | Admitting: Family

## 2015-03-21 ENCOUNTER — Encounter: Payer: Self-pay | Admitting: Family

## 2015-03-21 VITALS — BP 141/73 | HR 91 | Temp 97.8°F | Resp 18 | Ht 63.5 in | Wt 202.8 lb

## 2015-03-21 DIAGNOSIS — F32A Depression, unspecified: Secondary | ICD-10-CM

## 2015-03-21 DIAGNOSIS — E119 Type 2 diabetes mellitus without complications: Secondary | ICD-10-CM

## 2015-03-21 DIAGNOSIS — I10 Essential (primary) hypertension: Secondary | ICD-10-CM | POA: Diagnosis not present

## 2015-03-21 DIAGNOSIS — R8290 Unspecified abnormal findings in urine: Secondary | ICD-10-CM

## 2015-03-21 DIAGNOSIS — R8299 Other abnormal findings in urine: Secondary | ICD-10-CM | POA: Diagnosis not present

## 2015-03-21 DIAGNOSIS — M545 Low back pain: Secondary | ICD-10-CM

## 2015-03-21 DIAGNOSIS — E1140 Type 2 diabetes mellitus with diabetic neuropathy, unspecified: Secondary | ICD-10-CM

## 2015-03-21 DIAGNOSIS — F329 Major depressive disorder, single episode, unspecified: Secondary | ICD-10-CM

## 2015-03-21 DIAGNOSIS — IMO0002 Reserved for concepts with insufficient information to code with codable children: Secondary | ICD-10-CM

## 2015-03-21 DIAGNOSIS — E1165 Type 2 diabetes mellitus with hyperglycemia: Secondary | ICD-10-CM

## 2015-03-21 LAB — POCT URINALYSIS DIPSTICK
Bilirubin, UA: NEGATIVE
Glucose, UA: NEGATIVE
Nitrite, UA: NEGATIVE
PH UA: 6
PROTEIN UA: NEGATIVE
RBC UA: NEGATIVE
SPEC GRAV UA: 1.02
Urobilinogen, UA: NEGATIVE

## 2015-03-21 LAB — BASIC METABOLIC PANEL
BUN: 24 mg/dL — ABNORMAL HIGH (ref 6–23)
CALCIUM: 9.4 mg/dL (ref 8.4–10.5)
CHLORIDE: 100 meq/L (ref 96–112)
CO2: 28 mEq/L (ref 19–32)
CREATININE: 0.8 mg/dL (ref 0.40–1.20)
GFR: 75.53 mL/min (ref >60.00)
Glucose, Bld: 126 mg/dL — ABNORMAL HIGH (ref 70–99)
Potassium: 4.1 mEq/L (ref 3.5–5.1)
Sodium: 137 mEq/L (ref 135–145)

## 2015-03-21 LAB — HEMOGLOBIN A1C: HEMOGLOBIN A1C: 6.5 % (ref 4.6–6.5)

## 2015-03-21 MED ORDER — CIPROFLOXACIN HCL 250 MG PO TABS: 250.0000 mg | ORAL_TABLET | Freq: Two times a day (BID) | ORAL | Status: DC

## 2015-03-21 MED ORDER — GABAPENTIN 300 MG PO CAPS: 300.0000 mg | ORAL_CAPSULE | Freq: Two times a day (BID) | ORAL | Status: DC

## 2015-03-21 NOTE — Assessment & Plan Note (Signed)
Clinically stable, obtain a1c. Continue metformin. She continues to work on weight loss. We discussed adding water aerobics.

## 2015-03-21 NOTE — Assessment & Plan Note (Signed)
Fair control, declines increase in lexapro, continue current dose, monitor.

## 2015-03-21 NOTE — Patient Instructions (Signed)
Please complete lab work prior to leaving.  Try to add exercise such as water aerobics.

## 2015-03-21 NOTE — Progress Notes (Signed)
Subjective:    Patient ID: Kimberly Lin, female    DOB: [birth year], 70 y.o.   MRN: [id number]  HPI   Ms.  [name] is a 70 yr old female who presents today for follow up.  1) HTN- on losartan, metoprolol.  BP Readings from Last 3 Encounters:  03/21/15 141/73  01/16/15 156/77  12/29/14 136/62   2) low back pain-  Across the lower back.  She reports very mild dysuria.  She saw her orthopedic doctor in [place].  Had knee and hand x-ray  3) Depression- maintained on lexapro. Feels like mood could be better.does not wish to adjust meds at this time.    4) Dm2- on metformin Lab Results  Component Value Date   HGBA1C 6.5 12/12/2014   HGBA1C 7.9* 03/24/2014   HGBA1C 7.1* 06/28/2013   Lab Results  Component Value Date   MICROALBUR 2.0* 03/24/2014   LDLCALC 73 12/12/2014   CREATININE 0.82 12/26/2014    Review of Systems Notes occasional leg cramping   Past Medical History  Diagnosis Date  . Arthritis     osteoarthritis  . Fibromyalgia   . History of chicken pox   . Depression   . Diabetes mellitus   . GERD (gastroesophageal reflux disease)   . Hyperlipidemia   . Hypertension   . Migraine   . Personal history of colonic polyps   . History of septic shock 12/2009  . Spinal stenosis   . OSA (obstructive sleep apnea) 03/12/2011  . Hiatal hernia   . Abdominal mass   . Anxiety   . Colitis 2014  . Abnormal nuclear stress test 07/05/2014    Social History   Social History  . Marital Status: Married    Spouse Name: N/A  . Number of Children: 2  . Years of Education: N/A   Occupational History  . Not on file.   Social History Main Topics  . Smoking status: Former Smoker    Types: Cigarettes  . Smokeless tobacco: Never Used     Comment: Smoked socially.  . Alcohol Use: 3.6 - 4.8 oz/week    6-8 Glasses of wine per week  . Drug Use: No  . Sexual Activity: Not on file   Other Topics Concern  . Not on file   Social History Narrative   Regular exercise:  No    Caffeine use: 1 cup daily          Past Surgical History  Procedure Laterality Date  . Breast biopsy Right 2007, 2009  . Appendectomy  1983  . Medial partial knee replacement Bilateral 2006  . Ovarian cyst removal  2008    Pt states she had a ?cyst removed ?ovaries  . Polypectomy  1998  . Colonoscopy  2014    normal   . Cardiac catheterization N/A 07/05/2014    Procedure: Left Heart Cath and Coronary Angiography;  Surgeon: Sherren Mocha, MD;  Location: [facility];  Service: Cardiovascular;  Laterality: N/A;    Family History  Problem Relation Age of Onset  . Arthritis Mother   . Hyperlipidemia Mother   . Hypertension Mother   . Diabetes Mother   . Heart disease Mother   . Lung cancer Brother   . Heart disease Brother     x 2  . Hypertension Brother   . Hyperlipidemia Brother   . Diabetes Brother   . Sudden death Father   . Diabetes Sister   . Hyperlipidemia Sister   .  Hypertension Sister   . Diabetes Maternal Aunt   . Heart attack Maternal Aunt   . Hyperlipidemia Maternal Aunt   . Hypertension Maternal Aunt   . Hypertension Maternal Uncle   . Hyperlipidemia Maternal Uncle   . Heart attack Maternal Uncle   . Diabetes Maternal Uncle   . Diabetes Paternal Aunt   . Heart attack Paternal Aunt   . Hyperlipidemia Paternal Aunt   . Hypertension Paternal Aunt   . Hypertension Paternal Uncle   . Hyperlipidemia Paternal Uncle   . Heart attack Paternal Uncle   . Diabetes Paternal Uncle   . Breast cancer Mother   . Lung cancer Father   . Rectal cancer Father   . Prostate cancer Brother     Allergies  Allergen Reactions  . Erythromycin Swelling    "all mycins"  . Prednisone Other (See Comments)    "Insomnia"    Current Outpatient Prescriptions on File Prior to Visit  Medication Sig Dispense Refill  . ALPRAZolam (XANAX) 1 MG tablet Take 1 tablet (1 mg total) by mouth 2 (two) times daily as needed. for anxiety 60 tablet 0  . amLODipine (NORVASC) 5 MG  tablet Take 1 tablet (5 mg total) by mouth daily. 30 tablet 5  . aspirin 81 MG tablet Take 81 mg by mouth daily.      Marland Kitchen atorvastatin (LIPITOR) 40 MG tablet Take 1 tablet (40 mg total) by mouth daily. 90 tablet 3  . Calcium Carbonate-Vitamin D (CALCIUM-D) 600-400 MG-UNIT TABS Take 1 tablet by mouth daily.     . Coenzyme Q10 (CO Q 10) 100 MG CAPS Take 100 mg by mouth daily.    . diphenhydrAMINE (BENADRYL) 25 MG tablet Take 25 mg by mouth every 6 (six) hours as needed for itching.     . escitalopram (LEXAPRO) 10 MG tablet TAKE ONE TABLET BY MOUTH ONCE DAILY 30 tablet 5  . losartan (COZAAR) 50 MG tablet TAKE ONE TABLET BY MOUTH ONCE DAILY 90 tablet 0  . metFORMIN (GLUCOPHAGE) 500 MG tablet TAKE TWO TABLETS BY MOUTH TWICE DAILY WITH MEALS 120 tablet 5  . metoprolol (LOPRESSOR) 50 MG tablet Take 0.5 tablets (25 mg total) by mouth 2 (two) times daily. (Patient taking differently: Take 50 mg by mouth daily. ) 60 tablet 2  . Multiple Vitamin (MULTIVITAMIN) tablet Take 1 tablet by mouth daily.      . Omega-3 Fatty Acids (FISH OIL) 1000 MG CAPS 2 caps by mouth twice daily (Patient taking differently: Take 2,000 mg by mouth 2 (two) times daily. )  0  . ondansetron (ZOFRAN ODT) 8 MG disintegrating tablet Take 1 tablet (8 mg total) by mouth every 8 (eight) hours as needed for nausea or vomiting. 20 tablet 0  . pantoprazole (PROTONIX) 40 MG tablet TAKE ONE TABLET BY MOUTH ONCE DAILY 90 tablet 1  . pioglitazone (ACTOS) 30 MG tablet TAKE ONE TABLET BY MOUTH ONCE DAILY 90 tablet 1  . venlafaxine XR (EFFEXOR-XR) 75 MG 24 hr capsule TAKE TWO CAPSULES BY MOUTH ONCE DAILY 60 capsule 5  . zolpidem (AMBIEN) 10 MG tablet Take 1 tablet (10 mg total) by mouth at bedtime as needed. for sleep 30 tablet 0   No current facility-administered medications on file prior to visit.    BP 141/73 mmHg  Pulse 91  Temp(Src) 97.8 F (36.6 C) (Oral)  Resp 18  Ht 5' 3.5" (1.613 m)  Wt 202 lb 12.8 oz (91.989 kg)  BMI 35.36 kg/m2   SpO2  96%       Objective:   Physical Exam  Constitutional: She is oriented to person, place, and time. She appears well-developed and well-nourished.  Cardiovascular: Normal rate, regular rhythm and normal heart sounds.   No murmur heard. Pulmonary/Chest: Effort normal and breath sounds normal. No respiratory distress. She has no wheezes.  Musculoskeletal: She exhibits no edema.  Neurological: She is alert and oriented to person, place, and time.  Psychiatric: She has a normal mood and affect. Her behavior is normal. Judgment and thought content normal.          Assessment & Plan:  Abnormal finding on UA- + leuks.  + low back pain, start empiric rx for UTI with cipro, send urine for culture.

## 2015-03-21 NOTE — Progress Notes (Signed)
Pre visit review using our clinic review tool, if applicable. No additional management support is needed unless otherwise documented below in the visit note. 

## 2015-03-21 NOTE — Assessment & Plan Note (Signed)
Stable on current meds. Continue same. Obtain bmet. 

## 2015-03-23 ENCOUNTER — Telehealth: Payer: Self-pay | Admitting: Family

## 2015-03-23 LAB — URINE CULTURE

## 2015-03-23 NOTE — Telephone Encounter (Signed)
Opened in error

## 2015-03-26 ENCOUNTER — Encounter: Payer: Self-pay | Admitting: Family

## 2015-03-28 ENCOUNTER — Telehealth: Payer: Self-pay | Admitting: Family

## 2015-03-28 NOTE — Telephone Encounter (Signed)
Notified pt. 

## 2015-03-28 NOTE — Telephone Encounter (Signed)
3 days should be adequate.

## 2015-03-28 NOTE — Telephone Encounter (Signed)
Notified pt via 03/26/15 lab letter. Pt wants to know if she needs a longer course of antibiotic as she only had Rx for 3 days. States she wasn't really having symptoms before and doesn't have any now.

## 2015-03-28 NOTE — Telephone Encounter (Signed)
Pt called in to get lab results.   [id number].

## 2015-04-02 ENCOUNTER — Telehealth: Payer: Self-pay | Admitting: Family

## 2015-04-02 NOTE — Telephone Encounter (Signed)
Caller name: Self  Can be reached: [phone]   Reason for call: Patient states that she is having new pain in her lower leg. Fear of Blood clot. Offered her an appointment but patient declined and stated that she wanted to speak with the Nurse to find out if she needs to be seen.

## 2015-04-02 NOTE — Telephone Encounter (Signed)
Notified pt. She states she is not going to the ER tonight. I advised pt that I have to cancel her appt with PCP for tomorrow due to below recommendation and she should be evaluated as early as possible.  Pt voices understanding.

## 2015-04-02 NOTE — Telephone Encounter (Signed)
I think pt needs evaluation today. I would recommend that she proceed to ED for further evaluation to rule out clot.

## 2015-04-02 NOTE — Telephone Encounter (Signed)
Spoke with pt. States she has soreness to touch on side of lower right leg that started yesterday. Pain / tenderness now in back of calf. Denies swelling or redness and is "not able to feel a knot". Pt denies chest pain or shortness of breath. Scheduled pt appt with PCP for tomorrow at 1:15pm. Advised pt to be evaluated in the ER if symptoms worsen. Please advise if other recommendations?

## 2015-04-03 ENCOUNTER — Ambulatory Visit: Payer: Medicare Other | Admitting: Family

## 2015-04-13 ENCOUNTER — Telehealth: Payer: Self-pay | Admitting: *Deleted

## 2015-04-13 MED ORDER — ALPRAZOLAM 1 MG PO TABS: 1.0000 mg | ORAL_TABLET | Freq: Two times a day (BID) | ORAL | Status: DC | PRN

## 2015-04-13 NOTE — Telephone Encounter (Signed)
Received fax from [facility] requesting refill of Xanax 1mg  twice daily as needed for anxiety. Rx called to pharmacy voicemail. Pt just seen 03/21/15 and has no future appts scheduled. When should pt follow up in office?

## 2015-04-16 NOTE — Telephone Encounter (Signed)
Called pt. LVM to scheduled fu per PCP

## 2015-04-16 NOTE — Telephone Encounter (Signed)
Kimberly Lin, please schedule pt for follow-up appt 09/18/15.

## 2015-04-16 NOTE — Telephone Encounter (Signed)
09/18/15 please.

## 2015-04-24 DIAGNOSIS — M4727 Other spondylosis with radiculopathy, lumbosacral region: Secondary | ICD-10-CM | POA: Diagnosis not present

## 2015-04-24 DIAGNOSIS — M5416 Radiculopathy, lumbar region: Secondary | ICD-10-CM | POA: Diagnosis not present

## 2015-05-15 ENCOUNTER — Other Ambulatory Visit: Payer: Self-pay | Admitting: Family

## 2015-05-15 NOTE — Telephone Encounter (Signed)
Last OV: 03/21/15 Last filled: 04/13/15, #60, 0 RF Sig: Take 1 tablet (1 mg total) by mouth 2 (two) times daily as needed. for anxiety UDS: 12/12/14, pos for Xanax and Ambien, mod risk

## 2015-05-15 NOTE — Telephone Encounter (Signed)
Ok

## 2015-05-16 NOTE — Telephone Encounter (Signed)
Refill called to pharmacy voicemail. 

## 2015-05-20 ENCOUNTER — Other Ambulatory Visit: Payer: Self-pay | Admitting: Family

## 2015-05-20 NOTE — Telephone Encounter (Signed)
Looks like refill was sent on 4/5, could you please confirm with pharmacy?

## 2015-05-21 NOTE — Telephone Encounter (Signed)
I called and spoke with [place] and the Rx was received on 05/16/15 and has been picked up by the patient.    KP

## 2015-05-23 DIAGNOSIS — Z6836 Body mass index (BMI) 36.0-36.9, adult: Secondary | ICD-10-CM | POA: Diagnosis not present

## 2015-05-23 DIAGNOSIS — M545 Low back pain: Secondary | ICD-10-CM | POA: Diagnosis not present

## 2015-05-23 DIAGNOSIS — M4727 Other spondylosis with radiculopathy, lumbosacral region: Secondary | ICD-10-CM | POA: Diagnosis not present

## 2015-05-25 ENCOUNTER — Other Ambulatory Visit: Payer: Self-pay | Admitting: Family

## 2015-05-26 ENCOUNTER — Emergency Department (HOSPITAL_BASED_OUTPATIENT_CLINIC_OR_DEPARTMENT_OTHER): Payer: Medicare Other

## 2015-05-26 ENCOUNTER — Encounter (HOSPITAL_BASED_OUTPATIENT_CLINIC_OR_DEPARTMENT_OTHER): Payer: Self-pay | Admitting: Emergency Medicine

## 2015-05-26 ENCOUNTER — Emergency Department (HOSPITAL_BASED_OUTPATIENT_CLINIC_OR_DEPARTMENT_OTHER)
Admission: EM | Admit: 2015-05-26 | Discharge: 2015-05-26 | Disposition: A | Discharge status: Home / Self Care | Payer: Medicare Other | Attending: Emergency Medicine | Admitting: Emergency Medicine

## 2015-05-26 DIAGNOSIS — M1990 Unspecified osteoarthritis, unspecified site: Secondary | ICD-10-CM | POA: Insufficient documentation

## 2015-05-26 DIAGNOSIS — Y92009 Unspecified place in unspecified non-institutional (private) residence as the place of occurrence of the external cause: Secondary | ICD-10-CM | POA: Insufficient documentation

## 2015-05-26 DIAGNOSIS — W01198A Fall on same level from slipping, tripping and stumbling with subsequent striking against other object, initial encounter: Secondary | ICD-10-CM | POA: Insufficient documentation

## 2015-05-26 DIAGNOSIS — Z7984 Long term (current) use of oral hypoglycemic drugs: Secondary | ICD-10-CM | POA: Insufficient documentation

## 2015-05-26 DIAGNOSIS — S42002A Fracture of unspecified part of left clavicle, initial encounter for closed fracture: Secondary | ICD-10-CM

## 2015-05-26 DIAGNOSIS — Z792 Long term (current) use of antibiotics: Secondary | ICD-10-CM | POA: Diagnosis not present

## 2015-05-26 DIAGNOSIS — I10 Essential (primary) hypertension: Secondary | ICD-10-CM | POA: Diagnosis not present

## 2015-05-26 DIAGNOSIS — Z9889 Other specified postprocedural states: Secondary | ICD-10-CM | POA: Insufficient documentation

## 2015-05-26 DIAGNOSIS — Z7982 Long term (current) use of aspirin: Secondary | ICD-10-CM | POA: Insufficient documentation

## 2015-05-26 DIAGNOSIS — F419 Anxiety disorder, unspecified: Secondary | ICD-10-CM | POA: Insufficient documentation

## 2015-05-26 DIAGNOSIS — G43909 Migraine, unspecified, not intractable, without status migrainosus: Secondary | ICD-10-CM | POA: Insufficient documentation

## 2015-05-26 DIAGNOSIS — Z87891 Personal history of nicotine dependence: Secondary | ICD-10-CM | POA: Insufficient documentation

## 2015-05-26 DIAGNOSIS — S42032A Displaced fracture of lateral end of left clavicle, initial encounter for closed fracture: Secondary | ICD-10-CM | POA: Insufficient documentation

## 2015-05-26 DIAGNOSIS — S40012A Contusion of left shoulder, initial encounter: Secondary | ICD-10-CM | POA: Insufficient documentation

## 2015-05-26 DIAGNOSIS — K219 Gastro-esophageal reflux disease without esophagitis: Secondary | ICD-10-CM | POA: Insufficient documentation

## 2015-05-26 DIAGNOSIS — S4992XA Unspecified injury of left shoulder and upper arm, initial encounter: Secondary | ICD-10-CM | POA: Diagnosis present

## 2015-05-26 DIAGNOSIS — R110 Nausea: Secondary | ICD-10-CM | POA: Diagnosis not present

## 2015-05-26 DIAGNOSIS — M797 Fibromyalgia: Secondary | ICD-10-CM | POA: Diagnosis not present

## 2015-05-26 DIAGNOSIS — Z79899 Other long term (current) drug therapy: Secondary | ICD-10-CM | POA: Diagnosis not present

## 2015-05-26 DIAGNOSIS — E785 Hyperlipidemia, unspecified: Secondary | ICD-10-CM | POA: Diagnosis not present

## 2015-05-26 DIAGNOSIS — Y998 Other external cause status: Secondary | ICD-10-CM | POA: Insufficient documentation

## 2015-05-26 DIAGNOSIS — F329 Major depressive disorder, single episode, unspecified: Secondary | ICD-10-CM | POA: Diagnosis not present

## 2015-05-26 DIAGNOSIS — E119 Type 2 diabetes mellitus without complications: Secondary | ICD-10-CM | POA: Diagnosis not present

## 2015-05-26 DIAGNOSIS — Y9389 Activity, other specified: Secondary | ICD-10-CM | POA: Diagnosis not present

## 2015-05-26 DIAGNOSIS — Z8619 Personal history of other infectious and parasitic diseases: Secondary | ICD-10-CM | POA: Insufficient documentation

## 2015-05-26 DIAGNOSIS — Z86010 Personal history of colonic polyps: Secondary | ICD-10-CM | POA: Diagnosis not present

## 2015-05-26 DIAGNOSIS — M25512 Pain in left shoulder: Secondary | ICD-10-CM | POA: Diagnosis not present

## 2015-05-26 DIAGNOSIS — S42022A Displaced fracture of shaft of left clavicle, initial encounter for closed fracture: Secondary | ICD-10-CM | POA: Diagnosis not present

## 2015-05-26 MED ORDER — OXYCODONE-ACETAMINOPHEN 5-325 MG PO TABS: 1.0000 | ORAL_TABLET | ORAL | Status: DC | PRN

## 2015-05-26 MED ORDER — OXYCODONE-ACETAMINOPHEN 5-325 MG PO TABS
1.0000 | ORAL_TABLET | Freq: Once | ORAL | Status: AC
  Administered 2015-05-26: 1 via ORAL
  Filled 2015-05-26: qty 1

## 2015-05-26 NOTE — Discharge Instructions (Signed)
You were seen and evaluated today for your shoulder pain after your fall. It appears as though you fractured her clavicle. Use the arm sling. Ice the area and follow-up with orthopedics outpatient. Return with worsening symptoms, feeling like the bone is sticking up through the skin, weakness or numbness in your arm.  Clavicle Fracture The clavicle, also called the collarbone, is the long bone that connects your shoulder to your rib cage. You can feel your collarbone at the top of your shoulders and rib cage. A clavicle fracture is a broken clavicle. It is a common injury that can happen at any age.  CAUSES Common causes of a clavicle fracture include:  A direct blow to your shoulder.  A car accident.  A fall, especially if you try to break your fall with an outstretched arm. RISK FACTORS You may be at increased risk if:  You are younger than 25 years or older than 41 years. Most clavicle fractures happen to people who are younger than 25 years.  You are a female.  You play contact sports. SIGNS AND SYMPTOMS A fractured clavicle is painful. It also makes it hard to move your arm. Other signs and symptoms may include:  A shoulder that drops downward and forward.  Pain when trying to lift your shoulder.  Bruising, swelling, and tenderness over your clavicle.  A grinding noise when you try to move your shoulder.  A bump over your clavicle. DIAGNOSIS Your health care provider can usually diagnose a clavicle fracture by asking about your injury and examining your shoulder and clavicle. He or she may take an X-ray to determine the position of your clavicle. TREATMENT Treatment depends on the position of your clavicle after the fracture:  If the broken ends of the bone are not out of place, your health care provider may put your arm in a sling or wrap a support bandage around your chest (figure-of-eight wrap).  If the broken ends of the bone are out of place, you may need surgery.  Surgery may involve placing screws, pins, or plates to keep your clavicle stable while it heals. Healing may take about 3 months. When your health care provider thinks your fracture has healed enough, you may have to do physical therapy to regain normal movement and build up your arm strength. HOME CARE INSTRUCTIONS   Apply ice to the injured area:  Put ice in a plastic bag.  Place a towel between your skin and the bag.  Leave the ice on for 20 minutes, 2-3 times a day.  If you have a wrap or splint:  Wear it all the time, and remove it only to take a bath or shower.  When you bathe or shower, keep your shoulder in the same position as when the sling or wrap is on.  Do not lift your arm.  If you have a figure-of-eight wrap:  Another person must tighten it every day.  It should be tight enough to hold your shoulders back.  Allow enough room to place your index finger between your body and the strap.  Loosen the wrap immediately if you feel numbness or tingling in your hands.  Only take medicines as directed by your health care provider.  Avoid activities that make the injury or pain worse for 4-6 weeks after surgery.  Keep all follow-up appointments. SEEK MEDICAL CARE IF:  Your medicine is not helping to relieve pain and swelling. SEEK IMMEDIATE MEDICAL CARE IF:  Your arm is numb, cold,  or pale, even when the splint is loose. MAKE SURE YOU:   Understand these instructions.  Will watch your condition.  Will get help right away if you are not doing well or get worse.   This information is not intended to replace advice given to you by your health care provider. Make sure you discuss any questions you have with your health care provider.   Document Released: 11/06/2004 Document Revised: 02/01/2013 Document Reviewed: 12/20/2012 Elsevier Interactive Patient Education [employer].

## 2015-05-26 NOTE — ED Provider Notes (Signed)
CSN: [id number]     Arrival date & time 05/26/15  1313 History  By signing my name below, I, Altamease Oiler, attest that this documentation has been prepared under the direction and in the presence of Harvel Quale, MD. Electronically Signed: Altamease Oiler, ED Scribe. 05/26/2015. 4:21 PM    Chief Complaint  Patient presents with  . Shoulder Pain   The history is provided by the patient. No language interpreter was used.   Kimberly Lin is a 70 y.o. female who presents to the Emergency Department complaining of a fall this morning at home. The pt tripped over a rug and struck her left shoulder on a fireplace. Associated symptoms include left shoulder pain that radiates down the arm and nausea. No head injury or LOC. Pt denies other injury. She has been ambulatory since the fall. Apart from aspirin she is not on a blood thinner.   Past Medical History  Diagnosis Date  . Arthritis     osteoarthritis  . Fibromyalgia   . History of chicken pox   . Depression   . Diabetes mellitus   . GERD (gastroesophageal reflux disease)   . Hyperlipidemia   . Hypertension   . Migraine   . Personal history of colonic polyps   . History of septic shock 12/2009  . Spinal stenosis   . OSA (obstructive sleep apnea) 03/12/2011  . Hiatal hernia   . Abdominal mass   . Anxiety   . Colitis 2014  . Abnormal nuclear stress test 07/05/2014   Past Surgical History  Procedure Laterality Date  . Breast biopsy Right 2007, 2009  . Appendectomy  1983  . Medial partial knee replacement Bilateral 2006  . Ovarian cyst removal  2008    Pt states she had a ?cyst removed ?ovaries  . Polypectomy  1998  . Colonoscopy  2014    normal   . Cardiac catheterization N/A 07/05/2014    Procedure: Left Heart Cath and Coronary Angiography;  Surgeon: Sherren Mocha, MD;  Location: [facility];  Service: Cardiovascular;  Laterality: N/A;   Family History  Problem Relation Age of Onset  . Arthritis Mother   .  Hyperlipidemia Mother   . Hypertension Mother   . Diabetes Mother   . Heart disease Mother   . Lung cancer Brother   . Heart disease Brother     x 2  . Hypertension Brother   . Hyperlipidemia Brother   . Diabetes Brother   . Sudden death Father   . Diabetes Sister   . Hyperlipidemia Sister   . Hypertension Sister   . Diabetes Maternal Aunt   . Heart attack Maternal Aunt   . Hyperlipidemia Maternal Aunt   . Hypertension Maternal Aunt   . Hypertension Maternal Uncle   . Hyperlipidemia Maternal Uncle   . Heart attack Maternal Uncle   . Diabetes Maternal Uncle   . Diabetes Paternal Aunt   . Heart attack Paternal Aunt   . Hyperlipidemia Paternal Aunt   . Hypertension Paternal Aunt   . Hypertension Paternal Uncle   . Hyperlipidemia Paternal Uncle   . Heart attack Paternal Uncle   . Diabetes Paternal Uncle   . Breast cancer Mother   . Lung cancer Father   . Rectal cancer Father   . Prostate cancer Brother    Social History  Substance Use Topics  . Smoking status: Former Smoker    Types: Cigarettes  . Smokeless tobacco: Never Used  Comment: Smoked socially.  . Alcohol Use: 3.6 - 4.8 oz/week    6-8 Glasses of wine per week   OB History    No data available     Review of Systems  Gastrointestinal: Positive for nausea. Negative for vomiting.  Musculoskeletal: Positive for arthralgias.  Skin: Negative for wound.  Neurological: Negative for syncope.  All other systems reviewed and are negative.  Allergies  Erythromycin and Prednisone  Home Medications   Prior to Admission medications   Medication Sig Start Date End Date Taking? Authorizing Provider  ALPRAZolam Duanne Moron) 1 MG tablet TAKE ONE TABLET BY MOUTH TWICE DAILY 05/16/15  Yes Debbrah Alar, NP  amLODipine (NORVASC) 5 MG tablet Take 1 tablet (5 mg total) by mouth daily. 01/11/15  Yes Debbrah Alar, NP  atorvastatin (LIPITOR) 40 MG tablet Take 1 tablet (40 mg total) by mouth daily. 09/21/14  Yes [name], MD  Calcium Carbonate-Vitamin D (CALCIUM-D) 600-400 MG-UNIT TABS Take 1 tablet by mouth daily.    Yes Historical Provider, MD  Coenzyme Q10 (CO Q 10) 100 MG CAPS Take 100 mg by mouth daily.   Yes Historical Provider, MD  diphenhydrAMINE (BENADRYL) 25 MG tablet Take 25 mg by mouth every 6 (six) hours as needed for itching.    Yes Historical Provider, MD  escitalopram (LEXAPRO) 10 MG tablet TAKE ONE TABLET BY MOUTH ONCE DAILY 03/20/15  Yes Debbrah Alar, NP  gabapentin (NEURONTIN) 300 MG capsule Take 1 capsule (300 mg total) by mouth 2 (two) times daily. Reported on 03/21/2015 03/21/15  Yes Debbrah Alar, NP  losartan (COZAAR) 50 MG tablet TAKE ONE TABLET BY MOUTH ONCE DAILY 12/19/14  Yes Yvonne R Lowne Chase, DO  metFORMIN (GLUCOPHAGE) 500 MG tablet TAKE TWO TABLETS BY MOUTH TWICE DAILY WITH MEALS 01/12/15  Yes Debbrah Alar, NP  metoprolol (LOPRESSOR) 50 MG tablet Take 0.5 tablets (25 mg total) by mouth 2 (two) times daily. Patient taking differently: Take 50 mg by mouth daily.  01/16/15  Yes Debbrah Alar, NP  Multiple Vitamin (MULTIVITAMIN) tablet Take 1 tablet by mouth daily.     Yes Historical Provider, MD  pantoprazole (PROTONIX) 40 MG tablet TAKE ONE TABLET BY MOUTH ONCE DAILY 12/12/14  Yes Debbrah Alar, NP  pioglitazone (ACTOS) 30 MG tablet TAKE ONE TABLET BY MOUTH ONCE DAILY 01/12/15  Yes Debbrah Alar, NP  venlafaxine XR (EFFEXOR-XR) 75 MG 24 hr capsule TAKE TWO CAPSULES BY MOUTH ONCE DAILY 02/12/15  Yes Debbrah Alar, NP  zolpidem (AMBIEN) 10 MG tablet Take 1 tablet (10 mg total) by mouth at bedtime as needed. for sleep 02/13/15  Yes Debbrah Alar, NP  aspirin 81 MG tablet Take 81 mg by mouth daily.      Historical Provider, MD  ciprofloxacin (CIPRO) 250 MG tablet Take 1 tablet (250 mg total) by mouth 2 (two) times daily. 03/21/15   Debbrah Alar, NP  Omega-3 Fatty Acids (FISH OIL) 1000 MG CAPS 2 caps by mouth twice daily Patient taking differently:  Take 2,000 mg by mouth 2 (two) times daily.  04/16/11   Debbrah Alar, NP  ondansetron (ZOFRAN ODT) 8 MG disintegrating tablet Take 1 tablet (8 mg total) by mouth every 8 (eight) hours as needed for nausea or vomiting. 12/19/14   Pattricia Boss, MD  oxyCODONE-acetaminophen (PERCOCET/ROXICET) 5-325 MG tablet Take 1 tablet by mouth every 4 (four) hours as needed for moderate pain or severe pain. 05/26/15   Harvel Quale, MD   BP 160/96 mmHg  Pulse 96  Temp(Src) 98.3 F (36.8 C) (Oral)  Resp 20  Ht 5' 3.5" (1.613 m)  Wt 200 lb (90.719 kg)  BMI 34.87 kg/m2  SpO2 98% Physical Exam  Constitutional: She is oriented to person, place, and time. She appears well-developed and well-nourished. No distress.  HENT:  Head: Normocephalic and atraumatic.  Eyes: EOM are normal.  Neck: Normal range of motion.  Cardiovascular: Normal rate, regular rhythm and normal heart sounds.   Pulmonary/Chest: Effort normal and breath sounds normal.  Abdominal: Soft. She exhibits no distension. There is no tenderness.  Musculoskeletal:  Muscular pain over the SCM on the left Tenderness over the anterior shoulder and the posterior shoulder Limited abduction of the shoulder Full grip strength in the left hand   Neurological: She is alert and oriented to person, place, and time.  Skin: Skin is warm and dry.  Bruise to the anterior left shoulder   Psychiatric: She has a normal mood and affect. Judgment normal.  Nursing note and vitals reviewed.   ED Course  Procedures (including critical care time) DIAGNOSTIC STUDIES: Oxygen Saturation is 98% on RA,  normal by my interpretation.    COORDINATION OF CARE: 3:22 PM Discussed treatment plan which includes left shoulder XR and antiemetic with pt at bedside and pt agreed to plan.  Labs Review Labs Reviewed - No data to display  Imaging Review Dg Shoulder Left  05/26/2015  CLINICAL DATA:  Left shoulder pain after falling over a rug today. EXAM: LEFT SHOULDER -  2+ VIEW COMPARISON:  None. FINDINGS: Distal left clavicle fracture with more than 1 shaft width of superior displacement of the distal fragment. There is also 1.6 cm of overlapping of the fragments. Mild inferior angulation of the distal fragment. Moderate inferior glenohumeral spur formation with marked joint space narrowing. IMPRESSION: 1. Displaced distal left clavicle fracture, as described above. 2. Moderate left glenohumeral joint degenerative changes.  In Electronically Signed   By: Claudie Revering M.D.   On: 05/26/2015 15:32   I have personally reviewed and evaluated these images as part of my medical decision-making.   EKG Interpretation None      MDM  Patient was seen and evaluated in stable condition. Patient neurovascularly intact. Patient with clavicular fracture without skin tenting. Patient was placed in an arm sling and instructed to follow-up with orthopedics outpatient. She was provided with a prescription for Norco. Strict return precautions were given. She expressed understanding and agreement with plan of care. Final diagnoses:  Clavicle fracture, left, closed, initial encounter    1. Left clavicle fracture  I personally performed the services described in this documentation, which was scribed in my presence. The recorded information has been reviewed and is accurate.   Harvel Quale, MD 05/27/15 [phone]

## 2015-05-26 NOTE — ED Notes (Signed)
Pt tripped on rug in her home, hitting posterior left shoulder on fireplace mantel.

## 2015-05-28 DIAGNOSIS — S42032A Displaced fracture of lateral end of left clavicle, initial encounter for closed fracture: Secondary | ICD-10-CM | POA: Diagnosis not present

## 2015-05-28 MED ORDER — METOPROLOL TARTRATE 50 MG PO TABS: 50.0000 mg | ORAL_TABLET | Freq: Every day | ORAL | Status: DC

## 2015-05-28 NOTE — Telephone Encounter (Signed)
Metoprolol request from pharmacy was for 1 tablet twice a day. Current record indicates 1 tablet daily and verified that with pt. Rx sent with new direction.

## 2015-06-04 DIAGNOSIS — S42032D Displaced fracture of lateral end of left clavicle, subsequent encounter for fracture with routine healing: Secondary | ICD-10-CM | POA: Diagnosis not present

## 2015-06-13 DIAGNOSIS — M5126 Other intervertebral disc displacement, lumbar region: Secondary | ICD-10-CM | POA: Diagnosis not present

## 2015-06-13 DIAGNOSIS — M545 Low back pain: Secondary | ICD-10-CM | POA: Diagnosis not present

## 2015-06-19 ENCOUNTER — Other Ambulatory Visit: Payer: Self-pay | Admitting: Family

## 2015-06-19 NOTE — Telephone Encounter (Signed)
Last alprazolam Rx 05/16/15, #60. UDS:  Moderate and was due in 03/2015.  Next OV:  09/18/15  Rx printed and forwarded to PCP for signature.  Pt will need to pick up Rx and provide UDS at that time.

## 2015-06-19 NOTE — Telephone Encounter (Signed)
Rx placed at front desk for pick up and detailed message left on pt's cell# that Rx is ready and UDS is due. Will need to pick up Rx.

## 2015-06-21 ENCOUNTER — Other Ambulatory Visit: Payer: Self-pay | Admitting: Family

## 2015-06-21 ENCOUNTER — Encounter: Payer: Self-pay | Admitting: Family Medicine

## 2015-06-21 NOTE — Telephone Encounter (Signed)
Pt called in to follow up on medication refill. Advised that Rx is awaiting a signature. Pt says that she has 1 pill left.   Please assist further.

## 2015-07-04 DIAGNOSIS — S42032D Displaced fracture of lateral end of left clavicle, subsequent encounter for fracture with routine healing: Secondary | ICD-10-CM | POA: Diagnosis not present

## 2015-07-16 DIAGNOSIS — M4806 Spinal stenosis, lumbar region: Secondary | ICD-10-CM | POA: Diagnosis not present

## 2015-07-23 DIAGNOSIS — Z1231 Encounter for screening mammogram for malignant neoplasm of breast: Secondary | ICD-10-CM | POA: Diagnosis not present

## 2015-07-24 ENCOUNTER — Other Ambulatory Visit: Payer: Self-pay | Admitting: Family

## 2015-07-25 ENCOUNTER — Telehealth: Payer: Self-pay | Admitting: *Deleted

## 2015-07-25 NOTE — Telephone Encounter (Signed)
Rx faxed to pharmacy  

## 2015-07-25 NOTE — Telephone Encounter (Signed)
Last zolpidem Rx: 02/13/15 Last OV: 03/21/15 Next OV: 09/2015 UDS:  Moderate and due at next OV  Rx printed and forwarded to covering Provider for signature.

## 2015-07-25 NOTE — Telephone Encounter (Signed)
Requesting Xanax 1mg -Take 1 tablet by mouth twice daily as needed for anxiety. Last refill:06/22/15;#60,0-Filled by Dr. Lorelei Pont for Kimberly Lin Last OV:03/21/15 with Kimberly Lin UDS:12/12/14-Moderate risk-Next screen:03/14/15 (Due) Please advise.//AB/CMA

## 2015-07-26 ENCOUNTER — Other Ambulatory Visit: Payer: Self-pay | Admitting: Family

## 2015-07-26 MED ORDER — ALPRAZOLAM 1 MG PO TABS: 1.0000 mg | ORAL_TABLET | Freq: Two times a day (BID) | ORAL | Status: DC | PRN

## 2015-07-26 NOTE — Telephone Encounter (Signed)
Filling rx for Kimberly Lin. I have never seen pt. So giving 2 wks of med. For further refills get from pcp. Note her designated risk level. She can pick up rx.

## 2015-07-26 NOTE — Telephone Encounter (Signed)
I printed the rx. But it did not print. So will you call it in to her pharmacy?

## 2015-07-26 NOTE — Telephone Encounter (Signed)
Left message for pt that she was due for a follow up appointment with [facility] and that her Xanax was sent to [facility] for her and to call back with any questions.

## 2015-07-29 ENCOUNTER — Other Ambulatory Visit: Payer: Self-pay | Admitting: Medical

## 2015-07-30 NOTE — Telephone Encounter (Signed)
Rx refused since last Rx was faxed on 07/26/15. Refill will be too soon.

## 2015-08-01 DIAGNOSIS — S42032D Displaced fracture of lateral end of left clavicle, subsequent encounter for fracture with routine healing: Secondary | ICD-10-CM | POA: Diagnosis not present

## 2015-08-13 MED ORDER — ALPRAZOLAM 1 MG PO TABS: 1.0000 mg | ORAL_TABLET | Freq: Two times a day (BID) | ORAL | Status: DC | PRN

## 2015-08-13 NOTE — Telephone Encounter (Signed)
Rx faxed to pharmacy at 2:17pm.

## 2015-08-13 NOTE — Telephone Encounter (Signed)
Pt has follow up scheduled for 09/18/15. Last alprazolam Rx 07/26/15, #30 (15 day supply). Rx printed and forwarded to PCP for signature.

## 2015-08-13 NOTE — Telephone Encounter (Addendum)
Patient in need of clarification regarding direction for ALPRAZolam (XANAX) 1 MG tablet. Requesting a refill  [facility] [place], [place]. [phone] (Phone) [phone] (Fax)      Best #  [id number]

## 2015-08-13 NOTE — Addendum Note (Signed)
Addended by: Kelle Darting A on: 08/13/2015 02:14 PM   Modules accepted: Orders

## 2015-08-20 ENCOUNTER — Other Ambulatory Visit: Payer: Self-pay | Admitting: Internal Medicine

## 2015-08-21 NOTE — Telephone Encounter (Signed)
Pt called in to follow up on med refill request. Informed that it has been received and sent to  [facility] for PCP

## 2015-08-23 DIAGNOSIS — Z6836 Body mass index (BMI) 36.0-36.9, adult: Secondary | ICD-10-CM | POA: Diagnosis not present

## 2015-08-23 DIAGNOSIS — I10 Essential (primary) hypertension: Secondary | ICD-10-CM | POA: Diagnosis not present

## 2015-08-23 DIAGNOSIS — M545 Low back pain: Secondary | ICD-10-CM | POA: Diagnosis not present

## 2015-08-23 DIAGNOSIS — M4806 Spinal stenosis, lumbar region: Secondary | ICD-10-CM | POA: Diagnosis not present

## 2015-08-23 DIAGNOSIS — M4727 Other spondylosis with radiculopathy, lumbosacral region: Secondary | ICD-10-CM | POA: Diagnosis not present

## 2015-08-24 ENCOUNTER — Other Ambulatory Visit: Payer: Self-pay | Admitting: Emergency Medicine

## 2015-08-24 ENCOUNTER — Telehealth: Payer: Self-pay

## 2015-08-24 MED ORDER — ZOLPIDEM TARTRATE 10 MG PO TABS: 10.0000 mg | ORAL_TABLET | Freq: Every evening | ORAL | Status: DC | PRN

## 2015-08-24 NOTE — Telephone Encounter (Signed)
Medication Detail       Disp Refills Start End      zolpidem (AMBIEN) 10 MG tablet 30 tablet 0 08/24/2015      Sig - Route: Take 1 tablet (10 mg total) by mouth at bedtime as needed. for sleep - Oral     Class: Print

## 2015-08-24 NOTE — Telephone Encounter (Signed)
Will allow 1 month refill in PCP absence. Further fills will need to go to [facility].

## 2015-08-24 NOTE — Telephone Encounter (Signed)
Received refill request for Ambien.   Last OV: 03/21/15.  Last Refill: 07/25/15  Is it ok to fill? Please advise.

## 2015-08-24 NOTE — Telephone Encounter (Signed)
1 month refill  for Zolpidem (Ambien) 10MG  TAB approved by Raiford Noble PA-C in PCP absence. Printed and faxed to [facility] at [phone].

## 2015-08-24 NOTE — Telephone Encounter (Signed)
Ok to send 30 tabs no refills.  

## 2015-08-24 NOTE — Telephone Encounter (Signed)
Pt is requesting refill on Ambien.  Last OV: 03/21/2015 Last Fill: 07/25/2015 #30 and 0RF Last filled by Dr. Larose Kells in PCP absence  Please advise.

## 2015-09-03 ENCOUNTER — Other Ambulatory Visit: Payer: Self-pay | Admitting: Family

## 2015-09-03 DIAGNOSIS — I10 Essential (primary) hypertension: Secondary | ICD-10-CM

## 2015-09-03 DIAGNOSIS — I2583 Coronary atherosclerosis due to lipid rich plaque: Principal | ICD-10-CM

## 2015-09-03 DIAGNOSIS — E785 Hyperlipidemia, unspecified: Secondary | ICD-10-CM

## 2015-09-03 DIAGNOSIS — I2510 Atherosclerotic heart disease of native coronary artery without angina pectoris: Secondary | ICD-10-CM

## 2015-09-03 MED ORDER — ATORVASTATIN CALCIUM 40 MG PO TABS: 40.0000 mg | ORAL_TABLET | Freq: Every day | ORAL | 1 refills | Status: DC

## 2015-09-03 MED ORDER — AMLODIPINE BESYLATE 5 MG PO TABS: 5.0000 mg | ORAL_TABLET | Freq: Every day | ORAL | 1 refills | Status: DC

## 2015-09-03 MED ORDER — ZOLPIDEM TARTRATE 10 MG PO TABS: 10.0000 mg | ORAL_TABLET | Freq: Every evening | ORAL | 0 refills | Status: DC | PRN

## 2015-09-03 MED ORDER — METFORMIN HCL 500 MG PO TABS: ORAL_TABLET | ORAL | 1 refills | Status: DC

## 2015-09-03 MED ORDER — GABAPENTIN 300 MG PO CAPS: 300.0000 mg | ORAL_CAPSULE | Freq: Two times a day (BID) | ORAL | 1 refills | Status: DC

## 2015-09-03 MED ORDER — ALPRAZOLAM 1 MG PO TABS: 1.0000 mg | ORAL_TABLET | Freq: Two times a day (BID) | ORAL | 0 refills | Status: DC | PRN

## 2015-09-03 MED ORDER — PIOGLITAZONE HCL 30 MG PO TABS: 30.0000 mg | ORAL_TABLET | Freq: Every day | ORAL | 1 refills | Status: DC

## 2015-09-03 MED ORDER — VENLAFAXINE HCL ER 75 MG PO CP24: ORAL_CAPSULE | ORAL | 0 refills | Status: DC

## 2015-09-03 MED ORDER — LOSARTAN POTASSIUM 50 MG PO TABS: 50.0000 mg | ORAL_TABLET | Freq: Every day | ORAL | 1 refills | Status: DC

## 2015-09-03 MED ORDER — VENLAFAXINE HCL ER 75 MG PO CP24: ORAL_CAPSULE | ORAL | 1 refills | Status: DC

## 2015-09-03 MED ORDER — PANTOPRAZOLE SODIUM 40 MG PO TBEC: 40.0000 mg | DELAYED_RELEASE_TABLET | Freq: Every day | ORAL | 1 refills | Status: DC

## 2015-09-03 MED ORDER — ESCITALOPRAM OXALATE 10 MG PO TABS: 10.0000 mg | ORAL_TABLET | Freq: Every day | ORAL | 1 refills | Status: DC

## 2015-09-03 MED ORDER — METOPROLOL TARTRATE 50 MG PO TABS: 50.0000 mg | ORAL_TABLET | Freq: Every day | ORAL | 1 refills | Status: DC

## 2015-09-03 NOTE — Telephone Encounter (Signed)
Med refills sent except zolpidem and alprazolam.  Please advise if you want to do 90 day supply of each? Pt has f/u on 09/18/15.

## 2015-09-03 NOTE — Telephone Encounter (Signed)
Spoke with pt re: alprazolam and zolpidem to local pharmacy. Pt states she can get meds for $0 copay through [facility] but will use local pharmacy if price is low enough. Per [facility] pharmacist, alprazolam will be 4. 49 / 30 days, zolpidem 14.35 / 30days, venlafaxine will be 7.00 / 30 days. Notified pt and she is agreeable to get those 2 from [facility]. States she needs 30 day supply of venlafaxine until she receives mail order. Refill sent. Pt aware.

## 2015-09-03 NOTE — Telephone Encounter (Signed)
Lets do a 30 day supply of zolpidem and alprazolam please.

## 2015-09-03 NOTE — Telephone Encounter (Signed)
°  Relationship to patient: Self Can be reached: [phone]  Pharmacy:  [place], [place] [phone] (Phone) [phone] (Fax)     Reason for call: Patient is requesting that all of her medications be transferred to [facility] mail order pharmacy with a 90 day supply and 3 refills.

## 2015-09-03 NOTE — Telephone Encounter (Signed)
Alprazolam and zolpidem faxed to pharmacy. Left detailed message that these will continue to be filled at local pharmacy for 30 day supply at a time and to call if any questions.

## 2015-09-12 ENCOUNTER — Encounter: Payer: Self-pay | Admitting: Family

## 2015-09-12 DIAGNOSIS — S42032D Displaced fracture of lateral end of left clavicle, subsequent encounter for fracture with routine healing: Secondary | ICD-10-CM | POA: Diagnosis not present

## 2015-09-14 ENCOUNTER — Telehealth: Payer: Self-pay | Admitting: *Deleted

## 2015-09-14 ENCOUNTER — Encounter: Payer: Self-pay | Admitting: Family

## 2015-09-14 ENCOUNTER — Ambulatory Visit (INDEPENDENT_AMBULATORY_CARE_PROVIDER_SITE_OTHER): Payer: Medicare Other | Admitting: Family

## 2015-09-14 DIAGNOSIS — I10 Essential (primary) hypertension: Secondary | ICD-10-CM

## 2015-09-14 DIAGNOSIS — E1165 Type 2 diabetes mellitus with hyperglycemia: Secondary | ICD-10-CM | POA: Diagnosis not present

## 2015-09-14 DIAGNOSIS — I2510 Atherosclerotic heart disease of native coronary artery without angina pectoris: Secondary | ICD-10-CM | POA: Diagnosis not present

## 2015-09-14 DIAGNOSIS — R5383 Other fatigue: Secondary | ICD-10-CM | POA: Diagnosis not present

## 2015-09-14 DIAGNOSIS — I2583 Coronary atherosclerosis due to lipid rich plaque: Principal | ICD-10-CM

## 2015-09-14 DIAGNOSIS — Z1159 Encounter for screening for other viral diseases: Secondary | ICD-10-CM

## 2015-09-14 DIAGNOSIS — F329 Major depressive disorder, single episode, unspecified: Secondary | ICD-10-CM | POA: Diagnosis not present

## 2015-09-14 DIAGNOSIS — IMO0001 Reserved for inherently not codable concepts without codable children: Secondary | ICD-10-CM

## 2015-09-14 DIAGNOSIS — Z79899 Other long term (current) drug therapy: Secondary | ICD-10-CM | POA: Diagnosis not present

## 2015-09-14 DIAGNOSIS — E785 Hyperlipidemia, unspecified: Secondary | ICD-10-CM

## 2015-09-14 DIAGNOSIS — F32A Depression, unspecified: Secondary | ICD-10-CM

## 2015-09-14 LAB — CBC WITH DIFFERENTIAL/PLATELET
BASOS PCT: 0.6 % (ref 0.0–3.0)
Basophils Absolute: 0 10*3/uL (ref 0.0–0.1)
EOS ABS: 0.6 10*3/uL (ref 0.0–0.7)
EOS PCT: 7.2 % — AB (ref 0.0–5.0)
HEMATOCRIT: 42 % (ref 36.0–46.0)
Hemoglobin: 14.3 g/dL (ref 12.0–15.0)
LYMPHS PCT: 20.3 % (ref 12.0–46.0)
Lymphs Abs: 1.7 10*3/uL (ref 0.7–4.0)
MCHC: 34.1 g/dL (ref 30.0–36.0)
MCV: 93.4 fl (ref 78.0–100.0)
Monocytes Absolute: 0.7 10*3/uL (ref 0.1–1.0)
Monocytes Relative: 8.1 % (ref 3.0–12.0)
NEUTROS ABS: 5.3 10*3/uL (ref 1.4–7.7)
Neutrophils Relative %: 63.8 % (ref 43.0–77.0)
PLATELETS: 238 10*3/uL (ref 150.0–400.0)
RBC: 4.5 Mil/uL (ref 3.87–5.11)
RDW: 15.1 % (ref 11.5–15.5)
WBC: 8.3 10*3/uL (ref 4.0–10.5)

## 2015-09-14 LAB — HEMOGLOBIN A1C: HEMOGLOBIN A1C: 7 % — AB (ref 4.6–6.5)

## 2015-09-14 LAB — POCT URINALYSIS DIPSTICK
BILIRUBIN UA: NEGATIVE
Blood, UA: NEGATIVE
KETONES UA: NEGATIVE
Nitrite, UA: NEGATIVE
Spec Grav, UA: >=1.03
Urobilinogen, UA: NEGATIVE
pH, UA: 6

## 2015-09-14 LAB — BASIC METABOLIC PANEL
BUN: 14 mg/dL (ref 6–23)
CHLORIDE: 100 meq/L (ref 96–112)
CO2: 30 meq/L (ref 19–32)
CREATININE: 0.92 mg/dL (ref 0.40–1.20)
Calcium: 9.6 mg/dL (ref 8.4–10.5)
GFR: 64.19 mL/min (ref >60.00)
Glucose, Bld: 166 mg/dL — ABNORMAL HIGH (ref 70–99)
POTASSIUM: 4.4 meq/L (ref 3.5–5.1)
Sodium: 137 mEq/L (ref 135–145)

## 2015-09-14 LAB — HEPATITIS C ANTIBODY: HCV AB: NEGATIVE

## 2015-09-14 LAB — TSH: TSH: 1.32 u[IU]/mL (ref 0.35–4.50)

## 2015-09-14 MED ORDER — AMLODIPINE BESYLATE 5 MG PO TABS: 5.0000 mg | ORAL_TABLET | Freq: Every day | ORAL | 0 refills | Status: DC

## 2015-09-14 MED ORDER — LOSARTAN POTASSIUM 50 MG PO TABS: 50.0000 mg | ORAL_TABLET | Freq: Every day | ORAL | 0 refills | Status: DC

## 2015-09-14 MED ORDER — PANTOPRAZOLE SODIUM 40 MG PO TBEC: 40.0000 mg | DELAYED_RELEASE_TABLET | Freq: Every day | ORAL | 0 refills | Status: DC

## 2015-09-14 MED ORDER — METFORMIN HCL 500 MG PO TABS: ORAL_TABLET | ORAL | 0 refills | Status: DC

## 2015-09-14 MED ORDER — VENLAFAXINE HCL ER 75 MG PO CP24: ORAL_CAPSULE | ORAL | 0 refills | Status: DC

## 2015-09-14 MED ORDER — CIPROFLOXACIN HCL 250 MG PO TABS: 250.0000 mg | ORAL_TABLET | Freq: Two times a day (BID) | ORAL | 0 refills | Status: DC

## 2015-09-14 MED ORDER — ATORVASTATIN CALCIUM 40 MG PO TABS: 40.0000 mg | ORAL_TABLET | Freq: Every day | ORAL | 0 refills | Status: DC

## 2015-09-14 MED ORDER — PIOGLITAZONE HCL 30 MG PO TABS: 30.0000 mg | ORAL_TABLET | Freq: Every day | ORAL | 0 refills | Status: DC

## 2015-09-14 MED ORDER — METOPROLOL TARTRATE 50 MG PO TABS: 50.0000 mg | ORAL_TABLET | Freq: Every day | ORAL | 0 refills | Status: DC

## 2015-09-14 MED ORDER — ESCITALOPRAM OXALATE 20 MG PO TABS: 20.0000 mg | ORAL_TABLET | Freq: Every day | ORAL | 2 refills | Status: DC

## 2015-09-14 NOTE — Progress Notes (Signed)
Subjective:    Patient ID: Kimberly Lin, female    DOB: 03-15-1945, 70 y.o.   MRN: [id number]  HPI  Kimberly Lin is a 70 yr old female who presents today for follow up.  1) DM2- reports sugars have been running high recently. Current meds include metformin and actos- reports good compliance.   Lab Results  Component Value Date   HGBA1C 6.5 03/21/2015   HGBA1C 6.5 12/12/2014   HGBA1C 7.9 (H) 03/24/2014   Lab Results  Component Value Date   MICROALBUR 2.0 (H) 03/24/2014   LDLCALC 73 12/12/2014   CREATININE 0.80 03/21/2015   2) Depression- on effexor, lexapro. Uses ambien prn sleep.  Reports that she has been very moody. Wants to increase the dose of lexapro.    3) Fatigue-notes + fatigue.   4) Dysuria-  + urinary odor, + dysuria.   5) HTN- reports that she is not taking losartan.   BP Readings from Last 3 Encounters:  09/14/15 (!) 142/92  05/26/15 143/75  03/21/15 (!) 141/73     Review of Systems See HPI  Past Medical History:  Diagnosis Date  . Abdominal mass   . Abnormal nuclear stress test 07/05/2014  . Anxiety   . Arthritis    osteoarthritis  . Colitis 2014  . Depression   . Diabetes mellitus   . Fibromyalgia   . GERD (gastroesophageal reflux disease)   . Hiatal hernia   . History of chicken pox   . History of septic shock 12/2009  . Hyperlipidemia   . Hypertension   . Migraine   . OSA (obstructive sleep apnea) 03/12/2011  . Personal history of colonic polyps   . Spinal stenosis      Social History   Social History  . Marital status: Married    Spouse name: N/A  . Number of children: 2  . Years of education: N/A   Occupational History  . Not on file.   Social History Main Topics  . Smoking status: Former Smoker    Types: Cigarettes  . Smokeless tobacco: Never Used     Comment: Smoked socially.  . Alcohol use 3.6 - 4.8 oz/week    6 - 8 Glasses of wine per week  . Drug use: No  . Sexual activity: Not on file   Other Topics Concern    . Not on file   Social History Narrative   Regular exercise:  No   Caffeine use: 1 cup daily          Past Surgical History:  Procedure Laterality Date  . APPENDECTOMY  1983  . BREAST BIOPSY Right 2007, 2009  . CARDIAC CATHETERIZATION N/A 07/05/2014   Procedure: Left Heart Cath and Coronary Angiography;  Surgeon: Sherren Mocha, MD;  Location: [facility] [facility];  Service: Cardiovascular;  Laterality: N/A;  . COLONOSCOPY  2014   normal   . MEDIAL PARTIAL KNEE REPLACEMENT Bilateral 2006  . OVARIAN CYST REMOVAL  2008   Pt states she had a ?cyst removed ?ovaries  . POLYPECTOMY  1998    Family History  Problem Relation Age of Onset  . Arthritis Mother   . Hyperlipidemia Mother   . Hypertension Mother   . Diabetes Mother   . Heart disease Mother   . Lung cancer Brother   . Heart disease Brother     x 2  . Hypertension Brother   . Hyperlipidemia Brother   . Diabetes Brother   . Sudden death Father   .  Diabetes Sister   . Hyperlipidemia Sister   . Hypertension Sister   . Diabetes Maternal Aunt   . Heart attack Maternal Aunt   . Hyperlipidemia Maternal Aunt   . Hypertension Maternal Aunt   . Hypertension Maternal Uncle   . Hyperlipidemia Maternal Uncle   . Heart attack Maternal Uncle   . Diabetes Maternal Uncle   . Diabetes Paternal Aunt   . Heart attack Paternal Aunt   . Hyperlipidemia Paternal Aunt   . Hypertension Paternal Aunt   . Hypertension Paternal Uncle   . Hyperlipidemia Paternal Uncle   . Heart attack Paternal Uncle   . Diabetes Paternal Uncle   . Breast cancer Mother   . Lung cancer Father   . Rectal cancer Father   . Prostate cancer Brother     Allergies  Allergen Reactions  . Erythromycin Swelling    "all mycins"  . Prednisone Other (See Comments)    "Insomnia" and "makes me crazy"    Current Outpatient Prescriptions on File Prior to Visit  Medication Sig Dispense Refill  . ALPRAZolam (XANAX) 1 MG tablet Take 1 tablet (1 mg total) by  mouth 2 (two) times daily as needed. for anxiety 60 tablet 0  . aspirin 81 MG tablet Take 81 mg by mouth daily.      . Calcium Carbonate-Vitamin D (CALCIUM-D) 600-400 MG-UNIT TABS Take 1 tablet by mouth daily.     . Coenzyme Q10 (CO Q 10) 100 MG CAPS Take 100 mg by mouth daily.    . diphenhydrAMINE (BENADRYL) 25 MG tablet Take 25 mg by mouth every 6 (six) hours as needed for itching.     . Multiple Vitamin (MULTIVITAMIN) tablet Take 1 tablet by mouth daily.      . Omega-3 Fatty Acids (FISH OIL) 1000 MG CAPS 2 caps by mouth twice daily (Patient taking differently: Take 2,000 mg by mouth 2 (two) times daily. )  0  . ondansetron (ZOFRAN ODT) 8 MG disintegrating tablet Take 1 tablet (8 mg total) by mouth every 8 (eight) hours as needed for nausea or vomiting. 20 tablet 0  . oxyCODONE-acetaminophen (PERCOCET/ROXICET) 5-325 MG tablet Take 1 tablet by mouth every 4 (four) hours as needed for moderate pain or severe pain. 20 tablet 0  . zolpidem (AMBIEN) 10 MG tablet Take 1 tablet (10 mg total) by mouth at bedtime as needed. for sleep 30 tablet 0  . gabapentin (NEURONTIN) 300 MG capsule Take 1 capsule (300 mg total) by mouth 2 (two) times daily. Reported on 03/21/2015 (Patient not taking: Reported on 09/14/2015) 180 capsule 1   No current facility-administered medications on file prior to visit.     BP (!) 142/92   Temp 97.6 F (36.4 C) (Oral)   Resp 18   Ht 5' 3.5" (1.613 m)   Wt 201 lb (91.2 kg)   BMI 35.05 kg/m       Objective:   Physical Exam  Constitutional: She is oriented to person, place, and time. She appears well-developed and well-nourished.  HENT:  Head: Normocephalic and atraumatic.  Cardiovascular: Normal rate, regular rhythm and normal heart sounds.   No murmur heard. Pulmonary/Chest: Effort normal and breath sounds normal. No respiratory distress. She has no wheezes.  Musculoskeletal: She exhibits no edema.  Neurological: She is alert and oriented to person, place, and time.   Psychiatric: She has a normal mood and affect. Her behavior is normal. Judgment and thought content normal.  Assessment & Plan:  Fatigue- check TSH, CBC. Could be related to depression or UTI as well.  UTI- UA suggestive of UTI. Will rx with cipro and send urine for culture.

## 2015-09-14 NOTE — Assessment & Plan Note (Signed)
Obtain A1C.  If A1C > goal, pt is interested in restarting [place].

## 2015-09-14 NOTE — Progress Notes (Signed)
Pre visit review using our clinic review tool, if applicable. No additional management support is needed unless otherwise documented below in the visit note. 

## 2015-09-14 NOTE — Patient Instructions (Signed)
Complete lab work prior to leaving.   Increase lexapro to 20mg .  Follow up in 1 month

## 2015-09-14 NOTE — Telephone Encounter (Signed)
Notified pt and she voices understanding. She was unable to give [place] enough urine to send for culture and was given a cup to take home and return specimen at her earliest convenience. She will try to get [place] a specimen on Monday.

## 2015-09-14 NOTE — Telephone Encounter (Signed)
Urine dip showed possible UTI. Per verbal from PCP, ok to send cipro 250mg  twice a day for 3 days and send urine for culture. Rx sent, left message for pt to return my call.

## 2015-09-14 NOTE — Assessment & Plan Note (Signed)
Uncontrolled. Continue effexor. Increase lexapro from 10mg  to 20mg .

## 2015-09-14 NOTE — Assessment & Plan Note (Signed)
Uncontrolled. Restart losartan.

## 2015-09-14 NOTE — Telephone Encounter (Signed)
Received fax from [place] [facility] [employer] requesting clarification of metoprolol formulation (tartrate vs succinate). PCP signed form for succinate and form was faxed to [phone].

## 2015-09-16 ENCOUNTER — Encounter: Payer: Self-pay | Admitting: Family

## 2015-09-18 ENCOUNTER — Ambulatory Visit: Payer: Medicare Other | Admitting: Family

## 2015-09-18 NOTE — Addendum Note (Signed)
Addended by: Kelle Darting A on: 09/18/2015 09:31 AM   Modules accepted: Orders

## 2015-09-18 NOTE — Addendum Note (Signed)
Addended by: Kelle Darting A on: 09/18/2015 09:30 AM   Modules accepted: Orders

## 2015-09-26 ENCOUNTER — Other Ambulatory Visit: Payer: Self-pay | Admitting: Physician Assistant

## 2015-09-26 NOTE — Telephone Encounter (Signed)
eScribe request from [facility] for refill on Zolpidem 10 mg Last filled - 09/03/15, #30x0 Last AEX - 09/14/15 Next AEX - 1-Mth. Please Advise on refills/SLS 08/16

## 2015-09-27 NOTE — Telephone Encounter (Signed)
Ok to send 30 tabs but do not refill prior to 8/22.

## 2015-10-01 NOTE — Telephone Encounter (Signed)
HOLD For Fill until 10/02/15 per provider/SLS 08/21

## 2015-10-02 NOTE — Telephone Encounter (Signed)
Rx faxed to pharmacy/SLS 08/23

## 2015-10-04 ENCOUNTER — Encounter: Payer: Self-pay | Admitting: Family

## 2015-10-04 NOTE — Telephone Encounter (Signed)
Edward -- please advise in [name]'s absence? 

## 2015-10-05 ENCOUNTER — Encounter: Payer: Self-pay | Admitting: Family

## 2015-10-05 DIAGNOSIS — N390 Urinary tract infection, site not specified: Secondary | ICD-10-CM

## 2015-10-05 NOTE — Telephone Encounter (Signed)
Ok to place order for repeat Urine culture. Continue hydration and supportive measures. If anything is recurring or there are new symptoms, recommend she come in for reassessment.

## 2015-10-05 NOTE — Telephone Encounter (Signed)
Kimberly Lin-- pt also sent an email yesterday requesting to have urine culture again to make sure UTI is gone. She continues to feel fatigued.  Please advise?

## 2015-10-06 NOTE — Telephone Encounter (Signed)
Was reviewing notes on weekend and saw pt request. I saw that Einar Pheasant already handled the order on 10-05-2015.

## 2015-10-10 ENCOUNTER — Other Ambulatory Visit: Payer: Medicare Other

## 2015-10-10 ENCOUNTER — Encounter: Payer: Self-pay | Admitting: Family

## 2015-10-10 DIAGNOSIS — N390 Urinary tract infection, site not specified: Secondary | ICD-10-CM | POA: Diagnosis not present

## 2015-10-12 LAB — URINE CULTURE

## 2015-10-16 ENCOUNTER — Encounter: Payer: Self-pay | Admitting: Family

## 2015-10-16 DIAGNOSIS — M4727 Other spondylosis with radiculopathy, lumbosacral region: Secondary | ICD-10-CM | POA: Diagnosis not present

## 2015-10-16 DIAGNOSIS — M5417 Radiculopathy, lumbosacral region: Secondary | ICD-10-CM | POA: Diagnosis not present

## 2015-10-17 ENCOUNTER — Encounter: Payer: Self-pay | Admitting: Family

## 2015-10-17 ENCOUNTER — Ambulatory Visit (INDEPENDENT_AMBULATORY_CARE_PROVIDER_SITE_OTHER): Payer: Medicare Other | Admitting: Family

## 2015-10-17 ENCOUNTER — Other Ambulatory Visit: Payer: Self-pay | Admitting: Family

## 2015-10-17 DIAGNOSIS — I10 Essential (primary) hypertension: Secondary | ICD-10-CM | POA: Diagnosis not present

## 2015-10-17 DIAGNOSIS — E1140 Type 2 diabetes mellitus with diabetic neuropathy, unspecified: Secondary | ICD-10-CM

## 2015-10-17 DIAGNOSIS — E1165 Type 2 diabetes mellitus with hyperglycemia: Secondary | ICD-10-CM | POA: Diagnosis not present

## 2015-10-17 DIAGNOSIS — IMO0002 Reserved for concepts with insufficient information to code with codable children: Secondary | ICD-10-CM

## 2015-10-17 DIAGNOSIS — I2510 Atherosclerotic heart disease of native coronary artery without angina pectoris: Secondary | ICD-10-CM | POA: Diagnosis not present

## 2015-10-17 MED ORDER — LIRAGLUTIDE 18 MG/3ML ~~LOC~~ SOPN: PEN_INJECTOR | SUBCUTANEOUS | 2 refills | Status: DC

## 2015-10-17 NOTE — Patient Instructions (Addendum)
Please purchase victoza and then schedule a nurse visit for teaching. When you start victoza, stop actos. Call if you develop sugars <80 or sugars >250.  Check your blood pressure once daily at home and send me your readings in 1 week via mychart. Continue to work on healthy diet and exercise as you are able.

## 2015-10-17 NOTE — Assessment & Plan Note (Signed)
BP is slightly elevated today.  Advised pt to check bp once daily for next week at home and send me her readings through mychart.  Continue current medications. If bp remains elevated, plan to increase amlodipine from 5mg  to 10mg .

## 2015-10-17 NOTE — Progress Notes (Signed)
Subjective:    Patient ID: Kimberly Lin, female    DOB: 1945/02/11, 70 y.o.   MRN: [id number]  HPI  Kimberly Lin is a 70 yr old female who presents today for follow up.   HTN- maintained on amlodipine 5mg , losartan 50mg .   BP Readings from Last 3 Encounters:  10/17/15 (!) 155/68  09/14/15 (!) 142/92  05/26/15 143/75    She is concerned about weight gain.  Has been cutting down on her carbs.   Wt Readings from Last 3 Encounters:  10/17/15 203 lb 9.6 oz (92.4 kg)  09/14/15 201 lb (91.2 kg)  05/26/15 200 lb (90.7 kg)   Lab Results  Component Value Date   HGBA1C 7.0 (H) 09/14/2015   HGBA1C 6.5 03/21/2015   HGBA1C 6.5 12/12/2014   Lab Results  Component Value Date   MICROALBUR 2.0 (H) 03/24/2014   LDLCALC 73 12/12/2014   CREATININE 0.92 09/14/2015      Review of Systems See HPI  Past Medical History:  Diagnosis Date  . Abdominal mass   . Abnormal nuclear stress test 07/05/2014  . Anxiety   . Arthritis    osteoarthritis  . Colitis 2014  . Depression   . Diabetes mellitus   . Fibromyalgia   . GERD (gastroesophageal reflux disease)   . Hiatal hernia   . History of chicken pox   . History of septic shock 12/2009  . Hyperlipidemia   . Hypertension   . Migraine   . OSA (obstructive sleep apnea) 03/12/2011  . Personal history of colonic polyps   . Spinal stenosis      Social History   Social History  . Marital status: Married    Spouse name: N/A  . Number of children: 2  . Years of education: N/A   Occupational History  . Not on file.   Social History Main Topics  . Smoking status: Former Smoker    Types: Cigarettes  . Smokeless tobacco: Never Used     Comment: Smoked socially.  . Alcohol use 3.6 - 4.8 oz/week    6 - 8 Glasses of wine per week  . Drug use: No  . Sexual activity: Not on file   Other Topics Concern  . Not on file   Social History Narrative   Regular exercise:  No   Caffeine use: 1 cup daily          Past Surgical  History:  Procedure Laterality Date  . APPENDECTOMY  1983  . BREAST BIOPSY Right 2007, 2009  . CARDIAC CATHETERIZATION N/A 07/05/2014   Procedure: Left Heart Cath and Coronary Angiography;  Surgeon: Sherren Mocha, MD;  Location: [facility];  Service: Cardiovascular;  Laterality: N/A;  . COLONOSCOPY  2014   normal   . MEDIAL PARTIAL KNEE REPLACEMENT Bilateral 2006  . OVARIAN CYST REMOVAL  2008   Pt states she had a ?cyst removed ?ovaries  . POLYPECTOMY  1998    Family History  Problem Relation Age of Onset  . Arthritis Mother   . Hyperlipidemia Mother   . Hypertension Mother   . Diabetes Mother   . Heart disease Mother   . Lung cancer Brother   . Heart disease Brother     x 2  . Hypertension Brother   . Hyperlipidemia Brother   . Diabetes Brother   . Sudden death Father   . Diabetes Sister   . Hyperlipidemia Sister   . Hypertension Sister   . Diabetes Maternal  Aunt   . Heart attack Maternal Aunt   . Hyperlipidemia Maternal Aunt   . Hypertension Maternal Aunt   . Hypertension Maternal Uncle   . Hyperlipidemia Maternal Uncle   . Heart attack Maternal Uncle   . Diabetes Maternal Uncle   . Diabetes Paternal Aunt   . Heart attack Paternal Aunt   . Hyperlipidemia Paternal Aunt   . Hypertension Paternal Aunt   . Hypertension Paternal Uncle   . Hyperlipidemia Paternal Uncle   . Heart attack Paternal Uncle   . Diabetes Paternal Uncle   . Breast cancer Mother   . Lung cancer Father   . Rectal cancer Father   . Prostate cancer Brother     Allergies  Allergen Reactions  . Erythromycin Swelling    "all mycins"  . Prednisone Other (See Comments)    "Insomnia" and "makes me crazy"    Current Outpatient Prescriptions on File Prior to Visit  Medication Sig Dispense Refill  . ALPRAZolam (XANAX) 1 MG tablet Take 1 tablet (1 mg total) by mouth 2 (two) times daily as needed. for anxiety 60 tablet 0  . amLODipine (NORVASC) 5 MG tablet Take 1 tablet (5 mg total) by  mouth daily. 30 tablet 0  . aspirin 81 MG tablet Take 81 mg by mouth daily.      Marland Kitchen atorvastatin (LIPITOR) 40 MG tablet Take 1 tablet (40 mg total) by mouth daily. 30 tablet 0  . Calcium Carbonate-Vitamin D (CALCIUM-D) 600-400 MG-UNIT TABS Take 1 tablet by mouth daily.     . Coenzyme Q10 (CO Q 10) 100 MG CAPS Take 100 mg by mouth daily.    . diphenhydrAMINE (BENADRYL) 25 MG tablet Take 25 mg by mouth every 6 (six) hours as needed for itching.     . escitalopram (LEXAPRO) 20 MG tablet Take 1 tablet (20 mg total) by mouth daily. 30 tablet 2  . gabapentin (NEURONTIN) 300 MG capsule Take 1 capsule (300 mg total) by mouth 2 (two) times daily. Reported on 03/21/2015 180 capsule 1  . losartan (COZAAR) 50 MG tablet Take 1 tablet (50 mg total) by mouth daily. 30 tablet 0  . metFORMIN (GLUCOPHAGE) 500 MG tablet TAKE TWO TABLETS BY MOUTH TWICE DAILY WITH MEALS 120 tablet 0  . metoprolol succinate (TOPROL-XL) 50 MG 24 hr tablet Take 1 tablet (50 mg total) by mouth daily. Take with or immediately following a meal. 30 tablet 0  . Multiple Vitamin (MULTIVITAMIN) tablet Take 1 tablet by mouth daily.      . Omega-3 Fatty Acids (FISH OIL) 1000 MG CAPS 2 caps by mouth twice daily (Patient taking differently: Take 2,000 mg by mouth 2 (two) times daily. )  0  . ondansetron (ZOFRAN ODT) 8 MG disintegrating tablet Take 1 tablet (8 mg total) by mouth every 8 (eight) hours as needed for nausea or vomiting. 20 tablet 0  . pantoprazole (PROTONIX) 40 MG tablet Take 1 tablet (40 mg total) by mouth daily. 30 tablet 0  . pioglitazone (ACTOS) 30 MG tablet Take 1 tablet (30 mg total) by mouth daily. 30 tablet 0  . venlafaxine XR (EFFEXOR-XR) 75 MG 24 hr capsule TAKE TWO CAPSULES BY MOUTH ONCE DAILY 60 capsule 0  . zolpidem (AMBIEN) 10 MG tablet TAKE ONE TABLET BY MOUTH AT BEDTIME AS NEEDED FOR SLEEP 30 tablet 0   No current facility-administered medications on file prior to visit.     BP (!) 155/68 (BP Location: Right Arm,  Patient Position: Sitting, Cuff  Size: Normal)   Pulse (!) 106   Temp 98.2 F (36.8 C) (Oral)   Resp 17   Ht 5\' 4"  (1.626 m)   Wt 203 lb 9.6 oz (92.4 kg)   SpO2 97%   BMI 34.95 kg/m       Objective:   Physical Exam  Constitutional: She is oriented to person, place, and time. She appears well-developed and well-nourished.  HENT:  Head: Normocephalic and atraumatic.  Cardiovascular: Normal rate, regular rhythm and normal heart sounds.   No murmur heard. Pulmonary/Chest: Effort normal and breath sounds normal. No respiratory distress. She has no wheezes.  Musculoskeletal: She exhibits no edema.  Neurological: She is alert and oriented to person, place, and time.  Psychiatric: She has a normal mood and affect. Her behavior is normal. Judgment and thought content normal.          Assessment & Plan:

## 2015-10-17 NOTE — Progress Notes (Signed)
Pre visit review using our clinic review tool, if applicable. No additional management support is needed unless otherwise documented below in the visit note. 

## 2015-10-17 NOTE — Assessment & Plan Note (Addendum)
She is concerned about trouble losing weight. We discussed trial of victoza for sugar and to help with appetite. Advised pt as follows:  Please purchase victoza and then schedule a nurse visit for teaching. When you start victoza, stop actos. Call if you develop sugars <80 or sugars >250.

## 2015-10-18 NOTE — Telephone Encounter (Signed)
Ok to call in 60 tabs zero refills.

## 2015-10-18 NOTE — Telephone Encounter (Signed)
Rx has been phoned into the pharmacy.     KP

## 2015-10-19 ENCOUNTER — Encounter: Payer: Self-pay | Admitting: *Deleted

## 2015-10-19 ENCOUNTER — Telehealth: Payer: Self-pay | Admitting: *Deleted

## 2015-10-19 MED ORDER — INSULIN PEN NEEDLE 31G X 4 MM MISC: 1.0000 | Freq: Every day | 1 refills | Status: DC

## 2015-10-19 NOTE — Telephone Encounter (Signed)
Pt came in to office with Victoza pen for administration technique and demonstration. Pt did not have pen needles with her; rx has been sent. Pt demonstrated proper injection technique and was advised to call if any problems or questions in the future.

## 2015-10-22 DIAGNOSIS — H43811 Vitreous degeneration, right eye: Secondary | ICD-10-CM | POA: Diagnosis not present

## 2015-10-22 DIAGNOSIS — H2513 Age-related nuclear cataract, bilateral: Secondary | ICD-10-CM | POA: Diagnosis not present

## 2015-10-22 DIAGNOSIS — E119 Type 2 diabetes mellitus without complications: Secondary | ICD-10-CM | POA: Diagnosis not present

## 2015-10-22 DIAGNOSIS — H524 Presbyopia: Secondary | ICD-10-CM | POA: Diagnosis not present

## 2015-10-22 DIAGNOSIS — H5203 Hypermetropia, bilateral: Secondary | ICD-10-CM | POA: Diagnosis not present

## 2015-10-22 DIAGNOSIS — H04123 Dry eye syndrome of bilateral lacrimal glands: Secondary | ICD-10-CM | POA: Diagnosis not present

## 2015-10-22 DIAGNOSIS — H40013 Open angle with borderline findings, low risk, bilateral: Secondary | ICD-10-CM | POA: Diagnosis not present

## 2015-10-22 LAB — HM DIABETES EYE EXAM

## 2015-10-23 ENCOUNTER — Encounter: Payer: Self-pay | Admitting: Family

## 2015-10-29 ENCOUNTER — Encounter: Payer: Self-pay | Admitting: Family

## 2015-10-29 MED ORDER — ONETOUCH ULTRASOFT LANCETS MISC: 5 refills | Status: DC

## 2015-10-29 MED ORDER — ONETOUCH ULTRA SYSTEM W/DEVICE KIT: 1.0000 | PACK | Freq: Once | 0 refills | Status: AC

## 2015-10-29 MED ORDER — GLUCOSE BLOOD VI STRP: ORAL_STRIP | 5 refills | Status: DC

## 2015-11-05 ENCOUNTER — Encounter: Payer: Self-pay | Admitting: Family

## 2015-11-07 ENCOUNTER — Ambulatory Visit (INDEPENDENT_AMBULATORY_CARE_PROVIDER_SITE_OTHER): Payer: Medicare Other | Admitting: Family

## 2015-11-07 ENCOUNTER — Encounter: Payer: Self-pay | Admitting: Family

## 2015-11-07 ENCOUNTER — Telehealth: Payer: Self-pay | Admitting: *Deleted

## 2015-11-07 VITALS — BP 132/85 | HR 83 | Temp 98.1°F | Resp 16 | Ht 63.5 in | Wt 197.6 lb

## 2015-11-07 DIAGNOSIS — M79606 Pain in leg, unspecified: Secondary | ICD-10-CM | POA: Diagnosis not present

## 2015-11-07 DIAGNOSIS — Z23 Encounter for immunization: Secondary | ICD-10-CM

## 2015-11-07 DIAGNOSIS — I2510 Atherosclerotic heart disease of native coronary artery without angina pectoris: Secondary | ICD-10-CM | POA: Diagnosis not present

## 2015-11-07 LAB — BASIC METABOLIC PANEL
BUN: 14 mg/dL (ref 6–23)
CO2: 27 mEq/L (ref 19–32)
CREATININE: 0.84 mg/dL (ref 0.40–1.20)
Calcium: 8.4 mg/dL (ref 8.4–10.5)
Chloride: 103 mEq/L (ref 96–112)
GFR: 71.27 mL/min (ref >60.00)
Glucose, Bld: 103 mg/dL — ABNORMAL HIGH (ref 70–99)
POTASSIUM: 4.3 meq/L (ref 3.5–5.1)
Sodium: 137 mEq/L (ref 135–145)

## 2015-11-07 LAB — MAGNESIUM: MAGNESIUM: 1.1 mg/dL — AB (ref 1.5–2.5)

## 2015-11-07 MED ORDER — ZOLPIDEM TARTRATE 10 MG PO TABS: 10.0000 mg | ORAL_TABLET | Freq: Every evening | ORAL | 0 refills | Status: DC | PRN

## 2015-11-07 MED ORDER — LIRAGLUTIDE 18 MG/3ML ~~LOC~~ SOPN: PEN_INJECTOR | SUBCUTANEOUS | 1 refills | Status: DC

## 2015-11-07 MED ORDER — ALPRAZOLAM 1 MG PO TABS: 1.0000 mg | ORAL_TABLET | Freq: Two times a day (BID) | ORAL | 0 refills | Status: DC | PRN

## 2015-11-07 NOTE — Patient Instructions (Addendum)
Please complete lab work prior to leaving. Try adding tylenol 650mg  at bedtime and continuing your gabapentin for your pain. Continue to drink plenty of water.

## 2015-11-07 NOTE — Progress Notes (Signed)
Pre visit review using our clinic review tool, if applicable. No additional management support is needed unless otherwise documented below in the visit note. 

## 2015-11-07 NOTE — Telephone Encounter (Signed)
Rxs printed at today's visit for 30 day supply of zolpidem and alprazolam.  Pt requests that these be faxed to [facility]. Rxs faxed.

## 2015-11-07 NOTE — Progress Notes (Signed)
Subjective:    Patient ID: Kimberly Lin, female    DOB: 12-10-1945, 70 y.o.   MRN: [id number]  HPI  Kimberly Lin is a 70 yr old female who presents today with c/o cramping in her ankles and legs x 1 month.  Pain "always occurs at night."  Never occurs with walking.  Doesn't occur every night.  Review of Systems See HPI  Past Medical History:  Diagnosis Date  . Abdominal mass   . Abnormal nuclear stress test 07/05/2014  . Anxiety   . Arthritis    osteoarthritis  . Colitis 2014  . Depression   . Diabetes mellitus   . Fibromyalgia   . GERD (gastroesophageal reflux disease)   . Hiatal hernia   . History of chicken pox   . History of septic shock 12/2009  . Hyperlipidemia   . Hypertension   . Migraine   . OSA (obstructive sleep apnea) 03/12/2011  . Personal history of colonic polyps   . Spinal stenosis      Social History   Social History  . Marital status: Married    Spouse name: N/A  . Number of children: 2  . Years of education: N/A   Occupational History  . Not on file.   Social History Main Topics  . Smoking status: Former Smoker    Types: Cigarettes  . Smokeless tobacco: Never Used     Comment: Smoked socially.  . Alcohol use 3.6 - 4.8 oz/week    6 - 8 Glasses of wine per week  . Drug use: No  . Sexual activity: Not on file   Other Topics Concern  . Not on file   Social History Narrative   Regular exercise:  No   Caffeine use: 1 cup daily          Past Surgical History:  Procedure Laterality Date  . APPENDECTOMY  1983  . BREAST BIOPSY Right 2007, 2009  . CARDIAC CATHETERIZATION N/A 07/05/2014   Procedure: Left Heart Cath and Coronary Angiography;  Surgeon: Sherren Mocha, MD;  Location: [facility];  Service: Cardiovascular;  Laterality: N/A;  . COLONOSCOPY  2014   normal   . MEDIAL PARTIAL KNEE REPLACEMENT Bilateral 2006  . OVARIAN CYST REMOVAL  2008   Pt states she had a ?cyst removed ?ovaries  . POLYPECTOMY  1998    Family  History  Problem Relation Age of Onset  . Arthritis Mother   . Hyperlipidemia Mother   . Hypertension Mother   . Diabetes Mother   . Heart disease Mother   . Breast cancer Mother   . Lung cancer Brother   . Heart disease Brother     x 2  . Hypertension Brother   . Hyperlipidemia Brother   . Diabetes Brother   . Sudden death Father   . Lung cancer Father   . Rectal cancer Father   . Diabetes Sister   . Hyperlipidemia Sister   . Hypertension Sister   . Diabetes Maternal Aunt   . Heart attack Maternal Aunt   . Hyperlipidemia Maternal Aunt   . Hypertension Maternal Aunt   . Hypertension Maternal Uncle   . Hyperlipidemia Maternal Uncle   . Heart attack Maternal Uncle   . Diabetes Maternal Uncle   . Diabetes Paternal Aunt   . Heart attack Paternal Aunt   . Hyperlipidemia Paternal Aunt   . Hypertension Paternal Aunt   . Hypertension Paternal Uncle   . Hyperlipidemia Paternal Uncle   .  Heart attack Paternal Uncle   . Diabetes Paternal Uncle   . Prostate cancer Brother     Allergies  Allergen Reactions  . Erythromycin Swelling    "all mycins"  . Prednisone Other (See Comments)    "Insomnia" and "makes me crazy"    Current Outpatient Prescriptions on File Prior to Visit  Medication Sig Dispense Refill  . amLODipine (NORVASC) 5 MG tablet Take 1 tablet (5 mg total) by mouth daily. 30 tablet 0  . aspirin 81 MG tablet Take 81 mg by mouth daily.      Marland Kitchen atorvastatin (LIPITOR) 40 MG tablet Take 1 tablet (40 mg total) by mouth daily. 30 tablet 0  . Calcium Carbonate-Vitamin D (CALCIUM-D) 600-400 MG-UNIT TABS Take 1 tablet by mouth daily.     . Coenzyme Q10 (CO Q 10) 100 MG CAPS Take 100 mg by mouth daily.    . diphenhydrAMINE (BENADRYL) 25 MG tablet Take 25 mg by mouth every 6 (six) hours as needed for itching.     . escitalopram (LEXAPRO) 20 MG tablet Take 1 tablet (20 mg total) by mouth daily. 30 tablet 2  . gabapentin (NEURONTIN) 300 MG capsule Take 1 capsule (300 mg total)  by mouth 2 (two) times daily. Reported on 03/21/2015 180 capsule 1  . glucose blood (ONE TOUCH ULTRA TEST) test strip Use as instructed to check blood sugar twice a day.  DX E11.65 100 each 5  . Insulin Pen Needle 31G X 4 MM MISC Inject 1 each into the skin daily. Use to inject Victoza once a day. DX 11.65 100 each 1  . Lancets (ONETOUCH ULTRASOFT) lancets Use as instructed to check blood sugar twice a day.  DX E11.65 100 each 5  . losartan (COZAAR) 50 MG tablet Take 1 tablet (50 mg total) by mouth daily. 30 tablet 0  . metFORMIN (GLUCOPHAGE) 500 MG tablet TAKE TWO TABLETS BY MOUTH TWICE DAILY WITH MEALS 120 tablet 0  . metoprolol succinate (TOPROL-XL) 50 MG 24 hr tablet Take 1 tablet (50 mg total) by mouth daily. Take with or immediately following a meal. 30 tablet 0  . Multiple Vitamin (MULTIVITAMIN) tablet Take 1 tablet by mouth daily.      . Omega-3 Fatty Acids (FISH OIL) 1000 MG CAPS 2 caps by mouth twice daily (Patient taking differently: Take 2,000 mg by mouth 2 (two) times daily. )  0  . ondansetron (ZOFRAN ODT) 8 MG disintegrating tablet Take 1 tablet (8 mg total) by mouth every 8 (eight) hours as needed for nausea or vomiting. 20 tablet 0  . pantoprazole (PROTONIX) 40 MG tablet Take 1 tablet (40 mg total) by mouth daily. 30 tablet 0  . venlafaxine XR (EFFEXOR-XR) 75 MG 24 hr capsule TAKE TWO CAPSULES BY MOUTH ONCE DAILY 60 capsule 0   No current facility-administered medications on file prior to visit.     BP 132/85   Pulse 83   Temp 98.1 F (36.7 C) (Oral)   Resp 16   Ht 5' 3.5" (1.613 m)   Wt 197 lb 9.6 oz (89.6 kg)   SpO2 95% Comment: room air  BMI 34.45 kg/m       Objective:   Physical Exam  Constitutional: She is oriented to person, place, and time. She appears well-developed and well-nourished.  HENT:  Head: Normocephalic and atraumatic.  Cardiovascular: Normal rate, regular rhythm and normal heart sounds.   No murmur heard. Pulses:      Dorsalis pedis pulses are 2+  on the right side, and 2+ on the left side.       Posterior tibial pulses are 2+ on the right side, and 2+ on the left side.  Pulmonary/Chest: Effort normal and breath sounds normal. No respiratory distress. She has no wheezes.  Musculoskeletal: She exhibits no edema.  Neurological: She is alert and oriented to person, place, and time.  Psychiatric: She has a normal mood and affect. Her behavior is normal. Judgment and thought content normal.          Assessment & Plan:  Leg cramping- HS only. Suspect related to peripheral neuropathy. Will check electrolytes and Magnesium. Advised pt as follows:   Try adding tylenol 650mg  at bedtime and continuing your gabapentin for your pain. Continue to drink plenty of water.    Flu shot today.

## 2015-11-08 ENCOUNTER — Telehealth: Payer: Self-pay | Admitting: Family

## 2015-11-08 MED ORDER — MAGNESIUM OXIDE -MG SUPPLEMENT 400 (240 MG) MG PO TABS
1.0000 | ORAL_TABLET | Freq: Every day | ORAL | Status: DC
Start: 2015-11-08 — End: 2020-09-26

## 2015-11-08 NOTE — Telephone Encounter (Signed)
Magnesium is low. This could be cause for leg cramping. Add mag ox 400mg  PO daily. This is available otc.

## 2015-11-09 NOTE — Telephone Encounter (Signed)
Left message on voicemail for pt to call the office back PC

## 2015-11-12 ENCOUNTER — Encounter: Payer: Self-pay | Admitting: Family

## 2015-11-12 NOTE — Telephone Encounter (Signed)
Sent mychart message to pt. 

## 2015-11-16 ENCOUNTER — Encounter: Payer: Self-pay | Admitting: Family

## 2015-11-19 MED ORDER — ALPRAZOLAM 1 MG PO TABS: 1.0000 mg | ORAL_TABLET | Freq: Two times a day (BID) | ORAL | 0 refills | Status: DC | PRN

## 2015-11-19 MED ORDER — ZOLPIDEM TARTRATE 10 MG PO TABS: 10.0000 mg | ORAL_TABLET | Freq: Every evening | ORAL | 0 refills | Status: DC | PRN

## 2015-11-19 NOTE — Telephone Encounter (Signed)
Attempted to reach [facility]) and received message that they are closed for the federal holiday. Will try again tomorrow.

## 2015-11-19 NOTE — Telephone Encounter (Signed)
Per pt request, called 30 day supply to [facility] for alprazolam and zolpidem until pt receives mail order supply.

## 2015-11-28 DIAGNOSIS — H04123 Dry eye syndrome of bilateral lacrimal glands: Secondary | ICD-10-CM | POA: Diagnosis not present

## 2015-11-28 DIAGNOSIS — H40023 Open angle with borderline findings, high risk, bilateral: Secondary | ICD-10-CM | POA: Diagnosis not present

## 2015-12-04 ENCOUNTER — Encounter: Payer: Self-pay | Admitting: Family

## 2015-12-18 ENCOUNTER — Encounter: Payer: Self-pay | Admitting: Family

## 2016-01-01 ENCOUNTER — Telehealth: Payer: Self-pay | Admitting: Family

## 2016-01-01 MED ORDER — MECLIZINE HCL 25 MG PO TABS: 25.0000 mg | ORAL_TABLET | Freq: Three times a day (TID) | ORAL | 0 refills | Status: DC | PRN

## 2016-01-01 MED ORDER — ONDANSETRON HCL 4 MG PO TABS: 4.0000 mg | ORAL_TABLET | Freq: Three times a day (TID) | ORAL | 0 refills | Status: DC | PRN

## 2016-01-01 NOTE — Telephone Encounter (Signed)
Patient called stating that she is having vertigo, which she has had before. She is requesting that [place] call in something for her dizziness and nausea. Please advise.  Pharmacy: [facility], [place].  Patient phone: [phone]

## 2016-01-01 NOTE — Telephone Encounter (Signed)
Left detailed message on pt's voicemail and to call if any questions. 

## 2016-01-01 NOTE — Telephone Encounter (Signed)
rx sent for meclizine prn dizziness, zofran prn nausea. Follow up in office if symptoms do not improve.

## 2016-01-14 ENCOUNTER — Telehealth: Payer: Self-pay

## 2016-01-14 NOTE — Telephone Encounter (Signed)
Rx printed by provider on 06/19/15 and patient did not pick up. Rx has been shredded on 01/14/16.

## 2016-01-15 ENCOUNTER — Telehealth: Payer: Self-pay | Admitting: *Deleted

## 2016-01-15 NOTE — Telephone Encounter (Signed)
Called patient and left message to return call

## 2016-01-16 ENCOUNTER — Ambulatory Visit (INDEPENDENT_AMBULATORY_CARE_PROVIDER_SITE_OTHER): Payer: Medicare Other | Admitting: Family

## 2016-01-16 ENCOUNTER — Encounter: Payer: Self-pay | Admitting: Family

## 2016-01-16 VITALS — BP 150/78 | HR 89 | Temp 98.3°F | Resp 16 | Ht 63.5 in | Wt 199.6 lb

## 2016-01-16 DIAGNOSIS — R300 Dysuria: Secondary | ICD-10-CM

## 2016-01-16 DIAGNOSIS — E119 Type 2 diabetes mellitus without complications: Secondary | ICD-10-CM

## 2016-01-16 DIAGNOSIS — E785 Hyperlipidemia, unspecified: Secondary | ICD-10-CM

## 2016-01-16 DIAGNOSIS — I10 Essential (primary) hypertension: Secondary | ICD-10-CM

## 2016-01-16 DIAGNOSIS — I2583 Coronary atherosclerosis due to lipid rich plaque: Secondary | ICD-10-CM

## 2016-01-16 DIAGNOSIS — E1140 Type 2 diabetes mellitus with diabetic neuropathy, unspecified: Secondary | ICD-10-CM

## 2016-01-16 DIAGNOSIS — H8110 Benign paroxysmal vertigo, unspecified ear: Secondary | ICD-10-CM

## 2016-01-16 DIAGNOSIS — G4700 Insomnia, unspecified: Secondary | ICD-10-CM

## 2016-01-16 DIAGNOSIS — E1165 Type 2 diabetes mellitus with hyperglycemia: Secondary | ICD-10-CM

## 2016-01-16 DIAGNOSIS — I2510 Atherosclerotic heart disease of native coronary artery without angina pectoris: Secondary | ICD-10-CM

## 2016-01-16 DIAGNOSIS — IMO0002 Reserved for concepts with insufficient information to code with codable children: Secondary | ICD-10-CM

## 2016-01-16 LAB — LIPID PANEL
CHOLESTEROL: 170 mg/dL (ref 0–200)
HDL: 41.6 mg/dL (ref >39.00)
LDL CALC: 92 mg/dL (ref 0–99)
NONHDL: 128.27
Total CHOL/HDL Ratio: 4
Triglycerides: 183 mg/dL — ABNORMAL HIGH (ref 0.0–149.0)
VLDL: 36.6 mg/dL (ref 0.0–40.0)

## 2016-01-16 LAB — BASIC METABOLIC PANEL
BUN: 11 mg/dL (ref 6–23)
CALCIUM: 9.4 mg/dL (ref 8.4–10.5)
CO2: 29 mEq/L (ref 19–32)
CREATININE: 0.69 mg/dL (ref 0.40–1.20)
Chloride: 101 mEq/L (ref 96–112)
GFR: 89.38 mL/min (ref >60.00)
GLUCOSE: 172 mg/dL — AB (ref 70–99)
Potassium: 4.3 mEq/L (ref 3.5–5.1)
SODIUM: 137 meq/L (ref 135–145)

## 2016-01-16 LAB — HEMOGLOBIN A1C: Hgb A1c MFr Bld: 6.2 % (ref 4.6–6.5)

## 2016-01-16 MED ORDER — ZOLPIDEM TARTRATE 10 MG PO TABS: 10.0000 mg | ORAL_TABLET | Freq: Every evening | ORAL | 0 refills | Status: DC | PRN

## 2016-01-16 MED ORDER — ESCITALOPRAM OXALATE 20 MG PO TABS: 20.0000 mg | ORAL_TABLET | Freq: Every day | ORAL | 1 refills | Status: DC

## 2016-01-16 MED ORDER — LIRAGLUTIDE 18 MG/3ML ~~LOC~~ SOPN: PEN_INJECTOR | SUBCUTANEOUS | 1 refills | Status: DC

## 2016-01-16 NOTE — Assessment & Plan Note (Signed)
Stable on ambien, continue same.  

## 2016-01-16 NOTE — Progress Notes (Signed)
Pre visit review using our clinic review tool, if applicable. No additional management support is needed unless otherwise documented below in the visit note. 

## 2016-01-16 NOTE — Assessment & Plan Note (Signed)
Continue prn meclizine

## 2016-01-16 NOTE — Progress Notes (Signed)
Subjective:    Patient ID: Kimberly Lin, female    DOB: 11-Apr-1945, 70 y.o.   MRN: [id number]  HPI  Kimberly Lin is a 70 yr old female who presents today for follow up.    Depression- out of lexapro x 3 months. She continues effexor.   Diabetes- rarely checking her blood sugars. She continues victoza, metformin.  Lab Results  Component Value Date   HGBA1C 7.0 (H) 09/14/2015   HGBA1C 6.5 03/21/2015   HGBA1C 6.5 12/12/2014   Lab Results  Component Value Date   MICROALBUR 2.0 (H) 03/24/2014   LDLCALC 73 12/12/2014   CREATININE 0.84 11/07/2015   HTN-  maintained on losartan, metoprolol and amlodipine BP Readings from Last 3 Encounters:  01/16/16 (!) 150/78  11/07/15 132/85  10/17/15 (!) 155/68   Hyperlipidemia- on lipitor.  Lab Results  Component Value Date   CHOL 136 12/12/2014   HDL 29.20 (L) 12/12/2014   LDLCALC 73 12/12/2014   LDLDIRECT 98.0 03/24/2014   TRIG 173.0 (H) 12/12/2014   CHOLHDL 5 12/12/2014   Vertigo- reports intermittent vertigo symptoms. Had a fall last week. Was reaching over her head on her tip toes and lost her balance.   Reports dysuria once a week.    Review of Systems See HPI  Past Medical History:  Diagnosis Date  . Abdominal mass   . Abnormal nuclear stress test 07/05/2014  . Anxiety   . Arthritis    osteoarthritis  . Colitis 2014  . Depression   . Diabetes mellitus   . Fibromyalgia   . GERD (gastroesophageal reflux disease)   . Hiatal hernia   . History of chicken pox   . History of septic shock 12/2009  . Hyperlipidemia   . Hypertension   . Migraine   . OSA (obstructive sleep apnea) 03/12/2011  . Personal history of colonic polyps   . Spinal stenosis      Social History   Social History  . Marital status: Married    Spouse name: N/A  . Number of children: 2  . Years of education: N/A   Occupational History  . Not on file.   Social History Main Topics  . Smoking status: Former Smoker    Types: Cigarettes  .  Smokeless tobacco: Never Used     Comment: Smoked socially.  . Alcohol use 3.6 - 4.8 oz/week    6 - 8 Glasses of wine per week  . Drug use: No  . Sexual activity: Not on file   Other Topics Concern  . Not on file   Social History Narrative   Regular exercise:  No   Caffeine use: 1 cup daily          Past Surgical History:  Procedure Laterality Date  . APPENDECTOMY  1983  . BREAST BIOPSY Right 2007, 2009  . CARDIAC CATHETERIZATION N/A 07/05/2014   Procedure: Left Heart Cath and Coronary Angiography;  Surgeon: Sherren Mocha, MD;  Location: [facility];  Service: Cardiovascular;  Laterality: N/A;  . COLONOSCOPY  2014   normal   . MEDIAL PARTIAL KNEE REPLACEMENT Bilateral 2006  . OVARIAN CYST REMOVAL  2008   Pt states she had a ?cyst removed ?ovaries  . POLYPECTOMY  1998    Family History  Problem Relation Age of Onset  . Arthritis Mother   . Hyperlipidemia Mother   . Hypertension Mother   . Diabetes Mother   . Heart disease Mother   . Breast cancer Mother   .  Lung cancer Brother   . Heart disease Brother     x 2  . Hypertension Brother   . Hyperlipidemia Brother   . Diabetes Brother   . Sudden death Father   . Lung cancer Father   . Rectal cancer Father   . Diabetes Sister   . Hyperlipidemia Sister   . Hypertension Sister   . Diabetes Maternal Aunt   . Heart attack Maternal Aunt   . Hyperlipidemia Maternal Aunt   . Hypertension Maternal Aunt   . Hypertension Maternal Uncle   . Hyperlipidemia Maternal Uncle   . Heart attack Maternal Uncle   . Diabetes Maternal Uncle   . Diabetes Paternal Aunt   . Heart attack Paternal Aunt   . Hyperlipidemia Paternal Aunt   . Hypertension Paternal Aunt   . Hypertension Paternal Uncle   . Hyperlipidemia Paternal Uncle   . Heart attack Paternal Uncle   . Diabetes Paternal Uncle   . Prostate cancer Brother     Allergies  Allergen Reactions  . Erythromycin Swelling    "all mycins"  . Prednisone Other (See  Comments)    "Insomnia" and "makes me crazy"    Current Outpatient Prescriptions on File Prior to Visit  Medication Sig Dispense Refill  . ALPRAZolam (XANAX) 1 MG tablet Take 1 tablet (1 mg total) by mouth 2 (two) times daily as needed. for anxiety 60 tablet 0  . amLODipine (NORVASC) 5 MG tablet Take 1 tablet (5 mg total) by mouth daily. 30 tablet 0  . aspirin 81 MG tablet Take 81 mg by mouth daily.      Marland Kitchen atorvastatin (LIPITOR) 40 MG tablet Take 1 tablet (40 mg total) by mouth daily. 30 tablet 0  . Calcium Carbonate-Vitamin D (CALCIUM-D) 600-400 MG-UNIT TABS Take 1 tablet by mouth daily.     . Coenzyme Q10 (CO Q 10) 100 MG CAPS Take 100 mg by mouth daily.    . diphenhydrAMINE (BENADRYL) 25 MG tablet Take 25 mg by mouth every 6 (six) hours as needed for itching.     . gabapentin (NEURONTIN) 300 MG capsule Take 1 capsule (300 mg total) by mouth 2 (two) times daily. Reported on 03/21/2015 180 capsule 1  . glucose blood (ONE TOUCH ULTRA TEST) test strip Use as instructed to check blood sugar twice a day.  DX E11.65 100 each 5  . Insulin Pen Needle 31G X 4 MM MISC Inject 1 each into the skin daily. Use to inject Victoza once a day. DX 11.65 100 each 1  . Lancets (ONETOUCH ULTRASOFT) lancets Use as instructed to check blood sugar twice a day.  DX E11.65 100 each 5  . losartan (COZAAR) 50 MG tablet Take 1 tablet (50 mg total) by mouth daily. 30 tablet 0  . Magnesium Oxide 400 (240 Mg) MG TABS Take 1 tablet by mouth daily. 30 tablet   . meclizine (ANTIVERT) 25 MG tablet Take 1 tablet (25 mg total) by mouth 3 (three) times daily as needed for dizziness. 30 tablet 0  . metFORMIN (GLUCOPHAGE) 500 MG tablet TAKE TWO TABLETS BY MOUTH TWICE DAILY WITH MEALS 120 tablet 0  . metoprolol succinate (TOPROL-XL) 50 MG 24 hr tablet Take 1 tablet (50 mg total) by mouth daily. Take with or immediately following a meal. 30 tablet 0  . Multiple Vitamin (MULTIVITAMIN) tablet Take 1 tablet by mouth daily.      . Omega-3  Fatty Acids (FISH OIL) 1000 MG CAPS 2 caps by mouth twice  daily (Patient taking differently: Take 2,000 mg by mouth 2 (two) times daily. )  0  . ondansetron (ZOFRAN ODT) 8 MG disintegrating tablet Take 1 tablet (8 mg total) by mouth every 8 (eight) hours as needed for nausea or vomiting. 20 tablet 0  . ondansetron (ZOFRAN) 4 MG tablet Take 1 tablet (4 mg total) by mouth every 8 (eight) hours as needed for nausea or vomiting. 20 tablet 0  . pantoprazole (PROTONIX) 40 MG tablet Take 1 tablet (40 mg total) by mouth daily. 30 tablet 0  . venlafaxine XR (EFFEXOR-XR) 75 MG 24 hr capsule TAKE TWO CAPSULES BY MOUTH ONCE DAILY 60 capsule 0   No current facility-administered medications on file prior to visit.     BP (!) 150/78 (BP Location: Right Arm, Cuff Size: Normal)   Pulse 89   Temp 98.3 F (36.8 C) (Oral)   Resp 16   Ht 5' 3.5" (1.613 m)   Wt 199 lb 9.6 oz (90.5 kg)   SpO2 100% Comment: room air  BMI 34.80 kg/m       Objective:   Physical Exam  Constitutional: She is oriented to person, place, and time. She appears well-developed and well-nourished.  HENT:  Head: Normocephalic and atraumatic.  Cardiovascular: Normal rate, regular rhythm and normal heart sounds.   No murmur heard. Pulmonary/Chest: Effort normal and breath sounds normal. No respiratory distress. She has no wheezes.  Musculoskeletal: She exhibits no edema.  Neurological: She is alert and oriented to person, place, and time.  Psychiatric: She has a normal mood and affect. Her behavior is normal. Judgment and thought content normal.          Assessment & Plan:

## 2016-01-16 NOTE — Assessment & Plan Note (Signed)
Fair bp, continue current meds.

## 2016-01-16 NOTE — Assessment & Plan Note (Signed)
Obtain follow up lipid panel.  

## 2016-01-16 NOTE — Patient Instructions (Addendum)
Restart lexapro 1/2 tab one daily for 1 week, then increase to a full tab once weekly on week two.  Complete lab work prior to leaving.

## 2016-01-16 NOTE — Assessment & Plan Note (Signed)
Will increase victoza to help with sugar and weight loss efforts.

## 2016-01-17 ENCOUNTER — Encounter: Payer: Self-pay | Admitting: Family

## 2016-01-17 LAB — URINE CULTURE

## 2016-01-18 MED ORDER — ALPRAZOLAM 1 MG PO TABS: 1.0000 mg | ORAL_TABLET | Freq: Two times a day (BID) | ORAL | 0 refills | Status: DC | PRN

## 2016-01-18 NOTE — Telephone Encounter (Signed)
Kimberly Lin--pt provided UDS on 01/16/16.  I will print alprazolam Rx.  Please advise additional questions in above message.

## 2016-01-18 NOTE — Telephone Encounter (Signed)
I believe I gave her a printed copy of the Ambien last visit.  Let Dr. Maryjean Ka refill cyclobenzaprine.

## 2016-01-18 NOTE — Telephone Encounter (Signed)
Also, urine culture showed contamination, not true UTI.

## 2016-01-19 ENCOUNTER — Encounter: Payer: Self-pay | Admitting: Family

## 2016-01-21 NOTE — Telephone Encounter (Signed)
Spoke with pt and advised her to use printed zolpidem rx for local pharmacy and we will send 30 day supply to [facility] next refill. Alprazolam Rx shredded as pt states she did not need it at this time.

## 2016-01-21 NOTE — Telephone Encounter (Signed)
Left message for pt to return my call.

## 2016-01-21 NOTE — Telephone Encounter (Signed)
Noted  

## 2016-01-22 ENCOUNTER — Telehealth: Payer: Self-pay | Admitting: Family

## 2016-01-22 NOTE — Telephone Encounter (Signed)
MyChart message also sent encouraging patient to return call.

## 2016-01-22 NOTE — Telephone Encounter (Signed)
Patient called stating that she would like a call from [place], she would not specify why. Please advise   Patient phone: [phone]

## 2016-01-22 NOTE — Telephone Encounter (Signed)
Spoke with pt. States she could not get mychart to work earlier but it's working now and nothing is needed at this time.

## 2016-01-22 NOTE — Telephone Encounter (Signed)
Please contact pt to get more info re: colitis.  She can start be seeing one of our providers.  If severe symptoms she should go to the ER.

## 2016-01-22 NOTE — Telephone Encounter (Signed)
Attempted to call patient again.  No answer. Awaiting return call or response via MyChart.

## 2016-01-22 NOTE — Telephone Encounter (Signed)
Attempted to reach patient regarding the below medical concern. Left message for patient to return call when available.

## 2016-01-23 NOTE — Telephone Encounter (Signed)
Pt returned call. She has been having diarrhea since prior to last appointment, but forgot to mention it at her appt. States she has IBS and has had colitis in the past. She states the diarrhea stopped yesterday. She is taking Immodium and plans to continue unless recommended otherwise. She denies fever and abdominal pain. She has noted some spots of bright red blood on toilet paper only and feels like she might have a hemorrhoid. Denies dark/bloody stool. She endorses that her anal area is very raw from the diarrhea and she could be having bleeding from that as well.   I did not schedule OV given that symptoms have resolved at this time. Please advise if any additional recommendations or changes to meds or if you would like her to schedule appt. Pt prefers to be contacted via [place].

## 2016-01-29 ENCOUNTER — Encounter: Payer: Self-pay | Admitting: Family

## 2016-01-29 DIAGNOSIS — M4727 Other spondylosis with radiculopathy, lumbosacral region: Secondary | ICD-10-CM | POA: Diagnosis not present

## 2016-01-29 DIAGNOSIS — M5417 Radiculopathy, lumbosacral region: Secondary | ICD-10-CM | POA: Diagnosis not present

## 2016-01-29 MED ORDER — ALPRAZOLAM 1 MG PO TABS: 1.0000 mg | ORAL_TABLET | Freq: Two times a day (BID) | ORAL | 0 refills | Status: DC | PRN

## 2016-01-29 NOTE — Telephone Encounter (Signed)
Rx printed and forwarded to PCP for signature. Per previous email. Pt was supposed to follow up for her diarrhea / rectal bleeding. Will email her once Rx has been signed and address need for OV.

## 2016-02-25 ENCOUNTER — Encounter: Payer: Self-pay | Admitting: Family

## 2016-02-25 MED ORDER — ESCITALOPRAM OXALATE 20 MG PO TABS: 20.0000 mg | ORAL_TABLET | Freq: Every day | ORAL | 1 refills | Status: DC

## 2016-02-25 MED ORDER — LIRAGLUTIDE 18 MG/3ML ~~LOC~~ SOPN: PEN_INJECTOR | SUBCUTANEOUS | 1 refills | Status: DC

## 2016-02-26 ENCOUNTER — Encounter: Payer: Self-pay | Admitting: Family

## 2016-02-28 ENCOUNTER — Encounter: Payer: Self-pay | Admitting: Family

## 2016-02-28 MED ORDER — ESOMEPRAZOLE MAGNESIUM 40 MG PO CPDR: 40.0000 mg | DELAYED_RELEASE_CAPSULE | Freq: Every day | ORAL | 2 refills | Status: DC

## 2016-02-28 MED ORDER — BETAMETHASONE DIPROPIONATE 0.05 % EX CREA: TOPICAL_CREAM | Freq: Two times a day (BID) | CUTANEOUS | 0 refills | Status: DC

## 2016-02-28 NOTE — Addendum Note (Signed)
Addended by: Debbrah Alar on: 02/28/2016 09:05 AM   Modules accepted: Orders

## 2016-02-29 MED ORDER — ZOLPIDEM TARTRATE 10 MG PO TABS: 10.0000 mg | ORAL_TABLET | Freq: Every evening | ORAL | 0 refills | Status: DC | PRN

## 2016-02-29 MED ORDER — ALPRAZOLAM 1 MG PO TABS: 1.0000 mg | ORAL_TABLET | Freq: Two times a day (BID) | ORAL | 0 refills | Status: DC | PRN

## 2016-02-29 NOTE — Telephone Encounter (Signed)
See 02/28/16 patient email.

## 2016-02-29 NOTE — Telephone Encounter (Signed)
Last OV: 01/17/16 Next OV: 04/2016 UDS: 09/2015 and due for repeat. Will obtain at next office visit.  Pt is requesting to use [facility] for both zolpidem and alprazolam. Can we give her a 90 day supply of each? Otherwise, she is having to request each medication 1-2 weeks early in order to receive a shipment before she runs out. She says she pays nothing for these medications if she gets these through [facility] instead of at [facility].  Please advise?

## 2016-02-29 NOTE — Telephone Encounter (Signed)
Ok.  See rx's.

## 2016-03-03 ENCOUNTER — Encounter: Payer: Self-pay | Admitting: Family

## 2016-03-03 MED ORDER — ALPRAZOLAM 1 MG PO TABS: 1.0000 mg | ORAL_TABLET | Freq: Two times a day (BID) | ORAL | 0 refills | Status: DC | PRN

## 2016-03-03 MED ORDER — ZOLPIDEM TARTRATE 10 MG PO TABS: 10.0000 mg | ORAL_TABLET | Freq: Every evening | ORAL | 0 refills | Status: DC | PRN

## 2016-03-05 DIAGNOSIS — H04123 Dry eye syndrome of bilateral lacrimal glands: Secondary | ICD-10-CM | POA: Diagnosis not present

## 2016-03-05 DIAGNOSIS — H40023 Open angle with borderline findings, high risk, bilateral: Secondary | ICD-10-CM | POA: Diagnosis not present

## 2016-03-17 ENCOUNTER — Encounter: Payer: Self-pay | Admitting: Family

## 2016-03-18 MED ORDER — PANTOPRAZOLE SODIUM 40 MG PO TBEC: 40.0000 mg | DELAYED_RELEASE_TABLET | Freq: Every day | ORAL | 1 refills | Status: DC

## 2016-03-25 ENCOUNTER — Emergency Department (HOSPITAL_BASED_OUTPATIENT_CLINIC_OR_DEPARTMENT_OTHER): Payer: Medicare Other

## 2016-03-25 ENCOUNTER — Inpatient Hospital Stay (HOSPITAL_BASED_OUTPATIENT_CLINIC_OR_DEPARTMENT_OTHER)
Admission: EM | Admit: 2016-03-25 | Discharge: 2016-03-28 | DRG: 871 | Disposition: A | Discharge status: Home / Self Care | Payer: Medicare Other | Attending: Internal Medicine | Admitting: Internal Medicine

## 2016-03-25 ENCOUNTER — Ambulatory Visit: Payer: Self-pay | Admitting: Family Medicine

## 2016-03-25 ENCOUNTER — Encounter (HOSPITAL_BASED_OUTPATIENT_CLINIC_OR_DEPARTMENT_OTHER): Payer: Self-pay | Admitting: *Deleted

## 2016-03-25 ENCOUNTER — Telehealth: Payer: Self-pay | Admitting: Family

## 2016-03-25 DIAGNOSIS — Z833 Family history of diabetes mellitus: Secondary | ICD-10-CM

## 2016-03-25 DIAGNOSIS — I129 Hypertensive chronic kidney disease with stage 1 through stage 4 chronic kidney disease, or unspecified chronic kidney disease: Secondary | ICD-10-CM | POA: Diagnosis present

## 2016-03-25 DIAGNOSIS — E876 Hypokalemia: Secondary | ICD-10-CM | POA: Diagnosis present

## 2016-03-25 DIAGNOSIS — G4733 Obstructive sleep apnea (adult) (pediatric): Secondary | ICD-10-CM | POA: Diagnosis present

## 2016-03-25 DIAGNOSIS — Y92009 Unspecified place in unspecified non-institutional (private) residence as the place of occurrence of the external cause: Secondary | ICD-10-CM | POA: Diagnosis not present

## 2016-03-25 DIAGNOSIS — F419 Anxiety disorder, unspecified: Secondary | ICD-10-CM | POA: Diagnosis present

## 2016-03-25 DIAGNOSIS — W19XXXA Unspecified fall, initial encounter: Secondary | ICD-10-CM | POA: Diagnosis present

## 2016-03-25 DIAGNOSIS — Z86010 Personal history of colonic polyps: Secondary | ICD-10-CM

## 2016-03-25 DIAGNOSIS — Z96653 Presence of artificial knee joint, bilateral: Secondary | ICD-10-CM | POA: Diagnosis present

## 2016-03-25 DIAGNOSIS — Z794 Long term (current) use of insulin: Secondary | ICD-10-CM

## 2016-03-25 DIAGNOSIS — E1122 Type 2 diabetes mellitus with diabetic chronic kidney disease: Secondary | ICD-10-CM | POA: Diagnosis present

## 2016-03-25 DIAGNOSIS — R4182 Altered mental status, unspecified: Secondary | ICD-10-CM | POA: Insufficient documentation

## 2016-03-25 DIAGNOSIS — E785 Hyperlipidemia, unspecified: Secondary | ICD-10-CM | POA: Diagnosis present

## 2016-03-25 DIAGNOSIS — Z888 Allergy status to other drugs, medicaments and biological substances status: Secondary | ICD-10-CM

## 2016-03-25 DIAGNOSIS — D631 Anemia in chronic kidney disease: Secondary | ICD-10-CM | POA: Diagnosis present

## 2016-03-25 DIAGNOSIS — R000 Tachycardia, unspecified: Secondary | ICD-10-CM | POA: Diagnosis present

## 2016-03-25 DIAGNOSIS — I10 Essential (primary) hypertension: Secondary | ICD-10-CM | POA: Diagnosis present

## 2016-03-25 DIAGNOSIS — Z79899 Other long term (current) drug therapy: Secondary | ICD-10-CM

## 2016-03-25 DIAGNOSIS — Z9889 Other specified postprocedural states: Secondary | ICD-10-CM

## 2016-03-25 DIAGNOSIS — E1121 Type 2 diabetes mellitus with diabetic nephropathy: Secondary | ICD-10-CM | POA: Diagnosis present

## 2016-03-25 DIAGNOSIS — G9340 Encephalopathy, unspecified: Secondary | ICD-10-CM | POA: Diagnosis present

## 2016-03-25 DIAGNOSIS — E1165 Type 2 diabetes mellitus with hyperglycemia: Secondary | ICD-10-CM | POA: Diagnosis not present

## 2016-03-25 DIAGNOSIS — Z7984 Long term (current) use of oral hypoglycemic drugs: Secondary | ICD-10-CM | POA: Diagnosis not present

## 2016-03-25 DIAGNOSIS — K219 Gastro-esophageal reflux disease without esophagitis: Secondary | ICD-10-CM | POA: Diagnosis present

## 2016-03-25 DIAGNOSIS — Z8261 Family history of arthritis: Secondary | ICD-10-CM

## 2016-03-25 DIAGNOSIS — F32A Depression, unspecified: Secondary | ICD-10-CM | POA: Diagnosis present

## 2016-03-25 DIAGNOSIS — Z8249 Family history of ischemic heart disease and other diseases of the circulatory system: Secondary | ICD-10-CM | POA: Diagnosis not present

## 2016-03-25 DIAGNOSIS — F329 Major depressive disorder, single episode, unspecified: Secondary | ICD-10-CM | POA: Diagnosis present

## 2016-03-25 DIAGNOSIS — N183 Chronic kidney disease, stage 3 (moderate): Secondary | ICD-10-CM | POA: Diagnosis present

## 2016-03-25 DIAGNOSIS — E871 Hypo-osmolality and hyponatremia: Secondary | ICD-10-CM | POA: Diagnosis present

## 2016-03-25 DIAGNOSIS — N179 Acute kidney failure, unspecified: Secondary | ICD-10-CM

## 2016-03-25 DIAGNOSIS — E1140 Type 2 diabetes mellitus with diabetic neuropathy, unspecified: Secondary | ICD-10-CM | POA: Diagnosis present

## 2016-03-25 DIAGNOSIS — A419 Sepsis, unspecified organism: Principal | ICD-10-CM | POA: Diagnosis present

## 2016-03-25 DIAGNOSIS — Z801 Family history of malignant neoplasm of trachea, bronchus and lung: Secondary | ICD-10-CM

## 2016-03-25 DIAGNOSIS — F3289 Other specified depressive episodes: Secondary | ICD-10-CM | POA: Diagnosis not present

## 2016-03-25 DIAGNOSIS — R296 Repeated falls: Secondary | ICD-10-CM | POA: Diagnosis present

## 2016-03-25 DIAGNOSIS — N390 Urinary tract infection, site not specified: Secondary | ICD-10-CM | POA: Diagnosis present

## 2016-03-25 DIAGNOSIS — R42 Dizziness and giddiness: Secondary | ICD-10-CM | POA: Diagnosis not present

## 2016-03-25 DIAGNOSIS — F418 Other specified anxiety disorders: Secondary | ICD-10-CM | POA: Diagnosis not present

## 2016-03-25 DIAGNOSIS — Z9181 History of falling: Secondary | ICD-10-CM

## 2016-03-25 DIAGNOSIS — Z800 Family history of malignant neoplasm of digestive organs: Secondary | ICD-10-CM

## 2016-03-25 DIAGNOSIS — Z9049 Acquired absence of other specified parts of digestive tract: Secondary | ICD-10-CM

## 2016-03-25 DIAGNOSIS — M797 Fibromyalgia: Secondary | ICD-10-CM | POA: Diagnosis present

## 2016-03-25 DIAGNOSIS — R0902 Hypoxemia: Secondary | ICD-10-CM | POA: Diagnosis present

## 2016-03-25 DIAGNOSIS — Z803 Family history of malignant neoplasm of breast: Secondary | ICD-10-CM | POA: Diagnosis not present

## 2016-03-25 DIAGNOSIS — S0990XA Unspecified injury of head, initial encounter: Secondary | ICD-10-CM | POA: Diagnosis not present

## 2016-03-25 LAB — COMPREHENSIVE METABOLIC PANEL
ALT: 23 U/L (ref 14–54)
AST: 65 U/L — AB (ref 15–41)
Albumin: 2.9 g/dL — ABNORMAL LOW (ref 3.5–5.0)
Alkaline Phosphatase: 84 U/L (ref 38–126)
Anion gap: 12 (ref 5–15)
BUN: 29 mg/dL — AB (ref 6–20)
CALCIUM: 8.4 mg/dL — AB (ref 8.9–10.3)
CHLORIDE: 88 mmol/L — AB (ref 101–111)
CO2: 27 mmol/L (ref 22–32)
CREATININE: 1.48 mg/dL — AB (ref 0.44–1.00)
GFR calc non Af Amer: 35 mL/min — ABNORMAL LOW (ref >60)
GFR, EST AFRICAN AMERICAN: 40 mL/min — AB (ref >60)
Glucose, Bld: 292 mg/dL — ABNORMAL HIGH (ref 65–99)
Potassium: 3.8 mmol/L (ref 3.5–5.1)
SODIUM: 127 mmol/L — AB (ref 135–145)
Total Bilirubin: 1 mg/dL (ref 0.3–1.2)
Total Protein: 7.3 g/dL (ref 6.5–8.1)

## 2016-03-25 LAB — URINALYSIS, ROUTINE W REFLEX MICROSCOPIC
BILIRUBIN URINE: NEGATIVE
KETONES UR: NEGATIVE mg/dL
NITRITE: POSITIVE — AB
PH: 6 (ref 5.0–8.0)
Protein, ur: 30 mg/dL — AB
SPECIFIC GRAVITY, URINE: 1.014 (ref 1.005–1.030)

## 2016-03-25 LAB — I-STAT CG4 LACTIC ACID, ED
Lactic Acid, Venous: 2.29 mmol/L (ref 0.5–1.9)
Lactic Acid, Venous: 1.89 mmol/L (ref 0.5–1.9)

## 2016-03-25 LAB — URINALYSIS, MICROSCOPIC (REFLEX)

## 2016-03-25 LAB — CBC WITH DIFFERENTIAL/PLATELET
BASOS PCT: 0 %
Basophils Absolute: 0 10*3/uL (ref 0.0–0.1)
Eosinophils Absolute: 0.3 10*3/uL (ref 0.0–0.7)
Eosinophils Relative: 3 %
HEMATOCRIT: 36.6 % (ref 36.0–46.0)
HEMOGLOBIN: 12.3 g/dL (ref 12.0–15.0)
LYMPHS ABS: 0.5 10*3/uL — AB (ref 0.7–4.0)
LYMPHS PCT: 5 %
MCH: 30.8 pg (ref 26.0–34.0)
MCHC: 33.6 g/dL (ref 30.0–36.0)
MCV: 91.5 fL (ref 78.0–100.0)
MONO ABS: 0.7 10*3/uL (ref 0.1–1.0)
MONOS PCT: 7 %
NEUTROS ABS: 8.5 10*3/uL — AB (ref 1.7–7.7)
NEUTROS PCT: 85 %
Platelets: 163 10*3/uL (ref 150–400)
RBC: 4 MIL/uL (ref 3.87–5.11)
RDW: 13.8 % (ref 11.5–15.5)
WBC: 10 10*3/uL (ref 4.0–10.5)

## 2016-03-25 LAB — ETHANOL: Alcohol, Ethyl (B): <5 mg/dL (ref <5)

## 2016-03-25 LAB — TROPONIN I: Troponin I: <0.03 ng/mL (ref <0.03)

## 2016-03-25 LAB — LIPASE, BLOOD: LIPASE: 18 U/L (ref 11–51)

## 2016-03-25 LAB — CBG MONITORING, ED: Glucose-Capillary: 295 mg/dL — ABNORMAL HIGH (ref 65–99)

## 2016-03-25 MED ORDER — SODIUM CHLORIDE 0.9 % IV BOLUS (SEPSIS)
500.0000 mL | Freq: Once | INTRAVENOUS | Status: AC
  Administered 2016-03-25: 500 mL via INTRAVENOUS

## 2016-03-25 MED ORDER — CEFTRIAXONE SODIUM 1 G IJ SOLR
1.0000 g | Freq: Once | INTRAMUSCULAR | Status: AC
  Administered 2016-03-25: 1 g via INTRAVENOUS
  Filled 2016-03-25: qty 10

## 2016-03-25 NOTE — ED Notes (Signed)
Attempted to give report to nurse, stated she wasn't ready.  Gave my name and number to call me back for report.

## 2016-03-25 NOTE — Telephone Encounter (Signed)
Appt scheduled to see Dr. Charlett Blake today (03/25/16) at 5:45pm.

## 2016-03-25 NOTE — ED Provider Notes (Signed)
Emergency Department Provider Note   I have reviewed the triage vital signs and the nursing notes.   HISTORY  Chief Complaint  Dizziness and Fall   HPI Kimberly Lin is a 71 y.o. female with PMH of DM, GERD, HTN, HLD presents to the emergency department for evaluation of intermittent vertigo, frequent falls, confusion over the past several days. Family report some associated flulike symptoms that seem to be resolving. They noticed a tactile fever today but patient has no documented fevers at home. They do report some shaking chills. Patient has had peripheral vertigo in the past and had some left over Antivert at home which she took without relief. Symptoms are worse with movement. She denies any unilateral weakness or numbness. Patient did have a fall at home today which prompted the emergency department visit. No head trauma.  Past Medical History:  Diagnosis Date  . Abdominal mass   . Abnormal nuclear stress test 07/05/2014  . Anxiety   . Arthritis    osteoarthritis  . Colitis 2014  . Depression   . Diabetes mellitus   . Fibromyalgia   . GERD (gastroesophageal reflux disease)   . Hiatal hernia   . History of chicken pox   . History of septic shock 12/2009  . Hyperlipidemia   . Hypertension   . Migraine   . OSA (obstructive sleep apnea) 03/12/2011  . Personal history of colonic polyps   . Spinal stenosis     Patient Active Problem List   Diagnosis Date Noted  . Sepsis secondary to UTI ([place]) 03/26/2016  . Falls 03/26/2016  . Acute encephalopathy 03/26/2016  . Hyponatremia 03/26/2016  . Anxiety and depression 03/26/2016  . Altered mental status 03/25/2016  . Abnormal nuclear stress test 07/05/2014  . Fatty liver 03/26/2014  . Intertrigo 03/24/2014  . Allergic rhinitis 01/02/2014  . Incontinence, feces 11/10/2013  . Musculoskeletal pain 11/08/2013  . Dysuria 04/16/2013  . Benign paroxysmal positional vertigo 07/19/2012  . Obese 09/17/2011  . Knee pain  09/17/2011  . Weight gain 06/17/2011  . DOE (dyspnea on exertion) 04/15/2011  . OSA (obstructive sleep apnea) 03/12/2011  . Insomnia 01/15/2011  . Diabetes type 2, uncontrolled ([place]) 11/27/2010  . PVC (premature ventricular contraction) 11/27/2010  . Fibromyalgia   . Depression   . GERD (gastroesophageal reflux disease)   . Hyperlipidemia   . Hypertension   . Personal history of colonic polyps   . Migraine     Past Surgical History:  Procedure Laterality Date  . APPENDECTOMY  1983  . BREAST BIOPSY Right 2007, 2009  . CARDIAC CATHETERIZATION N/A 07/05/2014   Procedure: Left Heart Cath and Coronary Angiography;  Surgeon: Sherren Mocha, MD;  Location: [place] [facility];  Service: Cardiovascular;  Laterality: N/A;  . COLONOSCOPY  2014   normal   . MEDIAL PARTIAL KNEE REPLACEMENT Bilateral 2006  . OVARIAN CYST REMOVAL  2008   Pt states she had a ?cyst removed ?ovaries  . POLYPECTOMY  1998      Allergies Erythromycin and Prednisone  Family History  Problem Relation Age of Onset  . Arthritis Mother   . Hyperlipidemia Mother   . Hypertension Mother   . Diabetes Mother   . Heart disease Mother   . Breast cancer Mother   . Lung cancer Brother   . Heart disease Brother     x 2  . Hypertension Brother   . Hyperlipidemia Brother   . Diabetes Brother   . Sudden death Father   .  Lung cancer Father   . Rectal cancer Father   . Diabetes Sister   . Hyperlipidemia Sister   . Hypertension Sister   . Diabetes Maternal Aunt   . Heart attack Maternal Aunt   . Hyperlipidemia Maternal Aunt   . Hypertension Maternal Aunt   . Hypertension Maternal Uncle   . Hyperlipidemia Maternal Uncle   . Heart attack Maternal Uncle   . Diabetes Maternal Uncle   . Diabetes Paternal Aunt   . Heart attack Paternal Aunt   . Hyperlipidemia Paternal Aunt   . Hypertension Paternal Aunt   . Hypertension Paternal Uncle   . Hyperlipidemia Paternal Uncle   . Heart attack Paternal Uncle   .  Diabetes Paternal Uncle   . Prostate cancer Brother     Social History Social History  Substance Use Topics  . Smoking status: Former Smoker    Types: Cigarettes  . Smokeless tobacco: Never Used     Comment: Smoked socially.  . Alcohol use 3.6 - 4.8 oz/week    6 - 8 Glasses of wine per week    Review of Systems  Constitutional: Positive fever/chills. Positive frequent falls.  Eyes: No visual changes. ENT: No sore throat. Positive intermittent vertigo.  Cardiovascular: Denies chest pain. Respiratory: Denies shortness of breath. Gastrointestinal: No abdominal pain.  No nausea, no vomiting.  No diarrhea.  No constipation. Genitourinary: Negative for dysuria. Musculoskeletal: Negative for back pain. Skin: Negative for rash. Neurological: Negative for headaches, focal weakness or numbness.  10-point ROS otherwise negative.  ____________________________________________   PHYSICAL EXAM:  VITAL SIGNS: ED Triage Vitals  Enc Vitals Group     BP 03/25/16 1512 155/78     Pulse Rate 03/25/16 1512 (!) 125     Resp 03/25/16 1512 20     Temp 03/25/16 1512 97.8 F (36.6 C)     Temp Source 03/25/16 1512 Oral     SpO2 03/25/16 1512 96 %     Weight 03/25/16 1510 199 lb (90.3 kg)     Height 03/25/16 1510 5\' 4"  (1.626 m)     Pain Score 03/25/16 1510 4   Constitutional: Alert but saying bizarre things at times and keeps applying face mask over eyes despite multiple corrections and reminders by family. Seems confused.  Eyes: Conjunctivae are normal.  Head: Atraumatic. Nose: No congestion/rhinnorhea. Mouth/Throat: Mucous membranes are dry.  Neck: No stridor.  No meningeal signs.   Cardiovascular: Normal rate, regular rhythm. Good peripheral circulation. Grossly normal heart sounds.   Respiratory: Normal respiratory effort.  No retractions. Lungs CTAB. Gastrointestinal: Soft and nontender. No distention.  Musculoskeletal: No lower extremity tenderness nor edema. No gross deformities  of extremities. Neurologic:  Normal speech and language. No gross focal neurologic deficits are appreciated. Some difficulty with past-pointing on finger-to-nose testing.  Skin:  Skin is warm, dry and intact. No rash noted. Abrasion to the right ankle with no swelling.   ____________________________________________   LABS (all labs ordered are listed, but only abnormal results are displayed)  Labs Reviewed  COMPREHENSIVE METABOLIC PANEL - Abnormal; Notable for the following:       Result Value   Sodium 127 (*)    Chloride 88 (*)    Glucose, Bld 292 (*)    BUN 29 (*)    Creatinine, Ser 1.48 (*)    Calcium 8.4 (*)    Albumin 2.9 (*)    AST 65 (*)    GFR calc non Af Amer 35 (*)    GFR  calc Af Amer 40 (*)    All other components within normal limits  CBC WITH DIFFERENTIAL/PLATELET - Abnormal; Notable for the following:    Neutro Abs 8.5 (*)    Lymphs Abs 0.5 (*)    All other components within normal limits  URINALYSIS, ROUTINE W REFLEX MICROSCOPIC - Abnormal; Notable for the following:    APPearance CLOUDY (*)    Glucose, UA >=500 (*)    Hgb urine dipstick MODERATE (*)    Protein, ur 30 (*)    Nitrite POSITIVE (*)    Leukocytes, UA SMALL (*)    All other components within normal limits  URINALYSIS, MICROSCOPIC (REFLEX) - Abnormal; Notable for the following:    Bacteria, UA MANY (*)    Squamous Epithelial / LPF 0-5 (*)    All other components within normal limits  CBC - Abnormal; Notable for the following:    WBC 10.7 (*)    RBC 3.79 (*)    Hemoglobin 11.6 (*)    HCT 34.3 (*)    All other components within normal limits  BASIC METABOLIC PANEL - Abnormal; Notable for the following:    Sodium 129 (*)    Potassium 3.4 (*)    Chloride 91 (*)    Glucose, Bld 193 (*)    Creatinine, Ser 1.15 (*)    Calcium 8.0 (*)    GFR calc non Af Amer 47 (*)    GFR calc Af Amer 55 (*)    All other components within normal limits  TROPONIN I - Abnormal; Notable for the following:     Troponin I 0.05 (*)    All other components within normal limits  GLUCOSE, CAPILLARY - Abnormal; Notable for the following:    Glucose-Capillary 180 (*)    All other components within normal limits  CBG MONITORING, ED - Abnormal; Notable for the following:    Glucose-Capillary 295 (*)    All other components within normal limits  I-STAT CG4 LACTIC ACID, ED - Abnormal; Notable for the following:    Lactic Acid, Venous 2.29 (*)    All other components within normal limits  URINE CULTURE  ETHANOL  LIPASE, BLOOD  TROPONIN I  LACTIC ACID, PLASMA  LACTIC ACID, PLASMA  PROCALCITONIN  TROPONIN I  I-STAT CG4 LACTIC ACID, ED   ____________________________________________  EKG   EKG Interpretation  Date/Time:  Tuesday March 25 2016 16:14:39 EST Ventricular Rate:  128 PR Interval:    QRS Duration: 85 QT Interval:  306 QTC Calculation: 447 R Axis:   22 Text Interpretation:  Sinus tachycardia Low voltage, extremity and precordial leads No STEMI.  Confirmed by Makaya Juneau MD, Maeleigh Buschman [phone]) on 03/25/2016 4:52:12 PM       ____________________________________________  RADIOLOGY  Dg Chest 2 View  Result Date: 03/25/2016 CLINICAL DATA:  Confusion and dizziness EXAM: CHEST  2 VIEW COMPARISON:  December 19, 2014 FINDINGS: There is no edema or consolidation. Heart is upper normal in size with pulmonary vascularity within normal limits. There is atherosclerotic calcification in the aorta. No adenopathy. There is evidence of an old fracture the lateral left clavicle with remodeling. IMPRESSION: No edema or consolidation.  Aortic atherosclerosis. Electronically Signed   By: Lowella Grip III M.D.   On: 03/25/2016 16:12   Ct Head Wo Contrast  Result Date: 03/25/2016 CLINICAL DATA:  Vertigo x2 weeks, fall, posterior head injury, confusion EXAM: CT HEAD WITHOUT CONTRAST TECHNIQUE: Contiguous axial images were obtained from the base of the skull through the vertex  without intravenous contrast.  COMPARISON:  MRI brain dated 11/13/2012 FINDINGS: Brain: No evidence of acute infarction, hemorrhage, hydrocephalus, extra-axial collection or mass lesion/mass effect. Global cortical atrophy. Subcortical white matter and periventricular small vessel ischemic changes. Vascular: No hyperdense vessel or unexpected calcification. Skull: Normal. Negative for fracture or focal lesion. Sinuses/Orbits: The visualized paranasal sinuses are essentially clear. The mastoid air cells are unopacified. Other: None. IMPRESSION: No evidence of acute intracranial abnormality. Atrophy with small vessel ischemic changes. Electronically Signed   By: Julian Hy M.D.   On: 03/25/2016 16:16    ____________________________________________   PROCEDURES  Procedure(s) performed:   Procedures  None ____________________________________________   INITIAL IMPRESSION / ASSESSMENT AND PLAN / ED COURSE  Pertinent labs & imaging results that were available during my care of the patient were reviewed by me and considered in my medical decision making (see chart for details).  Patient resents to the emergency department for evaluation of vertigo, frequent falls, occasional nonsensical speech. Patient has noticed some tactile fever today. Patient is somewhat bizarre during our interaction. She is able to give a history but sometimes says nonsensical things. My suspicion for central vertigo is somewhat elevated. She also has an elevated lactic acid which may be from dehydration. She is afebrile here and no chest pain. She has some abnormal finger to nose testing on my exam. Plan for admission and MRI/CVA w/u and also r/o infectious etiology to explain symptoms.   Patient with UTI on UA which seems to explain symptoms. Lower suspicion for CVA in this setting. Explained need for hospitalization and abx. Will transfer to [facility].   Discussed patient's case with hospitalist  Patient and family (if present) updated with plan. Care  transferred to hospitalist service.  I reviewed all nursing notes, vitals, pertinent old records, EKGs, labs, imaging (as available).  ____________________________________________  FINAL CLINICAL IMPRESSION(S) / ED DIAGNOSES  Final diagnoses:  AKI (acute kidney injury) ([place])  Urinary tract infection without hematuria, site unspecified     MEDICATIONS GIVEN DURING THIS VISIT:  Medications  enoxaparin (LOVENOX) injection 40 mg (40 mg Subcutaneous Given 03/26/16 1022)  sodium chloride flush (NS) 0.9 % injection 3 mL (3 mLs Intravenous Given 03/26/16 1000)  ondansetron (ZOFRAN) tablet 4 mg (not administered)    Or  ondansetron (ZOFRAN) injection 4 mg (not administered)  acetaminophen (TYLENOL) tablet 650 mg (not administered)    Or  acetaminophen (TYLENOL) suppository 650 mg (not administered)  albuterol (PROVENTIL) (2.5 MG/3ML) 0.083% nebulizer solution 2.5 mg (not administered)  ipratropium (ATROVENT) nebulizer solution 0.5 mg (not administered)  cefTRIAXone (ROCEPHIN) 2 g in dextrose 5 % 50 mL IVPB (2 g Intravenous Given 03/26/16 0559)  insulin aspart (novoLOG) injection 0-5 Units (not administered)  ALPRAZolam (XANAX) tablet 1 mg (not administered)  aspirin EC tablet 81 mg (81 mg Oral Given 03/26/16 1000)  calcium-vitamin D (OSCAL WITH D) 500-200 MG-UNIT per tablet 1 tablet (1 tablet Oral Given 03/26/16 1022)  multivitamin with minerals tablet 1 tablet (1 tablet Oral Given 03/26/16 1022)  diphenhydrAMINE (BENADRYL) capsule 25 mg (not administered)  gabapentin (NEURONTIN) capsule 300 mg (300 mg Oral Given 03/26/16 1023)  atorvastatin (LIPITOR) tablet 40 mg (40 mg Oral Given 03/26/16 1023)  amLODipine (NORVASC) tablet 5 mg (5 mg Oral Given 03/26/16 1023)  metoprolol succinate (TOPROL-XL) 24 hr tablet 50 mg (50 mg Oral Given 03/26/16 1022)  magnesium oxide (MAG-OX) tablet 400 mg (400 mg Oral Given 03/26/16 1023)  meclizine (ANTIVERT) tablet 25 mg (not administered)  pantoprazole (PROTONIX)  EC tablet 40 mg (not administered)  losartan (COZAAR) tablet 50 mg (50 mg Oral Given 03/26/16 1000)  insulin aspart (novoLOG) injection 0-15 Units (3 Units Subcutaneous Given 03/26/16 0839)  venlafaxine XR (EFFEXOR-XR) 24 hr capsule 150 mg (150 mg Oral Given 03/26/16 0800)  sodium chloride 0.9 % bolus 500 mL (0 mLs Intravenous Stopped 03/25/16 1713)  sodium chloride 0.9 % bolus 500 mL (0 mLs Intravenous Stopped 03/25/16 1927)  cefTRIAXone (ROCEPHIN) 1 g in dextrose 5 % 50 mL IVPB (0 g Intravenous Stopped 03/25/16 1927)  0.9 %  sodium chloride infusion ( Intravenous New Bag/Given 03/26/16 0400)     NEW OUTPATIENT MEDICATIONS STARTED DURING THIS VISIT:  None   Note:  This document was prepared using Dragon voice recognition software and may include unintentional dictation errors.  Nanda Quinton, MD Emergency Medicine   Margette Fast, MD 03/26/16 1130

## 2016-03-25 NOTE — Telephone Encounter (Signed)
[facility] Day - Client TELEPHONE ADVICE RECORD [facility] Patient Name: Kimberly Lin DOB: 1946/02/07 Initial Comment Caller states she is cold then hot. She has a lot of coughing and chest pain. Nurse Assessment Nurse: Dimas Chyle, RN, Dellis Filbert Date/Time Eilene Ghazi Time): 03/25/2016 9:43:35 AM Confirm and document reason for call. If symptomatic, describe symptoms. ---Caller states she is cold then hot. She has a lot of coughing and chest pain. Since last Thursday. Fever on and off. Does the patient have any new or worsening symptoms? ---Yes Will a triage be completed? ---Yes Related visit to physician within the last 2 weeks? ---No Does the PT have any chronic conditions? (i.e. diabetes, asthma, etc.) ---Yes List chronic conditions. ---Diabetes type 2 and HTN Is this a behavioral health or substance abuse call? ---No Guidelines Guideline Title Affirmed Question Affirmed Notes Cough - Acute Non-Productive [1] Fever > 100.5 F (38.1 C) AND [2] diabetes mellitus or weak immune system (e.g., HIV positive, cancer chemo, splenectomy, organ transplant, chronic steroids) Final Disposition User See Physician within 4 Hours (or PCP triage) Dimas Chyle, RN, Dellis Filbert Comments Caller did not want to be seen at another office and only appointment available at this office was with Dr. Charlett Blake at 5:45 p.m. Referrals REFERRED TO PCP OFFICE Disagree/Comply: Comply

## 2016-03-25 NOTE — ED Triage Notes (Signed)
Dizziness, frequent falls for the past 5 days. She is confused and rambling speech.

## 2016-03-25 NOTE — ED Notes (Signed)
Patient transported to CT and xray 

## 2016-03-26 ENCOUNTER — Inpatient Hospital Stay (HOSPITAL_COMMUNITY): Payer: Medicare Other

## 2016-03-26 DIAGNOSIS — I10 Essential (primary) hypertension: Secondary | ICD-10-CM

## 2016-03-26 DIAGNOSIS — F329 Major depressive disorder, single episode, unspecified: Secondary | ICD-10-CM | POA: Diagnosis present

## 2016-03-26 DIAGNOSIS — A419 Sepsis, unspecified organism: Secondary | ICD-10-CM

## 2016-03-26 DIAGNOSIS — F419 Anxiety disorder, unspecified: Secondary | ICD-10-CM

## 2016-03-26 DIAGNOSIS — E871 Hypo-osmolality and hyponatremia: Secondary | ICD-10-CM | POA: Diagnosis present

## 2016-03-26 DIAGNOSIS — G9340 Encephalopathy, unspecified: Secondary | ICD-10-CM

## 2016-03-26 DIAGNOSIS — E1140 Type 2 diabetes mellitus with diabetic neuropathy, unspecified: Secondary | ICD-10-CM

## 2016-03-26 DIAGNOSIS — W19XXXA Unspecified fall, initial encounter: Secondary | ICD-10-CM

## 2016-03-26 DIAGNOSIS — F32A Depression, unspecified: Secondary | ICD-10-CM | POA: Diagnosis present

## 2016-03-26 DIAGNOSIS — E1165 Type 2 diabetes mellitus with hyperglycemia: Secondary | ICD-10-CM

## 2016-03-26 DIAGNOSIS — N390 Urinary tract infection, site not specified: Secondary | ICD-10-CM

## 2016-03-26 HISTORY — DX: Sepsis, unspecified organism: A41.9

## 2016-03-26 HISTORY — DX: Encephalopathy, unspecified: G93.40

## 2016-03-26 LAB — CBC
HEMATOCRIT: 34.3 % — AB (ref 36.0–46.0)
Hemoglobin: 11.6 g/dL — ABNORMAL LOW (ref 12.0–15.0)
MCH: 30.6 pg (ref 26.0–34.0)
MCHC: 33.8 g/dL (ref 30.0–36.0)
MCV: 90.5 fL (ref 78.0–100.0)
Platelets: 177 10*3/uL (ref 150–400)
RBC: 3.79 MIL/uL — AB (ref 3.87–5.11)
RDW: 14.2 % (ref 11.5–15.5)
WBC: 10.7 10*3/uL — ABNORMAL HIGH (ref 4.0–10.5)

## 2016-03-26 LAB — GLUCOSE, CAPILLARY
GLUCOSE-CAPILLARY: 180 mg/dL — AB (ref 65–99)
GLUCOSE-CAPILLARY: 239 mg/dL — AB (ref 65–99)
Glucose-Capillary: 225 mg/dL — ABNORMAL HIGH (ref 65–99)
Glucose-Capillary: 195 mg/dL — ABNORMAL HIGH (ref 65–99)

## 2016-03-26 LAB — BASIC METABOLIC PANEL
Anion gap: 12 (ref 5–15)
BUN: 14 mg/dL (ref 6–20)
CALCIUM: 8 mg/dL — AB (ref 8.9–10.3)
CO2: 26 mmol/L (ref 22–32)
CREATININE: 1.15 mg/dL — AB (ref 0.44–1.00)
Chloride: 91 mmol/L — ABNORMAL LOW (ref 101–111)
GFR calc non Af Amer: 47 mL/min — ABNORMAL LOW (ref >60)
GFR, EST AFRICAN AMERICAN: 55 mL/min — AB (ref >60)
Glucose, Bld: 193 mg/dL — ABNORMAL HIGH (ref 65–99)
Potassium: 3.4 mmol/L — ABNORMAL LOW (ref 3.5–5.1)
Sodium: 129 mmol/L — ABNORMAL LOW (ref 135–145)

## 2016-03-26 LAB — LACTIC ACID, PLASMA
LACTIC ACID, VENOUS: 0.9 mmol/L (ref 0.5–1.9)
LACTIC ACID, VENOUS: 0.8 mmol/L (ref 0.5–1.9)

## 2016-03-26 LAB — PROCALCITONIN: Procalcitonin: 2.89 ng/mL

## 2016-03-26 LAB — TROPONIN I: TROPONIN I: 0.05 ng/mL — AB (ref <0.03)

## 2016-03-26 MED ORDER — MECLIZINE HCL 12.5 MG PO TABS: 25.0000 mg | ORAL_TABLET | Freq: Three times a day (TID) | ORAL | Status: DC | PRN

## 2016-03-26 MED ORDER — DEXTROSE 5 % IV SOLN
2.0000 g | INTRAVENOUS | Status: DC
  Administered 2016-03-26 – 2016-03-28 (×3): 2 g via INTRAVENOUS
  Filled 2016-03-26 (×3): qty 2

## 2016-03-26 MED ORDER — ATORVASTATIN CALCIUM 40 MG PO TABS
40.0000 mg | ORAL_TABLET | Freq: Every day | ORAL | Status: DC
  Administered 2016-03-26 – 2016-03-28 (×3): 40 mg via ORAL
  Filled 2016-03-26 (×3): qty 1

## 2016-03-26 MED ORDER — ACETAMINOPHEN 325 MG PO TABS
650.0000 mg | ORAL_TABLET | Freq: Four times a day (QID) | ORAL | Status: DC | PRN
  Administered 2016-03-26: 650 mg via ORAL
  Filled 2016-03-26: qty 2

## 2016-03-26 MED ORDER — MAGNESIUM OXIDE 400 (241.3 MG) MG PO TABS
400.0000 mg | ORAL_TABLET | Freq: Every day | ORAL | Status: DC
  Administered 2016-03-26 – 2016-03-28 (×3): 400 mg via ORAL
  Filled 2016-03-26 (×3): qty 1

## 2016-03-26 MED ORDER — CALCIUM CARBONATE-VITAMIN D 500-200 MG-UNIT PO TABS
1.0000 | ORAL_TABLET | Freq: Every day | ORAL | Status: DC
  Administered 2016-03-26 – 2016-03-28 (×3): 1 via ORAL
  Filled 2016-03-26 (×3): qty 1

## 2016-03-26 MED ORDER — INSULIN ASPART 100 UNIT/ML ~~LOC~~ SOLN: 10.0000 [IU] | Freq: Once | SUBCUTANEOUS | Status: DC

## 2016-03-26 MED ORDER — PANTOPRAZOLE SODIUM 40 MG PO TBEC: 40.0000 mg | DELAYED_RELEASE_TABLET | Freq: Every day | ORAL | Status: DC

## 2016-03-26 MED ORDER — LOSARTAN POTASSIUM 50 MG PO TABS
50.0000 mg | ORAL_TABLET | Freq: Every day | ORAL | Status: DC
  Administered 2016-03-26 – 2016-03-28 (×3): 50 mg via ORAL
  Filled 2016-03-26 (×3): qty 1

## 2016-03-26 MED ORDER — INSULIN ASPART 100 UNIT/ML ~~LOC~~ SOLN
0.0000 [IU] | Freq: Every day | SUBCUTANEOUS | Status: DC
  Administered 2016-03-27: 2 [IU] via SUBCUTANEOUS

## 2016-03-26 MED ORDER — ALPRAZOLAM 0.5 MG PO TABS
1.0000 mg | ORAL_TABLET | Freq: Two times a day (BID) | ORAL | Status: DC | PRN
  Administered 2016-03-27: 1 mg via ORAL
  Filled 2016-03-26: qty 2

## 2016-03-26 MED ORDER — GABAPENTIN 300 MG PO CAPS
300.0000 mg | ORAL_CAPSULE | Freq: Two times a day (BID) | ORAL | Status: DC
  Administered 2016-03-26 – 2016-03-28 (×5): 300 mg via ORAL
  Filled 2016-03-26 (×5): qty 1

## 2016-03-26 MED ORDER — ALBUTEROL SULFATE (2.5 MG/3ML) 0.083% IN NEBU: 2.5000 mg | INHALATION_SOLUTION | RESPIRATORY_TRACT | Status: DC | PRN

## 2016-03-26 MED ORDER — ADULT MULTIVITAMIN W/MINERALS CH
1.0000 | ORAL_TABLET | Freq: Every day | ORAL | Status: DC
  Administered 2016-03-26 – 2016-03-28 (×3): 1 via ORAL
  Filled 2016-03-26 (×3): qty 1

## 2016-03-26 MED ORDER — AMLODIPINE BESYLATE 5 MG PO TABS
5.0000 mg | ORAL_TABLET | Freq: Every day | ORAL | Status: DC
  Administered 2016-03-26 – 2016-03-28 (×3): 5 mg via ORAL
  Filled 2016-03-26 (×3): qty 1

## 2016-03-26 MED ORDER — ONDANSETRON HCL 4 MG/2ML IJ SOLN: 4.0000 mg | Freq: Four times a day (QID) | INTRAMUSCULAR | Status: DC | PRN

## 2016-03-26 MED ORDER — ASPIRIN EC 81 MG PO TBEC
81.0000 mg | DELAYED_RELEASE_TABLET | Freq: Every day | ORAL | Status: DC
  Administered 2016-03-26 – 2016-03-28 (×3): 81 mg via ORAL
  Filled 2016-03-26 (×3): qty 1

## 2016-03-26 MED ORDER — IPRATROPIUM BROMIDE 0.02 % IN SOLN: 0.5000 mg | RESPIRATORY_TRACT | Status: DC | PRN

## 2016-03-26 MED ORDER — VENLAFAXINE HCL ER 75 MG PO CP24
150.0000 mg | ORAL_CAPSULE | Freq: Every day | ORAL | Status: DC
  Administered 2016-03-26 – 2016-03-28 (×3): 150 mg via ORAL
  Filled 2016-03-26 (×3): qty 2

## 2016-03-26 MED ORDER — SODIUM CHLORIDE 0.9 % IV SOLN
Freq: Once | INTRAVENOUS | Status: AC
  Administered 2016-03-26: 04:00:00 via INTRAVENOUS

## 2016-03-26 MED ORDER — METOPROLOL SUCCINATE ER 25 MG PO TB24
50.0000 mg | ORAL_TABLET | Freq: Every day | ORAL | Status: DC
  Administered 2016-03-26 – 2016-03-28 (×3): 50 mg via ORAL
  Filled 2016-03-26 (×3): qty 2

## 2016-03-26 MED ORDER — INSULIN ASPART 100 UNIT/ML ~~LOC~~ SOLN
0.0000 [IU] | Freq: Three times a day (TID) | SUBCUTANEOUS | Status: DC
  Administered 2016-03-26: 3 [IU] via SUBCUTANEOUS
  Administered 2016-03-26 (×2): 5 [IU] via SUBCUTANEOUS
  Administered 2016-03-27: 3 [IU] via SUBCUTANEOUS
  Administered 2016-03-27: 5 [IU] via SUBCUTANEOUS
  Administered 2016-03-27: 2 [IU] via SUBCUTANEOUS
  Administered 2016-03-28 (×2): 3 [IU] via SUBCUTANEOUS

## 2016-03-26 MED ORDER — ACETAMINOPHEN 650 MG RE SUPP: 650.0000 mg | Freq: Four times a day (QID) | RECTAL | Status: DC | PRN

## 2016-03-26 MED ORDER — ENOXAPARIN SODIUM 40 MG/0.4ML ~~LOC~~ SOLN
40.0000 mg | SUBCUTANEOUS | Status: DC
  Administered 2016-03-26 – 2016-03-28 (×3): 40 mg via SUBCUTANEOUS
  Filled 2016-03-26 (×3): qty 0.4

## 2016-03-26 MED ORDER — PANTOPRAZOLE SODIUM 40 MG PO TBEC
40.0000 mg | DELAYED_RELEASE_TABLET | Freq: Every day | ORAL | Status: DC
  Administered 2016-03-26 – 2016-03-27 (×2): 40 mg via ORAL
  Filled 2016-03-26 (×2): qty 1

## 2016-03-26 MED ORDER — ESCITALOPRAM OXALATE 10 MG PO TABS: 20.0000 mg | ORAL_TABLET | Freq: Every day | ORAL | Status: DC

## 2016-03-26 MED ORDER — SODIUM CHLORIDE 0.9 % IV SOLN: INTRAVENOUS | Status: DC

## 2016-03-26 MED ORDER — ONDANSETRON HCL 4 MG PO TABS: 4.0000 mg | ORAL_TABLET | Freq: Four times a day (QID) | ORAL | Status: DC | PRN

## 2016-03-26 MED ORDER — SODIUM CHLORIDE 0.9% FLUSH
3.0000 mL | Freq: Two times a day (BID) | INTRAVENOUS | Status: DC
  Administered 2016-03-26 – 2016-03-28 (×4): 3 mL via INTRAVENOUS

## 2016-03-26 MED ORDER — DIPHENHYDRAMINE HCL 25 MG PO CAPS: 25.0000 mg | ORAL_CAPSULE | Freq: Four times a day (QID) | ORAL | Status: DC | PRN

## 2016-03-26 NOTE — H&P (Addendum)
History and Physical    Kimberly Lin WLS:[id number] DOB: 12-18-1945 DOA: 03/25/2016  Referring MD/NP/PA: Dr. Laverta Baltimore PCP: Nance Pear., NP  Patient coming from: [facility]  Chief Complaint: Falls   HPI: Kimberly Lin is a 71 y.o. female with medical history significant of  HTN, HLD, diabetes mellitus type 2, fibromyalgia, anxiety; who presents with complaints of falls. Patient appears alert and appears to be a fair historian at this time. Over the last 2 weeks she reports waxing and waning complaints of lower abdominal pain with dysuria. Symptoms progressively worsened in over last 4 days. Earlier last week she had flulike symptoms which she had been getting over those symptoms. Associated symptoms of intermittent cough, increased falls, cloudy headedness, lightheadedness with changes in position, and dizziness. She tried taking Antivert without any relief of symptoms. Yesterday she had multiple falls, which prompted her to come in for further evaluation. Denies having any significant trauma to her head or loss of consciousness. Patient complains of having generalized pain all over that she relates to a combination of her falls and fibromyalgia. Denies having any significant chest pain shortness of breath, or unilateral weakness. At baseline patient able to ambulate and complete all of her ADLs without assistance. However, in the last week patient reports needing to rely on her husband to assist her in getting up and moving around her home.  ED Course: Upon initial evaluation in the emergency department patient was seen have a temperature of 99.42F, respirations in the 30s, pulse of to 133, blood pressure and O2 saturations maintained. Initial lab work revealed WBC 10, hemoglobin 12.3, platelets 163, sodium 127, chloride 88, BUN 29, creatinine 1.48(baseline 0.69), glucose 292, lactic acid 2.29. Chest x-ray showed no acute signs of infiltrate. Urinalysis was eventually obtained and reveals signs of  acute infection. Blood cultures were drawn and patient was given Rocephin IV along with IV fluids with 500 mL of normal saline. There was question of need of MRI during initial evaluation.  Review of Systems: As per HPI otherwise 10 point review of systems negative.   Past Medical History:  Diagnosis Date  . Abdominal mass   . Abnormal nuclear stress test 07/05/2014  . Anxiety   . Arthritis    osteoarthritis  . Colitis 2014  . Depression   . Diabetes mellitus   . Fibromyalgia   . GERD (gastroesophageal reflux disease)   . Hiatal hernia   . History of chicken pox   . History of septic shock 12/2009  . Hyperlipidemia   . Hypertension   . Migraine   . OSA (obstructive sleep apnea) 03/12/2011  . Personal history of colonic polyps   . Spinal stenosis     Past Surgical History:  Procedure Laterality Date  . APPENDECTOMY  1983  . BREAST BIOPSY Right 2007, 2009  . CARDIAC CATHETERIZATION N/A 07/05/2014   Procedure: Left Heart Cath and Coronary Angiography;  Surgeon: Sherren Mocha, MD;  Location: [facility];  Service: Cardiovascular;  Laterality: N/A;  . COLONOSCOPY  2014   normal   . MEDIAL PARTIAL KNEE REPLACEMENT Bilateral 2006  . OVARIAN CYST REMOVAL  2008   Pt states she had a ?cyst removed ?ovaries  . POLYPECTOMY  1998     reports that she has quit smoking. Her smoking use included Cigarettes. She has never used smokeless tobacco. She reports that she drinks about 3.6 - 4.8 oz of alcohol per week . She reports that she does not use drugs.  Allergies  Allergen Reactions  . Erythromycin Swelling    "all mycins"  . Prednisone Other (See Comments)    "Insomnia" and "makes me crazy"    Family History  Problem Relation Age of Onset  . Arthritis Mother   . Hyperlipidemia Mother   . Hypertension Mother   . Diabetes Mother   . Heart disease Mother   . Breast cancer Mother   . Lung cancer Brother   . Heart disease Brother     x 2  . Hypertension Brother   .  Hyperlipidemia Brother   . Diabetes Brother   . Sudden death Father   . Lung cancer Father   . Rectal cancer Father   . Diabetes Sister   . Hyperlipidemia Sister   . Hypertension Sister   . Diabetes Maternal Aunt   . Heart attack Maternal Aunt   . Hyperlipidemia Maternal Aunt   . Hypertension Maternal Aunt   . Hypertension Maternal Uncle   . Hyperlipidemia Maternal Uncle   . Heart attack Maternal Uncle   . Diabetes Maternal Uncle   . Diabetes Paternal Aunt   . Heart attack Paternal Aunt   . Hyperlipidemia Paternal Aunt   . Hypertension Paternal Aunt   . Hypertension Paternal Uncle   . Hyperlipidemia Paternal Uncle   . Heart attack Paternal Uncle   . Diabetes Paternal Uncle   . Prostate cancer Brother     Prior to Admission medications   Medication Sig Start Date End Date Taking? Authorizing Provider  ALPRAZolam Duanne Moron) 1 MG tablet Take 1 tablet (1 mg total) by mouth 2 (two) times daily as needed for anxiety. 03/03/16   Debbrah Alar, NP  amLODipine (NORVASC) 5 MG tablet Take 1 tablet (5 mg total) by mouth daily. 09/14/15   Debbrah Alar, NP  aspirin 81 MG tablet Take 81 mg by mouth daily.      Historical Provider, MD  atorvastatin (LIPITOR) 40 MG tablet Take 1 tablet (40 mg total) by mouth daily. 09/14/15   Debbrah Alar, NP  betamethasone dipropionate (DIPROLENE) 0.05 % cream Apply topically 2 (two) times daily. 02/28/16   Debbrah Alar, NP  Calcium Carbonate-Vitamin D (CALCIUM-D) 600-400 MG-UNIT TABS Take 1 tablet by mouth daily.     Historical Provider, MD  Coenzyme Q10 (CO Q 10) 100 MG CAPS Take 100 mg by mouth daily.    Historical Provider, MD  diphenhydrAMINE (BENADRYL) 25 MG tablet Take 25 mg by mouth every 6 (six) hours as needed for itching.     Historical Provider, MD  escitalopram (LEXAPRO) 20 MG tablet Take 1 tablet (20 mg total) by mouth daily. 02/25/16   Debbrah Alar, NP  esomeprazole (NEXIUM) 40 MG capsule Take 1 capsule (40 mg total) by mouth  daily at 12 noon. 02/28/16   Debbrah Alar, NP  gabapentin (NEURONTIN) 300 MG capsule Take 1 capsule (300 mg total) by mouth 2 (two) times daily. Reported on 03/21/2015 09/03/15   Debbrah Alar, NP  glucose blood (ONE TOUCH ULTRA TEST) test strip Use as instructed to check blood sugar twice a day.  DX E11.65 10/29/15   Debbrah Alar, NP  Insulin Pen Needle 31G X 4 MM MISC Inject 1 each into the skin daily. Use to inject Victoza once a day. DX 11.65 10/19/15   Debbrah Alar, NP  Lancets (ONETOUCH ULTRASOFT) lancets Use as instructed to check blood sugar twice a day.  DX E11.65 10/29/15   Debbrah Alar, NP  liraglutide (VICTOZA) 18 MG/3ML SOPN Inject 1.33m once daily  02/25/16   Debbrah Alar, NP  losartan (COZAAR) 50 MG tablet Take 1 tablet (50 mg total) by mouth daily. 09/14/15   Debbrah Alar, NP  Magnesium Oxide 400 (240 Mg) MG TABS Take 1 tablet by mouth daily. 11/08/15   Debbrah Alar, NP  meclizine (ANTIVERT) 25 MG tablet Take 1 tablet (25 mg total) by mouth 3 (three) times daily as needed for dizziness. 01/01/16   Debbrah Alar, NP  metFORMIN (GLUCOPHAGE) 500 MG tablet TAKE TWO TABLETS BY MOUTH TWICE DAILY WITH MEALS 09/14/15   Debbrah Alar, NP  metoprolol succinate (TOPROL-XL) 50 MG 24 hr tablet Take 1 tablet (50 mg total) by mouth daily. Take with or immediately following a meal. 09/18/15   Debbrah Alar, NP  Multiple Vitamin (MULTIVITAMIN) tablet Take 1 tablet by mouth daily.      Historical Provider, MD  Omega-3 Fatty Acids (FISH OIL) 1000 MG CAPS 2 caps by mouth twice daily Patient taking differently: Take 2,000 mg by mouth 2 (two) times daily.  04/16/11   Debbrah Alar, NP  ondansetron (ZOFRAN ODT) 8 MG disintegrating tablet Take 1 tablet (8 mg total) by mouth every 8 (eight) hours as needed for nausea or vomiting. 12/19/14   Pattricia Boss, MD  ondansetron (ZOFRAN) 4 MG tablet Take 1 tablet (4 mg total) by mouth every 8 (eight) hours as needed for  nausea or vomiting. 01/01/16   Debbrah Alar, NP  pantoprazole (PROTONIX) 40 MG tablet Take 1 tablet (40 mg total) by mouth daily. 03/18/16   Debbrah Alar, NP  venlafaxine XR (EFFEXOR-XR) 75 MG 24 hr capsule TAKE TWO CAPSULES BY MOUTH ONCE DAILY 09/14/15   Debbrah Alar, NP  zolpidem (AMBIEN) 10 MG tablet Take 1 tablet (10 mg total) by mouth at bedtime as needed. for sleep 03/03/16   Debbrah Alar, NP    Physical Exam:    Constitutional: Obese elderly female in NAD, calm, comfortable Vitals:   03/25/16 2330 03/26/16 0000 03/26/16 0030 03/26/16 0200  BP: 126/59 144/80 145/66 (!) 146/85  Pulse:  (!) 127  (!) 102  Resp: (!) 31 (!) 43 (!) 28 (!) 22  Temp:    99.7 F (37.6 C)  TempSrc:    Oral  SpO2:  94% (!) 82% 95%  Weight:    89.6 kg (197 lb 8 oz)  Height:    5' 4"  (1.626 m)   Eyes: PERRL, lids and conjunctivae normal ENMT: Mucous membranes are dry. Posterior pharynx clear of any exudate or lesions.Normal dentition.  Neck: normal, supple, no masses, no thyromegaly Respiratory: clear to auscultation bilaterally, no wheezing, no crackles. Normal respiratory effort. No accessory muscle use.  Cardiovascular: Tachycardic, no murmurs / rubs / gallops. No extremity edema. 2+ pedal pulses. No carotid bruits.  Abdomen: lower abdominal tenderness present, no masses palpated. No hepatosplenomegaly. Bowel sounds positive.  Musculoskeletal: no clubbing / cyanosis. No joint deformity upper and lower extremities. Good ROM, no contractures. Normal muscle tone.  Skin: no rashes, lesions, ulcers. No induration Neurologic: CN 2-12 grossly intact. Sensation intact, DTR normal. Strength 5/5 in all 4.  Psychiatric: Normal judgment and insight. Alert and oriented x 3. Normal mood.     Labs on Admission: I have personally reviewed following labs and imaging studies  CBC:  Recent Labs Lab 03/25/16 1550  WBC 10.0  NEUTROABS 8.5*  HGB 12.3  HCT 36.6  MCV 91.5  PLT 765   Basic  Metabolic Panel:  Recent Labs Lab 03/25/16 1550  NA 127*  K 3.8  CL 88*  CO2 27  GLUCOSE 292*  BUN 29*  CREATININE 1.48*  CALCIUM 8.4*   GFR: Estimated Creatinine Clearance: 38.4 mL/min (by C-G formula based on SCr of 1.48 mg/dL (H)). Liver Function Tests:  Recent Labs Lab 03/25/16 1550  AST 65*  ALT 23  ALKPHOS 84  BILITOT 1.0  PROT 7.3  ALBUMIN 2.9*    Recent Labs Lab 03/25/16 1550  LIPASE 18   No results for input(s): AMMONIA in the last 168 hours. Coagulation Profile: No results for input(s): INR, PROTIME in the last 168 hours. Cardiac Enzymes:  Recent Labs Lab 03/25/16 1550  TROPONINI <0.03   BNP (last 3 results) No results for input(s): PROBNP in the last 8760 hours. HbA1C: No results for input(s): HGBA1C in the last 72 hours. CBG:  Recent Labs Lab 03/25/16 1515  GLUCAP 295*   Lipid Profile: No results for input(s): CHOL, HDL, LDLCALC, TRIG, CHOLHDL, LDLDIRECT in the last 72 hours. Thyroid Function Tests: No results for input(s): TSH, T4TOTAL, FREET4, T3FREE, THYROIDAB in the last 72 hours. Anemia Panel: No results for input(s): VITAMINB12, FOLATE, FERRITIN, TIBC, IRON, RETICCTPCT in the last 72 hours. Urine analysis:    Component Value Date/Time   COLORURINE YELLOW 03/25/2016 1739   APPEARANCEUR CLOUDY (A) 03/25/2016 1739   LABSPEC 1.014 03/25/2016 1739   PHURINE 6.0 03/25/2016 1739   GLUCOSEU >=500 (A) 03/25/2016 1739   HGBUR MODERATE (A) 03/25/2016 1739   BILIRUBINUR NEGATIVE 03/25/2016 1739   BILIRUBINUR negative 09/14/2015 1130   KETONESUR NEGATIVE 03/25/2016 1739   PROTEINUR 30 (A) 03/25/2016 1739   UROBILINOGEN negative 09/14/2015 1130   UROBILINOGEN 0.2 08/17/2013 1622   NITRITE POSITIVE (A) 03/25/2016 1739   LEUKOCYTESUR SMALL (A) 03/25/2016 1739   Sepsis Labs: No results found for this or any previous visit (from the past 240 hour(s)).   Radiological Exams on Admission: Dg Chest 2 View  Result Date:  03/25/2016 CLINICAL DATA:  Confusion and dizziness EXAM: CHEST  2 VIEW COMPARISON:  December 19, 2014 FINDINGS: There is no edema or consolidation. Heart is upper normal in size with pulmonary vascularity within normal limits. There is atherosclerotic calcification in the aorta. No adenopathy. There is evidence of an old fracture the lateral left clavicle with remodeling. IMPRESSION: No edema or consolidation.  Aortic atherosclerosis. Electronically Signed   By: Lowella Grip III M.D.   On: 03/25/2016 16:12   Ct Head Wo Contrast  Result Date: 03/25/2016 CLINICAL DATA:  Vertigo x2 weeks, fall, posterior head injury, confusion EXAM: CT HEAD WITHOUT CONTRAST TECHNIQUE: Contiguous axial images were obtained from the base of the skull through the vertex without intravenous contrast. COMPARISON:  MRI brain dated 11/13/2012 FINDINGS: Brain: No evidence of acute infarction, hemorrhage, hydrocephalus, extra-axial collection or mass lesion/mass effect. Global cortical atrophy. Subcortical white matter and periventricular small vessel ischemic changes. Vascular: No hyperdense vessel or unexpected calcification. Skull: Normal. Negative for fracture or focal lesion. Sinuses/Orbits: The visualized paranasal sinuses are essentially clear. The mastoid air cells are unopacified. Other: None. IMPRESSION: No evidence of acute intracranial abnormality. Atrophy with small vessel ischemic changes. Electronically Signed   By: Julian Hy M.D.   On: 03/25/2016 16:16    EKG: Independently reviewed. Sinus tachycardia 128 bpm  Assessment/Plan Sepsis secondary to UTI: Patient met SIRS criteria with positive urinalysis. - admit to a Telemetry bed - Sepsis protocol initiated - Follow-up blood and urine cultures - Continue Rocephin   Acute encephalopathy: Suspect likely multifactorial in nature considering underlying infection along  with signs of dehydration. - Neuro checks - Reassess in a.m. if patient warrants MRI to  rule out infectious cause  Frequent falls - PT to eval and treat in a.m.  Acute kidney injury: Patient presents with a creatinine of 1.48 and BUN 29. Baseline creatinine previously noted to be within normal limits. - Check renal ultrasound - Gentle IV fluids  - recheck BMP in a.m.   Essential hypertension - Continue amlodipine, losartan, and metoprolol   Anxiety/depression - Ativan prn anxiety and venlafaxine  Diabetes mellitus type 2 - Hypoglycemic protocols - Held metformin and Victoza -  Cbg qAC with moderate sliding scale insulin  Hyponatremia: Initial sodium noted to be 126. Patient if this could be contributing also to patient's acute confusion. - Gentle IV fluid hydration. - Follow-up repeat BMP this a.m.  Hyperlipidemia - Continue Lipitor  GERD - Continue Protonix  DVT prophylaxis: Lovenox Code Status:Full Family Communication:  Disposition Plan:  Likely discharge home once medically stable in 2-3 days Consults called: none  Admission status:  Inpatient   Norval Morton MD [facility] Pager [phone]  If 7PM-7AM, please contact night-coverage [email or web address] Password [facility]  03/26/2016, 3:28 AM

## 2016-03-26 NOTE — Progress Notes (Signed)
Pharmacy Antibiotic Note  Kimberly Lin is a 71 y.o. female admitted on 03/25/2016 with UTI with signs of sepsis.  Pharmacy has been consulted for Rocephin dosing.  Plan: Rocephin 2g IV Q24H (consider decreasing to 1g if signs of sepsis resolve).  Height: 5\' 4"  (162.6 cm) Weight: 197 lb 8 oz (89.6 kg) IBW/kg (Calculated) : 54.7  Temp (24hrs), Avg:98.8 F (37.1 C), Min:97.8 F (36.6 C), Max:99.7 F (37.6 C)   Recent Labs Lab 03/25/16 1550 03/25/16 1631 03/25/16 1937  WBC 10.0  --   --   CREATININE 1.48*  --   --   LATICACIDVEN  --  2.29* 1.89    Estimated Creatinine Clearance: 38.4 mL/min (by C-G formula based on SCr of 1.48 mg/dL (H)).    Allergies  Allergen Reactions  . Erythromycin Swelling    "all mycins"  . Prednisone Other (See Comments)    "Insomnia" and "makes me crazy"    Thank you for allowing pharmacy to be a part of this patient's care.  Wynona Neat, PharmD, BCPS  03/26/2016 3:45 AM

## 2016-03-26 NOTE — Progress Notes (Signed)
Pt returned from ultrasound

## 2016-03-26 NOTE — Progress Notes (Signed)
Received patient from [place] via stretcher.  VSS, c/o generalized body aches / pain.  Oriented to room, plan of care, safety precautions & care of valuables.

## 2016-03-26 NOTE — Progress Notes (Signed)
Pt leaving unit for Ultrasound.

## 2016-03-26 NOTE — Evaluation (Signed)
Physical Therapy Evaluation Patient Details Name: Kimberly Lin MRN: [id number] DOB: 1945/03/17 Today's Date: 03/26/2016   History of Present Illness  Kimberly Lin is a 71 y.o. female with medical history significant of  HTN, HLD, diabetes mellitus type 2, fibromyalgia, anxiety; who presents with complaints of falls.  Clinical Impression  Patient presents with decreased independence with mobility due to deficits listed in PT problem list.  She will benefit from skilled PT in the acute setting to allow return home with follow up HHPT.     Follow Up Recommendations Home health PT    Equipment Recommendations  None recommended by PT    Recommendations for Other Services       Precautions / Restrictions Precautions Precautions: Fall Precaution Comments: recent history of falls related to this illness      Mobility  Bed Mobility Overal bed mobility: Needs Assistance Bed Mobility: Supine to Sit     Supine to sit: Min guard     General bed mobility comments: RN assisting pt up to [facility] when I entered  Transfers Overall transfer level: Needs assistance Equipment used: Rolling walker (2 wheeled) Transfers: Sit to/from Stand Sit to Stand: Min guard         General transfer comment: assist for safety, balance  Ambulation/Gait Ambulation/Gait assistance: Min guard Ambulation Distance (Feet): 120 Feet Assistive device: Rolling walker (2 wheeled) Gait Pattern/deviations: Step-through pattern;Decreased stride length;Trunk flexed     General Gait Details: slow pace, assist for balance, safety, reliant on RW for balance  Stairs            Wheelchair Mobility    Modified Rankin (Stroke Patients Only)       Balance Overall balance assessment: Needs assistance   Sitting balance-Leahy Scale: Good     Standing balance support: Bilateral upper extremity supported Standing balance-Leahy Scale: Poor Standing balance comment: UE support for balance                             Pertinent Vitals/Pain Pain Assessment: Faces Faces Pain Scale: Hurts little more Pain Location: generalized Pain Descriptors / Indicators: Aching Pain Intervention(s): Monitored during session;Repositioned    Home Living Family/patient expects to be discharged to:: Private residence Living Arrangements: Spouse/significant other Available Help at Discharge: Family Type of Home: House Home Access: Stairs to enter Entrance Stairs-Rails: Right Entrance Stairs-Number of Steps: 2 Home Layout: One level Home Equipment: Environmental consultant - 2 wheels (equipment from taking care of her mother)      Prior Function Level of Independence: Independent         Comments: indep until 2 weeks ago, spouse is disabled veteran and she drives him and does home management.  Since falling he has had to help her     Hand Dominance        Extremity/Trunk Assessment        Lower Extremity Assessment Lower Extremity Assessment: Overall WFL for tasks assessed    Cervical / Trunk Assessment Cervical / Trunk Assessment: Kyphotic  Communication   Communication: No difficulties  Cognition Arousal/Alertness: Awake/alert Behavior During Therapy: WFL for tasks assessed/performed Overall Cognitive Status: Impaired/Different from baseline Area of Impairment: Memory     Memory: Decreased short-term memory         General Comments: reports feeling fuzzy at home and difficulty recalling things and even following spouse's directions not to get up without his help    General Comments General comments (skin integrity,  edema, etc.): sat on [facility] about 10 minutes prior to being able to urinate    Exercises     Assessment/Plan    PT Assessment Patient needs continued PT services  PT Problem List Decreased strength;Decreased mobility;Decreased balance;Decreased knowledge of use of DME;Decreased safety awareness;Decreased activity tolerance          PT Treatment Interventions  DME instruction;Therapeutic activities;Therapeutic exercise;Patient/family education;Gait training;Functional mobility training;Stair training;Balance training    PT Goals (Current goals can be found in the Care Plan section)  Acute Rehab PT Goals Patient Stated Goal: To return to independent PT Goal Formulation: With patient Time For Goal Achievement: 04/02/16 Potential to Achieve Goals: Good    Frequency Min 3X/week   Barriers to discharge        Co-evaluation               End of Session Equipment Utilized During Treatment: Gait belt Activity Tolerance: Patient tolerated treatment well Patient left: in chair;with call bell/phone within reach;with chair alarm set           Time: 1035-1059 PT Time Calculation (min) (ACUTE ONLY): 24 min   Charges:   PT Evaluation $PT Eval Moderate Complexity: 1 Procedure PT Treatments $Gait Training: 8-22 mins   PT G CodesReginia Lin 11-Apr-2016, 12:46 PM  Kimberly Lin, [place] April 11, 2016

## 2016-03-26 NOTE — Progress Notes (Signed)
Patient ID: Dayzha B Mineer, female   DOB: 09/18/1945, 71 y.o.   MRN: [id number]  Patient admitted after midnight. For details please refer to admission note done 03/26/2016.  71 y.o. female with medical history significant for hypertension, dyslipidemia, diabetes mellitus, anxiety, fibromyalgia who presented to ED with altered mental status, status post multiple falls. Patient reported multiple falls over past 2 weeks, weakness, abdominal pain and dysuria. Her symptoms have progressively worsened over past 4 days prior to this admission.  In ED, blood pressure was 126/59, heart rate 133, respiratory rate 17-43, oxygen saturation 82% on room air which has improved to 96% with nasal cannula oxygen support. Tmax was 99.7F. Blood work was notable for glucose of 292, troponin within normal limits, sodium 127, creatinine 1.48. White blood cell count was 10.7, hemoglobin 11.6. Alcohol level was within normal limits. Urinalysis showed small leukocytes and many bacteria. She was started on empiric Rocephin while awaiting urine culture results.  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 

## 2016-03-27 ENCOUNTER — Encounter: Payer: Self-pay | Admitting: Family

## 2016-03-27 DIAGNOSIS — G9340 Encephalopathy, unspecified: Secondary | ICD-10-CM

## 2016-03-27 DIAGNOSIS — E871 Hypo-osmolality and hyponatremia: Secondary | ICD-10-CM

## 2016-03-27 DIAGNOSIS — F3289 Other specified depressive episodes: Secondary | ICD-10-CM

## 2016-03-27 DIAGNOSIS — E876 Hypokalemia: Secondary | ICD-10-CM

## 2016-03-27 LAB — CBC
HCT: 35.8 % — ABNORMAL LOW (ref 36.0–46.0)
Hemoglobin: 12.2 g/dL (ref 12.0–15.0)
MCH: 30.9 pg (ref 26.0–34.0)
MCHC: 34.1 g/dL (ref 30.0–36.0)
MCV: 90.6 fL (ref 78.0–100.0)
PLATELETS: 246 10*3/uL (ref 150–400)
RBC: 3.95 MIL/uL (ref 3.87–5.11)
RDW: 14.1 % (ref 11.5–15.5)
WBC: 9.7 10*3/uL (ref 4.0–10.5)

## 2016-03-27 LAB — BASIC METABOLIC PANEL
Anion gap: 13 (ref 5–15)
BUN: 13 mg/dL (ref 6–20)
CO2: 23 mmol/L (ref 22–32)
CREATININE: 1.03 mg/dL — AB (ref 0.44–1.00)
Calcium: 8.2 mg/dL — ABNORMAL LOW (ref 8.9–10.3)
Chloride: 95 mmol/L — ABNORMAL LOW (ref 101–111)
GFR, EST NON AFRICAN AMERICAN: 54 mL/min — AB (ref >60)
Glucose, Bld: 271 mg/dL — ABNORMAL HIGH (ref 65–99)
POTASSIUM: 3.4 mmol/L — AB (ref 3.5–5.1)
Sodium: 131 mmol/L — ABNORMAL LOW (ref 135–145)

## 2016-03-27 LAB — GLUCOSE, CAPILLARY
GLUCOSE-CAPILLARY: 173 mg/dL — AB (ref 65–99)
GLUCOSE-CAPILLARY: 202 mg/dL — AB (ref 65–99)
Glucose-Capillary: 149 mg/dL — ABNORMAL HIGH (ref 65–99)
Glucose-Capillary: 234 mg/dL — ABNORMAL HIGH (ref 65–99)

## 2016-03-27 MED ORDER — LIRAGLUTIDE 18 MG/3ML ~~LOC~~ SOPN: 1.8000 mg | PEN_INJECTOR | Freq: Every day | SUBCUTANEOUS | Status: DC

## 2016-03-27 NOTE — Progress Notes (Addendum)
Patient ID: Kimberly Lin, female   DOB: [birth year], 71 y.o.   MRN: [id number]   PROGRESS NOTE    Kimberly Lin  XTK:[id number] DOB: 1945-09-06 DOA: 03/25/2016  PCP: Nance Pear., NP   Brief Narrative:  71 y.o. female with medical history significant for hypertension, dyslipidemia, diabetes mellitus, anxiety, fibromyalgia who presented to ED with altered mental status, status post multiple falls. Patient reported multiple falls over past 2 weeks, weakness, abdominal pain and dysuria. Her symptoms have progressively worsened over past 4 days prior to this admission.  In ED, blood pressure was 126/59, heart rate 133, respiratory rate 17-43, oxygen saturation 82% on room air which has improved to 96% with nasal cannula oxygen support. Tmax was 99.79F. Blood work was notable for glucose of 292, troponin within normal limits, sodium 127, creatinine 1.48. White blood cell count was 10.7, hemoglobin 11.6. Alcohol level was within normal limits. Urinalysis showed small leukocytes and many bacteria. She was started on empiric Rocephin while awaiting urine culture results.   Assessment & Plan:  Sepsis secondary to UTI / leukocytosis - Sepsis criteria met on the admission with low-grade fever, tachycardia, tachypnea, hypoxia, leukocytosis - Evidence of urinary tract infection on urinalysis with leukocytes and many bacteria - Continue Rocephin - Urine cx with multiple species none predominant; will collect another urine cx sample today   Mild troponin elevation - Troponin 0.0513 admission but the repeat value within normal limits - Slight elevation likely demand ischemia from chronic kidney disease as well as sepsis - No reports of chest pain  Hyponatremia  - Likely from sepsis  - Repeat sodium level today   Hypokalemia - Potassium 3.4 on 03/26/2016 - Repeat BMP today  Acute renal failure superimposed on chronic kidney disease stage III - Baseline creatinine 1.13 in October  2016 - Creatinine above baseline range, likely reflective of sepsis - Cr improved with IV fluids, it is 1.15   Anemia of chronic kidney disease - Hemoglobin stable  Controlled diabetes mellitus with diabetic nephropathy with long term insulin use  - A1c 6 months ago was 7 indicating good glycemic control  - Continue SSI - CBG's in past 24 hours: 239, 195, 149 - Resume liraglutide - Metformin on hold considering her Cr was above baseline range on admission)  - Continue gabapentin for diabetic neuropathy  Dyslipidemia associated with type 2 diabetes mellitus - Continue Lipitor 40 mg daily  Essential hypertension - Continue Norvasc and metoprolol - Continue losartan  Depression - Continue Effexor 150 mg daily     DVT prophylaxis: Lovenox subQ Code Status: full code  Family Communication: no family at the bedside this am Disposition Plan: home versus SNF, she is very disoriented and not sure how will she be at home; also needs repeat labs this am   Consultants:   PT  Procedures:   None  Antimicrobials:   Rocephin 03/25/2016 -->   Subjective: No overnight events.  Objective: Vitals:   03/26/16 2038 03/27/16 0117 03/27/16 0500 03/27/16 0902  BP: 133/72 125/83 134/75 (!) 149/82  Pulse: (!) 105 82 81 (!) 106  Resp: _0 Temp: 99.2 F (37.3 C) 98.1 F (36.7 C) 98 F (36.7 C) 98.6 F (37 C)  TempSrc: Oral Oral Oral Oral  SpO2: 94% 94% 95% 95%  Weight:      Height:        Intake/Output Summary (Last 24 hours) at 03/27/16 0933 Last data filed at 03/27/16 0831  Gross per 24 hour  Intake              410 ml  Output                0 ml  Net              410 ml   Filed Weights   03/25/16 1510 03/26/16 0200  Weight: 90.3 kg (199 lb) 89.6 kg (197 lb 8 oz)    Examination:  General exam: Appears calm and comfortable  Respiratory system: Clear to auscultation. Respiratory effort normal. Cardiovascular system: S1 & S2 heard, Rate controlled   Gastrointestinal system: Abdomen is nondistended, soft and nontender. No organomegaly or masses felt. Normal bowel sounds heard. Central nervous system: disoriented, talks about random events in past with no connection, does not answer questions asked  Extremities: Symmetric 5 x 5 power. Skin: No rashes, lesions or ulcers Psychiatry: she is not agitated but she is totally disoriented   Data Reviewed: I have personally reviewed following labs and imaging studies  CBC:  Recent Labs Lab 03/25/16 1550 03/26/16 0459  WBC 10.0 10.7*  NEUTROABS 8.5*  --   HGB 12.3 11.6*  HCT 36.6 34.3*  MCV 91.5 90.5  PLT 163 716   Basic Metabolic Panel:  Recent Labs Lab 03/25/16 1550 03/26/16 0459  NA 127* 129*  K 3.8 3.4*  CL 88* 91*  CO2 27 26  GLUCOSE 292* 193*  BUN 29* 14  CREATININE 1.48* 1.15*  CALCIUM 8.4* 8.0*   GFR: Estimated Creatinine Clearance: 49.4 mL/min (by C-G formula based on SCr of 1.15 mg/dL (H)). Liver Function Tests:  Recent Labs Lab 03/25/16 1550  AST 65*  ALT 23  ALKPHOS 84  BILITOT 1.0  PROT 7.3  ALBUMIN 2.9*    Recent Labs Lab 03/25/16 1550  LIPASE 18   No results for input(s): AMMONIA in the last 168 hours. Coagulation Profile: No results for input(s): INR, PROTIME in the last 168 hours. Cardiac Enzymes:  Recent Labs Lab 03/25/16 1550 03/26/16 0459 03/26/16 1018  TROPONINI <0.03 0.05* <0.03   BNP (last 3 results) No results for input(s): PROBNP in the last 8760 hours. HbA1C: No results for input(s): HGBA1C in the last 72 hours. CBG:  Recent Labs Lab 03/26/16 0504 03/26/16 1217 03/26/16 1721 03/26/16 2219 03/27/16 0649  GLUCAP 180* 225* 239* 195* 149*   Lipid Profile: No results for input(s): CHOL, HDL, LDLCALC, TRIG, CHOLHDL, LDLDIRECT in the last 72 hours. Thyroid Function Tests: No results for input(s): TSH, T4TOTAL, FREET4, T3FREE, THYROIDAB in the last 72 hours. Anemia Panel: No results for input(s): VITAMINB12, FOLATE,  FERRITIN, TIBC, IRON, RETICCTPCT in the last 72 hours. Urine analysis:    Component Value Date/Time   COLORURINE YELLOW 03/25/2016 1739   APPEARANCEUR CLOUDY (A) 03/25/2016 1739   LABSPEC 1.014 03/25/2016 1739   PHURINE 6.0 03/25/2016 1739   GLUCOSEU >=500 (A) 03/25/2016 1739   HGBUR MODERATE (A) 03/25/2016 1739   BILIRUBINUR NEGATIVE 03/25/2016 1739   BILIRUBINUR negative 09/14/2015 1130   KETONESUR NEGATIVE 03/25/2016 1739   PROTEINUR 30 (A) 03/25/2016 1739   UROBILINOGEN negative 09/14/2015 1130   UROBILINOGEN 0.2 08/17/2013 1622   NITRITE POSITIVE (A) 03/25/2016 1739   LEUKOCYTESUR SMALL (A) 03/25/2016 1739   Sepsis Labs: _0 (procalcitonin:4,lacticidven:4)   Results for orders placed or performed in visit on 01/16/16  Urine culture     Status: None   Collection Time: 01/16/16 11:26 AM  Result Value Ref Range Status   Organism ID, Bacteria  Final    Multiple organisms present,each less than 10,000 CFU/mL. These organisms,commonly found on external and internal genitalia,are considered colonizers. No further testing performed.      Radiology Studies: Dg Chest 2 View Result Date: 03/25/2016 No edema or consolidation.  Aortic atherosclerosis. Electronically Signed   By: Lowella Grip III M.D.   On: 03/25/2016 16:12   Ct Head Wo Contrast Result Date: 03/25/2016 No evidence of acute intracranial abnormality. Atrophy with small vessel ischemic changes. Electronically Signed   By: Julian Hy M.D.   On: 03/25/2016 16:16   US Renal Result Date: 03/26/2016 No acute findings.  No hydronephrosis. Electronically Signed   By: Rolm Baptise M.D.   On: 03/26/2016 11:48      Scheduled Meds: . amLODipine  5 mg Oral Daily  . aspirin EC  81 mg Oral Daily  . atorvastatin  40 mg Oral Daily  . calcium-vitamin D  1 tablet Oral Daily  . cefTRIAXone (ROCEPHIN)  IV  2 g Intravenous Q24H  . enoxaparin (LOVENOX) injection  40 mg Subcutaneous Q24H  . gabapentin  300 mg  Oral BID  . insulin aspart  0-15 Units Subcutaneous TID WC  . insulin aspart  0-5 Units Subcutaneous QHS  . losartan  50 mg Oral Daily  . magnesium oxide  400 mg Oral Daily  . metoprolol succinate  50 mg Oral Daily  . multivitamin with minerals  1 tablet Oral Daily  . pantoprazole  40 mg Oral QHS  . sodium chloride flush  3 mL Intravenous Q12H  . venlafaxine XR  150 mg Oral Q breakfast   Continuous Infusions:   LOS: 2 days    Time spent: 25 minutes  Greater than 50% of the time spent on counseling and coordinating the care.   Leisa Lenz, MD [facility] Pager [phone]  If 7PM-7AM, please contact night-coverage [email or web address] Password [facility] 03/27/2016, 9:33 AM

## 2016-03-27 NOTE — Telephone Encounter (Signed)
error:[id number] ° °

## 2016-03-27 NOTE — Progress Notes (Signed)
Physical Therapy Treatment Patient Details Name: Kimberly Lin MRN: [id number] DOB: [birth year] Today's Date: 03/27/2016    History of Present Illness Kimberly Lin is a 71 y.o. female with medical history significant of  HTN, HLD, diabetes mellitus type 2, fibromyalgia, anxiety; who presents with complaints of falls.    PT Comments    Patient continues to demonstrate balance/strength deficits and will benefit from further skilled PT services to maximize independence and safety. Pt will also need supervision/assist for OOB mobility upon d/c which husband will be able to provide. Continue to progress as tolerated with anticipated d/c home with HHPT.   Follow Up Recommendations  Home health PT;Supervision/Assistance - 24 hour     Equipment Recommendations  None recommended by PT    Recommendations for Other Services       Precautions / Restrictions Precautions Precautions: Fall Precaution Comments: recent history of falls related to this illness Restrictions Weight Bearing Restrictions: No    Mobility  Bed Mobility Overal bed mobility: Needs Assistance Bed Mobility: Supine to Sit     Supine to sit: Min guard     General bed mobility comments: cues to stay task focused and to scoot hips to EOB as pt had posterior lean upon sitting initally but improved with feet on floor; min guard for safety; use of rails   Transfers Overall transfer level: Needs assistance Equipment used: Rolling walker (2 wheeled) Transfers: Sit to/from Stand Sit to Stand: Min guard;Min assist         General transfer comment: from EOB X2 and [facility] from [facility]; first trial with LOB posteriorly adn assist to descend safely back to EOB; cues for hand placement and safe use of AD when going to sit down  Ambulation/Gait Ambulation/Gait assistance: Min guard Ambulation Distance (Feet): 150 Feet Assistive device: Rolling walker (2 wheeled) Gait Pattern/deviations: Step-through pattern;Decreased stride  length;Drifts right/left     General Gait Details: pt required min guard assist for balance especially with turns/directional changes; reliant on RW for balance   Stairs            Wheelchair Mobility    Modified Rankin (Stroke Patients Only)       Balance     Sitting balance-Leahy Scale: Good       Standing balance-Leahy Scale: Poor Standing balance comment: unable to perform tandem stance with UE support                    Cognition Arousal/Alertness: Awake/alert Behavior During Therapy: WFL for tasks assessed/performed Overall Cognitive Status: Impaired/Different from baseline Area of Impairment: Memory     Memory: Decreased short-term memory              Exercises General Exercises - Lower Extremity Hip Flexion/Marching: AROM;Both;10 reps Mini-Sqauts: AROM;10 reps    General Comments General comments (skin integrity, edema, etc.): husband present      Pertinent Vitals/Pain Pain Assessment: No/denies pain    Home Living                      Prior Function            PT Goals (current goals can now be found in the care plan section) Acute Rehab PT Goals Patient Stated Goal: To return to independent Progress towards PT goals: Progressing toward goals    Frequency    Min 3X/week      PT Plan Current plan remains appropriate    Co-evaluation  End of Session Equipment Utilized During Treatment: Gait belt Activity Tolerance: Patient tolerated treatment well Patient left: in chair;with call bell/phone within reach;with chair alarm set;with family/visitor present     Time: [id number] PT Time Calculation (min) (ACUTE ONLY): 37 min  Charges:  $Gait Training: 8-22 mins $Therapeutic Exercise: 8-22 mins                    G Codes:      Salina April, PTA Pager: [phone]   03/27/2016, 4:34 PM

## 2016-03-27 NOTE — Care Management Note (Signed)
Case Management Note  Patient Details  Name: Kimberly Lin MRN: [id number] Date of Birth: 03-20-1945  Subjective/Objective:                    Action/Plan: Pt discharging home with orders for [facility] services. CM provided the patient a list of [place] agencies. She selected [facility] Carolyn with [facility] notified and accepted the referral. No DME needs. Pt has transportation home.   Expected Discharge Date:  03/28/16               Expected Discharge Plan:  [place]  In-House Referral:     Discharge planning Services  CM Consult  Post Acute Care Choice:  Home Health Choice offered to:     DME Arranged:    DME Agency:     HH Arranged:  RN, PT, OT, Nurse's Aide [place] Agency:  [place]  Status of Service:  Completed, signed off  If discussed at [place] of Stay Meetings, dates discussed:    Additional Comments:  Pollie Friar, RN 03/27/2016, 11:50 AM

## 2016-03-28 ENCOUNTER — Telehealth: Payer: Self-pay | Admitting: Family

## 2016-03-28 DIAGNOSIS — F418 Other specified anxiety disorders: Secondary | ICD-10-CM

## 2016-03-28 LAB — URINE CULTURE: CULTURE: NO GROWTH

## 2016-03-28 LAB — BASIC METABOLIC PANEL
Anion gap: 11 (ref 5–15)
BUN: 14 mg/dL (ref 6–20)
CALCIUM: 8.5 mg/dL — AB (ref 8.9–10.3)
CO2: 28 mmol/L (ref 22–32)
CREATININE: 1.07 mg/dL — AB (ref 0.44–1.00)
Chloride: 99 mmol/L — ABNORMAL LOW (ref 101–111)
GFR calc Af Amer: 60 mL/min — ABNORMAL LOW (ref >60)
GFR, EST NON AFRICAN AMERICAN: 51 mL/min — AB (ref >60)
GLUCOSE: 163 mg/dL — AB (ref 65–99)
POTASSIUM: 3.3 mmol/L — AB (ref 3.5–5.1)
SODIUM: 138 mmol/L (ref 135–145)

## 2016-03-28 LAB — CBC
HCT: 35.8 % — ABNORMAL LOW (ref 36.0–46.0)
Hemoglobin: 11.9 g/dL — ABNORMAL LOW (ref 12.0–15.0)
MCH: 30.6 pg (ref 26.0–34.0)
MCHC: 33.2 g/dL (ref 30.0–36.0)
MCV: 92 fL (ref 78.0–100.0)
PLATELETS: 245 10*3/uL (ref 150–400)
RBC: 3.89 MIL/uL (ref 3.87–5.11)
RDW: 14.5 % (ref 11.5–15.5)
WBC: 8.7 10*3/uL (ref 4.0–10.5)

## 2016-03-28 LAB — GLUCOSE, CAPILLARY
GLUCOSE-CAPILLARY: 172 mg/dL — AB (ref 65–99)
Glucose-Capillary: 166 mg/dL — ABNORMAL HIGH (ref 65–99)

## 2016-03-28 MED ORDER — CIPROFLOXACIN HCL 500 MG PO TABS: 500.0000 mg | ORAL_TABLET | Freq: Two times a day (BID) | ORAL | 0 refills | Status: DC

## 2016-03-28 MED ORDER — POTASSIUM CHLORIDE CRYS ER 20 MEQ PO TBCR
40.0000 meq | EXTENDED_RELEASE_TABLET | Freq: Once | ORAL | Status: AC
  Administered 2016-03-28: 40 meq via ORAL

## 2016-03-28 NOTE — Progress Notes (Addendum)
Physical Therapy Treatment Patient Details Name: Kimberly Lin MRN: [id number] DOB: [birth year] Today's Date: 03/28/2016    History of Present Illness Kimberly Lin is a 71 y.o. female with medical history significant of  HTN, HLD, diabetes mellitus type 2, fibromyalgia, anxiety; who presents with complaints of falls.    PT Comments    Pt increasing independence with functional mobility this session, requiring supervision for gait. Continues to require safety cues for appropriate BUE placement during transfers. Pt states her husband will be able to assist her at home as needed. Pt reports no stairs to enter home, so stair training not necessary. Continuing to recommend the follow up recommendations below. Will continue to follow.   Follow Up Recommendations  Home health PT;Supervision/Assistance - 24 hour     Equipment Recommendations  None recommended by PT    Recommendations for Other Services       Precautions / Restrictions Precautions Precautions: Fall Precaution Comments: recent history of falls related to this illness Restrictions Weight Bearing Restrictions: No    Mobility  Bed Mobility Overal bed mobility: Needs Assistance Bed Mobility: Supine to Sit     Supine to sit: Supervision     General bed mobility comments: Supervision for cues for log roll technique for increased independence with bed mobility.   Transfers Overall transfer level: Needs assistance Equipment used: Rolling walker (2 wheeled) Transfers: Sit to/from Stand Sit to Stand: Supervision         General transfer comment: Supervision provided for safety. Verbal cues for appropriate placement of BUE during transfer. Only 1 attempt required for transfer this session.   Ambulation/Gait Ambulation/Gait assistance: Supervision Ambulation Distance (Feet): 150 Feet Assistive device: Rolling walker (2 wheeled) Gait Pattern/deviations: Step-through pattern;Decreased stride length Gait velocity:  Decreased Gait velocity interpretation: Below normal speed for age/gender General Gait Details: Pt demonstrating increased independence with gait, however, continues to required supervision for safety. Cues for appropriate RW management.    Stairs            Wheelchair Mobility    Modified Rankin (Stroke Patients Only)       Balance Overall balance assessment: Needs assistance Sitting-balance support: No upper extremity supported;Feet supported Sitting balance-Leahy Scale: Good     Standing balance support: Bilateral upper extremity supported Standing balance-Leahy Scale: Poor Standing balance comment: static standing                    Cognition Arousal/Alertness: Awake/alert Behavior During Therapy: WFL for tasks assessed/performed Overall Cognitive Status: Impaired/Different from baseline Area of Impairment: Memory     Memory: Decreased short-term memory         General Comments: Pt seems to be able to focus on all tasks this session without cues.     Exercises      General Comments        Pertinent Vitals/Pain Pain Assessment: No/denies pain    Home Living                      Prior Function            PT Goals (current goals can now be found in the care plan section) Acute Rehab PT Goals Patient Stated Goal: To return to independent Progress towards PT goals: Progressing toward goals    Frequency    Min 3X/week      PT Plan Current plan remains appropriate    Co-evaluation  End of Session Equipment Utilized During Treatment: Gait belt Activity Tolerance: Patient tolerated treatment well Patient left: with bed alarm set;in bed;with call bell/phone within reach     Time: 1053-1107 PT Time Calculation (min) (ACUTE ONLY): 14 min  Charges:  $Gait Training: 8-22 mins                    G Codes:      Mamie Levers 03/28/2016, 12:12 PM   Nicky Pugh, PT, [place]  Pager: [phone]

## 2016-03-28 NOTE — Progress Notes (Signed)
Patient is discharged from room 5M14 at this time. Alert and in stable condition. IV site d/c'd as well as tele. Instructions read to patient and husband with understanding verbalized. Left unit via wheelchair with all belongings at side.

## 2016-03-28 NOTE — Care Management Important Message (Signed)
Important Message  Patient Details  Name: GENEVY JULIANA MRN: [id number] Date of Birth: 04/18/1945   Medicare Important Message Given:  Yes    Tyrion Glaude 03/28/2016, 1:50 PM

## 2016-03-28 NOTE — Discharge Summary (Signed)
Physician Discharge Summary  Kimberly Lin CMK:[id number] DOB: 31-Dec-1945 DOA: 03/25/2016  PCP: Nance Pear., NP  Admit date: 03/25/2016 Discharge date: 03/28/2016  Recommendations for Outpatient Follow-up:  1. Continue cipro for 5 days on discharge   Discharge Diagnoses:  Principal Problem:   Sepsis secondary to UTI [facility]) Active Problems:   Hypertension   Diabetes type 2, uncontrolled ([place])   Falls   Acute encephalopathy   Hyponatremia   Anxiety and depression    Discharge Condition: stable   Diet recommendation: as tolerated   History of present illness:  71 y.o.femalewith medical history significant for hypertension, dyslipidemia, diabetes mellitus, anxiety, fibromyalgia who presented to ED with altered mental status, status post multiple falls. Patient reported multiple falls over past 2 weeks, weakness, abdominal pain and dysuria. Her symptoms have progressively worsened over past 4 days prior to this admission.  In ED, blood pressure was 126/59, heart rate 133, respiratory rate 17-43, oxygen saturation 82% on room air which has improved to 96% with nasal cannula oxygen support. Tmax was 99.96F. Blood work was notable for glucose of 292, troponin within normal limits, sodium 127, creatinine 1.48. White blood cell count was 10.7, hemoglobin 11.6. Alcohol level was within normal limits. Urinalysis showed small leukocytes and many bacteria. She was started on empiric Rocephin while awaiting urine culture results.   Hospital Course:   Sepsis secondary to UTI / leukocytosis - Sepsis criteria met on the admission with low-grade fever, tachycardia, tachypnea, hypoxia, leukocytosis - Evidence of urinary tract infection on urinalysis with leukocytes and many bacteria - UCx with multiple species - Repeat Ucx no growth - Empiric treatment with rocephin for 5 days on discharge   Mild troponin elevation - Troponin 0.0513 admission but the repeat value within normal  limits - Slight elevation likely demand ischemia from chronic kidney disease as well as sepsis - No reports of chest pain  Hyponatremia  - Likely from sepsis  - Sodium normalized with IV fluids   Hypokalemia - Potassium 3.3 and supplemented prior to discharge   Acute renal failure superimposed on chronic kidney disease stage III - Baseline creatinine 1.13 in October 2016 - Creatinine above baseline range, likely reflective of sepsis - Cr within baseline range   Anemia of chronic kidney disease - Hemoglobin stable  Controlled diabetes mellitus with diabetic nephropathy with long term insulin use  - A1c 6 months ago was 7 indicating good glycemic control  - Resume liraglutide and metformin - Renal function stable and within baseline range  - Continue gabapentin for diabetic neuropathy  Dyslipidemia associated with type 2 diabetes mellitus - Continue Lipitor 40 mg daily  Essential hypertension - Continue Norvasc and metoprolol - Continue losartan  Depression - Continue Effexor 150 mg daily    DVT prophylaxis: Lovenox subQ Code Status: full code  Family Communication: husband at the bedside this am    Consultants:   PT  Procedures:   None  Antimicrobials:   Rocephin 03/25/2016 --> 03/28/2016   Signed:  Leisa Lenz, MD  [facility] 03/28/2016, 11:52 AM  Pager #: [phone]  Time spent in minutes: less than 30 minutes    Discharge Exam: Vitals:   03/28/16 0638 03/28/16 0950  BP: (!) 144/81 124/69  Pulse: 97 83  Resp:  16  Temp: 98.8 F (37.1 C) 97.7 F (36.5 C)   Vitals:   03/27/16 2144 03/28/16 0124 03/28/16 0638 03/28/16 0950  BP: 136/69 134/71 (!) 144/81 124/69  Pulse: 91 92 97 83  Resp: 20  20  16  Temp: 98.1 F (36.7 C) 98 F (36.7 C) 98.8 F (37.1 C) 97.7 F (36.5 C)  TempSrc: Oral Oral Oral Oral  SpO2: 100% 100% 99% 96%  Weight:      Height:        General: Pt is alert, follows commands appropriately,  not in acute distress Cardiovascular: Regular rate and rhythm, S1/S2 + Respiratory: Clear to auscultation bilaterally, no wheezing, no crackles, no rhonchi Abdominal: Soft, non tender, non distended, bowel sounds +, no guarding Extremities: no edema, no cyanosis, pulses palpable bilaterally DP and PT Neuro: Grossly nonfocal  Discharge Instructions  Discharge Instructions    Call MD for:  persistant nausea and vomiting    Complete by:  As directed    Call MD for:  redness, tenderness, or signs of infection (pain, swelling, redness, odor or green/yellow discharge around incision site)    Complete by:  As directed    Call MD for:  severe uncontrolled pain    Complete by:  As directed    Diet - low sodium heart healthy    Complete by:  As directed    Discharge instructions    Complete by:  As directed    Continue Cipro for 5 days on discharge   Increase activity slowly    Complete by:  As directed      Allergies as of 03/28/2016      Reactions   Erythromycin Swelling   "all mycins"   Prednisone Other (See Comments)   "Insomnia" and "makes me crazy"      Medication List    STOP taking these medications   betamethasone dipropionate 0.05 % cream Commonly known as:  DIPROLENE     TAKE these medications   ALPRAZolam 1 MG tablet Commonly known as:  XANAX Take 1 tablet (1 mg total) by mouth 2 (two) times daily as needed for anxiety.   amLODipine 5 MG tablet Commonly known as:  NORVASC Take 1 tablet (5 mg total) by mouth daily.   aspirin 81 MG tablet Take 81 mg by mouth daily.   atorvastatin 40 MG tablet Commonly known as:  LIPITOR Take 1 tablet (40 mg total) by mouth daily.   Calcium-D 600-400 MG-UNIT Tabs Take 1 tablet by mouth daily.   ciprofloxacin 500 MG tablet Commonly known as:  CIPRO Take 1 tablet (500 mg total) by mouth 2 (two) times daily.   Co Q 10 100 MG Caps Take 100 mg by mouth daily.   diphenhydrAMINE 25 MG tablet Commonly known as:  BENADRYL Take  25 mg by mouth every 6 (six) hours as needed for itching.   escitalopram 20 MG tablet Commonly known as:  LEXAPRO Take 1 tablet (20 mg total) by mouth daily.   esomeprazole 40 MG capsule Commonly known as:  NEXIUM Take 1 capsule (40 mg total) by mouth daily at 12 noon.   Fish Oil 1000 MG Caps 2 caps by mouth twice daily What changed:  how much to take  how to take this  when to take this  additional instructions   gabapentin 300 MG capsule Commonly known as:  NEURONTIN Take 1 capsule (300 mg total) by mouth 2 (two) times daily. Reported on 03/21/2015   liraglutide 18 MG/3ML Sopn Commonly known as:  VICTOZA Inject 1.19m once daily   losartan 50 MG tablet Commonly known as:  COZAAR Take 1 tablet (50 mg total) by mouth daily.   Magnesium Oxide 400 (240 Mg) MG Tabs Take 1  tablet by mouth daily.   meclizine 25 MG tablet Commonly known as:  ANTIVERT Take 1 tablet (25 mg total) by mouth 3 (three) times daily as needed for dizziness.   metFORMIN 500 MG tablet Commonly known as:  GLUCOPHAGE TAKE TWO TABLETS BY MOUTH TWICE DAILY WITH MEALS   metoprolol succinate 50 MG 24 hr tablet Commonly known as:  TOPROL-XL Take 1 tablet (50 mg total) by mouth daily. Take with or immediately following a meal.   multivitamin tablet Take 1 tablet by mouth daily.   ondansetron 8 MG disintegrating tablet Commonly known as:  ZOFRAN ODT Take 1 tablet (8 mg total) by mouth every 8 (eight) hours as needed for nausea or vomiting.   pantoprazole 40 MG tablet Commonly known as:  PROTONIX Take 1 tablet (40 mg total) by mouth daily.   venlafaxine XR 75 MG 24 hr capsule Commonly known as:  EFFEXOR-XR TAKE TWO CAPSULES BY MOUTH ONCE DAILY   zolpidem 10 MG tablet Commonly known as:  AMBIEN Take 1 tablet (10 mg total) by mouth at bedtime as needed. for sleep      Follow-up Information    [facility] Follow up.   Specialty:  Home Health Services Why:  they will contact you for the  first visit.  Contact information: [place] [address] [phone]        O'SULLIVAN,[name] S., NP. Schedule an appointment as soon as possible for a visit in 1 week(s).   Specialty:  Internal Medicine Contact information: [address] [place] [address] [phone]            The results of significant diagnostics from this hospitalization (including imaging, microbiology, ancillary and laboratory) are listed below for reference.    Significant Diagnostic Studies: Dg Chest 2 View  Result Date: 03/25/2016 CLINICAL DATA:  Confusion and dizziness EXAM: CHEST  2 VIEW COMPARISON:  December 19, 2014 FINDINGS: There is no edema or consolidation. Heart is upper normal in size with pulmonary vascularity within normal limits. There is atherosclerotic calcification in the aorta. No adenopathy. There is evidence of an old fracture the lateral left clavicle with remodeling. IMPRESSION: No edema or consolidation.  Aortic atherosclerosis. Electronically Signed   By: Lowella Grip III M.D.   On: 03/25/2016 16:12   Ct Head Wo Contrast  Result Date: 03/25/2016 CLINICAL DATA:  Vertigo x2 weeks, fall, posterior head injury, confusion EXAM: CT HEAD WITHOUT CONTRAST TECHNIQUE: Contiguous axial images were obtained from the base of the skull through the vertex without intravenous contrast. COMPARISON:  MRI brain dated 11/13/2012 FINDINGS: Brain: No evidence of acute infarction, hemorrhage, hydrocephalus, extra-axial collection or mass lesion/mass effect. Global cortical atrophy. Subcortical white matter and periventricular small vessel ischemic changes. Vascular: No hyperdense vessel or unexpected calcification. Skull: Normal. Negative for fracture or focal lesion. Sinuses/Orbits: The visualized paranasal sinuses are essentially clear. The mastoid air cells are unopacified. Other: None. IMPRESSION: No evidence of acute intracranial abnormality. Atrophy with small  vessel ischemic changes. Electronically Signed   By: Julian Hy M.D.   On: 03/25/2016 16:16   US Renal  Result Date: 03/26/2016 CLINICAL DATA:  Acute kidney injury EXAM: RENAL / URINARY TRACT ULTRASOUND COMPLETE COMPARISON:  Ultrasound 07/16/2013 FINDINGS: Right Kidney: Length: 14.2 cm. Echogenicity within normal limits. No mass or hydronephrosis visualized. Left Kidney: Length: 12.3 cm. Echogenicity within normal limits. No mass or hydronephrosis visualized. Bladder: Appears normal for degree of bladder distention. IMPRESSION: No acute findings.  No hydronephrosis. Electronically Signed   By: Rolm Baptise M.D.   On: 03/26/2016 11:48    Microbiology: Recent Results (from the past 240 hour(s))  Culture, Urine     Status: None   Collection Time: 03/27/16  3:57 PM  Result Value Ref Range Status   Specimen Description URINE, CLEAN CATCH  Final   Special Requests NONE  Final   Culture NO GROWTH  Final   Report Status 03/28/2016 FINAL  Final     Labs: Basic Metabolic Panel:  Recent Labs Lab 03/25/16 1550 03/26/16 0459 03/27/16 1238 03/28/16 0355  NA 127* 129* 131* 138  K 3.8 3.4* 3.4* 3.3*  CL 88* 91* 95* 99*  CO2 27 26 23 28   GLUCOSE 292* 193* 271* 163*  BUN 29* 14 13 14   CREATININE 1.48* 1.15* 1.03* 1.07*  CALCIUM 8.4* 8.0* 8.2* 8.5*   Liver Function Tests:  Recent Labs Lab 03/25/16 1550  AST 65*  ALT 23  ALKPHOS 84  BILITOT 1.0  PROT 7.3  ALBUMIN 2.9*    Recent Labs Lab 03/25/16 1550  LIPASE 18   No results for input(s): AMMONIA in the last 168 hours. CBC:  Recent Labs Lab 03/25/16 1550 03/26/16 0459 03/27/16 1238 03/28/16 0355  WBC 10.0 10.7* 9.7 8.7  NEUTROABS 8.5*  --   --   --   HGB 12.3 11.6* 12.2 11.9*  HCT 36.6 34.3* 35.8* 35.8*  MCV 91.5 90.5 90.6 92.0  PLT 163 177 246 245   Cardiac Enzymes:  Recent Labs Lab 03/25/16 1550 03/26/16 0459 03/26/16 1018  TROPONINI <0.03 0.05* <0.03   BNP: BNP (last 3 results) No results for  input(s): BNP in the last 8760 hours.  ProBNP (last 3 results) No results for input(s): PROBNP in the last 8760 hours.  CBG:  Recent Labs Lab 03/27/16 1122 03/27/16 1630 03/27/16 2037 03/28/16 0643 03/28/16 1123  GLUCAP 234* 173* 202* 166* 172*

## 2016-03-28 NOTE — Discharge Instructions (Signed)
Acute Kidney Injury, Adult °Acute kidney injury (AKI) occurs when there is sudden (acute) damage to the kidneys. A small amount of kidney damage may not cause problems, but a large amount of damage may make it difficult or impossible for the kidneys to work the way they should. AKI may develop into long-lasting (chronic) kidney disease. Early detection and treatment of AKI may prevent kidney damage from becoming permanent or getting worse. °What are the causes? °Common causes of this condition include: °· A problem with blood flow to the kidneys. This may be caused by: °¨ Blood loss. °¨ Heart and blood vessel (cardiovascular) disease. °¨ Severe burns. °¨ Liver disease. °· Direct damage to the kidneys. This may be caused by: °¨ Some medicines. °¨ A kidney infection. °¨ Poisoning. °¨ Being around or in contact with poisonous (toxic) substances. °¨ A surgical wound. °¨ A hard, direct force to the kidney area. °· A sudden blockage of urine flow. This may be caused by: °¨ Cancer. °¨ Kidney stones. °¨ Enlarged prostate in males. °What are the signs or symptoms? °Symptoms develop slowly and may not be obvious until the kidney damage becomes severe. It is possible to have AKI for years without showing any symptoms. Symptoms of this condition can include: °· Swelling (edema) of the face, legs, ankles, or feet. °· Numbness, tingling, or loss of feeling (sensation) in the hands or feet. °· Tiredness (lethargy). °· Nausea or vomiting. °· Confusion or trouble concentrating. °· Problems with urination, such as: °¨ Painful or burning feeling during urination. °¨ Decreased urine production. °¨ Frequent urination, especially at night. °¨ Bloody urine. °· Muscle twitches and cramps, especially in the legs. °· Shortness of breath. °· Weakness. °· Constant itchiness. °· Loss of appetite. °· Metallic taste in the mouth. °· Trouble sleeping. °· Pale lining of the eyelids and surface of the eye (conjunctiva). °How is this diagnosed? °This  condition may be diagnosed with various tests. Tests may include: °· Blood tests. °· Urine tests. °· Imaging tests. °· A test in which a sample of tissue is removed from the kidneys to be looked at under a microscope (kidney biopsy). °How is this treated? °Treatment of AKI varies depending on the cause and severity of the kidney damage. In mild cases, treatment may not be needed. The kidneys may heal on their own. °If AKI is more severe, your health care provider will treat the cause of the kidney damage, help the kidneys heal, and prevent problems from occurring. Severe cases may require a procedure to remove toxic wastes from the body (dialysis) or surgery to repair kidney damage. Surgery may involve: °· Repair of a torn kidney. °· Removal of a urine flow obstruction. °Follow these instructions at home: °· Follow your prescribed diet. °· Take over-the-counter and prescription medicines only as told by your health care provider. °¨ Do not take any new medicines unless approved by your health care provider. Many medicines can worsen your kidney damage. °¨ Do not take any vitamin and mineral supplements unless approved by your health care provider. Many nutritional supplements can worsen your kidney damage. °¨ The dose of some medicines that you take may need to be adjusted. °· Do not use any tobacco products, such as cigarettes, chewing tobacco, and e-cigarettes. If you need help quitting, ask your health care provider. °· Keep all follow-up visits as told by your health care provider. This is important. °· Keep track of your blood pressure. Report changes in your blood pressure as told by your   health care provider. °· Achieve and maintain a healthy weight. If you need help with this, ask your health care provider. °· Start or continue an exercise plan. Try to exercise at least 30 minutes a day, 5 days a week. °· Stay current with immunizations as told by your health care provider. °Where to find more  information: °· American Association of Kidney Patients: www.aakp.org °· National Kidney Foundation: www.kidney.org °· American Kidney Fund: www.akfinc.org °· [employer] Program: www.lifeoptions.org and www.kidneyschool.org °Contact a health care provider if: °· Your symptoms get worse. °· You develop new symptoms. °Get help right away if: °· You develop symptoms of end-stage kidney disease, which include: °¨ Headaches. °¨ Abnormally dark or light skin. °¨ Numbness in the hands or feet. °¨ Easy bruising. °¨ Frequent hiccups. °¨ Chest pain. °¨ Shortness of breath. °¨ End of menstruation in women. °· You have a fever. °· You have decreased urine production. °· You have pain or bleeding when you urinate. °This information is not intended to replace advice given to you by your health care provider. Make sure you discuss any questions you have with your health care provider. °Document Released: 08/12/2010 Document Revised: 09/06/2015 Document Reviewed: 09/26/2011 °Elsevier Interactive Patient Education © 2017 [employer] Inc. ° ° °

## 2016-03-28 NOTE — Telephone Encounter (Signed)
Please contact pt to arrange TCM follow up.  

## 2016-03-31 NOTE — Telephone Encounter (Signed)
Transition Care Management Follow-up Telephone Call  PCP: Nance Pear., NP  Admit date: 03/25/2016 Discharge date: 03/28/2016  Recommendations for Outpatient Follow-up:  1. Continue cipro for 5 days on discharge   Discharge Diagnoses:  Principal Problem:   Sepsis secondary to UTI [facility]) Active Problems:   Hypertension   Diabetes type 2, uncontrolled ([place])   Falls   Acute encephalopathy   Hyponatremia   Anxiety and depression    Discharge Condition: stable    How have you been since you were released from the hospital? Patient stated that she's "doing great".   Do you understand why you were in the hospital? yes, patient voiced, "sepsis in bloodstream".   Do you understand the discharge instructions? yes   Where were you discharged to? Home   Items Reviewed:  Medications reviewed: yes, patient stated that she's currently taking Cipro and would like to review all other medications at the upcoming visit with PCP.  Allergies reviewed: yes  Dietary changes reviewed: yes, low sodium heart healthy  Referrals reviewed: yes, follow-up with PCP.   Functional Questionnaire:   Activities of Daily Living (ADLs):   She states they are independent in the following: ambulation, bathing and hygiene, feeding, continence, grooming, toileting and dressing States they require assistance with the following: None   Any transportation issues/concerns?: no   Any patient concerns? no   Confirmed importance and date/time of follow-up visits scheduled yes, 04/07/16 at 10:45 AM.  Provider Appointment booked with Debbrah Alar, NP.  Confirmed with patient if condition begins to worsen call PCP or go to the ER.  Patient was given the office number and encouraged to call back with question or concerns.  : yes

## 2016-04-02 ENCOUNTER — Telehealth: Payer: Self-pay | Admitting: Family

## 2016-04-02 DIAGNOSIS — I2583 Coronary atherosclerosis due to lipid rich plaque: Principal | ICD-10-CM

## 2016-04-02 DIAGNOSIS — I2510 Atherosclerotic heart disease of native coronary artery without angina pectoris: Secondary | ICD-10-CM

## 2016-04-02 DIAGNOSIS — I10 Essential (primary) hypertension: Secondary | ICD-10-CM

## 2016-04-02 MED ORDER — ATORVASTATIN CALCIUM 40 MG PO TABS: 40.0000 mg | ORAL_TABLET | Freq: Every day | ORAL | 1 refills | Status: DC

## 2016-04-02 MED ORDER — AMLODIPINE BESYLATE 5 MG PO TABS: 5.0000 mg | ORAL_TABLET | Freq: Every day | ORAL | 1 refills | Status: DC

## 2016-04-02 MED ORDER — VENLAFAXINE HCL ER 75 MG PO CP24: ORAL_CAPSULE | ORAL | 1 refills | Status: DC

## 2016-04-02 MED ORDER — METOPROLOL SUCCINATE ER 50 MG PO TB24: 50.0000 mg | ORAL_TABLET | Freq: Every day | ORAL | 1 refills | Status: DC

## 2016-04-02 MED ORDER — LOSARTAN POTASSIUM 50 MG PO TABS: 50.0000 mg | ORAL_TABLET | Freq: Every day | ORAL | 1 refills | Status: DC

## 2016-04-02 NOTE — Telephone Encounter (Signed)
Upon review of below request pt is requesting glimepiride but last Rx was in 2016. Current med list indicates pt should be taking metformin and victoza. Spoke with pt and she states she was requesting medications from an old list and please discard glimepiride request. Refills sent.

## 2016-04-02 NOTE — Telephone Encounter (Signed)
Relation to [id number] Call back [place], [place] (Phone) [phone] (Fax)     Reason for call:   Patient requesting a refill  amLODipine (NORVASC) 5 MG tablet  atorvastatin (LIPITOR) 40 MG tablet  losartan (COZAAR) 50 MG tablet  metoprolol succinate (TOPROL-XL) 50 MG 24 hr tablet  venlafaxine XR (EFFEXOR-XR) 75 MG 24 hr capsule  Glimepiride

## 2016-04-07 ENCOUNTER — Ambulatory Visit (INDEPENDENT_AMBULATORY_CARE_PROVIDER_SITE_OTHER): Payer: Medicare Other | Admitting: Family

## 2016-04-07 ENCOUNTER — Ambulatory Visit (HOSPITAL_BASED_OUTPATIENT_CLINIC_OR_DEPARTMENT_OTHER)
Admission: RE | Admit: 2016-04-07 | Discharge: 2016-04-07 | Disposition: A | Discharge status: Home / Self Care | Payer: Medicare Other | Source: Ambulatory Visit | Attending: Family | Admitting: Family

## 2016-04-07 ENCOUNTER — Encounter: Payer: Self-pay | Admitting: Family

## 2016-04-07 ENCOUNTER — Encounter (HOSPITAL_BASED_OUTPATIENT_CLINIC_OR_DEPARTMENT_OTHER): Payer: Self-pay

## 2016-04-07 VITALS — BP 139/89 | HR 121 | Resp 16 | Ht 64.0 in | Wt 183.4 lb

## 2016-04-07 DIAGNOSIS — Z1231 Encounter for screening mammogram for malignant neoplasm of breast: Secondary | ICD-10-CM | POA: Insufficient documentation

## 2016-04-07 DIAGNOSIS — R000 Tachycardia, unspecified: Secondary | ICD-10-CM | POA: Diagnosis not present

## 2016-04-07 DIAGNOSIS — N3001 Acute cystitis with hematuria: Secondary | ICD-10-CM | POA: Diagnosis not present

## 2016-04-07 DIAGNOSIS — Z1239 Encounter for other screening for malignant neoplasm of breast: Secondary | ICD-10-CM

## 2016-04-07 DIAGNOSIS — N390 Urinary tract infection, site not specified: Secondary | ICD-10-CM | POA: Diagnosis not present

## 2016-04-07 LAB — POC URINALSYSI DIPSTICK (AUTOMATED)
Glucose, UA: NEGATIVE
Ketones, UA: NEGATIVE
Nitrite, UA: NEGATIVE
PH UA: 5.5
Spec Grav, UA: >=1.03
Urobilinogen, UA: NEGATIVE

## 2016-04-07 MED ORDER — CIPROFLOXACIN HCL 250 MG PO TABS: 250.0000 mg | ORAL_TABLET | Freq: Two times a day (BID) | ORAL | 0 refills | Status: DC

## 2016-04-07 NOTE — Progress Notes (Signed)
Pre visit review using our clinic review tool, if applicable. No additional management support is needed unless otherwise documented below in the visit note. 

## 2016-04-07 NOTE — Patient Instructions (Signed)
Please bring a urine culture sample in to [place] this afternoon. Begin cipro.  Call if you develop fever, increased weakness, confusion.

## 2016-04-07 NOTE — Progress Notes (Signed)
Subjective:    Patient ID: Kimberly Lin, female    DOB: Jan 08, 1946, 71 y.o.   MRN: [id number]  HPI  Kimberly Lin is a 71 yr old female who presents today for hospital follow up.  Hospital discharge summary is reviewed.    She presented to the ED on 03/25/16 with AMS, falls, weakness, abdominal pain and dysuria.  She was noted to have UTI/leukocytosis and was admitted for sepsis secondary to UTI.  Urine culture grew multiple species. She was treated with abx and sent home with an addition 5 day course of cipro which she completed a few days ago.   Since returning home she has been feeling weak.  Reports that she does not remember going to the hospital.    Of note, she had a mild elevation of her troponin during her admission x 1 which returned to normal and was felt secondary to demand ischemia.    Review of Systems See HPI  Past Medical History:  Diagnosis Date  . Abdominal mass   . Abnormal nuclear stress test 07/05/2014  . Anxiety   . Arthritis    osteoarthritis  . Colitis 2014  . Depression   . Diabetes mellitus   . Fibromyalgia   . GERD (gastroesophageal reflux disease)   . Hiatal hernia   . History of chicken pox   . History of septic shock 12/2009  . Hyperlipidemia   . Hypertension   . Migraine   . OSA (obstructive sleep apnea) 03/12/2011  . Personal history of colonic polyps   . Spinal stenosis      Social History   Social History  . Marital status: Married    Spouse name: N/A  . Number of children: 2  . Years of education: N/A   Occupational History  . Not on file.   Social History Main Topics  . Smoking status: Former Smoker    Types: Cigarettes  . Smokeless tobacco: Never Used     Comment: Smoked socially.  . Alcohol use 3.6 - 4.8 oz/week    6 - 8 Glasses of wine per week  . Drug use: No  . Sexual activity: Not on file   Other Topics Concern  . Not on file   Social History Narrative   Regular exercise:  No   Caffeine use: 1 cup daily            Past Surgical History:  Procedure Laterality Date  . APPENDECTOMY  1983  . BREAST BIOPSY Right 2007, 2009  . CARDIAC CATHETERIZATION N/A 07/05/2014   Procedure: Left Heart Cath and Coronary Angiography;  Surgeon: Sherren Mocha, [place];  Location: [facility];  Service: Cardiovascular;  Laterality: N/A;  . COLONOSCOPY  2014   normal   . MEDIAL PARTIAL KNEE REPLACEMENT Bilateral 2006  . OVARIAN CYST REMOVAL  2008   Pt states she had a ?cyst removed ?ovaries  . POLYPECTOMY  1998    Family History  Problem Relation Age of Onset  . Arthritis Mother   . Hyperlipidemia Mother   . Hypertension Mother   . Diabetes Mother   . Heart disease Mother   . Breast cancer Mother   . Lung cancer Brother   . Heart disease Brother     x 2  . Hypertension Brother   . Hyperlipidemia Brother   . Diabetes Brother   . Sudden death Father   . Lung cancer Father   . Rectal cancer Father   . Diabetes Sister   .  Hyperlipidemia Sister   . Hypertension Sister   . Diabetes Maternal Aunt   . Heart attack Maternal Aunt   . Hyperlipidemia Maternal Aunt   . Hypertension Maternal Aunt   . Hypertension Maternal Uncle   . Hyperlipidemia Maternal Uncle   . Heart attack Maternal Uncle   . Diabetes Maternal Uncle   . Diabetes Paternal Aunt   . Heart attack Paternal Aunt   . Hyperlipidemia Paternal Aunt   . Hypertension Paternal Aunt   . Hypertension Paternal Uncle   . Hyperlipidemia Paternal Uncle   . Heart attack Paternal Uncle   . Diabetes Paternal Uncle   . Prostate cancer Brother     Allergies  Allergen Reactions  . Erythromycin Swelling    "all mycins"  . Prednisone Other (See Comments)    "Insomnia" and "makes me crazy"    Current Outpatient Prescriptions on File Prior to Visit  Medication Sig Dispense Refill  . ALPRAZolam (XANAX) 1 MG tablet Take 1 tablet (1 mg total) by mouth 2 (two) times daily as needed for anxiety. 28 tablet 0  . amLODipine (NORVASC) 5 MG tablet Take 1  tablet (5 mg total) by mouth daily. 90 tablet 1  . aspirin 81 MG tablet Take 81 mg by mouth daily.      Marland Kitchen atorvastatin (LIPITOR) 40 MG tablet Take 1 tablet (40 mg total) by mouth daily. 90 tablet 1  . Calcium Carbonate-Vitamin D (CALCIUM-D) 600-400 MG-UNIT TABS Take 1 tablet by mouth daily.     . Coenzyme Q10 (CO Q 10) 100 MG CAPS Take 100 mg by mouth daily.    . diphenhydrAMINE (BENADRYL) 25 MG tablet Take 25 mg by mouth every 6 (six) hours as needed for itching.     . escitalopram (LEXAPRO) 20 MG tablet Take 1 tablet (20 mg total) by mouth daily. 90 tablet 1  . esomeprazole (NEXIUM) 40 MG capsule Take 1 capsule (40 mg total) by mouth daily at 12 noon. 30 capsule 2  . gabapentin (NEURONTIN) 300 MG capsule Take 1 capsule (300 mg total) by mouth 2 (two) times daily. Reported on 03/21/2015 180 capsule 1  . liraglutide (VICTOZA) 18 MG/3ML SOPN Inject 1.8mg  once daily 9 mL 1  . losartan (COZAAR) 50 MG tablet Take 1 tablet (50 mg total) by mouth daily. 90 tablet 1  . Magnesium Oxide 400 (240 Mg) MG TABS Take 1 tablet by mouth daily. 30 tablet   . meclizine (ANTIVERT) 25 MG tablet Take 1 tablet (25 mg total) by mouth 3 (three) times daily as needed for dizziness. 30 tablet 0  . metFORMIN (GLUCOPHAGE) 500 MG tablet TAKE TWO TABLETS BY MOUTH TWICE DAILY WITH MEALS 120 tablet 0  . metoprolol succinate (TOPROL-XL) 50 MG 24 hr tablet Take 1 tablet (50 mg total) by mouth daily. Take with or immediately following a meal. 90 tablet 1  . Multiple Vitamin (MULTIVITAMIN) tablet Take 1 tablet by mouth daily.      . Omega-3 Fatty Acids (FISH OIL) 1000 MG CAPS 2 caps by mouth twice daily (Patient taking differently: Take 2,000 mg by mouth 2 (two) times daily. )  0  . ondansetron (ZOFRAN ODT) 8 MG disintegrating tablet Take 1 tablet (8 mg total) by mouth every 8 (eight) hours as needed for nausea or vomiting. 20 tablet 0  . pantoprazole (PROTONIX) 40 MG tablet Take 1 tablet (40 mg total) by mouth daily. 90 tablet 1  .  venlafaxine XR (EFFEXOR-XR) 75 MG 24 hr capsule TAKE TWO  CAPSULES BY MOUTH ONCE DAILY 180 capsule 1  . zolpidem (AMBIEN) 10 MG tablet Take 1 tablet (10 mg total) by mouth at bedtime as needed. for sleep 14 tablet 0   No current facility-administered medications on file prior to visit.     BP 139/89 (BP Location: Right Arm, Patient Position: Sitting, Cuff Size: Normal)   Pulse (!) 121   Resp 16   Ht 5\' 4"  (1.626 m)   Wt 183 lb 6.4 oz (83.2 kg)   SpO2 99%   BMI 31.48 kg/m       Objective:   Physical Exam  Constitutional: She is oriented to person, place, and time. She appears well-developed and well-nourished.  HENT:  Head: Normocephalic and atraumatic.  Cardiovascular: Normal rate, regular rhythm and normal heart sounds.   No murmur heard. Pulmonary/Chest: Effort normal and breath sounds normal. No respiratory distress. She has no wheezes.  Neurological: She is alert and oriented to person, place, and time.  Psychiatric: She has a normal mood and affect. Her behavior is normal. Judgment and thought content normal.          Assessment & Plan:  Tachycardia- mild- follow up HR is 111.  EKG is personally reviewed and compared to EKG 03/25/16.  EKG notes NSR and appears unchanged.    UTI- urinalysis still suggestive of UTI.  I had trouble getting a good temp on her because she was drinking ice water. Temp registered at 95.5 but I believe this not to be accurate since she was drinking water.  She was unable to provide adequate urine for culture so she will bring [place] a sample back later today. In the meantime, will initiate cipro. She had a hospitalization for UTI/sepsis back in [place] as well in the past.  I would like to refer her to urology to see if there may be an underlying urological disorder contributing to her symptoms. Will obtain CMET/CBC.  I spoke to pt's husband and asked him to remind the patient to return to the lab for blood draw when she returns her urine sample.

## 2016-04-07 NOTE — Addendum Note (Signed)
Addended by: Kelle Darting A on: 04/07/2016 06:50 PM   Modules accepted: Orders

## 2016-04-08 ENCOUNTER — Encounter: Payer: Self-pay | Admitting: Family

## 2016-04-08 DIAGNOSIS — Z79899 Other long term (current) drug therapy: Secondary | ICD-10-CM | POA: Diagnosis not present

## 2016-04-08 LAB — CBC WITH DIFFERENTIAL/PLATELET
Basophils Absolute: 0.2 10*3/uL — ABNORMAL HIGH (ref 0.0–0.1)
Basophils Relative: 1.4 % (ref 0.0–3.0)
EOS PCT: 1.9 % (ref 0.0–5.0)
Eosinophils Absolute: 0.2 10*3/uL (ref 0.0–0.7)
HCT: 42.8 % (ref 36.0–46.0)
Hemoglobin: 14.3 g/dL (ref 12.0–15.0)
LYMPHS ABS: 1.9 10*3/uL (ref 0.7–4.0)
Lymphocytes Relative: 17.2 % (ref 12.0–46.0)
MCHC: 33.4 g/dL (ref 30.0–36.0)
MCV: 93.6 fl (ref 78.0–100.0)
MONO ABS: 0.8 10*3/uL (ref 0.1–1.0)
MONOS PCT: 7.1 % (ref 3.0–12.0)
NEUTROS ABS: 7.8 10*3/uL — AB (ref 1.4–7.7)
NEUTROS PCT: 72.4 % (ref 43.0–77.0)
PLATELETS: 441 10*3/uL — AB (ref 150.0–400.0)
RBC: 4.57 Mil/uL (ref 3.87–5.11)
RDW: 15 % (ref 11.5–15.5)
WBC: 10.9 10*3/uL — ABNORMAL HIGH (ref 4.0–10.5)

## 2016-04-08 LAB — COMPREHENSIVE METABOLIC PANEL
ALK PHOS: 60 U/L (ref 39–117)
ALT: 18 U/L (ref 0–35)
AST: 30 U/L (ref 0–37)
Albumin: 3.9 g/dL (ref 3.5–5.2)
BILIRUBIN TOTAL: 0.5 mg/dL (ref 0.2–1.2)
BUN: 18 mg/dL (ref 6–23)
CO2: 25 meq/L (ref 19–32)
Calcium: 10.1 mg/dL (ref 8.4–10.5)
Chloride: 98 mEq/L (ref 96–112)
Creatinine, Ser: 1.16 mg/dL (ref 0.40–1.20)
GFR: 49.04 mL/min — AB (ref >60.00)
GLUCOSE: 73 mg/dL (ref 70–99)
Potassium: 5.5 mEq/L — ABNORMAL HIGH (ref 3.5–5.1)
SODIUM: 136 meq/L (ref 135–145)
TOTAL PROTEIN: 7.7 g/dL (ref 6.0–8.3)

## 2016-04-09 LAB — URINE CULTURE

## 2016-04-09 MED ORDER — METFORMIN HCL 500 MG PO TABS: ORAL_TABLET | ORAL | 1 refills | Status: DC

## 2016-04-10 ENCOUNTER — Other Ambulatory Visit: Payer: Self-pay | Admitting: Family

## 2016-04-10 MED ORDER — METOPROLOL SUCCINATE ER 50 MG PO TB24: 100.0000 mg | ORAL_TABLET | Freq: Every day | ORAL | 1 refills | Status: DC

## 2016-04-10 MED ORDER — AMLODIPINE BESYLATE 10 MG PO TABS: 10.0000 mg | ORAL_TABLET | Freq: Every day | ORAL | 0 refills | Status: DC

## 2016-04-10 MED ORDER — METOPROLOL SUCCINATE ER 50 MG PO TB24: 50.0000 mg | ORAL_TABLET | Freq: Every day | ORAL | 1 refills | Status: DC

## 2016-04-10 NOTE — Telephone Encounter (Signed)
Spoke with pt about medication change pt voiced understanding. Pt stated it is  okay to send rx to [facility].

## 2016-04-10 NOTE — Telephone Encounter (Signed)
Spoke with pt about results.. Pt voiced understanding. Pt stated that when on Toprol twice a day before it made her have falls pt wants to know if she should still take 2 a day. Please advise

## 2016-04-10 NOTE — Telephone Encounter (Signed)
Potassium is elevated.  D/c losartan.  Increase toprol xl to 100mg  once daily (take 2 tabs of the toprol).  Follow up with me in 1 week.  Urine is growing several different types of bacteria. Complete cipro.

## 2016-04-10 NOTE — Telephone Encounter (Signed)
OK, instead of increasing metoprolol, lets increase amlodipine from 5mg  to 10mg . OK to send rx to [facility], pt can take two 5 mg tabs until she gets the 10 in the mail.

## 2016-04-16 ENCOUNTER — Other Ambulatory Visit (HOSPITAL_COMMUNITY)
Admission: RE | Admit: 2016-04-16 | Discharge: 2016-04-16 | Disposition: A | Discharge status: Home / Self Care | Payer: Medicare Other | Source: Ambulatory Visit | Attending: Family | Admitting: Family

## 2016-04-16 ENCOUNTER — Telehealth: Payer: Self-pay | Admitting: Family

## 2016-04-16 ENCOUNTER — Encounter: Payer: Self-pay | Admitting: Family

## 2016-04-16 ENCOUNTER — Ambulatory Visit (INDEPENDENT_AMBULATORY_CARE_PROVIDER_SITE_OTHER): Payer: Medicare Other | Admitting: Family

## 2016-04-16 VITALS — BP 114/92 | HR 80 | Temp 97.6°F | Resp 16 | Ht 64.0 in | Wt 185.2 lb

## 2016-04-16 DIAGNOSIS — N3000 Acute cystitis without hematuria: Secondary | ICD-10-CM | POA: Diagnosis not present

## 2016-04-16 DIAGNOSIS — I2510 Atherosclerotic heart disease of native coronary artery without angina pectoris: Secondary | ICD-10-CM | POA: Diagnosis not present

## 2016-04-16 DIAGNOSIS — D75839 Thrombocytosis, unspecified: Secondary | ICD-10-CM

## 2016-04-16 DIAGNOSIS — I10 Essential (primary) hypertension: Secondary | ICD-10-CM | POA: Diagnosis not present

## 2016-04-16 DIAGNOSIS — R5383 Other fatigue: Secondary | ICD-10-CM

## 2016-04-16 DIAGNOSIS — E875 Hyperkalemia: Secondary | ICD-10-CM | POA: Diagnosis not present

## 2016-04-16 DIAGNOSIS — N760 Acute vaginitis: Secondary | ICD-10-CM

## 2016-04-16 DIAGNOSIS — D473 Essential (hemorrhagic) thrombocythemia: Secondary | ICD-10-CM

## 2016-04-16 DIAGNOSIS — I2583 Coronary atherosclerosis due to lipid rich plaque: Secondary | ICD-10-CM | POA: Diagnosis not present

## 2016-04-16 LAB — BASIC METABOLIC PANEL
BUN: 9 mg/dL (ref 6–23)
CHLORIDE: 96 meq/L (ref 96–112)
CO2: 28 meq/L (ref 19–32)
Calcium: 9.7 mg/dL (ref 8.4–10.5)
Creatinine, Ser: 0.98 mg/dL (ref 0.40–1.20)
GFR: 59.58 mL/min — ABNORMAL LOW (ref >60.00)
Glucose, Bld: 113 mg/dL — ABNORMAL HIGH (ref 70–99)
POTASSIUM: 3.9 meq/L (ref 3.5–5.1)
SODIUM: 133 meq/L — AB (ref 135–145)

## 2016-04-16 NOTE — Progress Notes (Signed)
Pre visit review using our clinic review tool, if applicable. No additional management support is needed unless otherwise documented below in the visit note. 

## 2016-04-16 NOTE — Patient Instructions (Signed)
Please complete lab work prior to leaving. Call if recurrent urinary symptoms. We will let you know how your lab work looks.

## 2016-04-16 NOTE — Telephone Encounter (Signed)
Spoke with Roselyn Reef at [facility] and they can see pt tomorrow at 9:15 with Dr Tresa Moore. Attempted to notify pt and left message to check mychart account. Mychart message sent.

## 2016-04-16 NOTE — Addendum Note (Signed)
Addended by: Kelle Darting A on: 04/16/2016 12:21 PM   Modules accepted: Orders

## 2016-04-16 NOTE — Progress Notes (Signed)
Subjective:    Patient ID: Kimberly Lin, female    DOB: 04-Apr-1945, 71 y.o.   MRN: [id number]  HPI  Kimberly Lin is a 71 yr old female who presents today for follow up.  UTI- Urine culture 04/07/16 noted multiple organisms.  Each >10,000. She was noted to have a mild elevation of her platelets (441K) and mild elevation of her WBC (10.9).  She was treated with cipro. She reports that she is still experiencing fatigue. She is not sleeping well. She does have known hx of OSA, but declined CPAP. OSA was mild in 2013.  Her weight was 183 pounds back in 2012. Denies dysuria, frequency, urgency.    Vaginitis- notes vaginal irritation x 4 days. Only on one side.  Has been putting vaseline. No d/c or odor.    Review of Systems    see HPI  Past Medical History:  Diagnosis Date  . Abdominal mass   . Abnormal nuclear stress test 07/05/2014  . Anxiety   . Arthritis    osteoarthritis  . Colitis 2014  . Depression   . Diabetes mellitus   . Fibromyalgia   . GERD (gastroesophageal reflux disease)   . Hiatal hernia   . History of chicken pox   . History of septic shock 12/2009  . Hyperlipidemia   . Hypertension   . Migraine   . OSA (obstructive sleep apnea) 03/12/2011  . Personal history of colonic polyps   . Spinal stenosis      Social History   Social History  . Marital status: Married    Spouse name: N/A  . Number of children: 2  . Years of education: N/A   Occupational History  . Not on file.   Social History Main Topics  . Smoking status: Former Smoker    Types: Cigarettes  . Smokeless tobacco: Never Used     Comment: Smoked socially.  . Alcohol use 3.6 - 4.8 oz/week    6 - 8 Glasses of wine per week  . Drug use: No  . Sexual activity: Not on file   Other Topics Concern  . Not on file   Social History Narrative   Regular exercise:  No   Caffeine use: 1 cup daily          Past Surgical History:  Procedure Laterality Date  . APPENDECTOMY  1983  . BREAST  BIOPSY Right 2007, 2009  . CARDIAC CATHETERIZATION N/A 07/05/2014   Procedure: Left Heart Cath and Coronary Angiography;  Surgeon: Sherren Mocha, MD;  Location: [facility];  Service: Cardiovascular;  Laterality: N/A;  . COLONOSCOPY  2014   normal   . MEDIAL PARTIAL KNEE REPLACEMENT Bilateral 2006  . OVARIAN CYST REMOVAL  2008   Pt states she had a ?cyst removed ?ovaries  . POLYPECTOMY  1998    Family History  Problem Relation Age of Onset  . Arthritis Mother   . Hyperlipidemia Mother   . Hypertension Mother   . Diabetes Mother   . Heart disease Mother   . Breast cancer Mother   . Lung cancer Brother   . Heart disease Brother     x 2  . Hypertension Brother   . Hyperlipidemia Brother   . Diabetes Brother   . Sudden death Father   . Lung cancer Father   . Rectal cancer Father   . Diabetes Sister   . Hyperlipidemia Sister   . Hypertension Sister   . Diabetes Maternal Aunt   .  Heart attack Maternal Aunt   . Hyperlipidemia Maternal Aunt   . Hypertension Maternal Aunt   . Hypertension Maternal Uncle   . Hyperlipidemia Maternal Uncle   . Heart attack Maternal Uncle   . Diabetes Maternal Uncle   . Diabetes Paternal Aunt   . Heart attack Paternal Aunt   . Hyperlipidemia Paternal Aunt   . Hypertension Paternal Aunt   . Hypertension Paternal Uncle   . Hyperlipidemia Paternal Uncle   . Heart attack Paternal Uncle   . Diabetes Paternal Uncle   . Prostate cancer Brother     Allergies  Allergen Reactions  . Erythromycin Swelling    "all mycins"  . Losartan     hyperkalemia  . Prednisone Other (See Comments)    "Insomnia" and "makes me crazy"    Current Outpatient Prescriptions on File Prior to Visit  Medication Sig Dispense Refill  . ALPRAZolam (XANAX) 1 MG tablet Take 1 tablet (1 mg total) by mouth 2 (two) times daily as needed for anxiety. 28 tablet 0  . amLODipine (NORVASC) 10 MG tablet Take 1 tablet (10 mg total) by mouth daily. 90 tablet 0  . aspirin 81  MG tablet Take 81 mg by mouth daily.      Marland Kitchen atorvastatin (LIPITOR) 40 MG tablet Take 1 tablet (40 mg total) by mouth daily. 90 tablet 1  . Calcium Carbonate-Vitamin D (CALCIUM-D) 600-400 MG-UNIT TABS Take 1 tablet by mouth daily.     . ciprofloxacin (CIPRO) 250 MG tablet Take 1 tablet (250 mg total) by mouth 2 (two) times daily. 6 tablet 0  . Coenzyme Q10 (CO Q 10) 100 MG CAPS Take 100 mg by mouth daily.    . diphenhydrAMINE (BENADRYL) 25 MG tablet Take 25 mg by mouth every 6 (six) hours as needed for itching.     . escitalopram (LEXAPRO) 20 MG tablet Take 1 tablet (20 mg total) by mouth daily. 90 tablet 1  . esomeprazole (NEXIUM) 40 MG capsule Take 1 capsule (40 mg total) by mouth daily at 12 noon. 30 capsule 2  . gabapentin (NEURONTIN) 300 MG capsule Take 1 capsule (300 mg total) by mouth 2 (two) times daily. Reported on 03/21/2015 180 capsule 1  . liraglutide (VICTOZA) 18 MG/3ML SOPN Inject 1.8mg  once daily 9 mL 1  . Magnesium Oxide 400 (240 Mg) MG TABS Take 1 tablet by mouth daily. 30 tablet   . meclizine (ANTIVERT) 25 MG tablet Take 1 tablet (25 mg total) by mouth 3 (three) times daily as needed for dizziness. 30 tablet 0  . metFORMIN (GLUCOPHAGE) 500 MG tablet TAKE TWO TABLETS BY MOUTH TWICE DAILY WITH MEALS 120 tablet 1  . metoprolol succinate (TOPROL-XL) 50 MG 24 hr tablet Take 1 tablet (50 mg total) by mouth daily. Take with or immediately following a meal. 90 tablet 1  . Multiple Vitamin (MULTIVITAMIN) tablet Take 1 tablet by mouth daily.      . Omega-3 Fatty Acids (FISH OIL) 1000 MG CAPS 2 caps by mouth twice daily (Patient taking differently: Take 2,000 mg by mouth 2 (two) times daily. )  0  . ondansetron (ZOFRAN ODT) 8 MG disintegrating tablet Take 1 tablet (8 mg total) by mouth every 8 (eight) hours as needed for nausea or vomiting. 20 tablet 0  . pantoprazole (PROTONIX) 40 MG tablet Take 1 tablet (40 mg total) by mouth daily. 90 tablet 1  . venlafaxine XR (EFFEXOR-XR) 75 MG 24 hr  capsule TAKE TWO CAPSULES BY MOUTH ONCE DAILY  180 capsule 1  . zolpidem (AMBIEN) 10 MG tablet Take 1 tablet (10 mg total) by mouth at bedtime as needed. for sleep 14 tablet 0   No current facility-administered medications on file prior to visit.     BP (!) 114/92 (BP Location: Right Arm, Cuff Size: Large)   Pulse 80   Temp 97.6 F (36.4 C) (Oral)   Resp 16   Ht 5\' 4"  (1.626 m)   Wt 185 lb 3.2 oz (84 kg)   SpO2 100% Comment: room air  BMI 31.79 kg/m    Objective:   Physical Exam  Constitutional: She is oriented to person, place, and time. She appears well-developed and well-nourished.  HENT:  Head: Normocephalic and atraumatic.  Cardiovascular: Normal rate, regular rhythm and normal heart sounds.   No murmur heard. Pulmonary/Chest: Effort normal and breath sounds normal. No respiratory distress. She has no wheezes.  Genitourinary:  Genitourinary Comments: Mild irritation noted on bilateral labia L>R  Neurological: She is alert and oriented to person, place, and time.  Skin: Skin is warm and dry.  Psychiatric: She has a normal mood and affect. Her behavior is normal. Judgment and thought content normal.          Assessment & Plan:  Vaginitis- will send wet prep to check for yeast/bacteria.  Fatigue- likely related to recent illness/hospitalization. We did discussed that her untreated OSA may be a factor too. She has declined CPAP but says that she may consider in the future.  UTI- clinically resolved. Advised pt to keep her upcoming appointment with urology.   Hyperkalemia- losartan was d/c'd and she was placed on amlodipine. Check follow up bmet.  HTN-  BP Readings from Last 3 Encounters:  04/16/16 (!) 114/92  04/07/16 139/89  03/28/16 124/69   BP is stable with med changes. Continue same.

## 2016-04-16 NOTE — Telephone Encounter (Signed)
Could you please call [facility] and see if they can get her in a little sooner?  OK to see NP if needed.

## 2016-04-17 LAB — CERVICOVAGINAL ANCILLARY ONLY: WET PREP (BD AFFIRM): NEGATIVE

## 2016-04-18 ENCOUNTER — Other Ambulatory Visit: Payer: Self-pay | Admitting: *Deleted

## 2016-04-18 MED ORDER — FLUCONAZOLE 150 MG PO TABS: ORAL_TABLET | ORAL | 0 refills | Status: DC

## 2016-04-18 NOTE — Telephone Encounter (Signed)
Received call from pt stating she continues to have intense vaginal itching. Reports that she is now applying vaseline to soothe itching. States she bathes daily and would like to know what else she could do to alleviate her symptoms?  Pt also states she did not get her message in time about urology appt and missed the appt yesterday. Gave pt number to contact Dr Zettie Pho office to see if they can r/s appt.  Please advise above concern?

## 2016-04-18 NOTE — Telephone Encounter (Signed)
Attempted to reach pt. Spouse states pt should return home in about 15 minutes. Will try again.

## 2016-04-18 NOTE — Telephone Encounter (Signed)
Notified pt and she voices understanding. Rx sent to [facility] on [address].

## 2016-04-18 NOTE — Telephone Encounter (Signed)
Please check her preferred local pharmacy. I have pended rx for diflucan.  Will try diflucan in case she has an external yeast infection. If this does not help, let me know. Next step would be a trial of vaginal estrogen cream.

## 2016-04-21 ENCOUNTER — Ambulatory Visit: Admitting: Internal Medicine

## 2016-04-21 ENCOUNTER — Telehealth: Payer: Self-pay | Admitting: Family

## 2016-04-21 DIAGNOSIS — D75839 Thrombocytosis, unspecified: Secondary | ICD-10-CM

## 2016-04-21 DIAGNOSIS — D473 Essential (hemorrhagic) thrombocythemia: Secondary | ICD-10-CM

## 2016-04-21 DIAGNOSIS — E871 Hypo-osmolality and hyponatremia: Secondary | ICD-10-CM

## 2016-04-21 NOTE — Telephone Encounter (Signed)
I would like pt to repeat cbc with diff in 2 weeks, dx thrombocytosis and bmet dx hyponatremia please.

## 2016-04-21 NOTE — Telephone Encounter (Signed)
Notified pt and scheduled lab appt for 05/05/16 at 1:30pm. Future orders entered. Pt was scheduled to see Dr Larose Kells today for dysuria. States she just completed Cipro and has had dysuria x 3 days. Scheduled pt appt with PCP for tomorrow at 12:45pm since our office is closing early due to weather and today's appt was cancelled. Please advise if any further recommendation?

## 2016-04-21 NOTE — Telephone Encounter (Signed)
No further recommendations. Will plan to see patient tomorrow.

## 2016-04-22 ENCOUNTER — Ambulatory Visit (INDEPENDENT_AMBULATORY_CARE_PROVIDER_SITE_OTHER): Payer: Medicare Other | Admitting: Family

## 2016-04-22 ENCOUNTER — Encounter: Payer: Self-pay | Admitting: Family

## 2016-04-22 VITALS — BP 139/83 | HR 91 | Resp 16 | Ht 64.0 in | Wt 182.2 lb

## 2016-04-22 DIAGNOSIS — G4733 Obstructive sleep apnea (adult) (pediatric): Secondary | ICD-10-CM

## 2016-04-22 DIAGNOSIS — I2510 Atherosclerotic heart disease of native coronary artery without angina pectoris: Secondary | ICD-10-CM | POA: Diagnosis not present

## 2016-04-22 DIAGNOSIS — E871 Hypo-osmolality and hyponatremia: Secondary | ICD-10-CM

## 2016-04-22 DIAGNOSIS — R300 Dysuria: Secondary | ICD-10-CM

## 2016-04-22 DIAGNOSIS — I2583 Coronary atherosclerosis due to lipid rich plaque: Secondary | ICD-10-CM | POA: Diagnosis not present

## 2016-04-22 LAB — POC URINALSYSI DIPSTICK (AUTOMATED)
Bilirubin, UA: NEGATIVE
Blood, UA: NEGATIVE
GLUCOSE UA: NEGATIVE
KETONES UA: NEGATIVE
Nitrite, UA: NEGATIVE
SPEC GRAV UA: 1.02
Urobilinogen, UA: 0.2
pH, UA: 6

## 2016-04-22 LAB — BASIC METABOLIC PANEL
BUN: 9 mg/dL (ref 6–23)
CO2: 26 meq/L (ref 19–32)
CREATININE: 1.1 mg/dL (ref 0.40–1.20)
Calcium: 10 mg/dL (ref 8.4–10.5)
Chloride: 98 mEq/L (ref 96–112)
GFR: 52.14 mL/min — ABNORMAL LOW (ref >60.00)
Glucose, Bld: 123 mg/dL — ABNORMAL HIGH (ref 70–99)
Potassium: 4.3 mEq/L (ref 3.5–5.1)
Sodium: 135 mEq/L (ref 135–145)

## 2016-04-22 MED ORDER — CIPROFLOXACIN HCL 250 MG PO TABS: 250.0000 mg | ORAL_TABLET | Freq: Two times a day (BID) | ORAL | 0 refills | Status: DC

## 2016-04-22 NOTE — Progress Notes (Signed)
Subjective:    Patient ID: Kimberly Lin, female    DOB: 11/08/1945, 71 y.o.   MRN: [id number]  HPI  Kimberly Lin is a 71 yr old female who presents today to with chief complaint of dysuria. She recently was treated for a mild polymicrobial UTI with with cipro.  She reports + intermittent dysuria. Occasional frequency. Denies fever.    She reports that the other night after she took her dose of ambien she rolled out of her bed.  Her granddaughter was there at the time. History obtained from husband by phone. He stated that she did not lose consciousness, but just was sedated.  Pt admitted to taking an [place] and a xanax together.    Review of Systems See HPI  Past Medical History:  Diagnosis Date  . Abdominal mass   . Abnormal nuclear stress test 07/05/2014  . Anxiety   . Arthritis    osteoarthritis  . Colitis 2014  . Depression   . Diabetes mellitus   . Fibromyalgia   . GERD (gastroesophageal reflux disease)   . Hiatal hernia   . History of chicken pox   . History of septic shock 12/2009  . Hyperlipidemia   . Hypertension   . Migraine   . OSA (obstructive sleep apnea) 03/12/2011  . Personal history of colonic polyps   . Spinal stenosis      Social History   Social History  . Marital status: Married    Spouse name: N/A  . Number of children: 2  . Years of education: N/A   Occupational History  . Not on file.   Social History Main Topics  . Smoking status: Former Smoker    Types: Cigarettes  . Smokeless tobacco: Never Used     Comment: Smoked socially.  . Alcohol use 3.6 - 4.8 oz/week    6 - 8 Glasses of wine per week  . Drug use: No  . Sexual activity: Not on file   Other Topics Concern  . Not on file   Social History Narrative   Regular exercise:  No   Caffeine use: 1 cup daily          Past Surgical History:  Procedure Laterality Date  . APPENDECTOMY  1983  . BREAST BIOPSY Right 2007, 2009  . CARDIAC CATHETERIZATION N/A 07/05/2014   Procedure:  Left Heart Cath and Coronary Angiography;  Surgeon: Sherren Mocha, MD;  Location: [facility];  Service: Cardiovascular;  Laterality: N/A;  . COLONOSCOPY  2014   normal   . MEDIAL PARTIAL KNEE REPLACEMENT Bilateral 2006  . OVARIAN CYST REMOVAL  2008   Pt states she had a ?cyst removed ?ovaries  . POLYPECTOMY  1998    Family History  Problem Relation Age of Onset  . Arthritis Mother   . Hyperlipidemia Mother   . Hypertension Mother   . Diabetes Mother   . Heart disease Mother   . Breast cancer Mother   . Lung cancer Brother   . Heart disease Brother     x 2  . Hypertension Brother   . Hyperlipidemia Brother   . Diabetes Brother   . Sudden death Father   . Lung cancer Father   . Rectal cancer Father   . Diabetes Sister   . Hyperlipidemia Sister   . Hypertension Sister   . Diabetes Maternal Aunt   . Heart attack Maternal Aunt   . Hyperlipidemia Maternal Aunt   . Hypertension Maternal Aunt   .  Hypertension Maternal Uncle   . Hyperlipidemia Maternal Uncle   . Heart attack Maternal Uncle   . Diabetes Maternal Uncle   . Diabetes Paternal Aunt   . Heart attack Paternal Aunt   . Hyperlipidemia Paternal Aunt   . Hypertension Paternal Aunt   . Hypertension Paternal Uncle   . Hyperlipidemia Paternal Uncle   . Heart attack Paternal Uncle   . Diabetes Paternal Uncle   . Prostate cancer Brother     Allergies  Allergen Reactions  . Erythromycin Swelling    "all mycins"  . Losartan     hyperkalemia  . Prednisone Other (See Comments)    "Insomnia" and "makes me crazy"    Current Outpatient Prescriptions on File Prior to Visit  Medication Sig Dispense Refill  . ALPRAZolam (XANAX) 1 MG tablet Take 1 tablet (1 mg total) by mouth 2 (two) times daily as needed for anxiety. 28 tablet 0  . amLODipine (NORVASC) 10 MG tablet Take 1 tablet (10 mg total) by mouth daily. 90 tablet 0  . aspirin 81 MG tablet Take 81 mg by mouth daily.      Marland Kitchen atorvastatin (LIPITOR) 40 MG tablet  Take 1 tablet (40 mg total) by mouth daily. 90 tablet 1  . Calcium Carbonate-Vitamin D (CALCIUM-D) 600-400 MG-UNIT TABS Take 1 tablet by mouth daily.     . Coenzyme Q10 (CO Q 10) 100 MG CAPS Take 100 mg by mouth daily.    . diphenhydrAMINE (BENADRYL) 25 MG tablet Take 25 mg by mouth every 6 (six) hours as needed for itching.     . escitalopram (LEXAPRO) 20 MG tablet Take 1 tablet (20 mg total) by mouth daily. 90 tablet 1  . esomeprazole (NEXIUM) 40 MG capsule Take 1 capsule (40 mg total) by mouth daily at 12 noon. 30 capsule 2  . gabapentin (NEURONTIN) 300 MG capsule Take 1 capsule (300 mg total) by mouth 2 (two) times daily. Reported on 03/21/2015 180 capsule 1  . liraglutide (VICTOZA) 18 MG/3ML SOPN Inject 1.8mg  once daily 9 mL 1  . Magnesium Oxide 400 (240 Mg) MG TABS Take 1 tablet by mouth daily. 30 tablet   . meclizine (ANTIVERT) 25 MG tablet Take 1 tablet (25 mg total) by mouth 3 (three) times daily as needed for dizziness. 30 tablet 0  . metFORMIN (GLUCOPHAGE) 500 MG tablet TAKE TWO TABLETS BY MOUTH TWICE DAILY WITH MEALS 120 tablet 1  . metoprolol succinate (TOPROL-XL) 50 MG 24 hr tablet Take 1 tablet (50 mg total) by mouth daily. Take with or immediately following a meal. 90 tablet 1  . Multiple Vitamin (MULTIVITAMIN) tablet Take 1 tablet by mouth daily.      . Omega-3 Fatty Acids (FISH OIL) 1000 MG CAPS 2 caps by mouth twice daily (Patient taking differently: Take 2,000 mg by mouth 2 (two) times daily. )  0  . ondansetron (ZOFRAN ODT) 8 MG disintegrating tablet Take 1 tablet (8 mg total) by mouth every 8 (eight) hours as needed for nausea or vomiting. 20 tablet 0  . pantoprazole (PROTONIX) 40 MG tablet Take 1 tablet (40 mg total) by mouth daily. 90 tablet 1  . venlafaxine XR (EFFEXOR-XR) 75 MG 24 hr capsule TAKE TWO CAPSULES BY MOUTH ONCE DAILY 180 capsule 1   No current facility-administered medications on file prior to visit.     BP 139/83 (BP Location: Right Arm, Cuff Size: Large)    Pulse 91   Resp 16   Ht 5\' 4"  (1.626  m)   Wt 182 lb 3.2 oz (82.6 kg)   SpO2 97% Comment: room air  BMI 31.27 kg/m       Objective:   Physical Exam  Constitutional: She is oriented to person, place, and time. She appears well-developed and well-nourished.  HENT:  Head: Normocephalic.  Cardiovascular: Normal rate, regular rhythm and normal heart sounds.   No murmur heard. Pulmonary/Chest: Effort normal and breath sounds normal. No respiratory distress. She has no wheezes.  Neurological: She is alert and oriented to person, place, and time.  Psychiatric: She has a normal mood and affect. Her behavior is normal. Judgment and thought content normal.          Assessment & Plan:  Dysuria- initially began cipro due to symptoms and UA findings.  However culture grew only skin contaminants.  I think that her symptoms may be more due to vaginal irritation. We discussed this. If her vaginal symptoms do not improve following diflucan, will consider estrace to treat for atrophic vaginitis. She is instructed to reschedule her appointment with urology in the meantime.   Insomnia- reports that this is not helped by the Ambien. Due to her oversedation and recent falls, I have advised her to d/c ambien.  Ideally, I would like to get her off of alprazolam too, but will see how she does off the ambien first.    Hyponatremia- repeat bmet.

## 2016-04-22 NOTE — Patient Instructions (Addendum)
Stop ambien.  Start cipro. Call if symptoms worsen or symptoms are not improved in 1 week. Reschedule your appointment with urology.

## 2016-04-22 NOTE — Progress Notes (Signed)
Pre visit review using our clinic review tool, if applicable. No additional management support is needed unless otherwise documented below in the visit note. 

## 2016-04-23 LAB — URINE CULTURE

## 2016-04-25 DIAGNOSIS — B9620 Unspecified Escherichia coli [E. coli] as the cause of diseases classified elsewhere: Secondary | ICD-10-CM | POA: Diagnosis not present

## 2016-04-25 DIAGNOSIS — N3020 Other chronic cystitis without hematuria: Secondary | ICD-10-CM | POA: Diagnosis not present

## 2016-04-25 DIAGNOSIS — R300 Dysuria: Secondary | ICD-10-CM | POA: Diagnosis not present

## 2016-04-25 DIAGNOSIS — N390 Urinary tract infection, site not specified: Secondary | ICD-10-CM | POA: Diagnosis not present

## 2016-04-29 DIAGNOSIS — M4727 Other spondylosis with radiculopathy, lumbosacral region: Secondary | ICD-10-CM | POA: Diagnosis not present

## 2016-04-29 DIAGNOSIS — M5417 Radiculopathy, lumbosacral region: Secondary | ICD-10-CM | POA: Diagnosis not present

## 2016-05-05 ENCOUNTER — Other Ambulatory Visit

## 2016-05-05 ENCOUNTER — Other Ambulatory Visit (INDEPENDENT_AMBULATORY_CARE_PROVIDER_SITE_OTHER): Payer: Medicare Other

## 2016-05-05 DIAGNOSIS — E871 Hypo-osmolality and hyponatremia: Secondary | ICD-10-CM

## 2016-05-05 DIAGNOSIS — D75839 Thrombocytosis, unspecified: Secondary | ICD-10-CM

## 2016-05-05 DIAGNOSIS — D473 Essential (hemorrhagic) thrombocythemia: Secondary | ICD-10-CM

## 2016-05-06 LAB — CBC WITH DIFFERENTIAL/PLATELET
BASOS ABS: 0.1 10*3/uL (ref 0.0–0.1)
BASOS PCT: 0.4 % (ref 0.0–3.0)
EOS PCT: 0.8 % (ref 0.0–5.0)
Eosinophils Absolute: 0.1 10*3/uL (ref 0.0–0.7)
HCT: 42.1 % (ref 36.0–46.0)
HEMOGLOBIN: 14 g/dL (ref 12.0–15.0)
LYMPHS PCT: 10.1 % — AB (ref 12.0–46.0)
Lymphs Abs: 1.6 10*3/uL (ref 0.7–4.0)
MCHC: 33.3 g/dL (ref 30.0–36.0)
MCV: 92.1 fl (ref 78.0–100.0)
MONO ABS: 0.9 10*3/uL (ref 0.1–1.0)
MONOS PCT: 5.5 % (ref 3.0–12.0)
NEUTROS PCT: 83.2 % — AB (ref 43.0–77.0)
Neutro Abs: 13.3 10*3/uL — ABNORMAL HIGH (ref 1.4–7.7)
Platelets: 268 10*3/uL (ref 150.0–400.0)
RBC: 4.57 Mil/uL (ref 3.87–5.11)
RDW: 14.9 % (ref 11.5–15.5)
WBC: 15.9 10*3/uL — AB (ref 4.0–10.5)

## 2016-05-06 LAB — BASIC METABOLIC PANEL
BUN: 27 mg/dL — ABNORMAL HIGH (ref 6–23)
CALCIUM: 9.6 mg/dL (ref 8.4–10.5)
CO2: 21 meq/L (ref 19–32)
Chloride: 100 mEq/L (ref 96–112)
Creatinine, Ser: 0.89 mg/dL (ref 0.40–1.20)
GFR: 66.57 mL/min (ref >60.00)
GLUCOSE: 150 mg/dL — AB (ref 70–99)
Potassium: 4.4 mEq/L (ref 3.5–5.1)
SODIUM: 134 meq/L — AB (ref 135–145)

## 2016-05-07 ENCOUNTER — Encounter: Payer: Self-pay | Admitting: Family

## 2016-05-14 ENCOUNTER — Telehealth: Payer: Self-pay | Admitting: Family

## 2016-05-14 ENCOUNTER — Ambulatory Visit: Admitting: Family Medicine

## 2016-05-14 DIAGNOSIS — H40023 Open angle with borderline findings, high risk, bilateral: Secondary | ICD-10-CM | POA: Diagnosis not present

## 2016-05-14 DIAGNOSIS — N390 Urinary tract infection, site not specified: Secondary | ICD-10-CM | POA: Diagnosis not present

## 2016-05-14 DIAGNOSIS — H2513 Age-related nuclear cataract, bilateral: Secondary | ICD-10-CM | POA: Diagnosis not present

## 2016-05-14 DIAGNOSIS — N3020 Other chronic cystitis without hematuria: Secondary | ICD-10-CM | POA: Diagnosis not present

## 2016-05-14 DIAGNOSIS — E119 Type 2 diabetes mellitus without complications: Secondary | ICD-10-CM | POA: Diagnosis not present

## 2016-05-14 DIAGNOSIS — H43813 Vitreous degeneration, bilateral: Secondary | ICD-10-CM | POA: Diagnosis not present

## 2016-05-14 DIAGNOSIS — H04123 Dry eye syndrome of bilateral lacrimal glands: Secondary | ICD-10-CM | POA: Diagnosis not present

## 2016-05-14 NOTE — Telephone Encounter (Signed)
Notified pt she needs to be seen in office today for UTI. Scheduled appointment today at 3:15.

## 2016-05-14 NOTE — Telephone Encounter (Signed)
Caller name: Relationship to patient: Self Can be reached: [phone] Pharmacy:  Reason for call: Patient is requesting a UA ordered for her because she thinks she has a UTI.

## 2016-05-22 ENCOUNTER — Ambulatory Visit: Admitting: Nurse Practitioner

## 2016-05-28 ENCOUNTER — Other Ambulatory Visit: Payer: Self-pay | Admitting: Family

## 2016-06-04 ENCOUNTER — Other Ambulatory Visit (INDEPENDENT_AMBULATORY_CARE_PROVIDER_SITE_OTHER): Payer: Medicare Other

## 2016-06-04 ENCOUNTER — Ambulatory Visit (INDEPENDENT_AMBULATORY_CARE_PROVIDER_SITE_OTHER): Payer: Medicare Other | Admitting: Nurse Practitioner

## 2016-06-04 ENCOUNTER — Encounter: Payer: Self-pay | Admitting: Nurse Practitioner

## 2016-06-04 VITALS — BP 120/76 | HR 77 | Ht 64.0 in | Wt 183.0 lb

## 2016-06-04 DIAGNOSIS — I2510 Atherosclerotic heart disease of native coronary artery without angina pectoris: Secondary | ICD-10-CM

## 2016-06-04 DIAGNOSIS — G8929 Other chronic pain: Secondary | ICD-10-CM

## 2016-06-04 DIAGNOSIS — I2583 Coronary atherosclerosis due to lipid rich plaque: Secondary | ICD-10-CM | POA: Diagnosis not present

## 2016-06-04 DIAGNOSIS — R140 Abdominal distension (gaseous): Secondary | ICD-10-CM | POA: Diagnosis not present

## 2016-06-04 DIAGNOSIS — R197 Diarrhea, unspecified: Secondary | ICD-10-CM

## 2016-06-04 DIAGNOSIS — R1031 Right lower quadrant pain: Secondary | ICD-10-CM

## 2016-06-04 LAB — BASIC METABOLIC PANEL
BUN: 17 mg/dL (ref 6–23)
CHLORIDE: 103 meq/L (ref 96–112)
CO2: 24 meq/L (ref 19–32)
Calcium: 9.6 mg/dL (ref 8.4–10.5)
Creatinine, Ser: 0.88 mg/dL (ref 0.40–1.20)
GFR: 67.43 mL/min (ref >60.00)
GLUCOSE: 98 mg/dL (ref 70–99)
Potassium: 4.8 mEq/L (ref 3.5–5.1)
SODIUM: 137 meq/L (ref 135–145)

## 2016-06-04 MED ORDER — BUDESONIDE 3 MG PO CPEP: 9.0000 mg | ORAL_CAPSULE | Freq: Every day | ORAL | 0 refills | Status: DC

## 2016-06-04 NOTE — Patient Instructions (Signed)
If you are age 71 or older, your body mass index should be between 23-30. Your Body mass index is 31.41 kg/m. If this is out of the aforementioned range listed, please consider follow up with your Primary Care Provider.  If you are age 76 or younger, your body mass index should be between 19-25. Your Body mass index is 31.41 kg/m. If this is out of the aformentioned range listed, please consider follow up with your Primary Care Provider.   You have been scheduled for a CT scan of the abdomen and pelvis at [facility] ([address]---this is in the same building as [facility]).   You are scheduled on 06/10/16 at 1130. You should arrive 15 minutes prior to your appointment time for registration. Please follow the written instructions below on the day of your exam:  WARNING: IF YOU ARE ALLERGIC TO IODINE/X-RAY DYE, PLEASE NOTIFY RADIOLOGY IMMEDIATELY AT [phone]! YOU WILL BE GIVEN A 13 HOUR PREMEDICATION PREP.  1) Do not eat anything after 730 am (4 hours prior to your test) 2) You have been given 2 bottles of oral contrast to drink. The solution may taste               better if refrigerated, but do NOT add ice or any other liquid to this solution. Shake             well before drinking.    Drink 1 bottle of contrast @ 930 am (2 hours prior to your exam)  Drink 1 bottle of contrast @ 1030 am (1 hour prior to your exam)  You may take any medications as prescribed with a small amount of water except for the following: Metformin, Glucophage, Glucovance, Avandamet, Riomet, Fortamet, Actoplus Met, Janumet, Glumetza or Metaglip. The above medications must be held the day of the exam AND 48 hours after the exam.  The purpose of you drinking the oral contrast is to aid in the visualization of your intestinal tract. The contrast solution may cause some diarrhea. Before your exam is started, you will be given a small amount of fluid to drink. Depending on your individual set of  symptoms, you may also receive an intravenous injection of x-ray contrast/dye. Plan on being at [facility] for 30 minutes or longer, depending on the type of exam you are having performed.  This test typically takes 30-45 minutes to complete.  If you have any questions regarding your exam or if you need to reschedule, you may call the CT department at [phone] between the hours of 8:00 am and 5:00 pm, Monday-Friday.  ________________________________________________________________________ We have sent the following medications to your pharmacy for you to pick up at your convenience: [facility] has requested that you go to the basement for the following lab work before leaving today: BMET  Follow up appointment with Dr. Carlean Purl 07/14/16 at 1130 am.  Thank you for choosing me and [facility].   Tye Savoy, NP

## 2016-06-04 NOTE — Progress Notes (Addendum)
     HPI: Patient is a 71 year old female followed by Dr. Sharlett Iles, now Dr. Carlean Purl. She has a history of collagenous colitis. She takes several Imodium week to control chronic loose stool. Patient is in today for evaluation of abdominal pain.  She was in the hospital in February with sepsis, presumed secondary to UTI. She had been having abdominal pain , dysuria and weakness. She has not felt well for months. She has generalized abdominal pain most pronounced in the right lower quadrant. The right lower quadrant pain is stabbing, intermittent. Pain not related to bowel movements or activity. Her abdomen feels swollen. No vomiting. Afebrile at home. Patient questions UTI as source of sepsis in February . Her urine culture was negative . She is concerned about this ongoing abdominal pain and new bloated feeling . She has just felt terrible for months. Stools are nonbloody but chronically loose. She has episodes of fecal incontinence which is embarrassing.   Past Medical History:  Diagnosis Date  . Abdominal mass   . Abnormal nuclear stress test 07/05/2014  . Anxiety   . Arthritis    osteoarthritis  . Colitis 2014  . Depression   . Diabetes mellitus   . Fibromyalgia   . GERD (gastroesophageal reflux disease)   . Hiatal hernia   . History of chicken pox   . History of septic shock 12/2009  . Hyperlipidemia   . Hypertension   . Migraine   . OSA (obstructive sleep apnea) 03/12/2011  . Personal history of colonic polyps   . Spinal stenosis     Patient's surgical history, family medical history, social history, medications and allergies were all reviewed in Epic    Physical Exam: Ht 5\' 4"  (1.626 m)   Wt 183 lb (83 kg)   BMI 31.41 kg/m   GENERAL: white female in NAD PSYCH: :Pleasant, cooperative, normal affect HEENT:  conjunctiva pink, mucous membranes moist, neck supple without masses CARDIAC:  RRR, no murmur heard, no peripheral edema PULM: Normal respiratory effort, lungs CTA  bilaterally, no wheezing ABDOMEN:  soft, nondistended, right periumbilical tenderness,  No obvious masses, normal bowel sounds SKIN:  turgor, no lesions seen Musculoskeletal:  Normal muscle tone, normal strength NEURO: Alert and oriented x 3, no focal neurologic deficits  ASSESSMENT and PLAN:  27. 71 year old female with hx of collagenous colitis in 2014.  Improved with course of Entocort. Doesn't sound like she was ever retreated. For last few years she has just relied to several imodium a week. Having some fecal incontinence which is stressful. She is interested in repeating a course of Entocort. She could also have underlying IBS.  -not unreasonable to repeat a course of Budesonide to she if it improves things. Start 9mg  daily. Call with condition update in a couple of weeks.   2. Two month history of intermittent stabbing RLQ pain and new bloating sensation. Admitted in Feb with sepsis, presumed UTI, urine cx negative. She is anxious about the persistent RLQ stabbing pain which is not related to meals nor defecation.  -CTscan of abd / pelvis. Check renal function first.    Tye Savoy , NP 06/04/2016, 10:58 AM   Agree with Ms. Vanita Ingles assessment and plan. CT was negative. She has not yet called [place] with update so will contact her Gatha Mayer, MD, [facility]

## 2016-06-10 ENCOUNTER — Ambulatory Visit (INDEPENDENT_AMBULATORY_CARE_PROVIDER_SITE_OTHER)
Admission: RE | Admit: 2016-06-10 | Discharge: 2016-06-10 | Disposition: A | Discharge status: Home / Self Care | Payer: Medicare Other | Source: Ambulatory Visit | Attending: Nurse Practitioner | Admitting: Nurse Practitioner

## 2016-06-10 DIAGNOSIS — R197 Diarrhea, unspecified: Secondary | ICD-10-CM

## 2016-06-10 DIAGNOSIS — R140 Abdominal distension (gaseous): Secondary | ICD-10-CM

## 2016-06-10 DIAGNOSIS — R1031 Right lower quadrant pain: Secondary | ICD-10-CM | POA: Diagnosis not present

## 2016-06-10 DIAGNOSIS — G8929 Other chronic pain: Secondary | ICD-10-CM

## 2016-06-10 MED ORDER — IOPAMIDOL (ISOVUE-300) INJECTION 61%
100.0000 mL | Freq: Once | INTRAVENOUS | Status: AC | PRN
  Administered 2016-06-10: 100 mL via INTRAVENOUS

## 2016-06-23 ENCOUNTER — Encounter: Payer: Self-pay | Admitting: Family

## 2016-06-24 ENCOUNTER — Ambulatory Visit (HOSPITAL_BASED_OUTPATIENT_CLINIC_OR_DEPARTMENT_OTHER): Payer: Medicare Other | Attending: Family | Admitting: Pulmonary Disease

## 2016-06-24 VITALS — Ht 63.5 in | Wt 182.0 lb

## 2016-06-24 DIAGNOSIS — R0902 Hypoxemia: Secondary | ICD-10-CM | POA: Diagnosis not present

## 2016-06-24 DIAGNOSIS — G4733 Obstructive sleep apnea (adult) (pediatric): Secondary | ICD-10-CM | POA: Insufficient documentation

## 2016-06-24 DIAGNOSIS — F5101 Primary insomnia: Secondary | ICD-10-CM

## 2016-06-24 NOTE — Telephone Encounter (Signed)
[name]-- Zolpidem is no longer on current med list. Rx was last sent to [facility] in January 2018.  Please advise?  Last UDS: 04/07/16 Last OV: 04/22/16 Next OV: 07/16/16  Both meds last filled for 30 days to [facility] and 90 days to [facility].  Please advise?

## 2016-06-25 NOTE — Telephone Encounter (Signed)
Kimberly Lin was d/cd on 3/18 due to falls and oversedation.  OK to send 30 day supply of xanax.

## 2016-06-26 ENCOUNTER — Telehealth: Payer: Self-pay | Admitting: Family

## 2016-06-26 DIAGNOSIS — H04123 Dry eye syndrome of bilateral lacrimal glands: Secondary | ICD-10-CM | POA: Diagnosis not present

## 2016-06-26 DIAGNOSIS — G4733 Obstructive sleep apnea (adult) (pediatric): Secondary | ICD-10-CM | POA: Diagnosis not present

## 2016-06-26 DIAGNOSIS — H40023 Open angle with borderline findings, high risk, bilateral: Secondary | ICD-10-CM | POA: Diagnosis not present

## 2016-06-26 DIAGNOSIS — H2513 Age-related nuclear cataract, bilateral: Secondary | ICD-10-CM | POA: Diagnosis not present

## 2016-06-26 DIAGNOSIS — E119 Type 2 diabetes mellitus without complications: Secondary | ICD-10-CM | POA: Diagnosis not present

## 2016-06-26 DIAGNOSIS — H43813 Vitreous degeneration, bilateral: Secondary | ICD-10-CM | POA: Diagnosis not present

## 2016-06-26 NOTE — Telephone Encounter (Signed)
Last Xanax rx was 02/29/16, #180. Is it ok to do 180 or do you only want to do 30 at mail order?

## 2016-06-26 NOTE — Telephone Encounter (Signed)
Sleep study confirms OSA.  I would recommend that she have a CPAP Titration study to help determine the best setting to treat her OSA.

## 2016-06-26 NOTE — Procedures (Signed)
Patient Name: Kimberly Lin, Kimberly Lin Date: 06/24/2016 Gender: Female D.O.B: 16-May-1945 Age (years): 37 Referring Provider: Earlie Counts Height (inches): 63 Interpreting Physician: Kara Mead MD, ABSM Weight (lbs): 182 RPSGT: Laren Everts BMI: 32 MRN: [id number] Neck Size: 16.00   CLINICAL INFORMATION Sleep Study Type: NPSG  Indication for sleep study: Diabetes, Fatigue, Hypertension, Obesity, OSA, Snoring, insomnia - falls with ambien  Epworth Sleepiness Score: 0  SLEEP STUDY TECHNIQUE As per the AASM Manual for the Scoring of Sleep and Associated Events v2.3 (April 2016) with a hypopnea requiring 4% desaturations.  The channels recorded and monitored were frontal, central and occipital EEG, electrooculogram (EOG), submentalis EMG (chin), nasal and oral airflow, thoracic and abdominal wall motion, anterior tibialis EMG, snore microphone, electrocardiogram, and pulse oximetry.  MEDICATIONS Medications self-administered by patient taken the night of the study : ZOLPIDEM TARTRATE, BENADRYL  SLEEP ARCHITECTURE The study was initiated at 10:34:30 PM and ended at 4:49:46 AM.  Sleep onset time was 144.7 minutes and the sleep efficiency was 13.9%. The total sleep time was 52.0 minutes.  Stage REM or N3 was not noted  The patient spent 44.23% of the night in stage N1 sleep, 55.77% in stage N2 sleep, 0.00% in stage N3 and 0.00% in REM.  Alpha intrusion was absent.  Supine sleep was 33.65%.  RESPIRATORY PARAMETERS The overall apnea/hypopnea index (AHI) was 17.3 per hour. There were 0 total apneas, including 0 obstructive, 0 central and 0 mixed apneas. There were 15 hypopneas and 12 RERAs.  AHI while supine was 30.9 per hour.  The mean oxygen saturation was 88.79%. The minimum SpO2 during sleep was 83.00%.  Moderate snoring was noted during this study.  CARDIAC DATA The 2 lead EKG demonstrated sinus rhythm. The mean heart rate was 75.64 beats per minute. Other EKG  findings include: PVCs.  LEG MOVEMENT DATA The total PLMS were 9 with a resulting PLMS index of 10.38. Associated arousal with leg movement index was 0.0 .  IMPRESSIONS - Mild -moderate obstructive sleep apnea occurred during this study (AHI = 17.3/h) but TST was only 52 minutes - No significant central sleep apnea occurred during this study (CAI = 0.0/h). - Prolonged sleep latency & WASO consistent with insomnia of sleep onset & maintenance or first night effect - Mild oxygen desaturation was noted during this study (Min O2 = 83.00%). - The patient snored with Moderate snoring volume. - EKG findings include PVCs. - Mild periodic limb movements of sleep occurred during the study. No significant associated arousals.   DIAGNOSIS - Obstructive Sleep Apnea (327.23 [G47.33 ICD-10]) - Nocturnal Hypoxemia (327.26 [G47.36 ICD-10])   RECOMMENDATIONS - Therapeutic CPAP titration to determine optimal pressure required to alleviate sleep disordered breathing. Alternatively , use autoCPAP 5-12 cm with small full face mask - Positional therapy avoiding supine position during sleep. - Avoid alcohol, sedatives and other CNS depressants that may worsen sleep apnea and disrupt normal sleep architecture. - Sleep hygiene should be reviewed to assess factors that may improve sleep quality. - Weight management and regular exercise should be initiated or continued if appropriate.  [Electronically signed] 06/26/2016 11:28 AM  Kara Mead MD, [place], American Board of Sleep Medicine   NPI: [id number]

## 2016-06-27 ENCOUNTER — Encounter: Payer: Self-pay | Admitting: Family

## 2016-06-27 ENCOUNTER — Telehealth: Payer: Self-pay | Admitting: Family

## 2016-06-27 DIAGNOSIS — G4733 Obstructive sleep apnea (adult) (pediatric): Secondary | ICD-10-CM

## 2016-06-27 MED ORDER — ALPRAZOLAM 1 MG PO TABS: 1.0000 mg | ORAL_TABLET | Freq: Two times a day (BID) | ORAL | 0 refills | Status: DC | PRN

## 2016-06-27 MED ORDER — ALPRAZOLAM 1 MG PO TABS
1.0000 mg | ORAL_TABLET | Freq: Two times a day (BID) | ORAL | 0 refills | Status: DC | PRN
Start: 2016-06-27 — End: 2016-06-27

## 2016-06-27 NOTE — Telephone Encounter (Signed)
Attempted to call rx to [facility] and reached recording that they are closed. Rx printed and placed on PCP desk for signature on Monday. #14 tabs called to [facility] voicemail to last pt until mail order Rx can be processed.

## 2016-06-27 NOTE — Telephone Encounter (Signed)
Please let pt know that her sleep study shows OSA. I would like her to complete a cpap titration study so we can optimize her settings.

## 2016-06-27 NOTE — Telephone Encounter (Signed)
[name]--do you know what pt is referring? I tried to get more information and I am still confused?

## 2016-06-29 NOTE — Telephone Encounter (Signed)
This is a duplicate phone note. Please see other phone note.

## 2016-06-30 MED ORDER — AMLODIPINE BESYLATE 10 MG PO TABS: 10.0000 mg | ORAL_TABLET | Freq: Every day | ORAL | 1 refills | Status: DC

## 2016-06-30 NOTE — Telephone Encounter (Signed)
Rx faxed to pharmacy. Email sent to pt.

## 2016-06-30 NOTE — Telephone Encounter (Signed)
Message sent to pt via mychart

## 2016-07-01 ENCOUNTER — Encounter: Payer: Self-pay | Admitting: Family

## 2016-07-01 DIAGNOSIS — G4733 Obstructive sleep apnea (adult) (pediatric): Secondary | ICD-10-CM

## 2016-07-14 ENCOUNTER — Telehealth: Payer: Self-pay

## 2016-07-14 ENCOUNTER — Encounter: Payer: Self-pay | Admitting: Internal Medicine

## 2016-07-14 ENCOUNTER — Ambulatory Visit (INDEPENDENT_AMBULATORY_CARE_PROVIDER_SITE_OTHER): Payer: Medicare Other | Admitting: Internal Medicine

## 2016-07-14 VITALS — BP 122/80 | HR 76 | Ht 64.0 in | Wt 188.6 lb

## 2016-07-14 DIAGNOSIS — I2583 Coronary atherosclerosis due to lipid rich plaque: Secondary | ICD-10-CM | POA: Diagnosis not present

## 2016-07-14 DIAGNOSIS — K52831 Collagenous colitis: Secondary | ICD-10-CM

## 2016-07-14 DIAGNOSIS — I2510 Atherosclerotic heart disease of native coronary artery without angina pectoris: Secondary | ICD-10-CM

## 2016-07-14 MED ORDER — BUDESONIDE 3 MG PO CPEP: 9.0000 mg | ORAL_CAPSULE | Freq: Every day | ORAL | 0 refills | Status: DC

## 2016-07-14 NOTE — Patient Instructions (Signed)
   Glad you are better. Take Entocort 9 mg x 2 Rxs - 2 months total.  Let me know if or when you get diarrhea again.  I appreciate the opportunity to care for you. Gatha Mayer, MD, [facility]

## 2016-07-14 NOTE — Progress Notes (Signed)
   Kimberly Lin 71 y.o. [birth year] [id number]  Assessment & Plan:   Encounter Diagnosis  Name Primary?  . Collagenous colitis Yes   She is better on budesonide. Will take x 2 mos total and stop and see how long she is in remission. I explained patients can do well a long tie off, or need pulse tx intermittently or chronic tx and we will sort out. Patient info provided. Side effects of budesonide explained to include but not limit bone loss, cataracts, elevated bp, etc. She is to let me know if/when she gets diarrhea again.  I appreciate the opportunity to care for this patient. Cc:O'Sullivan, Lenna Sciara, NP  Subjective:   Chief Complaint: f/u collagenous colitis  HPI  Patient saw Tye Savoy, NP 06/04/16 - hx collagenous colitis dx by bx Dr. Sharlett Iles a few yrs ago. She took budesonide x 2 mos and was better. However she did not have f/u and had not retried it. Was bloated with loose stools and using loperamide but not satisfied w/ quality of life. Now on budesonide much better. Medications, allergies, past medical history, past surgical history, family history and social history are reviewed and updated in the EMR.  Review of Systems As above  Objective:   Physical Exam BP 122/80   Pulse 76   Ht 5\' 4"  (1.626 m)   Wt 188 lb 9.6 oz (85.5 kg)   SpO2 98%   BMI 32.37 kg/m  NAD  15 minutes time spent with patient > half in counseling coordination of care

## 2016-07-14 NOTE — Telephone Encounter (Signed)
-----   Message from Gatha Mayer, MD sent at 07/12/2016  6:49 AM EDT ----- Regarding: f/u re: collagenous colitis sxs Please ask her how she is doing with diarrhea sxs on the budesonide Nevin Bloodgood Rxed in April

## 2016-07-14 NOTE — Telephone Encounter (Signed)
Patient will come in today to establish and meet Dr. Carlean Purl

## 2016-07-14 NOTE — Telephone Encounter (Signed)
Patient is doing well on budesonide.  She is still having some incontinence episodes.  She hasn't had an episode.  She has an appt with you today at 11:30.  She asks if she still needs to come in today?

## 2016-07-16 ENCOUNTER — Ambulatory Visit (INDEPENDENT_AMBULATORY_CARE_PROVIDER_SITE_OTHER): Payer: Medicare Other | Admitting: Family

## 2016-07-16 ENCOUNTER — Encounter: Payer: Self-pay | Admitting: Family

## 2016-07-16 VITALS — BP 148/79 | HR 77 | Temp 98.0°F | Resp 18 | Ht 64.0 in | Wt 185.0 lb

## 2016-07-16 DIAGNOSIS — K52831 Collagenous colitis: Secondary | ICD-10-CM

## 2016-07-16 DIAGNOSIS — D72829 Elevated white blood cell count, unspecified: Secondary | ICD-10-CM | POA: Diagnosis not present

## 2016-07-16 DIAGNOSIS — I10 Essential (primary) hypertension: Secondary | ICD-10-CM | POA: Diagnosis not present

## 2016-07-16 DIAGNOSIS — I2583 Coronary atherosclerosis due to lipid rich plaque: Secondary | ICD-10-CM

## 2016-07-16 DIAGNOSIS — I2510 Atherosclerotic heart disease of native coronary artery without angina pectoris: Secondary | ICD-10-CM

## 2016-07-16 LAB — CBC WITH DIFFERENTIAL/PLATELET
BASOS PCT: 0.5 % (ref 0.0–3.0)
Basophils Absolute: 0 10*3/uL (ref 0.0–0.1)
EOS ABS: 0.2 10*3/uL (ref 0.0–0.7)
EOS PCT: 2.1 % (ref 0.0–5.0)
HCT: 41.5 % (ref 36.0–46.0)
Hemoglobin: 14.1 g/dL (ref 12.0–15.0)
LYMPHS ABS: 1.6 10*3/uL (ref 0.7–4.0)
Lymphocytes Relative: 17.6 % (ref 12.0–46.0)
MCHC: 34.1 g/dL (ref 30.0–36.0)
MCV: 93.5 fl (ref 78.0–100.0)
MONO ABS: 0.6 10*3/uL (ref 0.1–1.0)
Monocytes Relative: 7 % (ref 3.0–12.0)
NEUTROS ABS: 6.6 10*3/uL (ref 1.4–7.7)
NEUTROS PCT: 72.8 % (ref 43.0–77.0)
PLATELETS: 219 10*3/uL (ref 150.0–400.0)
RBC: 4.44 Mil/uL (ref 3.87–5.11)
RDW: 14.3 % (ref 11.5–15.5)
WBC: 9 10*3/uL (ref 4.0–10.5)

## 2016-07-16 NOTE — Patient Instructions (Signed)
Please complete lab work prior to leaving.   

## 2016-07-17 NOTE — Progress Notes (Signed)
Subjective:    Patient ID: Kimberly Lin, female    DOB: August 18, 1945, 71 y.o.   MRN: [id number]  HPI  Kimberly Lin is a 71 yr old female who presents today for follow up.  1) HTN- notes that she has some evening ankle swelling. Not bothering her though.  BP Readings from Last 3 Encounters:  07/16/16 (!) 148/79  07/14/16 122/80  06/04/16 120/76   Of note, she tripped recently and skinned her left knee. Denies any other pain/injury.  Review of Systems See HPI  Past Medical History:  Diagnosis Date  . Abdominal mass   . Abnormal nuclear stress test 07/05/2014  . Anxiety   . Arthritis    osteoarthritis  . Colitis 2014  . Depression   . Diabetes mellitus   . Fibromyalgia   . GERD (gastroesophageal reflux disease)   . Hiatal hernia   . History of chicken pox   . History of septic shock 12/2009  . Hyperlipidemia   . Hypertension   . Migraine   . OSA (obstructive sleep apnea) 03/12/2011  . Personal history of colonic polyps   . Spinal stenosis      Social History   Social History  . Marital status: Married    Spouse name: N/A  . Number of children: 2  . Years of education: N/A   Occupational History  . Not on file.   Social History Main Topics  . Smoking status: Former Smoker    Types: Cigarettes  . Smokeless tobacco: Never Used     Comment: Smoked socially.  . Alcohol use 3.6 - 4.8 oz/week    6 - 8 Glasses of wine per week  . Drug use: No  . Sexual activity: Not on file   Other Topics Concern  . Not on file   Social History Narrative   Regular exercise:  No   Caffeine use: 1 cup daily          Past Surgical History:  Procedure Laterality Date  . APPENDECTOMY  1983  . BREAST BIOPSY Right 2007, 2009  . CARDIAC CATHETERIZATION N/A 07/05/2014   Procedure: Left Heart Cath and Coronary Angiography;  Surgeon: Sherren Mocha, MD;  Location: [facility];  Service: Cardiovascular;  Laterality: N/A;  . COLONOSCOPY  2014   normal   . MEDIAL PARTIAL  KNEE REPLACEMENT Bilateral 2006  . OVARIAN CYST REMOVAL  2008   Pt states she had a ?cyst removed ?ovaries  . POLYPECTOMY  1998    Family History  Problem Relation Age of Onset  . Arthritis Mother   . Hyperlipidemia Mother   . Hypertension Mother   . Diabetes Mother   . Heart disease Mother   . Breast cancer Mother   . Lung cancer Brother   . Heart disease Brother        x 2  . Hypertension Brother   . Hyperlipidemia Brother   . Diabetes Brother   . Sudden death Father   . Lung cancer Father   . Rectal cancer Father   . Diabetes Sister   . Hyperlipidemia Sister   . Hypertension Sister   . Diabetes Maternal Aunt   . Heart attack Maternal Aunt   . Hyperlipidemia Maternal Aunt   . Hypertension Maternal Aunt   . Hypertension Maternal Uncle   . Hyperlipidemia Maternal Uncle   . Heart attack Maternal Uncle   . Diabetes Maternal Uncle   . Diabetes Paternal Aunt   . Heart attack  Paternal Aunt   . Hyperlipidemia Paternal Aunt   . Hypertension Paternal Aunt   . Hypertension Paternal Uncle   . Hyperlipidemia Paternal Uncle   . Heart attack Paternal Uncle   . Diabetes Paternal Uncle   . Prostate cancer Brother     Allergies  Allergen Reactions  . Erythromycin Swelling    "all mycins"  . Losartan     hyperkalemia  . Prednisone Other (See Comments)    "Insomnia" and "makes me crazy"    Current Outpatient Prescriptions on File Prior to Visit  Medication Sig Dispense Refill  . ALPRAZolam (XANAX) 1 MG tablet Take 1 tablet (1 mg total) by mouth 2 (two) times daily as needed for anxiety. 14 tablet 0  . amLODipine (NORVASC) 10 MG tablet Take 1 tablet (10 mg total) by mouth daily. 90 tablet 1  . aspirin 81 MG tablet Take 81 mg by mouth daily.      Marland Kitchen atorvastatin (LIPITOR) 40 MG tablet Take 1 tablet (40 mg total) by mouth daily. 90 tablet 1  . budesonide (ENTOCORT EC) 3 MG 24 hr capsule Take 3 capsules (9 mg total) by mouth daily. Stop after 8 weeks total treatment (2 rxs) 90  capsule 0  . Calcium Carbonate-Vitamin D (CALCIUM-D) 600-400 MG-UNIT TABS Take 1 tablet by mouth daily.     . Coenzyme Q10 (CO Q 10) 100 MG CAPS Take 100 mg by mouth daily.    . diphenhydrAMINE (BENADRYL) 25 MG tablet Take 25 mg by mouth every 6 (six) hours as needed for itching.     . escitalopram (LEXAPRO) 20 MG tablet Take 1 tablet (20 mg total) by mouth daily. 90 tablet 1  . liraglutide (VICTOZA) 18 MG/3ML SOPN Inject 1.8mg  once daily 9 mL 1  . Magnesium Oxide 400 (240 Mg) MG TABS Take 1 tablet by mouth daily. 30 tablet   . meclizine (ANTIVERT) 25 MG tablet Take 1 tablet (25 mg total) by mouth 3 (three) times daily as needed for dizziness. 30 tablet 0  . metFORMIN (GLUCOPHAGE) 500 MG tablet TAKE TWO TABLETS BY MOUTH TWICE DAILY WITH MEALS 120 tablet 1  . metoprolol succinate (TOPROL-XL) 50 MG 24 hr tablet Take 1 tablet (50 mg total) by mouth daily. Take with or immediately following a meal. 90 tablet 1  . Multiple Vitamin (MULTIVITAMIN) tablet Take 1 tablet by mouth daily.      . Omega-3 Fatty Acids (FISH OIL) 1000 MG CAPS 2 caps by mouth twice daily (Patient taking differently: Take 2,000 mg by mouth 2 (two) times daily. )  0  . ondansetron (ZOFRAN) 4 MG tablet TAKE ONE TABLET BY MOUTH EVERY 8 HOURS AS NEEDED FOR NAUSEA AND VOMITING 20 tablet 0  . pantoprazole (PROTONIX) 40 MG tablet Take 1 tablet (40 mg total) by mouth daily. 90 tablet 1  . venlafaxine XR (EFFEXOR-XR) 75 MG 24 hr capsule TAKE TWO CAPSULES BY MOUTH ONCE DAILY 180 capsule 1   No current facility-administered medications on file prior to visit.     BP (!) 148/79 (BP Location: Left Arm, Cuff Size: Normal)   Pulse 77   Temp 98 F (36.7 C) (Oral)   Resp 18   Ht 5\' 4"  (1.626 m)   Wt 185 lb (83.9 kg)   SpO2 97% Comment: room air  BMI 31.76 kg/m       Objective:   Physical Exam  Constitutional: She is oriented to person, place, and time. She appears well-developed and well-nourished.  Cardiovascular: Normal  rate,  regular rhythm and normal heart sounds.   No murmur heard. Pulmonary/Chest: Effort normal and breath sounds normal. No respiratory distress. She has no wheezes.  Neurological: She is alert and oriented to person, place, and time.  Skin: Skin is warm and dry.  Left knee scab noted.   Psychiatric: She has a normal mood and affect. Her behavior is normal. Judgment and thought content normal.          Assessment & Plan:  HTN- bp is stable, continue current meds.    Leukocytosis- noted on last CBC.  CBC is repeated and is now normal.  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

## 2016-07-18 ENCOUNTER — Encounter: Payer: Self-pay | Admitting: Internal Medicine

## 2016-07-18 ENCOUNTER — Encounter: Payer: Self-pay | Admitting: Family

## 2016-07-18 DIAGNOSIS — L989 Disorder of the skin and subcutaneous tissue, unspecified: Secondary | ICD-10-CM

## 2016-07-18 NOTE — Progress Notes (Deleted)
Subjective:   Kimberly Lin is a 71 y.o. female who presents for Medicare Annual (Subsequent) preventive examination.  Review of Systems:  No ROS.  Medicare Wellness Visit.     Sleep patterns: {SX; SLEEP PATTERNS:18802::"feels rested on waking","does not get up to void","gets up *** times nightly to void","sleeps *** hours nightly"}.   Home Safety/Smoke Alarms: Feels safe in home. Smoke alarms in place.  Living environment; residence and Firearm Safety: {Rehab home environment / accessibility:30080::"no firearms","firearms stored safely"}. Seat Belt Safety/Bike Helmet: Wears seat belt.   Counseling:   Eye Exam-  Dental-  Female:   Pap- Aged out       Mammo- last 04/07/16. BI-RADS CATEGORY  1: Negative.      Dexa scan- last 12/16/10. Normal.        CCS- last 01/19/13. Normal.     Objective:     Vitals: There were no vitals taken for this visit.  There is no height or weight on file to calculate BMI.   Tobacco History  Smoking Status  . Former Smoker  . Types: Cigarettes  Smokeless Tobacco  . Never Used    Comment: Smoked socially.     Counseling given: Not Answered   Past Medical History:  Diagnosis Date  . Abdominal mass   . Abnormal nuclear stress test 07/05/2014  . Anxiety   . Arthritis    osteoarthritis  . Colitis 2014  . Depression   . Diabetes mellitus   . Fibromyalgia   . GERD (gastroesophageal reflux disease)   . Hiatal hernia   . History of chicken pox   . History of septic shock 12/2009  . Hyperlipidemia   . Hypertension   . Migraine   . OSA (obstructive sleep apnea) 03/12/2011  . Personal history of colonic polyps   . Spinal stenosis    Past Surgical History:  Procedure Laterality Date  . APPENDECTOMY  1983  . BREAST BIOPSY Right 2007, 2009  . CARDIAC CATHETERIZATION N/A 07/05/2014   Procedure: Left Heart Cath and Coronary Angiography;  Surgeon: Sherren Mocha, MD;  Location: [facility];  Service: Cardiovascular;  Laterality:  N/A;  . COLONOSCOPY  2014   normal   . MEDIAL PARTIAL KNEE REPLACEMENT Bilateral 2006  . OVARIAN CYST REMOVAL  2008   Pt states she had a ?cyst removed ?ovaries  . POLYPECTOMY  1998   Family History  Problem Relation Age of Onset  . Arthritis Mother   . Hyperlipidemia Mother   . Hypertension Mother   . Diabetes Mother   . Heart disease Mother   . Breast cancer Mother   . Lung cancer Brother   . Heart disease Brother        x 2  . Hypertension Brother   . Hyperlipidemia Brother   . Diabetes Brother   . Sudden death Father   . Lung cancer Father   . Rectal cancer Father   . Diabetes Sister   . Hyperlipidemia Sister   . Hypertension Sister   . Diabetes Maternal Aunt   . Heart attack Maternal Aunt   . Hyperlipidemia Maternal Aunt   . Hypertension Maternal Aunt   . Hypertension Maternal Uncle   . Hyperlipidemia Maternal Uncle   . Heart attack Maternal Uncle   . Diabetes Maternal Uncle   . Diabetes Paternal Aunt   . Heart attack Paternal Aunt   . Hyperlipidemia Paternal Aunt   . Hypertension Paternal Aunt   . Hypertension Paternal Uncle   .  Hyperlipidemia Paternal Uncle   . Heart attack Paternal Uncle   . Diabetes Paternal Uncle   . Prostate cancer Brother    History  Sexual Activity  . Sexual activity: Not on file    Outpatient Encounter Prescriptions as of 07/21/2016  Medication Sig  . ALPRAZolam (XANAX) 1 MG tablet Take 1 tablet (1 mg total) by mouth 2 (two) times daily as needed for anxiety.  Marland Kitchen amLODipine (NORVASC) 10 MG tablet Take 1 tablet (10 mg total) by mouth daily.  Marland Kitchen aspirin 81 MG tablet Take 81 mg by mouth daily.    Marland Kitchen atorvastatin (LIPITOR) 40 MG tablet Take 1 tablet (40 mg total) by mouth daily.  . budesonide (ENTOCORT EC) 3 MG 24 hr capsule Take 3 capsules (9 mg total) by mouth daily. Stop after 8 weeks total treatment (2 rxs)  . Calcium Carbonate-Vitamin D (CALCIUM-D) 600-400 MG-UNIT TABS Take 1 tablet by mouth daily.   . Coenzyme Q10 (CO Q 10) 100  MG CAPS Take 100 mg by mouth daily.  . diphenhydrAMINE (BENADRYL) 25 MG tablet Take 25 mg by mouth every 6 (six) hours as needed for itching.   . escitalopram (LEXAPRO) 20 MG tablet Take 1 tablet (20 mg total) by mouth daily.  Marland Kitchen liraglutide (VICTOZA) 18 MG/3ML SOPN Inject 1.8mg  once daily  . Magnesium Oxide 400 (240 Mg) MG TABS Take 1 tablet by mouth daily.  . meclizine (ANTIVERT) 25 MG tablet Take 1 tablet (25 mg total) by mouth 3 (three) times daily as needed for dizziness.  . metFORMIN (GLUCOPHAGE) 500 MG tablet TAKE TWO TABLETS BY MOUTH TWICE DAILY WITH MEALS  . metoprolol succinate (TOPROL-XL) 50 MG 24 hr tablet Take 1 tablet (50 mg total) by mouth daily. Take with or immediately following a meal.  . Multiple Vitamin (MULTIVITAMIN) tablet Take 1 tablet by mouth daily.    . Omega-3 Fatty Acids (FISH OIL) 1000 MG CAPS 2 caps by mouth twice daily (Patient taking differently: Take 2,000 mg by mouth 2 (two) times daily. )  . ondansetron (ZOFRAN) 4 MG tablet TAKE ONE TABLET BY MOUTH EVERY 8 HOURS AS NEEDED FOR NAUSEA AND VOMITING  . pantoprazole (PROTONIX) 40 MG tablet Take 1 tablet (40 mg total) by mouth daily.  Marland Kitchen venlafaxine XR (EFFEXOR-XR) 75 MG 24 hr capsule TAKE TWO CAPSULES BY MOUTH ONCE DAILY   No facility-administered encounter medications on file as of 07/21/2016.     Activities of Daily Living In your present state of health, do you have any difficulty performing the following activities: 03/26/2016  Hearing? N  Vision? N  Difficulty concentrating or making decisions? Y  Walking or climbing stairs? Y  Dressing or bathing? Y  Doing errands, shopping? Y  Some recent data might be hidden    Patient Care Team: Debbrah Alar, NP as PCP - General (Internal Medicine)    Assessment:    Physical assessment deferred to PCP.  Exercise Activities and Dietary recommendations    Diet (meal preparation, eat out, water intake, caffeinated beverages, dairy products, fruits and  vegetables): {Desc; diets:16563} Breakfast: Lunch:  Dinner:      Goals    None     Fall Risk Fall Risk  07/16/2016 09/14/2015 03/24/2014 10/15/2012 10/15/2012  Falls in the past year? Yes Yes Yes - Yes  Number falls in past yr: 1 2 or more 2 or more 2 or more 2 or more  Injury with Fall? Yes Yes Yes - -  Risk Factor Category  - (No  Data) High Fall Risk High Fall Risk High Fall Risk  Risk for fall due to : - History of fall(s) Impaired balance/gait History of fall(s) -   Depression Screen PHQ 2/9 Scores 09/14/2015 03/24/2014 10/15/2012  PHQ - 2 Score 6 0 0  PHQ- 9 Score 12 - -     Cognitive Function        Immunization History  Administered Date(s) Administered  . Influenza Split 12/13/2010, 11/24/2011  . Influenza, High Dose Seasonal PF 11/15/2014  . Influenza,inj,Quad PF,36+ Mos 10/15/2012, 11/08/2013, 11/07/2015  . Pneumococcal Conjugate-13 02/10/2009  . Pneumococcal Polysaccharide-23 01/02/2014  . Tdap 06/15/2012  . Zoster 12/13/2010   Screening Tests Health Maintenance  Topic Date Due  . URINE MICROALBUMIN  03/25/2015  . HEMOGLOBIN A1C  07/16/2016  . INFLUENZA VACCINE  09/10/2016  . OPHTHALMOLOGY EXAM  10/21/2016  . FOOT EXAM  01/15/2017  . MAMMOGRAM  04/07/2018  . TETANUS/TDAP  06/16/2022  . COLONOSCOPY  01/20/2023  . DEXA SCAN  Completed  . Hepatitis C Screening  Completed  . PNA vac Low Risk Adult  Completed      Plan:    Follow-up w/ PCP as scheduled.   I have personally reviewed and noted the following in the patient's chart:   . Medical and social history . Use of alcohol, tobacco or illicit drugs  . Current medications and supplements . Functional ability and status . Nutritional status . Physical activity . Advanced directives . List of other physicians . Vitals . Screenings to include cognitive, depression, and falls . Referrals and appointments  In addition, I have reviewed and discussed with patient certain preventive protocols, quality metrics,  and best practice recommendations. A written personalized care plan for preventive services as well as general preventive health recommendations were provided to patient.     Dorrene German, RN  07/18/2016

## 2016-07-21 ENCOUNTER — Ambulatory Visit: Admitting: *Deleted

## 2016-07-22 ENCOUNTER — Encounter: Payer: Self-pay | Admitting: Family

## 2016-07-31 DIAGNOSIS — M4727 Other spondylosis with radiculopathy, lumbosacral region: Secondary | ICD-10-CM | POA: Diagnosis not present

## 2016-07-31 DIAGNOSIS — M5417 Radiculopathy, lumbosacral region: Secondary | ICD-10-CM | POA: Diagnosis not present

## 2016-08-04 DIAGNOSIS — N3020 Other chronic cystitis without hematuria: Secondary | ICD-10-CM | POA: Diagnosis not present

## 2016-08-06 ENCOUNTER — Encounter: Payer: Self-pay | Admitting: Family

## 2016-08-06 DIAGNOSIS — H40023 Open angle with borderline findings, high risk, bilateral: Secondary | ICD-10-CM | POA: Diagnosis not present

## 2016-08-06 DIAGNOSIS — H04123 Dry eye syndrome of bilateral lacrimal glands: Secondary | ICD-10-CM | POA: Diagnosis not present

## 2016-08-19 ENCOUNTER — Encounter: Payer: Self-pay | Admitting: Family

## 2016-08-19 MED ORDER — ALPRAZOLAM 1 MG PO TABS: 1.0000 mg | ORAL_TABLET | Freq: Two times a day (BID) | ORAL | 0 refills | Status: DC | PRN

## 2016-08-19 NOTE — Telephone Encounter (Addendum)
Rx was faxed to [facility] on 08/19/16 at 2:32PM.  Last alprazolam RX: 06/27/16, #60 Last OV: 07/16/16 Next OV: due 10/16/16 UDS: 04/07/16 and due 07/05/16.   Rx printed and forwarded to PCP for signature.  Email will be sent to pt to come by office and Provide urine sample for UDS.

## 2016-08-20 ENCOUNTER — Encounter: Payer: Self-pay | Admitting: Family

## 2016-08-20 MED ORDER — ALPRAZOLAM 1 MG PO TABS: 1.0000 mg | ORAL_TABLET | Freq: Two times a day (BID) | ORAL | 0 refills | Status: DC | PRN

## 2016-08-20 NOTE — Telephone Encounter (Signed)
[name]-- we just sent in #60 tabs with no refills to [facility]. Do you want to do another one with 5 refills like pt is requesting?  I have also called in the 2 week supply locally.

## 2016-08-20 NOTE — Telephone Encounter (Signed)
See additional email from 08/19/16.

## 2016-08-21 MED ORDER — ALPRAZOLAM 1 MG PO TABS
1.0000 mg | ORAL_TABLET | Freq: Two times a day (BID) | ORAL | 5 refills | Status: DC | PRN
Start: 2016-08-21 — End: 2016-10-10

## 2016-08-21 NOTE — Telephone Encounter (Signed)
Rx faxed and message sent to pt. 

## 2016-08-21 NOTE — Telephone Encounter (Signed)
Per verbal from PCP, ok to give additional refills since [facility] has such long delays in processing / mailing medications.  Per verbal from [facility], new Rx must be submitted. Rx printed and forwarded to PCP for signature.

## 2016-08-28 ENCOUNTER — Encounter: Payer: Self-pay | Admitting: Family

## 2016-08-29 DIAGNOSIS — I10 Essential (primary) hypertension: Secondary | ICD-10-CM | POA: Diagnosis not present

## 2016-08-29 DIAGNOSIS — M545 Low back pain: Secondary | ICD-10-CM | POA: Diagnosis not present

## 2016-08-29 DIAGNOSIS — M4727 Other spondylosis with radiculopathy, lumbosacral region: Secondary | ICD-10-CM | POA: Diagnosis not present

## 2016-08-29 DIAGNOSIS — Z6833 Body mass index (BMI) 33.0-33.9, adult: Secondary | ICD-10-CM | POA: Diagnosis not present

## 2016-09-01 ENCOUNTER — Encounter: Payer: Self-pay | Admitting: Family

## 2016-09-04 DIAGNOSIS — L821 Other seborrheic keratosis: Secondary | ICD-10-CM | POA: Diagnosis not present

## 2016-09-04 DIAGNOSIS — R21 Rash and other nonspecific skin eruption: Secondary | ICD-10-CM | POA: Diagnosis not present

## 2016-09-08 ENCOUNTER — Ambulatory Visit (HOSPITAL_BASED_OUTPATIENT_CLINIC_OR_DEPARTMENT_OTHER): Payer: Medicare Other | Attending: Family | Admitting: Pulmonary Disease

## 2016-09-08 DIAGNOSIS — G4733 Obstructive sleep apnea (adult) (pediatric): Secondary | ICD-10-CM | POA: Diagnosis not present

## 2016-09-11 ENCOUNTER — Encounter: Payer: Self-pay | Admitting: Family

## 2016-09-11 ENCOUNTER — Other Ambulatory Visit: Payer: Self-pay | Admitting: Emergency Medicine

## 2016-09-11 MED ORDER — METFORMIN HCL 500 MG PO TABS: ORAL_TABLET | ORAL | 1 refills | Status: DC

## 2016-09-12 DIAGNOSIS — G4733 Obstructive sleep apnea (adult) (pediatric): Secondary | ICD-10-CM | POA: Diagnosis not present

## 2016-09-12 NOTE — Procedures (Signed)
Patient Name: Kimberly Lin, Kimberly Lin Date: 09/08/2016 Gender: Female D.O.B: 1945/12/07 Age (years): 32 Referring Provider: Earlie Counts Height (inches): 64 Interpreting Physician: Kara Mead MD, ABSM Weight (lbs): 182 RPSGT: Earney Hamburg BMI: 32 MRN: [id number] Neck Size: 16.00   CLINICAL INFORMATION The patient is referred for a CPAP titration to treat sleep apnea.  Date of NPSG: 06/2016, AHI 17/h, RDI 31/h  SLEEP STUDY TECHNIQUE As per the AASM Manual for the Scoring of Sleep and Associated Events v2.3 (April 2016) with a hypopnea requiring 4% desaturations.  The channels recorded and monitored were frontal, central and occipital EEG, electrooculogram (EOG), submentalis EMG (chin), nasal and oral airflow, thoracic and abdominal wall motion, anterior tibialis EMG, snore microphone, electrocardiogram, and pulse oximetry. Continuous positive airway pressure (CPAP) was initiated at the beginning of the study and titrated to treat sleep-disordered breathing.  MEDICATIONS Medications self-administered by patient taken the night of the study : FLEXERIL, Duanne Moron  RESPIRATORY PARAMETERS Optimal PAP Pressure (cm): 11 AHI at Optimal Pressure (/hr): 1.1 Overall Minimal O2 (%): 86.00 Supine % at Optimal Pressure (%): 32 Minimal O2 at Optimal Pressure (%): 88.0     SLEEP ARCHITECTURE The study was initiated at 11:01:32 PM and ended at 5:19:31 AM.  Sleep onset time was 52.9 minutes and the sleep efficiency was 73.4%. The total sleep time was 277.6 minutes.  The patient spent 2.70% of the night in stage N1 sleep, 94.24% in stage N2 sleep, 0.00% in stage N3 and 3.06% in REM.Stage REM latency was 221.5 minutes  Wake after sleep onset was 47.5. Alpha intrusion was absent. Supine sleep was 40.57%.  CARDIAC DATA The 2 lead EKG demonstrated sinus rhythm. The mean heart rate was 46.64 beats per minute. Other EKG findings include: PVCs.  LEG MOVEMENT DATA The total Periodic Limb  Movements of Sleep (PLMS) were 27. The PLMS index was 5.84. A PLMS index of <15 is considered normal in adults.  IMPRESSIONS - The optimal PAP pressure was 11 cm of water. - Central sleep apnea was not noted during this titration (CAI = 0.0/h). - Severe oxygen desaturations were observed during this titration (min O2 = 86.00%). - The patient snored with Soft snoring volume during this titration study. - 2-lead EKG demonstrated: PVCs - Mild periodic limb movements were observed during this study. Arousals associated with PLMs were rare.   DIAGNOSIS - Obstructive Sleep Apnea (327.23 [G47.33 ICD-10])   RECOMMENDATIONS - Trial of CPAP therapy on 11 cm H2O with a Small size Fisher&Paykel Full Face Mask Simplus mask and heated humidification. - Avoid alcohol, sedatives and other CNS depressants that may worsen sleep apnea and disrupt normal sleep architecture. - Sleep hygiene should be reviewed to assess factors that may improve sleep quality. - Weight management and regular exercise should be initiated or continued. - Return to Sleep Center for re-evaluation after 4 weeks of therapy    Kara Mead MD Board Certified in [place]

## 2016-09-15 ENCOUNTER — Encounter: Payer: Self-pay | Admitting: Family

## 2016-09-16 ENCOUNTER — Telehealth: Payer: Self-pay | Admitting: Internal Medicine

## 2016-09-16 ENCOUNTER — Telehealth: Payer: Self-pay | Admitting: Family

## 2016-09-16 DIAGNOSIS — G4733 Obstructive sleep apnea (adult) (pediatric): Secondary | ICD-10-CM

## 2016-09-16 NOTE — Telephone Encounter (Signed)
Please contact patient and let her know that her sleep study confirms sleep apnea. I am going to set up a CPAP to be delivered to her house. I would like her to start wearing nightly and follow-up with me 4 weeks after she gets her CPAP machine.  She should ensure 8 hours/sleep a night, avoid alcohol as it can worsen sleep apnea.

## 2016-09-16 NOTE — Telephone Encounter (Signed)
Left message for [place] to call me back.  In Dr. Celesta Aver last note he said take the Budesonide for 2 months and stop. I need an update on how she's doing.

## 2016-09-18 NOTE — Telephone Encounter (Signed)
Called and left a message for call back  

## 2016-09-18 NOTE — Telephone Encounter (Signed)
Patient returned call, informed patient of NP instructions, patient voice understanding.

## 2016-09-19 ENCOUNTER — Encounter: Payer: Self-pay | Admitting: Family

## 2016-09-19 ENCOUNTER — Ambulatory Visit (INDEPENDENT_AMBULATORY_CARE_PROVIDER_SITE_OTHER): Payer: Medicare Other | Admitting: Family

## 2016-09-19 VITALS — BP 128/80 | HR 110 | Temp 97.7°F | Resp 18 | Ht 64.0 in | Wt 189.4 lb

## 2016-09-19 DIAGNOSIS — M79606 Pain in leg, unspecified: Secondary | ICD-10-CM

## 2016-09-19 DIAGNOSIS — I2583 Coronary atherosclerosis due to lipid rich plaque: Secondary | ICD-10-CM | POA: Diagnosis not present

## 2016-09-19 DIAGNOSIS — M791 Myalgia, unspecified site: Secondary | ICD-10-CM

## 2016-09-19 DIAGNOSIS — I10 Essential (primary) hypertension: Secondary | ICD-10-CM | POA: Diagnosis not present

## 2016-09-19 DIAGNOSIS — R252 Cramp and spasm: Secondary | ICD-10-CM | POA: Diagnosis not present

## 2016-09-19 DIAGNOSIS — I2510 Atherosclerotic heart disease of native coronary artery without angina pectoris: Secondary | ICD-10-CM | POA: Diagnosis not present

## 2016-09-19 LAB — BASIC METABOLIC PANEL
BUN: 12 mg/dL (ref 6–23)
CALCIUM: 9.1 mg/dL (ref 8.4–10.5)
CO2: 31 meq/L (ref 19–32)
Chloride: 99 mEq/L (ref 96–112)
Creatinine, Ser: 0.75 mg/dL (ref 0.40–1.20)
GFR: 81.02 mL/min (ref >60.00)
Glucose, Bld: 159 mg/dL — ABNORMAL HIGH (ref 70–99)
Potassium: 3.6 mEq/L (ref 3.5–5.1)
SODIUM: 136 meq/L (ref 135–145)

## 2016-09-19 LAB — MAGNESIUM: Magnesium: 1.9 mg/dL (ref 1.5–2.5)

## 2016-09-19 MED ORDER — GABAPENTIN 300 MG PO CAPS: 300.0000 mg | ORAL_CAPSULE | Freq: Three times a day (TID) | ORAL | 1 refills | Status: DC

## 2016-09-19 MED ORDER — LIRAGLUTIDE 18 MG/3ML ~~LOC~~ SOPN: PEN_INJECTOR | SUBCUTANEOUS | 1 refills | Status: DC

## 2016-09-19 MED ORDER — ESCITALOPRAM OXALATE 20 MG PO TABS: 20.0000 mg | ORAL_TABLET | Freq: Every day | ORAL | 1 refills | Status: DC

## 2016-09-19 MED ORDER — AMLODIPINE BESYLATE 10 MG PO TABS: 10.0000 mg | ORAL_TABLET | Freq: Every day | ORAL | 1 refills | Status: DC

## 2016-09-19 MED ORDER — METFORMIN HCL 500 MG PO TABS: ORAL_TABLET | ORAL | 1 refills | Status: DC

## 2016-09-19 MED ORDER — METOPROLOL SUCCINATE ER 50 MG PO TB24: 50.0000 mg | ORAL_TABLET | Freq: Every day | ORAL | 1 refills | Status: DC

## 2016-09-19 MED ORDER — PANTOPRAZOLE SODIUM 40 MG PO TBEC: 40.0000 mg | DELAYED_RELEASE_TABLET | Freq: Every day | ORAL | 1 refills | Status: DC

## 2016-09-19 MED ORDER — ATORVASTATIN CALCIUM 40 MG PO TABS: 40.0000 mg | ORAL_TABLET | Freq: Every day | ORAL | 1 refills | Status: DC

## 2016-09-19 NOTE — Telephone Encounter (Signed)
Left messages on both the cell and home #'s to call me back.

## 2016-09-19 NOTE — Progress Notes (Signed)
Subjective:    Patient ID: Kimberly Lin, female    DOB: 1945-10-30, 71 y.o.   MRN: [id number]  HPI   Kimberly Lin is a 71 year old female who presents today to discuss pain and cramping in her legs. She reports pain is severe and burning in nature. Pain presents below the knee and has been waking her up at night despite addition of oral magnesium tablets.  Has "circling pain" around her ankles at night.    HTN- current blood pressure medications include Toprol-XL 50 mg once daily, amlodipine 10 mg once daily BP Readings from Last 3 Encounters:  09/19/16 (!) 154/91  07/16/16 (!) 148/79  07/14/16 122/80    Reports some tenderness overlying left anterior rib cage.  No injury.  Review of Systems See HPI  Past Medical History:  Diagnosis Date  . Abdominal mass   . Abnormal nuclear stress test 07/05/2014  . Anxiety   . Arthritis    osteoarthritis  . Colitis 2014  . Depression   . Diabetes mellitus   . Fibromyalgia   . GERD (gastroesophageal reflux disease)   . Hiatal hernia   . History of chicken pox   . History of septic shock 12/2009  . Hyperlipidemia   . Hypertension   . Migraine   . OSA (obstructive sleep apnea) 03/12/2011  . Personal history of colonic polyps   . Spinal stenosis      Social History   Social History  . Marital status: Married    Spouse name: N/A  . Number of children: 2  . Years of education: N/A   Occupational History  . Not on file.   Social History Main Topics  . Smoking status: Former Smoker    Types: Cigarettes  . Smokeless tobacco: Never Used     Comment: Smoked socially.  . Alcohol use 3.6 - 4.8 oz/week    6 - 8 Glasses of wine per week  . Drug use: No  . Sexual activity: Not on file   Other Topics Concern  . Not on file   Social History Narrative   Regular exercise:  No   Caffeine use: 1 cup daily          Past Surgical History:  Procedure Laterality Date  . APPENDECTOMY  1983  . BREAST BIOPSY Right 2007, 2009  .  CARDIAC CATHETERIZATION N/A 07/05/2014   Procedure: Left Heart Cath and Coronary Angiography;  Surgeon: Sherren Mocha, MD;  Location: [place] [facility];  Service: Cardiovascular;  Laterality: N/A;  . COLONOSCOPY  2014   normal   . MEDIAL PARTIAL KNEE REPLACEMENT Bilateral 2006  . OVARIAN CYST REMOVAL  2008   Pt states she had a ?cyst removed ?ovaries  . POLYPECTOMY  1998    Family History  Problem Relation Age of Onset  . Arthritis Mother   . Hyperlipidemia Mother   . Hypertension Mother   . Diabetes Mother   . Heart disease Mother   . Breast cancer Mother   . Lung cancer Brother   . Heart disease Brother        x 2  . Hypertension Brother   . Hyperlipidemia Brother   . Diabetes Brother   . Sudden death Father   . Lung cancer Father   . Rectal cancer Father   . Diabetes Sister   . Hyperlipidemia Sister   . Hypertension Sister   . Diabetes Maternal Aunt   . Heart attack Maternal Aunt   . Hyperlipidemia Maternal  Aunt   . Hypertension Maternal Aunt   . Hypertension Maternal Uncle   . Hyperlipidemia Maternal Uncle   . Heart attack Maternal Uncle   . Diabetes Maternal Uncle   . Diabetes Paternal Aunt   . Heart attack Paternal Aunt   . Hyperlipidemia Paternal Aunt   . Hypertension Paternal Aunt   . Hypertension Paternal Uncle   . Hyperlipidemia Paternal Uncle   . Heart attack Paternal Uncle   . Diabetes Paternal Uncle   . Prostate cancer Brother     Allergies  Allergen Reactions  . Erythromycin Swelling    "all mycins"  . Losartan     hyperkalemia  . Prednisone Other (See Comments)    "Insomnia" and "makes me crazy"    Current Outpatient Prescriptions on File Prior to Visit  Medication Sig Dispense Refill  . ALPRAZolam (XANAX) 1 MG tablet Take 1 tablet (1 mg total) by mouth 2 (two) times daily as needed for anxiety. 60 tablet 5  . amLODipine (NORVASC) 10 MG tablet Take 1 tablet (10 mg total) by mouth daily. 90 tablet 1  . aspirin 81 MG tablet Take 81 mg by  mouth daily.      Marland Kitchen atorvastatin (LIPITOR) 40 MG tablet Take 1 tablet (40 mg total) by mouth daily. 90 tablet 1  . budesonide (ENTOCORT EC) 3 MG 24 hr capsule Take 3 capsules (9 mg total) by mouth daily. Stop after 8 weeks total treatment (2 rxs) 90 capsule 0  . Calcium Carbonate-Vitamin D (CALCIUM-D) 600-400 MG-UNIT TABS Take 1 tablet by mouth daily.     . Coenzyme Q10 (CO Q 10) 100 MG CAPS Take 100 mg by mouth daily.    . diphenhydrAMINE (BENADRYL) 25 MG tablet Take 25 mg by mouth every 6 (six) hours as needed for itching.     . escitalopram (LEXAPRO) 20 MG tablet Take 1 tablet (20 mg total) by mouth daily. 90 tablet 1  . liraglutide (VICTOZA) 18 MG/3ML SOPN Inject 1.8mg  once daily 9 mL 1  . Magnesium Oxide 400 (240 Mg) MG TABS Take 1 tablet by mouth daily. 30 tablet   . meclizine (ANTIVERT) 25 MG tablet Take 1 tablet (25 mg total) by mouth 3 (three) times daily as needed for dizziness. 30 tablet 0  . metFORMIN (GLUCOPHAGE) 500 MG tablet TAKE TWO TABLETS BY MOUTH TWICE DAILY WITH MEALS 120 tablet 1  . metoprolol succinate (TOPROL-XL) 50 MG 24 hr tablet Take 1 tablet (50 mg total) by mouth daily. Take with or immediately following a meal. 90 tablet 1  . Multiple Vitamin (MULTIVITAMIN) tablet Take 1 tablet by mouth daily.      . Omega-3 Fatty Acids (FISH OIL) 1000 MG CAPS 2 caps by mouth twice daily (Patient taking differently: Take 2,000 mg by mouth 2 (two) times daily. )  0  . ondansetron (ZOFRAN) 4 MG tablet TAKE ONE TABLET BY MOUTH EVERY 8 HOURS AS NEEDED FOR NAUSEA AND VOMITING 20 tablet 0  . pantoprazole (PROTONIX) 40 MG tablet Take 1 tablet (40 mg total) by mouth daily. 90 tablet 1  . venlafaxine XR (EFFEXOR-XR) 75 MG 24 hr capsule TAKE TWO CAPSULES BY MOUTH ONCE DAILY 180 capsule 1   No current facility-administered medications on file prior to visit.     BP (!) 154/91 (BP Location: Right Arm, Cuff Size: Normal)   Pulse (!) 110   Temp 97.7 F (36.5 C) (Oral)   Resp 18   Ht 5\' 4"   (1.626 m)  Wt 189 lb 6.4 oz (85.9 kg)   SpO2 98%   BMI 32.51 kg/m       Objective:   Physical Exam  Constitutional: She appears well-developed and well-nourished.  Cardiovascular: Normal rate, regular rhythm and normal heart sounds.   No murmur heard. Pulses:      Dorsalis pedis pulses are 2+ on the right side, and 2+ on the left side.       Posterior tibial pulses are 2+ on the right side, and 2+ on the left side.  Pulmonary/Chest: Effort normal and breath sounds normal. No respiratory distress. She has no wheezes.  Psychiatric: She has a normal mood and affect. Her behavior is normal. Judgment and thought content normal.  musculoskeletal:  + mild tenderness to palpation overlying left anterior rib cage.         Assessment & Plan:  Leg pain-suspect due to neuropathy. Check mag, bmet, trial of HS gabapentin.  Follow up in 3 months.   HTN-  Follow up BP is improved. Continue current meds.  BP Readings from Last 3 Encounters:  09/19/16 128/80  07/16/16 (!) 148/79  07/14/16 122/80   Musculoskeletal pain- advised trial of prn tylenol. She is advised to let me know if pain worsens or fails to improve.

## 2016-09-19 NOTE — Patient Instructions (Signed)
Please complete lab work prior to leaving. Start gabapentin at bedtime for your leg pain.

## 2016-09-22 MED ORDER — BUDESONIDE 3 MG PO CPEP: 9.0000 mg | ORAL_CAPSULE | Freq: Every day | ORAL | 0 refills | Status: DC

## 2016-09-22 NOTE — Telephone Encounter (Signed)
Patient needed a refill because has only taken one month's worth.  Sent in another months worth of the Entocort and she will call back to let us know how she's doing after that.

## 2016-09-22 NOTE — Telephone Encounter (Signed)
Pt is returning your call [phone]

## 2016-09-30 ENCOUNTER — Telehealth: Payer: Self-pay | Admitting: *Deleted

## 2016-09-30 NOTE — Telephone Encounter (Signed)
Received Physician Orders from [facility]; forwarded to provider/SLS 08/21

## 2016-10-08 ENCOUNTER — Encounter: Payer: Self-pay | Admitting: Family

## 2016-10-10 ENCOUNTER — Encounter: Payer: Self-pay | Admitting: Family

## 2016-10-10 MED ORDER — ALPRAZOLAM 1 MG PO TABS: 1.0000 mg | ORAL_TABLET | Freq: Two times a day (BID) | ORAL | 0 refills | Status: DC | PRN

## 2016-10-10 NOTE — Telephone Encounter (Signed)
I think we put 5 refills on her rx to [facility], correct?  OK to send 14 day supply to her local pharmacy please.

## 2016-10-10 NOTE — Telephone Encounter (Signed)
[name]-- it is ok to give 2 week supply to Kimberly Lin?

## 2016-10-10 NOTE — Telephone Encounter (Signed)
2 week supply called to May at [facility]. Email sent to pt.

## 2016-10-10 NOTE — Telephone Encounter (Signed)
See previous email from today.

## 2016-10-14 ENCOUNTER — Encounter: Payer: Self-pay | Admitting: Internal Medicine

## 2016-10-15 ENCOUNTER — Other Ambulatory Visit: Payer: Self-pay | Admitting: Internal Medicine

## 2016-10-15 ENCOUNTER — Encounter: Payer: Self-pay | Admitting: Internal Medicine

## 2016-10-15 MED ORDER — DIPHENOXYLATE-ATROPINE 2.5-0.025 MG PO TABS: 1.0000 | ORAL_TABLET | Freq: Four times a day (QID) | ORAL | 0 refills | Status: DC | PRN

## 2016-10-16 ENCOUNTER — Other Ambulatory Visit: Payer: Medicare Other

## 2016-10-16 ENCOUNTER — Other Ambulatory Visit: Payer: Self-pay

## 2016-10-16 DIAGNOSIS — R197 Diarrhea, unspecified: Secondary | ICD-10-CM

## 2016-10-16 NOTE — Progress Notes (Signed)
c diff

## 2016-10-17 ENCOUNTER — Other Ambulatory Visit: Payer: Medicare Other

## 2016-10-17 DIAGNOSIS — R197 Diarrhea, unspecified: Secondary | ICD-10-CM | POA: Diagnosis not present

## 2016-10-18 ENCOUNTER — Emergency Department (HOSPITAL_BASED_OUTPATIENT_CLINIC_OR_DEPARTMENT_OTHER): Payer: Medicare Other

## 2016-10-18 ENCOUNTER — Emergency Department (HOSPITAL_BASED_OUTPATIENT_CLINIC_OR_DEPARTMENT_OTHER)
Admission: EM | Admit: 2016-10-18 | Discharge: 2016-10-19 | Disposition: A | Discharge status: Home / Self Care | Payer: Medicare Other | Attending: Emergency Medicine | Admitting: Emergency Medicine

## 2016-10-18 ENCOUNTER — Encounter (HOSPITAL_BASED_OUTPATIENT_CLINIC_OR_DEPARTMENT_OTHER): Payer: Self-pay | Admitting: Emergency Medicine

## 2016-10-18 DIAGNOSIS — S43011A Anterior subluxation of right humerus, initial encounter: Secondary | ICD-10-CM | POA: Diagnosis not present

## 2016-10-18 DIAGNOSIS — Z7982 Long term (current) use of aspirin: Secondary | ICD-10-CM | POA: Diagnosis not present

## 2016-10-18 DIAGNOSIS — W0110XA Fall on same level from slipping, tripping and stumbling with subsequent striking against unspecified object, initial encounter: Secondary | ICD-10-CM | POA: Diagnosis not present

## 2016-10-18 DIAGNOSIS — I10 Essential (primary) hypertension: Secondary | ICD-10-CM | POA: Diagnosis not present

## 2016-10-18 DIAGNOSIS — Y999 Unspecified external cause status: Secondary | ICD-10-CM | POA: Insufficient documentation

## 2016-10-18 DIAGNOSIS — Z87891 Personal history of nicotine dependence: Secondary | ICD-10-CM | POA: Insufficient documentation

## 2016-10-18 DIAGNOSIS — E119 Type 2 diabetes mellitus without complications: Secondary | ICD-10-CM | POA: Diagnosis not present

## 2016-10-18 DIAGNOSIS — S43004A Unspecified dislocation of right shoulder joint, initial encounter: Secondary | ICD-10-CM

## 2016-10-18 DIAGNOSIS — Y929 Unspecified place or not applicable: Secondary | ICD-10-CM | POA: Diagnosis not present

## 2016-10-18 DIAGNOSIS — S4991XA Unspecified injury of right shoulder and upper arm, initial encounter: Secondary | ICD-10-CM | POA: Diagnosis present

## 2016-10-18 DIAGNOSIS — Z7984 Long term (current) use of oral hypoglycemic drugs: Secondary | ICD-10-CM | POA: Insufficient documentation

## 2016-10-18 DIAGNOSIS — Y9301 Activity, walking, marching and hiking: Secondary | ICD-10-CM | POA: Diagnosis not present

## 2016-10-18 LAB — CLOSTRIDIUM DIFFICILE BY PCR: Toxigenic C. Difficile by PCR: NOT DETECTED

## 2016-10-18 MED ORDER — PROPOFOL 10 MG/ML IV BOLUS
0.5000 mg/kg | Freq: Once | INTRAVENOUS | Status: AC
  Administered 2016-10-18: 40 mg via INTRAVENOUS
  Filled 2016-10-18: qty 20

## 2016-10-18 MED ORDER — MORPHINE SULFATE (PF) 4 MG/ML IV SOLN
4.0000 mg | Freq: Once | INTRAVENOUS | Status: AC
  Administered 2016-10-18: 4 mg via INTRAVENOUS

## 2016-10-18 MED ORDER — PROPOFOL 10 MG/ML IV BOLUS
INTRAVENOUS | Status: AC | PRN
  Administered 2016-10-18 (×2): 20 mg via INTRAVENOUS

## 2016-10-18 MED ORDER — MORPHINE SULFATE (PF) 4 MG/ML IV SOLN
INTRAVENOUS | Status: AC
  Filled 2016-10-18: qty 1

## 2016-10-18 MED ORDER — PROPOFOL 10 MG/ML IV BOLUS
INTRAVENOUS | Status: AC
  Filled 2016-10-18: qty 20

## 2016-10-18 NOTE — ED Notes (Signed)
EDP is at the bedside to reduce shoulder as pt tolerates

## 2016-10-18 NOTE — ED Notes (Signed)
Pt is alert and oriented times 3. Sinus on monitor. VSS.

## 2016-10-18 NOTE — Sedation Documentation (Signed)
Right shoulder back in place.

## 2016-10-18 NOTE — ED Notes (Signed)
Placed shoulder immobilizer on patient after placement.

## 2016-10-18 NOTE — Sedation Documentation (Signed)
20mg  propofol given per MD, Pfeiffer.

## 2016-10-18 NOTE — ED Provider Notes (Signed)
[place] [facility] Provider Note   CSN: [id number] Arrival date & time: 10/18/16  2018     History   Chief Complaint Chief Complaint  Patient presents with  . Shoulder Injury    HPI Kimberly Lin is a 71 y.o. female.  HPI Patient reports that she was coming out of her room after changing and turned and got slightly off balance. She reports this caused her to fall backwards into the door frame. She reports she struck her arm hard and then fell to the ground. She denies loss of consciousness. She does report that she had deformity and severe pain in her right shoulder. Patient reports that she has dislocated this shoulder in the past. Patient does endorse having had 2 alcohol cocktails this evening. She reports that was approximately 2 hours prior to her emergency department presentation. Past Medical History:  Diagnosis Date  . Abdominal mass   . Abnormal nuclear stress test 07/05/2014  . Acute encephalopathy 03/26/2016  . Anxiety   . Arthritis    osteoarthritis  . Colitis 2014  . Depression   . Diabetes mellitus   . Fibromyalgia   . GERD (gastroesophageal reflux disease)   . Hiatal hernia   . History of chicken pox   . History of septic shock 12/2009  . Hyperlipidemia   . Hypertension   . Migraine   . OSA (obstructive sleep apnea) 03/12/2011  . Personal history of colonic polyps   . Sepsis secondary to UTI ([place]) 03/26/2016  . Spinal stenosis     Patient Active Problem List   Diagnosis Date Noted  . Falls 03/26/2016  . Anxiety and depression 03/26/2016  . Altered mental status 03/25/2016  . Abnormal nuclear stress test 07/05/2014  . Fatty liver 03/26/2014  . Intertrigo 03/24/2014  . Allergic rhinitis 01/02/2014  . Incontinence, feces 11/10/2013  . Musculoskeletal pain 11/08/2013  . Dysuria 04/16/2013  . Benign paroxysmal positional vertigo 07/19/2012  . Obese 09/17/2011  . Knee pain 09/17/2011  . Weight gain 06/17/2011  . DOE (dyspnea on exertion)  04/15/2011  . OSA (obstructive sleep apnea) 03/12/2011  . Insomnia 01/15/2011  . Diabetes type 2, uncontrolled ([place]) 11/27/2010  . PVC (premature ventricular contraction) 11/27/2010  . Fibromyalgia   . Depression   . GERD (gastroesophageal reflux disease)   . Hyperlipidemia   . Hypertension   . Personal history of colonic polyps   . Migraine     Past Surgical History:  Procedure Laterality Date  . APPENDECTOMY  1983  . BREAST BIOPSY Right 2007, 2009  . CARDIAC CATHETERIZATION N/A 07/05/2014   Procedure: Left Heart Cath and Coronary Angiography;  Surgeon: Sherren Mocha, MD;  Location: [place] [facility];  Service: Cardiovascular;  Laterality: N/A;  . COLONOSCOPY  2014   normal   . MEDIAL PARTIAL KNEE REPLACEMENT Bilateral 2006  . OVARIAN CYST REMOVAL  2008   Pt states she had a ?cyst removed ?ovaries  . POLYPECTOMY  1998    OB History    No data available       Home Medications    Prior to Admission medications   Medication Sig Start Date End Date Taking? Authorizing Provider  ALPRAZolam Duanne Moron) 1 MG tablet Take 1 tablet (1 mg total) by mouth 2 (two) times daily as needed for anxiety. 10/10/16   Debbrah Alar, NP  amLODipine (NORVASC) 10 MG tablet Take 1 tablet (10 mg total) by mouth daily. 09/19/16   Debbrah Alar, NP  aspirin 81 MG tablet  Take 81 mg by mouth daily.      [provider]  atorvastatin (LIPITOR) 40 MG tablet Take 1 tablet (40 mg total) by mouth daily. 09/19/16   Debbrah Alar, NP  budesonide (ENTOCORT EC) 3 MG 24 hr capsule Take 3 capsules (9 mg total) by mouth daily. Stop after 8 weeks total treatment (2 rxs) 09/22/16   Gatha Mayer, MD  Calcium Carbonate-Vitamin D (CALCIUM-D) 600-400 MG-UNIT TABS Take 1 tablet by mouth daily.     [provider]  Coenzyme Q10 (CO Q 10) 100 MG CAPS Take 100 mg by mouth daily.    [provider]  diphenhydrAMINE (BENADRYL) 25 MG tablet Take 25 mg by mouth every 6 (six) hours as  needed for itching.     [provider]  diphenoxylate-atropine (LOMOTIL) 2.5-0.025 MG tablet Take 1 tablet by mouth 4 (four) times daily as needed for diarrhea or loose stools (may take 2 tabs). 10/15/16   Gatha Mayer, MD  escitalopram (LEXAPRO) 20 MG tablet Take 1 tablet (20 mg total) by mouth daily. 09/19/16   Debbrah Alar, NP  gabapentin (NEURONTIN) 300 MG capsule Take 1 capsule (300 mg total) by mouth 3 (three) times daily. 09/19/16   Debbrah Alar, NP  liraglutide (VICTOZA) 18 MG/3ML SOPN Inject 1.8mg  once daily 09/19/16   Debbrah Alar, NP  Magnesium Oxide 400 (240 Mg) MG TABS Take 1 tablet by mouth daily. 11/08/15   Debbrah Alar, NP  meclizine (ANTIVERT) 25 MG tablet Take 1 tablet (25 mg total) by mouth 3 (three) times daily as needed for dizziness. 01/01/16   Debbrah Alar, NP  metFORMIN (GLUCOPHAGE) 500 MG tablet TAKE TWO TABLETS BY MOUTH TWICE DAILY WITH MEALS 09/19/16   Debbrah Alar, NP  metoprolol succinate (TOPROL-XL) 50 MG 24 hr tablet Take 1 tablet (50 mg total) by mouth daily. Take with or immediately following a meal. 09/19/16   Debbrah Alar, NP  Multiple Vitamin (MULTIVITAMIN) tablet Take 1 tablet by mouth daily.      [provider]  Omega-3 Fatty Acids (FISH OIL) 1000 MG CAPS 2 caps by mouth twice daily Patient taking differently: Take 2,000 mg by mouth 2 (two) times daily.  04/16/11   Debbrah Alar, NP  ondansetron (ZOFRAN) 4 MG tablet TAKE ONE TABLET BY MOUTH EVERY 8 HOURS AS NEEDED FOR NAUSEA AND VOMITING 05/29/16   Debbrah Alar, NP  pantoprazole (PROTONIX) 40 MG tablet Take 1 tablet (40 mg total) by mouth daily. 09/19/16   Debbrah Alar, NP  venlafaxine XR (EFFEXOR-XR) 75 MG 24 hr capsule TAKE TWO CAPSULES BY MOUTH ONCE DAILY 04/02/16   Debbrah Alar, NP    Family History Family History  Problem Relation Age of Onset  . Arthritis Mother   . Hyperlipidemia Mother   . Hypertension Mother   .  Diabetes Mother   . Heart disease Mother   . Breast cancer Mother   . Lung cancer Brother   . Heart disease Brother        x 2  . Hypertension Brother   . Hyperlipidemia Brother   . Diabetes Brother   . Sudden death Father   . Lung cancer Father   . Rectal cancer Father   . Diabetes Sister   . Hyperlipidemia Sister   . Hypertension Sister   . Diabetes Maternal Aunt   . Heart attack Maternal Aunt   . Hyperlipidemia Maternal Aunt   . Hypertension Maternal Aunt   . Hypertension Maternal Uncle   .  Hyperlipidemia Maternal Uncle   . Heart attack Maternal Uncle   . Diabetes Maternal Uncle   . Diabetes Paternal Aunt   . Heart attack Paternal Aunt   . Hyperlipidemia Paternal Aunt   . Hypertension Paternal Aunt   . Hypertension Paternal Uncle   . Hyperlipidemia Paternal Uncle   . Heart attack Paternal Uncle   . Diabetes Paternal Uncle   . Prostate cancer Brother     Social History Social History  Substance Use Topics  . Smoking status: Former Smoker    Types: Cigarettes  . Smokeless tobacco: Never Used     Comment: Smoked socially.  . Alcohol use 3.6 - 4.8 oz/week    6 - 8 Glasses of wine per week     Allergies   Erythromycin; Losartan; and Prednisone   Review of Systems Review of Systems 10 Systems reviewed and are negative for acute change except as noted in the HPI.   Physical Exam Updated Vital Signs BP 110/60 (BP Location: Left Arm)   Pulse 89   Temp 98.7 F (37.1 C) (Oral)   Resp 18   Ht 5' 3.5" (1.613 m)   Wt 79.4 kg (175 lb)   SpO2 100%   BMI 30.51 kg/m   Physical Exam  Constitutional: She is oriented to person, place, and time. She appears well-developed and well-nourished. No distress.  HENT:  Head: Normocephalic and atraumatic.  Nose: Nose normal.  Mouth/Throat: Oropharynx is clear and moist.  Eyes: Pupils are equal, round, and reactive to light. Conjunctivae and EOM are normal.  Neck: Neck supple.  No C-spine tenderness  Cardiovascular:  Normal rate and regular rhythm.   No murmur heard. Pulmonary/Chest: Effort normal and breath sounds normal. No respiratory distress.  Abdominal: Soft. There is no tenderness.  Musculoskeletal: She exhibits tenderness and deformity. She exhibits no edema.  Right shoulder shows deformity. Patient has severe pain with any range of motion. Patient is neurovascularly intact. Normal range of motion lower extremity. No contusions abrasions or deformities.  Neurological: She is alert and oriented to person, place, and time. No cranial nerve deficit. She exhibits normal muscle tone. Coordination normal.  Skin: Skin is warm and dry.  Psychiatric: She has a normal mood and affect.  Nursing note and vitals reviewed.     ED Treatments / Results  Labs (all labs ordered are listed, but only abnormal results are displayed) Labs Reviewed - No data to display  EKG  EKG Interpretation None       Radiology Dg Shoulder Right  Result Date: 10/18/2016 CLINICAL DATA:  [place] onto RIGHT shoulder tonight, history of dislocations in the shoulder most recently in 2011 EXAM: RIGHT SHOULDER - 2+ VIEW COMPARISON:  05/25/2012 FINDINGS: Osseous demineralization. Degenerative changes RIGHT AC joint with inferior clavicular and acromial spur formation. Anterior RIGHT glenohumeral dislocation. No definite fractures. Visualized RIGHT ribs intact. IMPRESSION: Anterior RIGHT glenohumeral dislocation. Osseous demineralization with degenerative changes RIGHT AC joint. Electronically Signed   By: Lavonia Dana M.D.   On: 10/18/2016 20:53   Dg Shoulder Right Portable  Result Date: 10/18/2016 CLINICAL DATA:  Right shoulder dislocation, postreduction. EXAM: PORTABLE RIGHT SHOULDER COMPARISON:  Pre reduction radiographs earlier this day. FINDINGS: Successful reduction of previous anterior shoulder dislocation. Limited assessment for Hill-Sachs impaction injury given positioning. No displaced fracture is seen. Acromioclavicular joint  is congruent. IMPRESSION: Successful reduction of previous anterior shoulder dislocation. Electronically Signed   By: Jeb Levering M.D.   On: 10/18/2016 22:18    Procedures .Sedation  Date/Time: 10/19/2016 12:34 AM Performed by: Charlesetta Shanks Authorized by: Charlesetta Shanks   Consent:    Consent obtained:  Written   Consent given by:  Patient   Risks discussed:  Allergic reaction, prolonged hypoxia resulting in organ damage, prolonged sedation necessitating reversal, respiratory compromise necessitating ventilatory assistance and intubation, vomiting, nausea, inadequate sedation and dysrhythmia Pre-sedation assessment:    NPO status caution: urgency dictates proceeding with non-ideal NPO status     ASA classification: class 2 - patient with mild systemic disease     Neck mobility: normal     Mouth opening:  3 or more finger widths   Thyromental distance:  3 finger widths   Mallampati score:  II - soft palate, uvula, fauces visible   Pre-sedation assessments completed and reviewed: airway patency, cardiovascular function, hydration status, mental status, nausea/vomiting, pain level and respiratory function     Pre-sedation assessment completed:  10/18/2016 9:30 PM Immediate pre-procedure details:    Reassessment: Patient reassessed immediately prior to procedure     Reviewed: vital signs     Verified: bag valve mask available, emergency equipment available, intubation equipment available, IV patency confirmed, oxygen available, reversal medications available and suction available   Procedure details (see MAR for exact dosages):    Preoxygenation:  Nasal cannula   Sedation:  Propofol   Intra-procedure monitoring:  Blood pressure monitoring, cardiac monitor, continuous capnometry, continuous pulse oximetry, frequent LOC assessments and frequent vital sign checks   Intra-procedure events: none     Total Provider sedation time (minutes):  20 Post-procedure details:    Post-sedation  assessment completed:  10/18/2016 11:00 PM   Attendance: Constant attendance by certified staff until patient recovered     Recovery: Patient returned to pre-procedure baseline     Post-sedation assessments completed and reviewed: airway patency, cardiovascular function, hydration status, mental status, nausea/vomiting, pain level and respiratory function     Patient is stable for discharge or admission: yes     Patient tolerance:  Tolerated well, no immediate complications Reduction of dislocation Date/Time: 10/19/2016 12:40 AM Performed by: Charlesetta Shanks Authorized by: Charlesetta Shanks  Consent: Written consent obtained. Risks and benefits: risks, benefits and alternatives were discussed Consent given by: patient Patient understanding: patient states understanding of the procedure being performed Patient identity confirmed: verbally with patient and arm band Time out: Immediately prior to procedure a "time out" was called to verify the correct patient, procedure, equipment, support staff and site/side marked as required.  Sedation: Patient sedated: yes Sedation type: moderate (conscious) sedation Sedatives: propofol  Comments: Reduction accomplished with countertraction traction without difficulty. Postreduction films show good repositioning.    (including critical care time)  Medications Ordered in ED Medications  morphine 4 MG/ML injection 4 mg (4 mg Intravenous Given 10/18/16 2109)  propofol (DIPRIVAN) 10 mg/mL bolus/IV push 39.7 mg (40 mg Intravenous Given 10/18/16 2147)  propofol (DIPRIVAN) 10 mg/mL bolus/IV push (20 mg Intravenous Given 10/18/16 2150)     Initial Impression / Assessment and Plan / ED Course  I have reviewed the triage vital signs and the nursing notes.  Pertinent labs & imaging results that were available during my care of the patient were reviewed by me and considered in my medical decision making (see chart for details).     Final Clinical Impressions(s) /  ED Diagnoses   Final diagnoses:  Shoulder dislocation, right, initial encounter   Patient lost her balance and had mechanical fall. Isolated injury of right shoulder dislocation. Uncomplicated reduction with conscious sedation.  Postreduction patient is in good condition. She is alert and appropriate. New Prescriptions Discharge Medication List as of 10/18/2016 11:29 PM       Charlesetta Shanks, MD 10/19/16 [phone]

## 2016-10-18 NOTE — Sedation Documentation (Signed)
Xray at bedside for post-reduction film

## 2016-10-18 NOTE — ED Triage Notes (Signed)
Patient states that she fell into a dressor, hurt her right shoulder. Hx of shoulder dislocation. Deformity noted

## 2016-10-18 NOTE — ED Notes (Signed)
PT discharged to home with family. NAD. 

## 2016-10-18 NOTE — Sedation Documentation (Signed)
Unable to rate pain due to sedation.  

## 2016-10-20 ENCOUNTER — Encounter: Payer: Self-pay | Admitting: Nurse Practitioner

## 2016-10-20 ENCOUNTER — Telehealth: Payer: Self-pay | Admitting: Family

## 2016-10-20 NOTE — Progress Notes (Signed)
My Chart message is sent to patient re: negative C diff, stay on Entocort and call for f/u appt

## 2016-10-20 NOTE — Telephone Encounter (Signed)
See mychart.  

## 2016-10-21 ENCOUNTER — Other Ambulatory Visit: Payer: Self-pay

## 2016-10-21 MED ORDER — BUDESONIDE 3 MG PO CPEP: 9.0000 mg | ORAL_CAPSULE | Freq: Every day | ORAL | 0 refills | Status: DC

## 2016-10-23 ENCOUNTER — Encounter: Payer: Self-pay | Admitting: Family

## 2016-10-23 ENCOUNTER — Telehealth: Payer: Self-pay

## 2016-10-23 MED ORDER — VENLAFAXINE HCL ER 75 MG PO CP24: ORAL_CAPSULE | ORAL | 0 refills | Status: DC

## 2016-10-23 NOTE — Telephone Encounter (Signed)
Faxed Venlafaxine for 90d/OV in Nov/thx dmf

## 2016-10-24 ENCOUNTER — Encounter: Payer: Self-pay | Admitting: Family

## 2016-10-27 ENCOUNTER — Encounter: Payer: Self-pay | Admitting: Family

## 2016-10-28 ENCOUNTER — Ambulatory Visit (INDEPENDENT_AMBULATORY_CARE_PROVIDER_SITE_OTHER): Payer: Medicare Other | Admitting: Family Medicine

## 2016-10-28 ENCOUNTER — Encounter: Payer: Self-pay | Admitting: Family

## 2016-10-28 ENCOUNTER — Ambulatory Visit

## 2016-10-28 ENCOUNTER — Telehealth: Payer: Self-pay | Admitting: *Deleted

## 2016-10-28 ENCOUNTER — Encounter: Payer: Self-pay | Admitting: Family Medicine

## 2016-10-28 DIAGNOSIS — I2583 Coronary atherosclerosis due to lipid rich plaque: Secondary | ICD-10-CM

## 2016-10-28 DIAGNOSIS — S43014A Anterior dislocation of right humerus, initial encounter: Secondary | ICD-10-CM

## 2016-10-28 DIAGNOSIS — I2510 Atherosclerotic heart disease of native coronary artery without angina pectoris: Secondary | ICD-10-CM

## 2016-10-28 NOTE — Telephone Encounter (Signed)
Received results from [facility], CPAP compliance & therapy report; forwarded to provider/SLS 09/18

## 2016-10-28 NOTE — Patient Instructions (Signed)
You dislocated your shoulder. Your x-rays and your exam are reassuring otherwise. You do not need to wear a sling any longer. Do motion exercises (arm circles, swings, table slides) 3 sets of 10 once or twice a day. Wait another 1-2 weeks before starting the strengthening exercises with the yellow theraband - 3 sets of 10 once a day. Icing, ibuprofen only if needed. Follow up with me in 4 weeks for reevaluation.

## 2016-10-29 ENCOUNTER — Encounter: Payer: Self-pay | Admitting: Nurse Practitioner

## 2016-10-29 ENCOUNTER — Other Ambulatory Visit: Payer: Self-pay

## 2016-10-29 ENCOUNTER — Encounter: Payer: Self-pay | Admitting: Family

## 2016-10-29 DIAGNOSIS — S43014A Anterior dislocation of right humerus, initial encounter: Secondary | ICD-10-CM | POA: Insufficient documentation

## 2016-10-29 MED ORDER — BUDESONIDE 3 MG PO CPEP: 9.0000 mg | ORAL_CAPSULE | Freq: Every day | ORAL | 0 refills | Status: DC

## 2016-10-29 MED ORDER — VENLAFAXINE HCL ER 75 MG PO CP24: ORAL_CAPSULE | ORAL | 1 refills | Status: DC

## 2016-10-29 NOTE — Progress Notes (Signed)
PCP: Debbrah Alar, NP  Subjective:   HPI: Patient is a 71 y.o. female here for right shoulder injury.  Patient reports on 9/8 she was at home when she turned around and fell backwards against the door with her right shoulder. Immediate pain, inability to use this arm. No prior dislocation of this shoulder. Was reduced under anesthesia in ED. She took ibuprofen the other day. Pain is 3/10 and more dull, worse with motions of shoulder. Stopped using the sling recently. No skin changes, numbness. Right handed.  Past Medical History:  Diagnosis Date  . Abdominal mass   . Abnormal nuclear stress test 07/05/2014  . Acute encephalopathy 03/26/2016  . Anxiety   . Arthritis    osteoarthritis  . Colitis 2014  . Depression   . Diabetes mellitus   . Fibromyalgia   . GERD (gastroesophageal reflux disease)   . Hiatal hernia   . History of chicken pox   . History of septic shock 12/2009  . Hyperlipidemia   . Hypertension   . Migraine   . OSA (obstructive sleep apnea) 03/12/2011  . Personal history of colonic polyps   . Sepsis secondary to UTI ([facility]) 03/26/2016  . Spinal stenosis     Current Outpatient Prescriptions on File Prior to Visit  Medication Sig Dispense Refill  . ALPRAZolam (XANAX) 1 MG tablet Take 1 tablet (1 mg total) by mouth 2 (two) times daily as needed for anxiety. 28 tablet 0  . amLODipine (NORVASC) 10 MG tablet Take 1 tablet (10 mg total) by mouth daily. 90 tablet 1  . aspirin 81 MG tablet Take 81 mg by mouth daily.      Marland Kitchen atorvastatin (LIPITOR) 40 MG tablet Take 1 tablet (40 mg total) by mouth daily. 90 tablet 1  . budesonide (ENTOCORT EC) 3 MG 24 hr capsule Take 3 capsules (9 mg total) by mouth daily. Stop after 8 weeks total treatment (2 rxs) 90 capsule 0  . Calcium Carbonate-Vitamin D (CALCIUM-D) 600-400 MG-UNIT TABS Take 1 tablet by mouth daily.     . Coenzyme Q10 (CO Q 10) 100 MG CAPS Take 100 mg by mouth daily.    . diphenhydrAMINE (BENADRYL) 25 MG tablet  Take 25 mg by mouth every 6 (six) hours as needed for itching.     . diphenoxylate-atropine (LOMOTIL) 2.5-0.025 MG tablet Take 1 tablet by mouth 4 (four) times daily as needed for diarrhea or loose stools (may take 2 tabs). 90 tablet 0  . escitalopram (LEXAPRO) 20 MG tablet Take 1 tablet (20 mg total) by mouth daily. 90 tablet 1  . gabapentin (NEURONTIN) 300 MG capsule Take 1 capsule (300 mg total) by mouth 3 (three) times daily. 90 capsule 1  . liraglutide (VICTOZA) 18 MG/3ML SOPN Inject 1.8mg  once daily 9 mL 1  . Magnesium Oxide 400 (240 Mg) MG TABS Take 1 tablet by mouth daily. 30 tablet   . meclizine (ANTIVERT) 25 MG tablet Take 1 tablet (25 mg total) by mouth 3 (three) times daily as needed for dizziness. 30 tablet 0  . metFORMIN (GLUCOPHAGE) 500 MG tablet TAKE TWO TABLETS BY MOUTH TWICE DAILY WITH MEALS 120 tablet 1  . metoprolol succinate (TOPROL-XL) 50 MG 24 hr tablet Take 1 tablet (50 mg total) by mouth daily. Take with or immediately following a meal. 90 tablet 1  . Multiple Vitamin (MULTIVITAMIN) tablet Take 1 tablet by mouth daily.      . Omega-3 Fatty Acids (FISH OIL) 1000 MG CAPS 2 caps by  mouth twice daily (Patient taking differently: Take 2,000 mg by mouth 2 (two) times daily. )  0  . ondansetron (ZOFRAN) 4 MG tablet TAKE ONE TABLET BY MOUTH EVERY 8 HOURS AS NEEDED FOR NAUSEA AND VOMITING 20 tablet 0  . pantoprazole (PROTONIX) 40 MG tablet Take 1 tablet (40 mg total) by mouth daily. 90 tablet 1  . venlafaxine XR (EFFEXOR-XR) 75 MG 24 hr capsule TAKE TWO CAPSULES BY MOUTH ONCE DAILY 180 capsule 0   No current facility-administered medications on file prior to visit.     Past Surgical History:  Procedure Laterality Date  . APPENDECTOMY  1983  . BREAST BIOPSY Right 2007, 2009  . CARDIAC CATHETERIZATION N/A 07/05/2014   Procedure: Left Heart Cath and Coronary Angiography;  Surgeon: Sherren Mocha, MD;  Location: [facility];  Service: Cardiovascular;  Laterality: N/A;  .  COLONOSCOPY  2014   normal   . MEDIAL PARTIAL KNEE REPLACEMENT Bilateral 2006  . OVARIAN CYST REMOVAL  2008   Pt states she had a ?cyst removed ?ovaries  . POLYPECTOMY  1998    Allergies  Allergen Reactions  . Erythromycin Swelling    "all mycins"  . Losartan     hyperkalemia  . Prednisone Other (See Comments)    "Insomnia" and "makes me crazy"    Social History   Social History  . Marital status: Married    Spouse name: N/A  . Number of children: 2  . Years of education: N/A   Occupational History  . Not on file.   Social History Main Topics  . Smoking status: Former Smoker    Types: Cigarettes  . Smokeless tobacco: Never Used     Comment: Smoked socially.  . Alcohol use 3.6 - 4.8 oz/week    6 - 8 Glasses of wine per week  . Drug use: No  . Sexual activity: Not on file   Other Topics Concern  . Not on file   Social History Narrative   Regular exercise:  No   Caffeine use: 1 cup daily          Family History  Problem Relation Age of Onset  . Arthritis Mother   . Hyperlipidemia Mother   . Hypertension Mother   . Diabetes Mother   . Heart disease Mother   . Breast cancer Mother   . Lung cancer Brother   . Heart disease Brother        x 2  . Hypertension Brother   . Hyperlipidemia Brother   . Diabetes Brother   . Sudden death Father   . Lung cancer Father   . Rectal cancer Father   . Diabetes Sister   . Hyperlipidemia Sister   . Hypertension Sister   . Diabetes Maternal Aunt   . Heart attack Maternal Aunt   . Hyperlipidemia Maternal Aunt   . Hypertension Maternal Aunt   . Hypertension Maternal Uncle   . Hyperlipidemia Maternal Uncle   . Heart attack Maternal Uncle   . Diabetes Maternal Uncle   . Diabetes Paternal Aunt   . Heart attack Paternal Aunt   . Hyperlipidemia Paternal Aunt   . Hypertension Paternal Aunt   . Hypertension Paternal Uncle   . Hyperlipidemia Paternal Uncle   . Heart attack Paternal Uncle   . Diabetes Paternal Uncle    . Prostate cancer Brother     BP 131/85   Pulse 87   Ht 5\' 4"  (1.626 m)   Wt 175 lb (  79.4 kg)   BMI 30.04 kg/m   Review of Systems: See HPI above.     Objective:  Physical Exam:  Gen: NAD, comfortable in exam room  Right shoulder: No swelling, ecchymoses.  No gross deformity. Mild TTP anterior and posterior shoulder joint. Full IR and ER.  Abduction to 90 degrees, flexion to 110. Negative Hawkins, Neers. Negative Yergasons. Strength 5-/5 with empty can and 5/5 resisted internal/external rotation. Pain with apprehension. NV intact distally.  Left shoulder: No swelling, ecchymoses.  No gross deformity. No TTP. FROM. Strength 5/5 with empty can and resisted internal/external rotation. Negative apprehension. NV intact distally.   Assessment & Plan:  1. Right shoulder dislocation - independently reviewed radiographs and no evidence fracture, bony bankart, hill sachs seen.  Exam is reassuring she does not have a full thickness rotator cuff tear.  Shown home motion exercises to do daily and advance to strengthening in 1-2 weeks.  Icing, ibuprofen if needed.  F/u in 4 weeks for reevaluation.

## 2016-10-29 NOTE — Assessment & Plan Note (Signed)
independently reviewed radiographs and no evidence fracture, bony bankart, hill sachs seen.  Exam is reassuring she does not have a full thickness rotator cuff tear.  Shown home motion exercises to do daily and advance to strengthening in 1-2 weeks.  Icing, ibuprofen if needed.  F/u in 4 weeks for reevaluation.

## 2016-10-30 ENCOUNTER — Encounter: Payer: Self-pay | Admitting: Nurse Practitioner

## 2016-10-31 NOTE — Telephone Encounter (Signed)
Patient cannot afford Budesonide (Entocort)  [facility] will do a compuded version that is cheaper  5 mg capsules take 2 each day  # 60 2 refills She needs to make an appointment to see me in November also  I think this may be $60 an rx  Not cheap but not terrible as some go  You can call [facility] and get exact cost  Other option is a pharmacy in [place] but I do not remember the name - some of other staff might

## 2016-11-03 ENCOUNTER — Telehealth: Payer: Self-pay | Admitting: Internal Medicine

## 2016-11-03 ENCOUNTER — Telehealth: Payer: Self-pay | Admitting: Family

## 2016-11-03 MED ORDER — AMBULATORY NON FORMULARY MEDICATION: 2 refills | Status: DC

## 2016-11-03 NOTE — Telephone Encounter (Signed)
Could you please contact patient about unread mychart message? Thanks.

## 2016-11-03 NOTE — Telephone Encounter (Signed)
All questions answered

## 2016-11-03 NOTE — Telephone Encounter (Signed)
I spoke with the patient.  She will go by [facility] and decide if she can afford. She declines appt for now.  She will call back to schedule OV at a later date.

## 2016-11-04 NOTE — Telephone Encounter (Signed)
9/10 my chart message:   I received note about your fall. I am sorry to hear that you hurt your shoulder. I did give some thoughts to your medications and your xanax can increase you risk of falls. I think it would be a good idea for [place] to start to taper you off of the xanax. Please cut your xanax pills in half for now and take 1/2 tab twice daily as needed. We can talk about further taper when I see you back in November.    Take care,   Lamyiah Crawshaw    This MyChart message has not been read.

## 2016-11-04 NOTE — Telephone Encounter (Signed)
Are you talking about the email addressing the flu vaccine and calling to set up time for that?

## 2016-11-04 NOTE — Telephone Encounter (Signed)
Left message for pt to return my call.

## 2016-11-05 ENCOUNTER — Encounter: Payer: Self-pay | Admitting: Family

## 2016-11-05 ENCOUNTER — Ambulatory Visit (INDEPENDENT_AMBULATORY_CARE_PROVIDER_SITE_OTHER): Payer: Medicare Other | Admitting: Medical

## 2016-11-05 ENCOUNTER — Encounter: Payer: Self-pay | Admitting: Medical

## 2016-11-05 VITALS — BP 134/80 | HR 78 | Temp 97.9°F | Resp 16 | Ht 64.0 in | Wt 193.8 lb

## 2016-11-05 DIAGNOSIS — I2510 Atherosclerotic heart disease of native coronary artery without angina pectoris: Secondary | ICD-10-CM

## 2016-11-05 DIAGNOSIS — N3000 Acute cystitis without hematuria: Secondary | ICD-10-CM

## 2016-11-05 DIAGNOSIS — E119 Type 2 diabetes mellitus without complications: Secondary | ICD-10-CM | POA: Diagnosis not present

## 2016-11-05 DIAGNOSIS — R300 Dysuria: Secondary | ICD-10-CM | POA: Diagnosis not present

## 2016-11-05 DIAGNOSIS — I2583 Coronary atherosclerosis due to lipid rich plaque: Secondary | ICD-10-CM | POA: Diagnosis not present

## 2016-11-05 LAB — POCT URINALYSIS DIPSTICK
BILIRUBIN UA: NEGATIVE
Blood, UA: NEGATIVE
GLUCOSE UA: NEGATIVE
Nitrite, UA: NEGATIVE
Spec Grav, UA: >=1.03 — AB (ref 1.010–1.025)
Urobilinogen, UA: NEGATIVE E.U./dL — AB
pH, UA: 6 (ref 5.0–8.0)

## 2016-11-05 MED ORDER — ONDANSETRON 4 MG PO TBDP: 4.0000 mg | ORAL_TABLET | Freq: Three times a day (TID) | ORAL | 0 refills | Status: DC | PRN

## 2016-11-05 MED ORDER — NITROFURANTOIN MONOHYD MACRO 100 MG PO CAPS: 100.0000 mg | ORAL_CAPSULE | Freq: Two times a day (BID) | ORAL | 0 refills | Status: DC

## 2016-11-05 NOTE — Telephone Encounter (Signed)
Notified pt and she is not in agreement with weaning off Xanax. Pt states her fall was because she tripped over a chair and is asking not to wean off of xanax. Pt states she did not pick up xanax that we sent on 10/07/16 to [facility]. She is having trouble getting [facility] to release her refill on xanax. Pt states she only takes 1 at bedtime.  Please advise?

## 2016-11-05 NOTE — Telephone Encounter (Signed)
Pt called and was scheduled of OV to check urine.

## 2016-11-05 NOTE — Patient Instructions (Signed)
You appear to have a urinary tract infection. I am prescribing  macrobid antibiotic for the probable infection. Hydrate well. I am sending out a urine culture. During the interim if your signs and symptoms worsen rather than improving please notify us. We will notify your when the culture results are back.  Zofran for nausea.   Low sugar diet and stay on diabetic meds while being treated for likely uti.  Follow up in 7 days or as needed.

## 2016-11-05 NOTE — Progress Notes (Signed)
Subjective:    Patient ID: Kimberly Lin, female    DOB: 02-08-1946, 71 y.o.   MRN: [id number]  HPI   Pt in for Evaluation.  She has history of uti with some occasional sepsis with uti in the past.  Pt describes about 2 events of hospitalization from this in past.(Describes rapid worsening of mild UTI symptoms in the past preceding sepsis.)  Most recently she had uti symptom.  Pt in today reporting urinary symptoms x 3 days.  Dysuria- yes Frequent urination-yes Hesitancy-some Suprapubic pressure-yes. Fever-no. chills-yes Nausea-yes Vomiting-no CVA pain-yes  but hx of spinal stenosis and describes difficult to say what could be the source of her pain. History of UTI-ye Gross hematuria-none.   Review of Systems  Constitutional: Negative for chills, fatigue and fever.  Respiratory: Negative for cough, chest tightness, shortness of breath and wheezing.   Cardiovascular: Negative for chest pain and palpitations.  Gastrointestinal: Negative for abdominal pain.  Genitourinary: Positive for dysuria, frequency and urgency. Negative for hematuria.  Musculoskeletal: Negative for back pain, myalgias and neck stiffness.  Skin: Negative for rash.  Neurological: Negative for dizziness, speech difficulty and headaches.  Hematological: Negative for adenopathy. Does not bruise/bleed easily.  Psychiatric/Behavioral: Negative for behavioral problems and confusion. The patient is not nervous/anxious.    Past Medical History:  Diagnosis Date  . Abdominal mass   . Abnormal nuclear stress test 07/05/2014  . Acute encephalopathy 03/26/2016  . Anxiety   . Arthritis    osteoarthritis  . Colitis 2014  . Depression   . Diabetes mellitus   . Fibromyalgia   . GERD (gastroesophageal reflux disease)   . Hiatal hernia   . History of chicken pox   . History of septic shock 12/2009  . Hyperlipidemia   . Hypertension   . Migraine   . OSA (obstructive sleep apnea) 03/12/2011  . Personal  history of colonic polyps   . Sepsis secondary to UTI ([facility]) 03/26/2016  . Spinal stenosis      Social History   Social History  . Marital status: Married    Spouse name: N/A  . Number of children: 2  . Years of education: N/A   Occupational History  . Not on file.   Social History Main Topics  . Smoking status: Former Smoker    Types: Cigarettes  . Smokeless tobacco: Never Used     Comment: Smoked socially.  . Alcohol use 3.6 - 4.8 oz/week    6 - 8 Glasses of wine per week  . Drug use: No  . Sexual activity: Not on file   Other Topics Concern  . Not on file   Social History Narrative   Regular exercise:  No   Caffeine use: 1 cup daily          Past Surgical History:  Procedure Laterality Date  . APPENDECTOMY  1983  . BREAST BIOPSY Right 2007, 2009  . CARDIAC CATHETERIZATION N/A 07/05/2014   Procedure: Left Heart Cath and Coronary Angiography;  Surgeon: Tonny Bollman, MD;  Location: [facility];  Service: Cardiovascular;  Laterality: N/A;  . COLONOSCOPY  2014   normal   . MEDIAL PARTIAL KNEE REPLACEMENT Bilateral 2006  . OVARIAN CYST REMOVAL  2008   Pt states she had a ?cyst removed ?ovaries  . POLYPECTOMY  1998    Family History  Problem Relation Age of Onset  . Arthritis Mother   . Hyperlipidemia Mother   . Hypertension Mother   . Diabetes  Mother   . Heart disease Mother   . Breast cancer Mother   . Lung cancer Brother   . Heart disease Brother        x 2  . Hypertension Brother   . Hyperlipidemia Brother   . Diabetes Brother   . Sudden death Father   . Lung cancer Father   . Rectal cancer Father   . Diabetes Sister   . Hyperlipidemia Sister   . Hypertension Sister   . Diabetes Maternal Aunt   . Heart attack Maternal Aunt   . Hyperlipidemia Maternal Aunt   . Hypertension Maternal Aunt   . Hypertension Maternal Uncle   . Hyperlipidemia Maternal Uncle   . Heart attack Maternal Uncle   . Diabetes Maternal Uncle   . Diabetes Paternal  Aunt   . Heart attack Paternal Aunt   . Hyperlipidemia Paternal Aunt   . Hypertension Paternal Aunt   . Hypertension Paternal Uncle   . Hyperlipidemia Paternal Uncle   . Heart attack Paternal Uncle   . Diabetes Paternal Uncle   . Prostate cancer Brother     Allergies  Allergen Reactions  . Erythromycin Swelling    "all mycins"  . Losartan     hyperkalemia  . Prednisone Other (See Comments)    "Insomnia" and "makes me crazy"    Current Outpatient Prescriptions on File Prior to Visit  Medication Sig Dispense Refill  . ALPRAZolam (XANAX) 1 MG tablet Take 1 tablet (1 mg total) by mouth 2 (two) times daily as needed for anxiety. 28 tablet 0  . AMBULATORY NON FORMULARY MEDICATION Medication Name: Compounded Budesonide 5 mg Please take 10 mg daily 60 capsule 2  . amLODipine (NORVASC) 10 MG tablet Take 1 tablet (10 mg total) by mouth daily. 90 tablet 1  . aspirin 81 MG tablet Take 81 mg by mouth daily.      Marland Kitchen atorvastatin (LIPITOR) 40 MG tablet Take 1 tablet (40 mg total) by mouth daily. 90 tablet 1  . budesonide (ENTOCORT EC) 3 MG 24 hr capsule Take 3 capsules (9 mg total) by mouth daily. Stop after 8 weeks total treatment (2 rxs) 90 capsule 0  . Calcium Carbonate-Vitamin D (CALCIUM-D) 600-400 MG-UNIT TABS Take 1 tablet by mouth daily.     . Coenzyme Q10 (CO Q 10) 100 MG CAPS Take 100 mg by mouth daily.    . diphenhydrAMINE (BENADRYL) 25 MG tablet Take 25 mg by mouth every 6 (six) hours as needed for itching.     . diphenoxylate-atropine (LOMOTIL) 2.5-0.025 MG tablet Take 1 tablet by mouth 4 (four) times daily as needed for diarrhea or loose stools (may take 2 tabs). 90 tablet 0  . escitalopram (LEXAPRO) 20 MG tablet Take 1 tablet (20 mg total) by mouth daily. 90 tablet 1  . gabapentin (NEURONTIN) 300 MG capsule Take 1 capsule (300 mg total) by mouth 3 (three) times daily. 90 capsule 1  . liraglutide (VICTOZA) 18 MG/3ML SOPN Inject 1.8mg  once daily 9 mL 1  . Magnesium Oxide 400 (240 Mg)  MG TABS Take 1 tablet by mouth daily. 30 tablet   . meclizine (ANTIVERT) 25 MG tablet Take 1 tablet (25 mg total) by mouth 3 (three) times daily as needed for dizziness. 30 tablet 0  . metFORMIN (GLUCOPHAGE) 500 MG tablet TAKE TWO TABLETS BY MOUTH TWICE DAILY WITH MEALS 120 tablet 1  . metoprolol succinate (TOPROL-XL) 50 MG 24 hr tablet Take 1 tablet (50 mg total) by mouth daily.  Take with or immediately following a meal. 90 tablet 1  . Multiple Vitamin (MULTIVITAMIN) tablet Take 1 tablet by mouth daily.      . Omega-3 Fatty Acids (FISH OIL) 1000 MG CAPS 2 caps by mouth twice daily (Patient taking differently: Take 2,000 mg by mouth 2 (two) times daily. )  0  . ondansetron (ZOFRAN) 4 MG tablet TAKE ONE TABLET BY MOUTH EVERY 8 HOURS AS NEEDED FOR NAUSEA AND VOMITING 20 tablet 0  . pantoprazole (PROTONIX) 40 MG tablet Take 1 tablet (40 mg total) by mouth daily. 90 tablet 1  . venlafaxine XR (EFFEXOR-XR) 75 MG 24 hr capsule TAKE TWO CAPSULES BY MOUTH ONCE DAILY 180 capsule 1   No current facility-administered medications on file prior to visit.     BP 134/80   Pulse 78   Temp 97.9 F (36.6 C) (Oral)   Resp 16   Ht 5\' 4"  (1.626 m)   Wt 193 lb 12.8 oz (87.9 kg)   SpO2 96%   BMI 33.27 kg/m       Objective:   Physical Exam  General- No acute distress. Pleasant patient. Neck- Full range of motion, no jvd Lungs- Clear, even and unlabored. Heart- regular rate and rhythm. Neurologic- CNII- XII grossly intact.  Back- faint  cva tenderness.  Abdomen- soft, non-tender in abdomen but  faint tender suprapubic area., non distended, +bs, no rebound or guarding.           Assessment & Plan:  You appear to have a urinary tract infection. I am prescribing  macrobid antibiotic for the probable infection. Hydrate well. I am sending out a urine culture. During the interim if your signs and symptoms worsen rather than improving please notify us. We will notify your when the culture results are  back.  Zofran for nausea.   Low sugar diet and stay on diabetic meds while being treated for likely uti.  Follow up in 7 days or as needed.  Note regarding choice of antibiotic. She reports at one point the past she got a rash with Cipro but historically that has been antibiotic which she has often used. On review of her kidney function recent GFR believe was above 60. So thought it was relatively safe and reasonable to use Macrobid.  Joycelynn Fritsche, Ramon Dredge, PA-C

## 2016-11-06 ENCOUNTER — Telehealth: Payer: Self-pay | Admitting: Medical

## 2016-11-06 LAB — URINE CULTURE
MICRO NUMBER:: 81066973
SPECIMEN QUALITY:: ADEQUATE

## 2016-11-06 MED ORDER — ALPRAZOLAM 1 MG PO TABS: 0.5000 mg | ORAL_TABLET | Freq: Every evening | ORAL | 0 refills | Status: DC | PRN

## 2016-11-06 MED ORDER — LEVOFLOXACIN 500 MG PO TABS: 500.0000 mg | ORAL_TABLET | Freq: Every day | ORAL | 0 refills | Status: DC

## 2016-11-06 NOTE — Telephone Encounter (Signed)
I would still recommend that she decrease the xanax to 1/2 tablet at bedtime.  Xanax is really not recommended in people >71 yrs old due to increased fall risk.

## 2016-11-06 NOTE — Telephone Encounter (Signed)
Caller name: Thalia Relation to pt: self Call back number: [phone] Pharmacy: [facility], [place].  Reason for call: Pt was seen yesterday at our office for a UTI and pt went to get rx that provider prescribed her but pt states that pharmacy mention the med that was prescribed for pt is in back order and they do not know when they will receive the medication. Pt is needing a new rx to be sent that will help her with UTI ASAP since still not feeling well. Please advise ASAP.

## 2016-11-06 NOTE — Telephone Encounter (Signed)
I sent in levofloxacin to patient's pharmacy. Please notify her. Have her start the medication today.

## 2016-11-07 NOTE — Telephone Encounter (Signed)
Called and spoke with the pt and informed her of the message below.  Pt stated that she will not decrease the xanax to 1/2 tablet at bedtime.  She stated that she has been taking it for along time to help her sleep.  She said if she does not take it she can not sleep.    She stated that she will call and schedule an appointment with [name] next week to discuss this.//AB/CMA

## 2016-11-07 NOTE — Telephone Encounter (Signed)
Noted  

## 2016-11-10 ENCOUNTER — Telehealth: Payer: Self-pay | Admitting: Family

## 2016-11-10 DIAGNOSIS — G4733 Obstructive sleep apnea (adult) (pediatric): Secondary | ICD-10-CM

## 2016-11-10 NOTE — Telephone Encounter (Signed)
Please let patient know that I reviewed her most recent download from her CPAP machine. It appears that she is having some leak around the mask and is continuing to have some sleep apnea events. I would like for her to meet with a sleep specialist. Referral pended.

## 2016-11-10 NOTE — Telephone Encounter (Signed)
Left message for pt to return my call tomorrow.

## 2016-11-11 DIAGNOSIS — M4727 Other spondylosis with radiculopathy, lumbosacral region: Secondary | ICD-10-CM | POA: Diagnosis not present

## 2016-11-11 DIAGNOSIS — M5417 Radiculopathy, lumbosacral region: Secondary | ICD-10-CM | POA: Diagnosis not present

## 2016-11-11 NOTE — Telephone Encounter (Signed)
Left detailed message on pt's voicemail and to call if any questions and to let us know if she has not been contacted in 1 week about referral.

## 2016-11-16 ENCOUNTER — Encounter: Payer: Self-pay | Admitting: Family

## 2016-11-18 ENCOUNTER — Ambulatory Visit (INDEPENDENT_AMBULATORY_CARE_PROVIDER_SITE_OTHER): Payer: Medicare Other | Admitting: Behavioral Health

## 2016-11-18 DIAGNOSIS — Z23 Encounter for immunization: Secondary | ICD-10-CM

## 2016-11-18 NOTE — Progress Notes (Signed)
Pre visit review using our clinic review tool, if applicable. No additional management support is needed unless otherwise documented below in the visit note.  Patient came in clinic for influenza vaccination. IM injection was given in the left deltoid. Patient tolerated the injection well. No signs or symptoms of a reaction before leaving the nurse visit.

## 2016-11-25 ENCOUNTER — Ambulatory Visit: Admitting: Family Medicine

## 2016-12-12 ENCOUNTER — Ambulatory Visit (INDEPENDENT_AMBULATORY_CARE_PROVIDER_SITE_OTHER): Payer: Medicare Other | Admitting: Family

## 2016-12-12 ENCOUNTER — Encounter: Payer: Self-pay | Admitting: Family

## 2016-12-12 VITALS — BP 153/86 | HR 85 | Temp 98.6°F | Resp 16 | Ht 64.0 in | Wt 186.6 lb

## 2016-12-12 DIAGNOSIS — G4733 Obstructive sleep apnea (adult) (pediatric): Secondary | ICD-10-CM | POA: Diagnosis not present

## 2016-12-12 DIAGNOSIS — I2510 Atherosclerotic heart disease of native coronary artery without angina pectoris: Secondary | ICD-10-CM

## 2016-12-12 DIAGNOSIS — F419 Anxiety disorder, unspecified: Secondary | ICD-10-CM

## 2016-12-12 DIAGNOSIS — I2583 Coronary atherosclerosis due to lipid rich plaque: Secondary | ICD-10-CM | POA: Diagnosis not present

## 2016-12-12 DIAGNOSIS — G4700 Insomnia, unspecified: Secondary | ICD-10-CM

## 2016-12-12 DIAGNOSIS — E785 Hyperlipidemia, unspecified: Secondary | ICD-10-CM

## 2016-12-12 DIAGNOSIS — E119 Type 2 diabetes mellitus without complications: Secondary | ICD-10-CM | POA: Diagnosis not present

## 2016-12-12 DIAGNOSIS — Z79899 Other long term (current) drug therapy: Secondary | ICD-10-CM | POA: Diagnosis not present

## 2016-12-12 LAB — LIPID PANEL
CHOLESTEROL: 131 mg/dL (ref 0–200)
HDL: 50.6 mg/dL (ref >39.00)
LDL Cholesterol: 68 mg/dL (ref 0–99)
NONHDL: 80.84
Total CHOL/HDL Ratio: 3
Triglycerides: 65 mg/dL (ref 0.0–149.0)
VLDL: 13 mg/dL (ref 0.0–40.0)

## 2016-12-12 LAB — HEMOGLOBIN A1C: HEMOGLOBIN A1C: 7.3 % — AB (ref 4.6–6.5)

## 2016-12-12 LAB — BASIC METABOLIC PANEL
BUN: 14 mg/dL (ref 6–23)
CHLORIDE: 99 meq/L (ref 96–112)
CO2: 30 meq/L (ref 19–32)
CREATININE: 0.75 mg/dL (ref 0.40–1.20)
Calcium: 9.5 mg/dL (ref 8.4–10.5)
GFR: 80.97 mL/min (ref >60.00)
Glucose, Bld: 138 mg/dL — ABNORMAL HIGH (ref 70–99)
POTASSIUM: 4.4 meq/L (ref 3.5–5.1)
SODIUM: 136 meq/L (ref 135–145)

## 2016-12-12 NOTE — Progress Notes (Signed)
Subjective:    Patient ID: Kimberly Lin, female    DOB: 1945/06/16, 71 y.o.   MRN: [id number]  HPI  Patient is a 71 year old female who presents today for follow-up.  Since her last visit with me she suffered a fall with shoulder dislocation.  Review of her medical record indicates that she turned and lost her balance prior to the fall.  In the past while taking Ambien she also had an accidental roll out of bed fall.  At that time she was taking Ambien and Xanax.  Ambien was discontinued at that time.  She also had an admission back in February of this year prior to which she was having multiple falls at home.  PT was consulted.  Reports chronic insomnia and being maintained on Xanax since her 69s.  She is using cpap and has using x 1 month.  Reports that her energy is improved during the day.  [facility].    Lab Results  Component Value Date   HGBA1C 6.2 01/16/2016   HGBA1C 7.0 (H) 09/14/2015   HGBA1C 6.5 03/21/2015   Lab Results  Component Value Date   MICROALBUR 2.0 (H) 03/24/2014   LDLCALC 92 01/16/2016   CREATININE 0.75 09/19/2016    Lab Results  Component Value Date   HGBA1C 6.2 01/16/2016   Review of Systems    see HPI  Past Medical History:  Diagnosis Date  . Abdominal mass   . Abnormal nuclear stress test 07/05/2014  . Acute encephalopathy 03/26/2016  . Anxiety   . Arthritis    osteoarthritis  . Colitis 2014  . Depression   . Diabetes mellitus   . Fibromyalgia   . GERD (gastroesophageal reflux disease)   . Hiatal hernia   . History of chicken pox   . History of septic shock 12/2009  . Hyperlipidemia   . Hypertension   . Migraine   . OSA (obstructive sleep apnea) 03/12/2011  . Personal history of colonic polyps   . Sepsis secondary to UTI ([place]) 03/26/2016  . Spinal stenosis      Social History   Social History  . Marital status: Married    Spouse name: N/A  . Number of children: 2  . Years of education: N/A   Occupational  History  . Not on file.   Social History Main Topics  . Smoking status: Former Smoker    Types: Cigarettes  . Smokeless tobacco: Never Used     Comment: Smoked socially.  . Alcohol use 3.6 - 4.8 oz/week    6 - 8 Glasses of wine per week  . Drug use: No  . Sexual activity: Not on file   Other Topics Concern  . Not on file   Social History Narrative   Regular exercise:  No   Caffeine use: 1 cup daily          Past Surgical History:  Procedure Laterality Date  . APPENDECTOMY  1983  . BREAST BIOPSY Right 2007, 2009  . CARDIAC CATHETERIZATION N/A 07/05/2014   Procedure: Left Heart Cath and Coronary Angiography;  Surgeon: Sherren Mocha, MD;  Location: [facility] [facility];  Service: Cardiovascular;  Laterality: N/A;  . COLONOSCOPY  2014   normal   . MEDIAL PARTIAL KNEE REPLACEMENT Bilateral 2006  . OVARIAN CYST REMOVAL  2008   Pt states she had a ?cyst removed ?ovaries  . POLYPECTOMY  1998    Family History  Problem Relation Age of Onset  .  Arthritis Mother   . Hyperlipidemia Mother   . Hypertension Mother   . Diabetes Mother   . Heart disease Mother   . Breast cancer Mother   . Lung cancer Brother   . Heart disease Brother        x 2  . Hypertension Brother   . Hyperlipidemia Brother   . Diabetes Brother   . Sudden death Father   . Lung cancer Father   . Rectal cancer Father   . Diabetes Sister   . Hyperlipidemia Sister   . Hypertension Sister   . Diabetes Maternal Aunt   . Heart attack Maternal Aunt   . Hyperlipidemia Maternal Aunt   . Hypertension Maternal Aunt   . Hypertension Maternal Uncle   . Hyperlipidemia Maternal Uncle   . Heart attack Maternal Uncle   . Diabetes Maternal Uncle   . Diabetes Paternal Aunt   . Heart attack Paternal Aunt   . Hyperlipidemia Paternal Aunt   . Hypertension Paternal Aunt   . Hypertension Paternal Uncle   . Hyperlipidemia Paternal Uncle   . Heart attack Paternal Uncle   . Diabetes Paternal Uncle   . Prostate cancer  Brother     Allergies  Allergen Reactions  . Erythromycin Swelling    "all mycins"  . Losartan     hyperkalemia  . Prednisone Other (See Comments)    "Insomnia" and "makes me crazy"    Current Outpatient Prescriptions on File Prior to Visit  Medication Sig Dispense Refill  . ALPRAZolam (XANAX) 1 MG tablet Take 0.5 tablets (0.5 mg total) by mouth at bedtime as needed for anxiety. 28 tablet 0  . AMBULATORY NON FORMULARY MEDICATION Medication Name: Compounded Budesonide 5 mg Please take 10 mg daily 60 capsule 2  . amLODipine (NORVASC) 10 MG tablet Take 1 tablet (10 mg total) by mouth daily. 90 tablet 1  . aspirin 81 MG tablet Take 81 mg by mouth daily.      Marland Kitchen atorvastatin (LIPITOR) 40 MG tablet Take 1 tablet (40 mg total) by mouth daily. 90 tablet 1  . budesonide (ENTOCORT EC) 3 MG 24 hr capsule Take 3 capsules (9 mg total) by mouth daily. Stop after 8 weeks total treatment (2 rxs) 90 capsule 0  . Calcium Carbonate-Vitamin D (CALCIUM-D) 600-400 MG-UNIT TABS Take 1 tablet by mouth daily.     . Coenzyme Q10 (CO Q 10) 100 MG CAPS Take 100 mg by mouth daily.    . diphenhydrAMINE (BENADRYL) 25 MG tablet Take 25 mg by mouth every 6 (six) hours as needed for itching.     . diphenoxylate-atropine (LOMOTIL) 2.5-0.025 MG tablet Take 1 tablet by mouth 4 (four) times daily as needed for diarrhea or loose stools (may take 2 tabs). 90 tablet 0  . escitalopram (LEXAPRO) 20 MG tablet Take 1 tablet (20 mg total) by mouth daily. 90 tablet 1  . gabapentin (NEURONTIN) 300 MG capsule Take 1 capsule (300 mg total) by mouth 3 (three) times daily. 90 capsule 1  . liraglutide (VICTOZA) 18 MG/3ML SOPN Inject 1.8mg  once daily 9 mL 1  . Magnesium Oxide 400 (240 Mg) MG TABS Take 1 tablet by mouth daily. 30 tablet   . meclizine (ANTIVERT) 25 MG tablet Take 1 tablet (25 mg total) by mouth 3 (three) times daily as needed for dizziness. 30 tablet 0  . metFORMIN (GLUCOPHAGE) 500 MG tablet TAKE TWO TABLETS BY MOUTH TWICE  DAILY WITH MEALS 120 tablet 1  . metoprolol succinate (TOPROL-XL)  50 MG 24 hr tablet Take 1 tablet (50 mg total) by mouth daily. Take with or immediately following a meal. 90 tablet 1  . Multiple Vitamin (MULTIVITAMIN) tablet Take 1 tablet by mouth daily.      . Omega-3 Fatty Acids (FISH OIL) 1000 MG CAPS 2 caps by mouth twice daily (Patient taking differently: Take 2,000 mg by mouth 2 (two) times daily. )  0  . ondansetron (ZOFRAN ODT) 4 MG disintegrating tablet Take 1 tablet (4 mg total) by mouth every 8 (eight) hours as needed for nausea or vomiting. 20 tablet 0  . ondansetron (ZOFRAN) 4 MG tablet TAKE ONE TABLET BY MOUTH EVERY 8 HOURS AS NEEDED FOR NAUSEA AND VOMITING 20 tablet 0  . pantoprazole (PROTONIX) 40 MG tablet Take 1 tablet (40 mg total) by mouth daily. 90 tablet 1  . venlafaxine XR (EFFEXOR-XR) 75 MG 24 hr capsule TAKE TWO CAPSULES BY MOUTH ONCE DAILY 180 capsule 1   No current facility-administered medications on file prior to visit.     BP (!) 153/86 (BP Location: Right Arm, Cuff Size: Large)   Pulse 85   Temp 98.6 F (37 C) (Oral)   Resp 16   Ht 5\' 4"  (1.626 m)   Wt 186 lb 9.6 oz (84.6 kg)   SpO2 99%   BMI 32.03 kg/m    Objective:   Physical Exam  Constitutional: She is oriented to person, place, and time. She appears well-developed and well-nourished.  HENT:  Head: Normocephalic and atraumatic.  Cardiovascular: Normal rate, regular rhythm and normal heart sounds.   No murmur heard. Pulmonary/Chest: Effort normal and breath sounds normal. No respiratory distress. She has no wheezes.  Musculoskeletal: She exhibits no edema.  Neurological: She is alert and oriented to person, place, and time.  Psychiatric: She has a normal mood and affect. Her behavior is normal. Judgment and thought content normal.          Assessment & Plan:  Insomnia- I advised her that  Xanax is considered high risk medication in people over age 92.  Due to her history of falls I advised  her that it would be ideal to discontinue medication altogether, however for now I recommend that she decrease the dose from 1 mg to half milligram at bedtime.  She was not pleased with this recommendation but I advised her that it was in her best interest from a safety standpoint.  Will obtain follow-up urine drug screen per controlled substance contract.  Diabetes type 2- Maintained on Victoza. will obtain follow-up A1c as well as a basic metabolic panel.  Hyperlipidemia-maintained on statin, obtain follow-up lipid panel.  Obstructive sleep apnea- reports good compliance with her CPAP with the exception of a trip out of town where she forgot to bring it.  She reports that her mask is fitting well and she is waking up feeling more refreshed in the mornings.  We will request a download report from her CPAP supplier.

## 2016-12-12 NOTE — Patient Instructions (Signed)
Please complete lab work prior to leaving.   

## 2016-12-16 ENCOUNTER — Telehealth: Payer: Self-pay | Admitting: Family

## 2016-12-16 LAB — PAIN MGMT, PROFILE 8 W/CONF, U
6 ACETYLMORPHINE: NEGATIVE ng/mL (ref <10)
ALPHAHYDROXYTRIAZOLAM: NEGATIVE ng/mL (ref <50)
AMINOCLONAZEPAM: NEGATIVE ng/mL (ref <25)
AMPHETAMINES: NEGATIVE ng/mL (ref <500)
Alcohol Metabolites: POSITIVE ng/mL — AB (ref <500)
Alphahydroxyalprazolam: 178 ng/mL — ABNORMAL HIGH (ref <25)
Alphahydroxymidazolam: NEGATIVE ng/mL (ref <50)
BUPRENORPHINE, URINE: NEGATIVE ng/mL (ref <5)
Benzodiazepines: POSITIVE ng/mL — AB (ref <100)
Cocaine Metabolite: NEGATIVE ng/mL (ref <150)
Creatinine: 125.2 mg/dL
Ethyl Glucuronide (ETG): 72305 ng/mL — ABNORMAL HIGH (ref <500)
Ethyl Sulfate (ETS): 11583 ng/mL — ABNORMAL HIGH (ref <100)
HYDROXYETHYLFLURAZEPAM: NEGATIVE ng/mL (ref <50)
Lorazepam: NEGATIVE ng/mL (ref <50)
MARIJUANA METABOLITE: NEGATIVE ng/mL (ref <20)
MDMA: NEGATIVE ng/mL (ref <500)
Nordiazepam: NEGATIVE ng/mL (ref <50)
OPIATES: NEGATIVE ng/mL (ref <100)
OXAZEPAM: NEGATIVE ng/mL (ref <50)
OXIDANT: NEGATIVE ug/mL (ref <200)
OXYCODONE: NEGATIVE ng/mL (ref <100)
TEMAZEPAM: NEGATIVE ng/mL (ref <50)
pH: 7.06 (ref 4.5–9.0)

## 2016-12-16 MED ORDER — METFORMIN HCL 1000 MG PO TABS: 1000.0000 mg | ORAL_TABLET | Freq: Two times a day (BID) | ORAL | 3 refills | Status: DC

## 2016-12-16 NOTE — Telephone Encounter (Signed)
A1C is up, please increase metformin to 1000mg  bid.  UDS + for alcohol. Please advise her to avoid mixing alcohol and xanax.

## 2016-12-17 NOTE — Telephone Encounter (Signed)
Notified pt and she voices understanding. 

## 2016-12-18 DIAGNOSIS — Z6833 Body mass index (BMI) 33.0-33.9, adult: Secondary | ICD-10-CM | POA: Diagnosis not present

## 2016-12-18 DIAGNOSIS — I10 Essential (primary) hypertension: Secondary | ICD-10-CM | POA: Diagnosis not present

## 2016-12-18 DIAGNOSIS — M4727 Other spondylosis with radiculopathy, lumbosacral region: Secondary | ICD-10-CM | POA: Diagnosis not present

## 2016-12-18 DIAGNOSIS — M545 Low back pain: Secondary | ICD-10-CM | POA: Diagnosis not present

## 2016-12-22 ENCOUNTER — Ambulatory Visit: Admitting: Family

## 2016-12-22 ENCOUNTER — Telehealth: Payer: Self-pay | Admitting: *Deleted

## 2016-12-22 NOTE — Telephone Encounter (Signed)
Received Request for Supporting Documentation on CPAP/BIPAP Device from [facility]; forwarded to provider/SLS 11/12

## 2017-01-28 DIAGNOSIS — H401131 Primary open-angle glaucoma, bilateral, mild stage: Secondary | ICD-10-CM | POA: Diagnosis not present

## 2017-01-28 DIAGNOSIS — H43813 Vitreous degeneration, bilateral: Secondary | ICD-10-CM | POA: Diagnosis not present

## 2017-01-28 DIAGNOSIS — E119 Type 2 diabetes mellitus without complications: Secondary | ICD-10-CM | POA: Diagnosis not present

## 2017-01-28 DIAGNOSIS — H04123 Dry eye syndrome of bilateral lacrimal glands: Secondary | ICD-10-CM | POA: Diagnosis not present

## 2017-01-28 DIAGNOSIS — H2513 Age-related nuclear cataract, bilateral: Secondary | ICD-10-CM | POA: Diagnosis not present

## 2017-02-05 ENCOUNTER — Telehealth: Payer: Self-pay | Admitting: *Deleted

## 2017-02-05 NOTE — Telephone Encounter (Signed)
Received Physician Orders from [facility]; forwarded to provider/SLS 12/27

## 2017-02-12 DIAGNOSIS — M5417 Radiculopathy, lumbosacral region: Secondary | ICD-10-CM | POA: Diagnosis not present

## 2017-02-12 DIAGNOSIS — M4727 Other spondylosis with radiculopathy, lumbosacral region: Secondary | ICD-10-CM | POA: Diagnosis not present

## 2017-02-23 ENCOUNTER — Ambulatory Visit (INDEPENDENT_AMBULATORY_CARE_PROVIDER_SITE_OTHER): Payer: Medicare Other | Admitting: Nurse Practitioner

## 2017-02-23 ENCOUNTER — Encounter: Payer: Self-pay | Admitting: Family

## 2017-02-23 ENCOUNTER — Encounter: Payer: Self-pay | Admitting: Nurse Practitioner

## 2017-02-23 VITALS — BP 160/80 | HR 84 | Ht 64.0 in | Wt 181.0 lb

## 2017-02-23 DIAGNOSIS — R197 Diarrhea, unspecified: Secondary | ICD-10-CM

## 2017-02-23 DIAGNOSIS — K52839 Microscopic colitis, unspecified: Secondary | ICD-10-CM

## 2017-02-23 MED ORDER — BUDESONIDE 3 MG PO CPEP: 9.0000 mg | ORAL_CAPSULE | ORAL | 1 refills | Status: DC

## 2017-02-23 MED ORDER — METFORMIN HCL 1000 MG PO TABS: 1000.0000 mg | ORAL_TABLET | Freq: Two times a day (BID) | ORAL | 1 refills | Status: DC

## 2017-02-23 MED ORDER — PANTOPRAZOLE SODIUM 40 MG PO TBEC: 40.0000 mg | DELAYED_RELEASE_TABLET | Freq: Every day | ORAL | 1 refills | Status: DC

## 2017-02-23 NOTE — Telephone Encounter (Signed)
Please advise refill?  Last alprazolam RX: 11/06/16, #28. Last OV: 12/12/16 Next OV: none scheduled UDS: 12/12/16 CSC: not current CSR:  No discrepancies identified

## 2017-02-23 NOTE — Patient Instructions (Addendum)
If you are age 72 or older, your body mass index should be between 23-30. Your Body mass index is 31.07 kg/m. If this is out of the aforementioned range listed, please consider follow up with your Primary Care Provider.  If you are age 70 or younger, your body mass index should be between 19-25. Your Body mass index is 31.07 kg/m. If this is out of the aformentioned range listed, please consider follow up with your Primary Care Provider.   We have sent the following medications to your pharmacy for you to pick up at your convenience: Budesonide 3 mg  Take Imodium 2 mg as directed.  Follow up with Tye Savoy, NP in one month.  We will contact you with an appointment once the schedule becomes available.  Thank you for choosing me and [facility].   Tye Savoy, NP

## 2017-02-23 NOTE — Progress Notes (Addendum)
Chief Complaint: diarrhea  HPI: Patient is a 72 yo female known to Dr Carlean Purl. She has a hx of collagneous colitis diagnosed in 2014. I saw her in April 2018 for recurrent diarrhea in addition to bloating and RLQ pain. 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 CT scan done for evaluation of pain and it was unrevealing.  Dr. Carlean Purl saw her back for follow up in June and she reported improvement in diarrhea so he asked she continue the budesonide for another two months then discontinue it and see how things go.  She had some problems getting the medication.  For the phone notes it does not appear that she was able to take the budesonide for 2 additional consecutive months.  September we called in Lomotil after patient found in with complaints of ongoing bowel incontinence.  Patient comes back in today with ongoing diarrhea and incontinence.  When I inquired about budesonide she initially told me it did not really work well.  I reminded her of the conversations with Dr. Carlean Purl at her last office visit in patient.  In retrospect, patient cannot really remember if the budesonide was helpful. She continues to have frequent loose, urgent BM with some incontinence least once a week .She sometimes sees scant blood when wiping. She hasn't had any significant weight loss.  No abdominal pain.  Most days she averages 2 loose stools.  Her bad days may consist of 5-6 loose stools a day .  She is not taking much Imodium or Lomotil, did not know that it was okay to do so. Ms Schrecengost is frustrated. Hard to go places for fear of stool running down her legs.  No recent antibiotics.   Past Medical History:  Diagnosis Date  . Abdominal mass   . Abnormal nuclear stress test 07/05/2014  . Acute encephalopathy 03/26/2016  . Anxiety   . Arthritis    osteoarthritis  . Colitis 2014  . Depression   . Diabetes mellitus   . Fibromyalgia   . GERD (gastroesophageal reflux disease)   .  Hiatal hernia   . History of chicken pox   . History of septic shock 12/2009  . Hyperlipidemia   . Hypertension   . Migraine   . OSA (obstructive sleep apnea) 03/12/2011  . Personal history of colonic polyps   . Sepsis secondary to UTI ([place]) 03/26/2016  . Spinal stenosis     Patient's surgical history, family medical history, social history, medications and allergies were all reviewed in Epic    Physical Exam: BP (!) 160/80   Pulse 84   Ht 5\' 4"  (1.626 m)   Wt 181 lb (82.1 kg)   BMI 31.07 kg/m   GENERAL:  Pleasant obese white female in NAD PSYCH: :Pleasant, cooperative, normal affect EENT:  conjunctiva pink, mucous membranes moist, neck supple without masses CARDIAC:  RRR, no peripheral edema PULM: Normal respiratory effort, lungs CTA bilaterally, no wheezing ABDOMEN:  Nondistended, soft, nontender. No obvious masses, no hepatomegaly,  normal bowel sounds RECTAL: Decreased sphincter tone, reduced squeezing pressure SKIN:  turgor, no lesions seen Musculoskeletal:  Normal muscle tone, normal strength NEURO: Alert and oriented x 3, no focal neurologic deficits    ASSESSMENT and PLAN:  Pleasant 72 year old female with chronic intermittent diarrhea and hx of collagenous colitis diagnosed 2014 at time of last colonoscopy. In the last 6 months she has been treated with Budesonide but had problems getting medication so the 2-3 months of  treatment were not consecutive. She reported a good response to budesonide when we saw her in June but today she says it wasn't helpful. In retrospect, she doesn't really remember. Currently averaging two loose stools a day, on a bad day she could have 5-6.  She is mainly bothered by episodes of fecal incontinence . Poor sphincter tone certainly contributing to the incontinence. She has not been using anti-diarrheals agents on a regular basis.  -Agrees to try one more course of budesonide 9 mg daily.  I have asked her to keep records of her progress so  that we can have a definite answer about treatment response.  For now, she will also take Imodium 2-3 times a day to help get the diarrhea under control -I will see her in follow up in a few weeks. If truly not responding to treatment then may need further workup.   Tye Savoy , NP 02/23/2017, 2:22 PM   Agree with Ms. Zavia Pullen's assessment and plan.  Need to sort out if she is refractory to budesonide or if compliance and poor recall are main issues.  It sounds like she may be one that needs a chronic Tx w/ budesonide at lowest effective dose.  Gatha Mayer, MD, [facility]

## 2017-02-24 ENCOUNTER — Encounter: Payer: Self-pay | Admitting: Nurse Practitioner

## 2017-02-24 NOTE — Telephone Encounter (Signed)
See order(s).

## 2017-02-25 ENCOUNTER — Other Ambulatory Visit: Payer: Self-pay

## 2017-02-25 ENCOUNTER — Encounter: Payer: Self-pay | Admitting: Nurse Practitioner

## 2017-02-25 ENCOUNTER — Other Ambulatory Visit: Payer: Self-pay | Admitting: Family

## 2017-02-25 MED ORDER — ALPRAZOLAM 1 MG PO TABS: 0.5000 mg | ORAL_TABLET | Freq: Every evening | ORAL | 0 refills | Status: DC | PRN

## 2017-02-25 MED ORDER — BUDESONIDE 3 MG PO CPEP: 9.0000 mg | ORAL_CAPSULE | ORAL | 1 refills | Status: DC

## 2017-03-02 ENCOUNTER — Encounter: Payer: Self-pay | Admitting: Family

## 2017-03-17 ENCOUNTER — Encounter: Payer: Self-pay | Admitting: Family

## 2017-03-17 DIAGNOSIS — M545 Low back pain: Secondary | ICD-10-CM | POA: Diagnosis not present

## 2017-03-17 DIAGNOSIS — Z6832 Body mass index (BMI) 32.0-32.9, adult: Secondary | ICD-10-CM | POA: Diagnosis not present

## 2017-03-17 DIAGNOSIS — I10 Essential (primary) hypertension: Secondary | ICD-10-CM | POA: Diagnosis not present

## 2017-03-17 DIAGNOSIS — M4727 Other spondylosis with radiculopathy, lumbosacral region: Secondary | ICD-10-CM | POA: Diagnosis not present

## 2017-03-17 DIAGNOSIS — M62838 Other muscle spasm: Secondary | ICD-10-CM | POA: Diagnosis not present

## 2017-03-17 MED ORDER — LIRAGLUTIDE 18 MG/3ML ~~LOC~~ SOPN: PEN_INJECTOR | SUBCUTANEOUS | 1 refills | Status: DC

## 2017-03-22 ENCOUNTER — Encounter: Payer: Self-pay | Admitting: Nurse Practitioner

## 2017-03-23 ENCOUNTER — Other Ambulatory Visit: Payer: Self-pay

## 2017-03-23 MED ORDER — BUDESONIDE 3 MG PO CPEP: 9.0000 mg | ORAL_CAPSULE | ORAL | 1 refills | Status: DC

## 2017-04-07 ENCOUNTER — Encounter: Payer: Self-pay | Admitting: Family

## 2017-04-08 NOTE — Telephone Encounter (Signed)
Please contact patient and advise her that I reviewed the [facility] registry.  I see tha oxycodone by another provider.  This is a medication that she plans to continue long-term?  She should not take alprazolam with oxycodone.  If she is going to continue oxycodone we will need to discontinue the alprazolam.

## 2017-04-08 NOTE — Telephone Encounter (Signed)
Left message for pt to return my call.

## 2017-04-08 NOTE — Telephone Encounter (Signed)
Requesting: alprazolam Contract: 12/12/14 needs updated UDS:12/12/16 Last Visit:12/12/16 Next Visit: none Last Refill:02/25/17  Please Advise

## 2017-04-13 NOTE — Telephone Encounter (Signed)
Spoke with pt. She states she was prescribed oxycodone in 08/2016 and still has that same supply on hand. States she is also getting spinal injections and is considering a spinal stimulator further down the road.  Reports that she may take 1 oxycodone once a month if she does a lot extra walking. Please advise?

## 2017-04-14 MED ORDER — ALPRAZOLAM 1 MG PO TABS: 0.5000 mg | ORAL_TABLET | Freq: Every evening | ORAL | 0 refills | Status: DC | PRN

## 2017-04-14 NOTE — Telephone Encounter (Signed)
Sent patient mychart message

## 2017-04-14 NOTE — Telephone Encounter (Signed)
Noted.  Please advise her not to take xanax on the days she takes oxycodone.

## 2017-04-15 ENCOUNTER — Encounter: Payer: Self-pay | Admitting: Family

## 2017-04-15 ENCOUNTER — Telehealth: Payer: Self-pay | Admitting: Family

## 2017-04-15 NOTE — Telephone Encounter (Signed)
Reviewed [facility] controlled substance registry.  Registry reveals that she received an additional 45 tablets of oxycodone on February 27, 2017.  I reviewed this case with Dr. Charlett Blake  She has violated her controlled substance contract by receiving a controlled substance from another provider.  In addition she has lied to me about refill of her oxycodone.  Due to patient lying about her refills, will dimiss the patient due to lack of therapeutic relationship.

## 2017-04-17 NOTE — Telephone Encounter (Signed)
Opened in error

## 2017-04-20 ENCOUNTER — Telehealth: Payer: Self-pay | Admitting: Family

## 2017-04-20 ENCOUNTER — Encounter: Payer: Self-pay | Admitting: Family

## 2017-04-20 NOTE — Telephone Encounter (Signed)
Patient dismissed from [facility] by Debbrah Alar NP, effective April 15, 2017. Dismissal letter sent out by certified / registered mail.  daj

## 2017-04-24 ENCOUNTER — Encounter: Payer: Self-pay | Admitting: Family

## 2017-04-24 DIAGNOSIS — I10 Essential (primary) hypertension: Secondary | ICD-10-CM

## 2017-04-24 DIAGNOSIS — I2510 Atherosclerotic heart disease of native coronary artery without angina pectoris: Secondary | ICD-10-CM

## 2017-04-24 DIAGNOSIS — I2583 Coronary atherosclerosis due to lipid rich plaque: Principal | ICD-10-CM

## 2017-04-24 MED ORDER — ATORVASTATIN CALCIUM 40 MG PO TABS
40.0000 mg | ORAL_TABLET | Freq: Every day | ORAL | 0 refills | Status: DC
Start: 2017-04-24 — End: 2020-09-26

## 2017-04-24 MED ORDER — VENLAFAXINE HCL ER 75 MG PO CP24: ORAL_CAPSULE | ORAL | 0 refills | Status: AC

## 2017-04-24 MED ORDER — METOPROLOL SUCCINATE ER 50 MG PO TB24: 50.0000 mg | ORAL_TABLET | Freq: Every day | ORAL | 0 refills | Status: DC

## 2017-04-24 MED ORDER — AMLODIPINE BESYLATE 10 MG PO TABS: 10.0000 mg | ORAL_TABLET | Freq: Every day | ORAL | 0 refills | Status: AC

## 2017-05-14 DIAGNOSIS — M4727 Other spondylosis with radiculopathy, lumbosacral region: Secondary | ICD-10-CM | POA: Diagnosis not present

## 2017-05-14 DIAGNOSIS — M5417 Radiculopathy, lumbosacral region: Secondary | ICD-10-CM | POA: Diagnosis not present

## 2017-05-18 ENCOUNTER — Encounter: Payer: Self-pay | Admitting: Nurse Practitioner

## 2017-05-19 ENCOUNTER — Telehealth: Payer: Self-pay | Admitting: *Deleted

## 2017-05-19 NOTE — Telephone Encounter (Signed)
Received Physician Orders for OSA supplies from [facility] [pt dismissed 04/15/17]; forwarded to provider/SLS 04/09

## 2017-05-21 NOTE — Telephone Encounter (Signed)
Received signed domestic return receipt verifying delivery of certified letter on April 22, 2017. Article number [phone] TWS

## 2017-05-27 DIAGNOSIS — K219 Gastro-esophageal reflux disease without esophagitis: Secondary | ICD-10-CM | POA: Diagnosis not present

## 2017-05-27 DIAGNOSIS — R3989 Other symptoms and signs involving the genitourinary system: Secondary | ICD-10-CM | POA: Diagnosis not present

## 2017-05-27 DIAGNOSIS — I10 Essential (primary) hypertension: Secondary | ICD-10-CM | POA: Diagnosis not present

## 2017-05-27 DIAGNOSIS — E119 Type 2 diabetes mellitus without complications: Secondary | ICD-10-CM | POA: Diagnosis not present

## 2017-05-27 DIAGNOSIS — M797 Fibromyalgia: Secondary | ICD-10-CM | POA: Diagnosis not present

## 2017-05-27 DIAGNOSIS — N898 Other specified noninflammatory disorders of vagina: Secondary | ICD-10-CM | POA: Diagnosis not present

## 2017-06-01 ENCOUNTER — Ambulatory Visit (INDEPENDENT_AMBULATORY_CARE_PROVIDER_SITE_OTHER): Payer: Medicare Other | Admitting: Nurse Practitioner

## 2017-06-01 ENCOUNTER — Ambulatory Visit (INDEPENDENT_AMBULATORY_CARE_PROVIDER_SITE_OTHER)
Admission: RE | Admit: 2017-06-01 | Discharge: 2017-06-01 | Disposition: A | Discharge status: Home / Self Care | Payer: Medicare Other | Source: Ambulatory Visit | Attending: Nurse Practitioner | Admitting: Nurse Practitioner

## 2017-06-01 ENCOUNTER — Encounter: Payer: Self-pay | Admitting: Nurse Practitioner

## 2017-06-01 ENCOUNTER — Other Ambulatory Visit (INDEPENDENT_AMBULATORY_CARE_PROVIDER_SITE_OTHER): Payer: Medicare Other

## 2017-06-01 VITALS — BP 142/80 | HR 96 | Wt 184.0 lb

## 2017-06-01 DIAGNOSIS — R197 Diarrhea, unspecified: Secondary | ICD-10-CM | POA: Diagnosis not present

## 2017-06-01 DIAGNOSIS — I2510 Atherosclerotic heart disease of native coronary artery without angina pectoris: Secondary | ICD-10-CM

## 2017-06-01 DIAGNOSIS — I2583 Coronary atherosclerosis due to lipid rich plaque: Secondary | ICD-10-CM | POA: Diagnosis not present

## 2017-06-01 DIAGNOSIS — R0781 Pleurodynia: Secondary | ICD-10-CM

## 2017-06-01 DIAGNOSIS — K760 Fatty (change of) liver, not elsewhere classified: Secondary | ICD-10-CM

## 2017-06-01 DIAGNOSIS — S2232XA Fracture of one rib, left side, initial encounter for closed fracture: Secondary | ICD-10-CM | POA: Diagnosis not present

## 2017-06-01 LAB — HEPATIC FUNCTION PANEL
ALBUMIN: 4.2 g/dL (ref 3.5–5.2)
ALK PHOS: 60 U/L (ref 39–117)
ALT: 28 U/L (ref 0–35)
AST: 23 U/L (ref 0–37)
Bilirubin, Direct: 0.1 mg/dL (ref 0.0–0.3)
TOTAL PROTEIN: 7.3 g/dL (ref 6.0–8.3)
Total Bilirubin: 0.8 mg/dL (ref 0.2–1.2)

## 2017-06-01 NOTE — Patient Instructions (Signed)
If you are age 72 or older, your body mass index should be between 23-30. Your Body mass index is 31.58 kg/m. If this is out of the aforementioned range listed, please consider follow up with your Primary Care Provider.  If you are age 71 or younger, your body mass index should be between 19-25. Your Body mass index is 31.58 kg/m. If this is out of the aformentioned range listed, please consider follow up with your Primary Care Provider.   Your provider has requested that you go to the basement level for an X-ray before leaving today. Press "B" on the elevator.   Your provider has requested that you go to the basement level for lab work before leaving today. Press "B" on the elevator. The lab is located at the first door on the left as you exit the elevator. LFT  Thank you for choosing me and [facility].   Tye Savoy, NP

## 2017-06-01 NOTE — Progress Notes (Addendum)
IMPRESSION and PLAN:    #1. Hx of microscopic colitis. Responded to a month of Budesonide. Off med now and doing fairly well. - If recurrent diarrhea we may try maintenance dose of Budesonide (maybe 3 -6 mg daily). She will let us know.     #2. Fatty liver disease. This has been seen on CT scans. - Reviewed liver chemistries done over last several years and they have been normal except for some mild elevation of transaminases during a hospital admission in 2016.  -Last check of liver enzymes was a year ago, will recheck today. She tried a 1200 calorie diet for a long time but was unsuccessful in losing any weight loss. I offered a referral to a dietician but she isn't interested right now.  -We discussed importance of good glucose control.  -Last check of triglycerides in Nov 2018 was normal.   #3. Left anterior rib cage (lower) pain / tenderness. Very tender on exam. Hx of fibromyalgia.  -will check left rib xray. If normal then she will need to discuss with her PCP at upcoming visit in May.   Agree with Ms. Janne Faulk's assessment and plan. Gatha Mayer, MD, [facility]    HPI:    Chief Complaint: follow up on diarrhea   Patient is a 67 old female known to Dr. Carlean Purl for history of microscopic colitis.  I saw her in January of this year with diarrhea.  Patient was started on budesonide and within a couple of weeks was able to discontinue Imodium except for just occasional use.  I believe she was given a month supply of budesonide then it sounds like she had problems getting refills through [facility].  About 4 days after running out of the medication she had a horrible night of explosive diarrhea.  Following that isolated episode her bowel movements pretty much normalized for her.  Currently she is only having 1-2 bowel movements a day and that is without needing any Imodium.   Keyoni ask about fatty liver disease.  She has been diagnosed with fatty liver disease but never really  learned that much about it.  We reviewed her liver labs through the years as well as a few of her CT scan showing diffuse hepatic steatosis.   Lisandra gives a several month history of left lateral pain with occasional bulging.  She really notices the pain at night when rolling over to her left side.  Winston recently acquired a new PCP but did not think to mention the pain to him.  He does not call injuring herself    Review of systems:    No chest pain, sob. No urinary sx   Past Medical History:  Diagnosis Date  . Abdominal mass   . Abnormal nuclear stress test 07/05/2014  . Acute encephalopathy 03/26/2016  . Anxiety   . Arthritis    osteoarthritis  . Colitis 2014  . Depression   . Diabetes mellitus   . Fibromyalgia   . GERD (gastroesophageal reflux disease)   . Hiatal hernia   . History of chicken pox   . History of septic shock 12/2009  . Hyperlipidemia   . Hypertension   . Migraine   . OSA (obstructive sleep apnea) 03/12/2011  . Personal history of colonic polyps   . Sepsis secondary to UTI ([place]) 03/26/2016  . Spinal stenosis     Patient's surgical history, family medical history, social history, medications and allergies were all reviewed in Epic  Physical Exam:     BP (!) 142/80   Pulse 96   Wt 184 lb (83.5 kg)   BMI 31.58 kg/m   GENERAL:  White female in NAD PSYCH: :Pleasant, cooperative, normal affect EENT:  conjunctiva pink, mucous membranes moist, neck supple without masses CARDIAC:  RRR,  No murmur heard, no peripheral edema PULM: Normal respiratory effort, lungs CTA bilaterally, no wheezing ABDOMEN:  Nondistended, soft, nontender. No obvious masses,  normal bowel sounds SKIN:  turgor, no lesions seen Musculoskeletal:  Normal muscle tone, normal strength. Significant tenderness over left lower anterior rib cage  NEURO: Alert and oriented x 3, no focal neurologic deficits   Tye Savoy , NP 06/01/2017, 3:34 PM

## 2017-06-09 DIAGNOSIS — M79672 Pain in left foot: Secondary | ICD-10-CM | POA: Diagnosis not present

## 2017-06-09 DIAGNOSIS — L84 Corns and callosities: Secondary | ICD-10-CM | POA: Diagnosis not present

## 2017-06-10 DIAGNOSIS — M129 Arthropathy, unspecified: Secondary | ICD-10-CM | POA: Diagnosis not present

## 2017-06-10 DIAGNOSIS — R3989 Other symptoms and signs involving the genitourinary system: Secondary | ICD-10-CM | POA: Diagnosis not present

## 2017-06-10 DIAGNOSIS — E559 Vitamin D deficiency, unspecified: Secondary | ICD-10-CM | POA: Diagnosis not present

## 2017-06-10 DIAGNOSIS — E119 Type 2 diabetes mellitus without complications: Secondary | ICD-10-CM | POA: Diagnosis not present

## 2017-06-10 DIAGNOSIS — R300 Dysuria: Secondary | ICD-10-CM | POA: Diagnosis not present

## 2017-06-10 DIAGNOSIS — R0602 Shortness of breath: Secondary | ICD-10-CM | POA: Diagnosis not present

## 2017-06-10 DIAGNOSIS — H401131 Primary open-angle glaucoma, bilateral, mild stage: Secondary | ICD-10-CM | POA: Diagnosis not present

## 2017-06-10 DIAGNOSIS — H524 Presbyopia: Secondary | ICD-10-CM | POA: Diagnosis not present

## 2017-06-10 DIAGNOSIS — H04123 Dry eye syndrome of bilateral lacrimal glands: Secondary | ICD-10-CM | POA: Diagnosis not present

## 2017-06-10 DIAGNOSIS — H5203 Hypermetropia, bilateral: Secondary | ICD-10-CM | POA: Diagnosis not present

## 2017-06-10 DIAGNOSIS — R5383 Other fatigue: Secondary | ICD-10-CM | POA: Diagnosis not present

## 2017-06-10 DIAGNOSIS — Z0000 Encounter for general adult medical examination without abnormal findings: Secondary | ICD-10-CM | POA: Diagnosis not present

## 2017-06-10 DIAGNOSIS — H43813 Vitreous degeneration, bilateral: Secondary | ICD-10-CM | POA: Diagnosis not present

## 2017-06-23 ENCOUNTER — Other Ambulatory Visit (HOSPITAL_BASED_OUTPATIENT_CLINIC_OR_DEPARTMENT_OTHER): Payer: Self-pay | Admitting: Family Medicine

## 2017-06-23 DIAGNOSIS — Z1231 Encounter for screening mammogram for malignant neoplasm of breast: Secondary | ICD-10-CM

## 2017-06-23 DIAGNOSIS — Z780 Asymptomatic menopausal state: Secondary | ICD-10-CM | POA: Diagnosis not present

## 2017-06-24 DIAGNOSIS — R635 Abnormal weight gain: Secondary | ICD-10-CM | POA: Diagnosis not present

## 2017-06-24 DIAGNOSIS — E119 Type 2 diabetes mellitus without complications: Secondary | ICD-10-CM | POA: Diagnosis not present

## 2017-06-24 DIAGNOSIS — I10 Essential (primary) hypertension: Secondary | ICD-10-CM | POA: Diagnosis not present

## 2017-06-24 DIAGNOSIS — Z780 Asymptomatic menopausal state: Secondary | ICD-10-CM | POA: Diagnosis not present

## 2017-06-25 DIAGNOSIS — R635 Abnormal weight gain: Secondary | ICD-10-CM | POA: Diagnosis not present

## 2017-06-25 DIAGNOSIS — M79605 Pain in left leg: Secondary | ICD-10-CM | POA: Diagnosis not present

## 2017-06-25 DIAGNOSIS — M79604 Pain in right leg: Secondary | ICD-10-CM | POA: Diagnosis not present

## 2017-06-25 DIAGNOSIS — E119 Type 2 diabetes mellitus without complications: Secondary | ICD-10-CM | POA: Diagnosis not present

## 2017-06-30 ENCOUNTER — Ambulatory Visit (HOSPITAL_BASED_OUTPATIENT_CLINIC_OR_DEPARTMENT_OTHER)
Admission: RE | Admit: 2017-06-30 | Discharge: 2017-06-30 | Disposition: A | Discharge status: Home / Self Care | Payer: Medicare Other | Source: Ambulatory Visit | Attending: Family Medicine | Admitting: Family Medicine

## 2017-06-30 ENCOUNTER — Encounter (HOSPITAL_BASED_OUTPATIENT_CLINIC_OR_DEPARTMENT_OTHER): Payer: Self-pay

## 2017-06-30 DIAGNOSIS — Z1231 Encounter for screening mammogram for malignant neoplasm of breast: Secondary | ICD-10-CM | POA: Diagnosis not present

## 2017-07-08 DIAGNOSIS — N390 Urinary tract infection, site not specified: Secondary | ICD-10-CM | POA: Diagnosis not present

## 2017-07-08 DIAGNOSIS — R635 Abnormal weight gain: Secondary | ICD-10-CM | POA: Diagnosis not present

## 2017-07-08 DIAGNOSIS — R3989 Other symptoms and signs involving the genitourinary system: Secondary | ICD-10-CM | POA: Diagnosis not present

## 2017-07-20 DIAGNOSIS — R3989 Other symptoms and signs involving the genitourinary system: Secondary | ICD-10-CM | POA: Diagnosis not present

## 2017-07-20 DIAGNOSIS — J40 Bronchitis, not specified as acute or chronic: Secondary | ICD-10-CM | POA: Diagnosis not present

## 2017-07-20 DIAGNOSIS — R05 Cough: Secondary | ICD-10-CM | POA: Diagnosis not present

## 2017-07-27 DIAGNOSIS — L821 Other seborrheic keratosis: Secondary | ICD-10-CM | POA: Diagnosis not present

## 2017-07-27 DIAGNOSIS — R3989 Other symptoms and signs involving the genitourinary system: Secondary | ICD-10-CM | POA: Diagnosis not present

## 2017-07-27 DIAGNOSIS — J40 Bronchitis, not specified as acute or chronic: Secondary | ICD-10-CM | POA: Diagnosis not present

## 2017-07-27 DIAGNOSIS — R05 Cough: Secondary | ICD-10-CM | POA: Diagnosis not present

## 2017-08-03 DIAGNOSIS — M4800 Spinal stenosis, site unspecified: Secondary | ICD-10-CM | POA: Diagnosis not present

## 2017-08-03 DIAGNOSIS — G4700 Insomnia, unspecified: Secondary | ICD-10-CM | POA: Diagnosis not present

## 2017-08-03 DIAGNOSIS — I10 Essential (primary) hypertension: Secondary | ICD-10-CM | POA: Diagnosis not present

## 2017-08-03 DIAGNOSIS — J40 Bronchitis, not specified as acute or chronic: Secondary | ICD-10-CM | POA: Diagnosis not present

## 2017-08-10 ENCOUNTER — Encounter: Payer: Self-pay | Admitting: Nurse Practitioner

## 2017-08-12 ENCOUNTER — Telehealth: Payer: Self-pay

## 2017-08-12 ENCOUNTER — Encounter: Payer: Self-pay | Admitting: Nurse Practitioner

## 2017-08-12 NOTE — Telephone Encounter (Signed)
Ok, well of course we need to check for c-diff please. If negative then ? Relapsing microscopic colitis. Thanks

## 2017-08-12 NOTE — Telephone Encounter (Signed)
Last seen in April.  Patient complaining of multiple watery stools since 08/07/17. She had incontinence in the night "a couple of times." She describes the bowel movements as liquid without blood. Denies fever, abdominal discomfort, and nausea. She has been on recent antibiotics for treatment of pneumonia.

## 2017-08-14 ENCOUNTER — Other Ambulatory Visit: Payer: Self-pay

## 2017-08-14 DIAGNOSIS — R197 Diarrhea, unspecified: Secondary | ICD-10-CM

## 2017-08-14 NOTE — Telephone Encounter (Signed)
Message left on her voicemail and sent via the patient portal of her My Chart account. Order is in [place].

## 2017-08-17 ENCOUNTER — Telehealth: Payer: Self-pay | Admitting: Nurse Practitioner

## 2017-08-17 ENCOUNTER — Encounter: Payer: Self-pay | Admitting: Nurse Practitioner

## 2017-08-17 NOTE — Telephone Encounter (Signed)
I called the patient. She will submit the stool specimen for the C Diff testing today. Please see the previous notes.

## 2017-08-18 ENCOUNTER — Other Ambulatory Visit: Payer: Medicare Other

## 2017-08-18 DIAGNOSIS — R197 Diarrhea, unspecified: Secondary | ICD-10-CM

## 2017-08-19 ENCOUNTER — Telehealth: Payer: Self-pay | Admitting: Nurse Practitioner

## 2017-08-19 NOTE — Telephone Encounter (Signed)
Patient advised her specimen she has already submitted is being processed. There doesn't seem to be a reason to resubmit at this time.

## 2017-08-20 ENCOUNTER — Encounter: Payer: Self-pay | Admitting: Nurse Practitioner

## 2017-08-20 ENCOUNTER — Emergency Department (HOSPITAL_BASED_OUTPATIENT_CLINIC_OR_DEPARTMENT_OTHER): Payer: Medicare Other

## 2017-08-20 ENCOUNTER — Emergency Department (HOSPITAL_BASED_OUTPATIENT_CLINIC_OR_DEPARTMENT_OTHER)
Admission: EM | Admit: 2017-08-20 | Discharge: 2017-08-21 | Disposition: A | Discharge status: Home / Self Care | Payer: Medicare Other | Attending: Emergency Medicine | Admitting: Emergency Medicine

## 2017-08-20 ENCOUNTER — Other Ambulatory Visit: Payer: Self-pay

## 2017-08-20 ENCOUNTER — Telehealth: Payer: Self-pay

## 2017-08-20 ENCOUNTER — Encounter (HOSPITAL_BASED_OUTPATIENT_CLINIC_OR_DEPARTMENT_OTHER): Payer: Self-pay | Admitting: Emergency Medicine

## 2017-08-20 DIAGNOSIS — I10 Essential (primary) hypertension: Secondary | ICD-10-CM | POA: Insufficient documentation

## 2017-08-20 DIAGNOSIS — Z79899 Other long term (current) drug therapy: Secondary | ICD-10-CM | POA: Insufficient documentation

## 2017-08-20 DIAGNOSIS — K5730 Diverticulosis of large intestine without perforation or abscess without bleeding: Secondary | ICD-10-CM | POA: Diagnosis not present

## 2017-08-20 DIAGNOSIS — R1110 Vomiting, unspecified: Secondary | ICD-10-CM | POA: Diagnosis not present

## 2017-08-20 DIAGNOSIS — R197 Diarrhea, unspecified: Secondary | ICD-10-CM | POA: Diagnosis not present

## 2017-08-20 DIAGNOSIS — Z7984 Long term (current) use of oral hypoglycemic drugs: Secondary | ICD-10-CM | POA: Insufficient documentation

## 2017-08-20 DIAGNOSIS — Z87891 Personal history of nicotine dependence: Secondary | ICD-10-CM | POA: Diagnosis not present

## 2017-08-20 DIAGNOSIS — E119 Type 2 diabetes mellitus without complications: Secondary | ICD-10-CM | POA: Diagnosis not present

## 2017-08-20 DIAGNOSIS — Z7982 Long term (current) use of aspirin: Secondary | ICD-10-CM | POA: Insufficient documentation

## 2017-08-20 DIAGNOSIS — R109 Unspecified abdominal pain: Secondary | ICD-10-CM | POA: Insufficient documentation

## 2017-08-20 LAB — CBC WITH DIFFERENTIAL/PLATELET
Basophils Absolute: 0 10*3/uL (ref 0.0–0.1)
Basophils Relative: 0 %
Eosinophils Absolute: 0.5 10*3/uL (ref 0.0–0.7)
Eosinophils Relative: 4 %
HEMATOCRIT: 46.8 % — AB (ref 36.0–46.0)
HEMOGLOBIN: 16.5 g/dL — AB (ref 12.0–15.0)
LYMPHS ABS: 2.5 10*3/uL (ref 0.7–4.0)
LYMPHS PCT: 18 %
MCH: 31.1 pg (ref 26.0–34.0)
MCHC: 35.3 g/dL (ref 30.0–36.0)
MCV: 88.1 fL (ref 78.0–100.0)
Monocytes Absolute: 1.4 10*3/uL — ABNORMAL HIGH (ref 0.1–1.0)
Monocytes Relative: 10 %
NEUTROS ABS: 9.3 10*3/uL — AB (ref 1.7–7.7)
NEUTROS PCT: 68 %
Platelets: 343 10*3/uL (ref 150–400)
RBC: 5.31 MIL/uL — ABNORMAL HIGH (ref 3.87–5.11)
RDW: 13.7 % (ref 11.5–15.5)
WBC: 13.6 10*3/uL — ABNORMAL HIGH (ref 4.0–10.5)

## 2017-08-20 LAB — COMPREHENSIVE METABOLIC PANEL
ALT: 87 U/L — AB (ref 0–44)
ANION GAP: 18 — AB (ref 5–15)
AST: 109 U/L — ABNORMAL HIGH (ref 15–41)
Albumin: 5 g/dL (ref 3.5–5.0)
Alkaline Phosphatase: 66 U/L (ref 38–126)
BUN: 19 mg/dL (ref 8–23)
CHLORIDE: 97 mmol/L — AB (ref 98–111)
CO2: 25 mmol/L (ref 22–32)
Calcium: 10 mg/dL (ref 8.9–10.3)
Creatinine, Ser: 1.2 mg/dL — ABNORMAL HIGH (ref 0.44–1.00)
GFR calc non Af Amer: 44 mL/min — ABNORMAL LOW (ref >60)
GFR, EST AFRICAN AMERICAN: 51 mL/min — AB (ref >60)
Glucose, Bld: 163 mg/dL — ABNORMAL HIGH (ref 70–99)
POTASSIUM: 3.6 mmol/L (ref 3.5–5.1)
SODIUM: 140 mmol/L (ref 135–145)
Total Bilirubin: 1.3 mg/dL — ABNORMAL HIGH (ref 0.3–1.2)
Total Protein: 9.1 g/dL — ABNORMAL HIGH (ref 6.5–8.1)

## 2017-08-20 LAB — CLOSTRIDIUM DIFFICILE BY PCR: Toxigenic C. Difficile by PCR: NEGATIVE

## 2017-08-20 MED ORDER — ONDANSETRON HCL 4 MG/2ML IJ SOLN
4.0000 mg | Freq: Once | INTRAMUSCULAR | Status: AC
  Administered 2017-08-20: 4 mg via INTRAVENOUS
  Filled 2017-08-20: qty 2

## 2017-08-20 MED ORDER — SODIUM CHLORIDE 0.9 % IV BOLUS
1000.0000 mL | Freq: Once | INTRAVENOUS | Status: AC
  Administered 2017-08-20: 1000 mL via INTRAVENOUS

## 2017-08-20 NOTE — Telephone Encounter (Signed)
Patient has sent another email. She is reports she vomited today. This is new.  I have called the patient. She had eaten dry cereal. She vomited it back. No choking. Did not feel nauseous before she vomited.  I have instructed her on clear liquids, no solids, no dairy. Please advise on how to help this patient. See her other emails from the past 24 hours.

## 2017-08-20 NOTE — ED Triage Notes (Signed)
PT states she has been sick since July 1st  Pt states she has had diarrhea   Went to the dr and was tested for c diff which came back negative  Pt states she is unable to control her diarrhea  Today she started having vomiting  Pt has hx of colitis  Pt states she is unable to hold down gatorade or jello

## 2017-08-21 ENCOUNTER — Encounter (HOSPITAL_BASED_OUTPATIENT_CLINIC_OR_DEPARTMENT_OTHER): Payer: Self-pay | Admitting: Emergency Medicine

## 2017-08-21 DIAGNOSIS — K5730 Diverticulosis of large intestine without perforation or abscess without bleeding: Secondary | ICD-10-CM | POA: Diagnosis not present

## 2017-08-21 DIAGNOSIS — R197 Diarrhea, unspecified: Secondary | ICD-10-CM | POA: Diagnosis not present

## 2017-08-21 LAB — URINALYSIS, ROUTINE W REFLEX MICROSCOPIC
Bilirubin Urine: NEGATIVE
Glucose, UA: NEGATIVE mg/dL
Hgb urine dipstick: NEGATIVE
KETONES UR: 40 mg/dL — AB
Leukocytes, UA: NEGATIVE
NITRITE: NEGATIVE
PROTEIN: NEGATIVE mg/dL
Specific Gravity, Urine: 1.01 (ref 1.005–1.030)
pH: 7.5 (ref 5.0–8.0)

## 2017-08-21 MED ORDER — DICYCLOMINE HCL 10 MG/ML IM SOLN
20.0000 mg | Freq: Once | INTRAMUSCULAR | Status: AC
  Administered 2017-08-21: 20 mg via INTRAMUSCULAR
  Filled 2017-08-21: qty 2

## 2017-08-21 MED ORDER — SODIUM CHLORIDE 0.9 % IV BOLUS
500.0000 mL | Freq: Once | INTRAVENOUS | Status: AC
  Administered 2017-08-21: 500 mL via INTRAVENOUS

## 2017-08-21 MED ORDER — KETOROLAC TROMETHAMINE 30 MG/ML IJ SOLN
15.0000 mg | Freq: Once | INTRAMUSCULAR | Status: AC
  Administered 2017-08-21: 15 mg via INTRAVENOUS
  Filled 2017-08-21: qty 1

## 2017-08-21 MED ORDER — ONDANSETRON 8 MG PO TBDP: ORAL_TABLET | ORAL | 0 refills | Status: DC

## 2017-08-21 MED ORDER — DICYCLOMINE HCL 20 MG PO TABS: 20.0000 mg | ORAL_TABLET | Freq: Two times a day (BID) | ORAL | 0 refills | Status: DC

## 2017-08-21 MED ORDER — PROBIOTIC COMPLEX ACIDOPHILUS PO CAPS: 1.0000 | ORAL_CAPSULE | Freq: Three times a day (TID) | ORAL | 0 refills | Status: DC

## 2017-08-21 MED ORDER — IOPAMIDOL (ISOVUE-300) INJECTION 61%
80.0000 mL | Freq: Once | INTRAVENOUS | Status: AC | PRN
  Administered 2017-08-21: 80 mL via INTRAVENOUS

## 2017-08-21 NOTE — ED Notes (Signed)
Pt given diet ginger ale and crackers per verbal order by EDP

## 2017-08-21 NOTE — Telephone Encounter (Signed)
Called patient. No answer. Left a message for her. Will call her again on Monday. Paula's message is  "I just left my nurse Beth a message to call you . I don't know if we have samples but if so lets put you on 6 mg ( 2 tablets daily ) of Entocort. Do you want Lomotil, it is stronger than imodium? "

## 2017-08-21 NOTE — Telephone Encounter (Signed)
Kimberly Lin, I just sent you message about her diarrhea

## 2017-08-21 NOTE — ED Provider Notes (Signed)
[facility] EMERGENCY DEPARTMENT Provider Note   CSN: [id number] Arrival date & time: 08/20/17  2200     History   Chief Complaint Chief Complaint  Patient presents with  . Abdominal Pain  . Emesis  . Diarrhea    HPI Kimberly Lin is a 72 y.o. female.  The history is provided by the patient.  Emesis   This is a new problem. The current episode started more than 2 days ago. The problem occurs 5 to 10 times per day. The problem has not changed since onset.The emesis has an appearance of stomach contents. There has been no fever. Associated symptoms include diarrhea. Pertinent negatives include no chills, no fever and no sweats.  Diarrhea   This is a recurrent problem. The current episode started more than 1 week ago. The problem occurs 5 to 10 times per day. The problem has not changed since onset.The stool consistency is described as watery. There has been no fever. Associated symptoms include vomiting. Pertinent negatives include no chills and no sweats. She has tried anti-motility drugs for the symptoms. Her past medical history does not include bowel resection.    Past Medical History:  Diagnosis Date  . Abdominal mass   . Abnormal nuclear stress test 07/05/2014  . Acute encephalopathy 03/26/2016  . Anxiety   . Arthritis    osteoarthritis  . Colitis 2014  . Depression   . Diabetes mellitus   . Fibromyalgia   . GERD (gastroesophageal reflux disease)   . Hiatal hernia   . History of chicken pox   . History of septic shock 12/2009  . Hyperlipidemia   . Hypertension   . Migraine   . OSA (obstructive sleep apnea) 03/12/2011  . Personal history of colonic polyps   . Sepsis secondary to UTI ([place]) 03/26/2016  . Spinal stenosis     Patient Active Problem List   Diagnosis Date Noted  . Anterior shoulder dislocation, right, initial encounter 10/29/2016  . Falls 03/26/2016  . Anxiety and depression 03/26/2016  . Altered mental status 03/25/2016  . Abnormal  nuclear stress test 07/05/2014  . Fatty liver 03/26/2014  . Intertrigo 03/24/2014  . Allergic rhinitis 01/02/2014  . Incontinence, feces 11/10/2013  . Musculoskeletal pain 11/08/2013  . Dysuria 04/16/2013  . Benign paroxysmal positional vertigo 07/19/2012  . Obese 09/17/2011  . Knee pain 09/17/2011  . Weight gain 06/17/2011  . DOE (dyspnea on exertion) 04/15/2011  . OSA (obstructive sleep apnea) 03/12/2011  . Insomnia 01/15/2011  . Diabetes type 2, uncontrolled ([place]) 11/27/2010  . PVC (premature ventricular contraction) 11/27/2010  . Fibromyalgia   . Depression   . GERD (gastroesophageal reflux disease)   . Hyperlipidemia   . Hypertension   . Personal history of colonic polyps   . Migraine     Past Surgical History:  Procedure Laterality Date  . APPENDECTOMY  1983  . BREAST BIOPSY Right 2007, 2009  . CARDIAC CATHETERIZATION N/A 07/05/2014   Procedure: Left Heart Cath and Coronary Angiography;  Surgeon: Sherren Mocha, MD;  Location: [facility];  Service: Cardiovascular;  Laterality: N/A;  . COLONOSCOPY  2014   normal   . ESOPHAGOGASTRODUODENOSCOPY     in [place]  . MEDIAL PARTIAL KNEE REPLACEMENT Bilateral 2006  . OVARIAN CYST REMOVAL  2008   Pt states she had a ?cyst removed ?ovaries  . POLYPECTOMY  1998     OB History   None      Home Medications  Prior to Admission medications   Medication Sig Start Date End Date Taking? Authorizing Provider  amLODipine (NORVASC) 10 MG tablet Take 1 tablet (10 mg total) by mouth daily. 04/24/17   Debbrah Alar, NP  aspirin 81 MG tablet Take 81 mg by mouth daily.      [provider]  atorvastatin (LIPITOR) 40 MG tablet Take 1 tablet (40 mg total) by mouth daily. 04/24/17   Debbrah Alar, NP  Calcium Carbonate-Vitamin D (CALCIUM-D) 600-400 MG-UNIT TABS Take 1 tablet by mouth daily.     [provider]  Coenzyme Q10 (CO Q 10) 100 MG CAPS Take 100 mg by mouth daily.    [provider]  dicyclomine (BENTYL) 20 MG tablet Take 1 tablet (20 mg total) by mouth 2 (two) times daily. 08/21/17   Fermina Mishkin, MD  diphenhydrAMINE (BENADRYL) 25 MG tablet Take 25 mg by mouth every 6 (six) hours as needed for itching.     [provider]  escitalopram (LEXAPRO) 20 MG tablet Take 1 tablet (20 mg total) by mouth daily. 09/19/16   Debbrah Alar, NP  liraglutide (VICTOZA) 18 MG/3ML SOPN Inject 1.8mg  once daily 03/17/17   Debbrah Alar, NP  Magnesium Oxide 400 (240 Mg) MG TABS Take 1 tablet by mouth daily. 11/08/15   Debbrah Alar, NP  meclizine (ANTIVERT) 25 MG tablet Take 1 tablet (25 mg total) by mouth 3 (three) times daily as needed for dizziness. 01/01/16   Debbrah Alar, NP  metFORMIN (GLUCOPHAGE) 1000 MG tablet Take 1 tablet (1,000 mg total) by mouth 2 (two) times daily with a meal. 02/23/17   Debbrah Alar, NP  metoprolol succinate (TOPROL-XL) 50 MG 24 hr tablet Take 1 tablet (50 mg total) by mouth daily. Take with or immediately following a meal. 04/24/17   Debbrah Alar, NP  Multiple Vitamin (MULTIVITAMIN) tablet Take 1 tablet by mouth daily.      [provider]  Omega-3 Fatty Acids (FISH OIL) 1000 MG CAPS 2 caps by mouth twice daily Patient taking differently: Take 2,000 mg by mouth 2 (two) times daily.  04/16/11   Debbrah Alar, NP  ondansetron (ZOFRAN ODT) 8 MG disintegrating tablet 8mg  ODT q12 hours prn nausea 08/21/17   Finneus Kaneshiro, MD  oxyCODONE-acetaminophen (PERCOCET) 10-325 MG tablet Take 1 tablet by mouth every 8 (eight) hours as needed for pain (Back pain).    [provider]  pantoprazole (PROTONIX) 40 MG tablet Take 1 tablet (40 mg total) by mouth at bedtime. 02/23/17   Debbrah Alar, NP  Probiotic Product (PROBIOTIC COMPLEX ACIDOPHILUS) CAPS Take 1 capsule by mouth 4 (four) times daily - after meals and at bedtime. 08/21/17   Maddisen Vought, MD  venlafaxine XR (EFFEXOR-XR) 75 MG 24 hr capsule TAKE TWO  CAPSULES BY MOUTH ONCE DAILY 04/24/17   Debbrah Alar, NP    Family History Family History  Problem Relation Age of Onset  . Arthritis Mother   . Hyperlipidemia Mother   . Hypertension Mother   . Diabetes Mother   . Heart disease Mother   . Breast cancer Mother   . Lung cancer Brother   . Heart disease Brother        x 2  . Hypertension Brother   . Hyperlipidemia Brother   . Diabetes Brother   . Sudden death Father   . Lung cancer Father   . Rectal cancer Father   . Diabetes Sister   . Hyperlipidemia Sister   . Hypertension Sister   .  Diabetes Maternal Aunt   . Heart attack Maternal Aunt   . Hyperlipidemia Maternal Aunt   . Hypertension Maternal Aunt   . Hypertension Maternal Uncle   . Hyperlipidemia Maternal Uncle   . Heart attack Maternal Uncle   . Diabetes Maternal Uncle   . Diabetes Paternal Aunt   . Heart attack Paternal Aunt   . Hyperlipidemia Paternal Aunt   . Hypertension Paternal Aunt   . Hypertension Paternal Uncle   . Hyperlipidemia Paternal Uncle   . Heart attack Paternal Uncle   . Diabetes Paternal Uncle   . Prostate cancer Brother   . Stomach cancer Neg Hx   . Esophageal cancer Neg Hx     Social History Social History   Tobacco Use  . Smoking status: Former Smoker    Types: Cigarettes  . Smokeless tobacco: Never Used  . Tobacco comment: Smoked socially.  Substance Use Topics  . Alcohol use: Yes    Alcohol/week: 3.6 - 4.8 oz    Types: 6 - 8 Glasses of wine per week  . Drug use: No     Allergies   Erythromycin; Losartan; and Prednisone   Review of Systems Review of Systems  Constitutional: Negative for chills and fever.  Gastrointestinal: Positive for diarrhea and vomiting.  All other systems reviewed and are negative.    Physical Exam Updated Vital Signs BP (!) 149/93 (BP Location: Left Arm)   Pulse 99   Temp 97.9 F (36.6 C) (Oral)   Resp 18   Ht 5\' 4"  (1.626 m)   Wt 78.4 kg (172 lb 13.5 oz)   SpO2 99%   BMI 29.67  kg/m   Physical Exam  Constitutional: She is oriented to person, place, and time. She appears well-developed and well-nourished. No distress.  HENT:  Head: Normocephalic and atraumatic.  Mouth/Throat: No oropharyngeal exudate.  Eyes: Pupils are equal, round, and reactive to light. Conjunctivae are normal.  Neck: Normal range of motion. Neck supple.  Cardiovascular: Normal rate, regular rhythm, normal heart sounds and intact distal pulses.  Pulmonary/Chest: Effort normal and breath sounds normal. No stridor.  Abdominal: Soft. Bowel sounds are normal. She exhibits no mass. There is no tenderness. There is no rebound and no guarding.  Musculoskeletal: Normal range of motion.  Neurological: She is alert and oriented to person, place, and time. She displays normal reflexes.  Skin: Skin is warm and dry. Capillary refill takes less than 2 seconds.  Psychiatric: She has a normal mood and affect.     ED Treatments / Results  Labs (all labs ordered are listed, but only abnormal results are displayed) Results for orders placed or performed during the hospital encounter of 08/20/17  Comprehensive metabolic panel  Result Value Ref Range   Sodium 140 135 - 145 mmol/L   Potassium 3.6 3.5 - 5.1 mmol/L   Chloride 97 (L) 98 - 111 mmol/L   CO2 25 22 - 32 mmol/L   Glucose, Bld 163 (H) 70 - 99 mg/dL   BUN 19 8 - 23 mg/dL   Creatinine, Ser 1.20 (H) 0.44 - 1.00 mg/dL   Calcium 10.0 8.9 - 10.3 mg/dL   Total Protein 9.1 (H) 6.5 - 8.1 g/dL   Albumin 5.0 3.5 - 5.0 g/dL   AST 109 (H) 15 - 41 U/L   ALT 87 (H) 0 - 44 U/L   Alkaline Phosphatase 66 38 - 126 U/L   Total Bilirubin 1.3 (H) 0.3 - 1.2 mg/dL   GFR calc non Af  Amer 44 (L) >60 mL/min   GFR calc Af Amer 51 (L) >60 mL/min   Anion gap 18 (H) 5 - 15  Urinalysis, Routine w reflex microscopic  Result Value Ref Range   Color, Urine YELLOW YELLOW   APPearance CLEAR CLEAR   Specific Gravity, Urine 1.010 1.005 - 1.030   pH 7.5 5.0 - 8.0   Glucose, UA  NEGATIVE NEGATIVE mg/dL   Hgb urine dipstick NEGATIVE NEGATIVE   Bilirubin Urine NEGATIVE NEGATIVE   Ketones, ur 40 (A) NEGATIVE mg/dL   Protein, ur NEGATIVE NEGATIVE mg/dL   Nitrite NEGATIVE NEGATIVE   Leukocytes, UA NEGATIVE NEGATIVE  CBC with Differential/Platelet  Result Value Ref Range   WBC 13.6 (H) 4.0 - 10.5 K/uL   RBC 5.31 (H) 3.87 - 5.11 MIL/uL   Hemoglobin 16.5 (H) 12.0 - 15.0 g/dL   HCT 46.8 (H) 36.0 - 46.0 %   MCV 88.1 78.0 - 100.0 fL   MCH 31.1 26.0 - 34.0 pg   MCHC 35.3 30.0 - 36.0 g/dL   RDW 13.7 11.5 - 15.5 %   Platelets 343 150 - 400 K/uL   Neutrophils Relative % 68 %   Neutro Abs 9.3 (H) 1.7 - 7.7 K/uL   Lymphocytes Relative 18 %   Lymphs Abs 2.5 0.7 - 4.0 K/uL   Monocytes Relative 10 %   Monocytes Absolute 1.4 (H) 0.1 - 1.0 K/uL   Eosinophils Relative 4 %   Eosinophils Absolute 0.5 0.0 - 0.7 K/uL   Basophils Relative 0 %   Basophils Absolute 0.0 0.0 - 0.1 K/uL   Ct Abdomen Pelvis W Contrast  Result Date: 08/21/2017 CLINICAL DATA:  72 year old female with diarrhea, and nausea vomiting. History of colitis. EXAM: CT ABDOMEN AND PELVIS WITH CONTRAST TECHNIQUE: Multidetector CT imaging of the abdomen and pelvis was performed using the standard protocol following bolus administration of intravenous contrast. CONTRAST:  54mL ISOVUE-300 IOPAMIDOL (ISOVUE-300) INJECTION 61% COMPARISON:  CT of the abdomen pelvis dated 06/10/2016 FINDINGS: Lower chest: The visualized lung bases are clear. There is coronary vascular calcification. No intra-abdominal free air or free fluid. Hepatobiliary: The liver is unremarkable. No intrahepatic biliary ductal dilatation. The gallbladder is mildly distended. No calcified gallstone or pericholecystic fluid. Pancreas: Unremarkable. No pancreatic ductal dilatation or surrounding inflammatory changes. Spleen: Normal in size without focal abnormality. Adrenals/Urinary Tract: The adrenal glands are unremarkable. 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 There is no hydronephrosis on either side. Multiple subcentimeter hypodense lesions are too small to characterize. The visualized ureters and urinary bladder appear unremarkable. Stomach/Bowel: The stomach is distended with liquid content. There is no bowel obstruction or active inflammation. Loose stool noted throughout the colon compatible with diarrheal state. There is sigmoid diverticulosis without active inflammatory changes. Appendectomy. Vascular/Lymphatic: Moderate aortoiliac atherosclerotic disease. The IVC is unremarkable. No portal venous gas. There is no adenopathy. Reproductive: The uterus and ovaries are grossly unremarkable as visualized. Other: None Musculoskeletal: Degenerative changes of the spine. No acute osseous pathology. IMPRESSION: 1. Diarrheal state. Correlation with clinical exam and stool cultures recommended. No bowel obstruction or active inflammation. 2. Sigmoid diverticulosis. 3.  Aortic Atherosclerosis (ICD10-I70.0). Electronically Signed   By: Anner Crete M.D.   On: 08/21/2017 01:20    Radiology Ct Abdomen Pelvis W Contrast  Result Date: 08/21/2017 CLINICAL DATA:  72 year old female with diarrhea, and nausea vomiting. History of colitis. EXAM: CT ABDOMEN AND PELVIS WITH CONTRAST TECHNIQUE: Multidetector CT imaging of the abdomen and pelvis was performed using the  standard protocol following bolus administration of intravenous contrast. CONTRAST:  60mL ISOVUE-300 IOPAMIDOL (ISOVUE-300) INJECTION 61% COMPARISON:  CT of the abdomen pelvis dated 06/10/2016 FINDINGS: Lower chest: The visualized lung bases are clear. There is coronary vascular calcification. No intra-abdominal free air or free fluid. Hepatobiliary: The liver is unremarkable. No intrahepatic biliary ductal dilatation. The gallbladder is mildly distended. No calcified gallstone or pericholecystic fluid. Pancreas: Unremarkable. No pancreatic ductal dilatation or surrounding inflammatory changes.  Spleen: Normal in size without focal abnormality. Adrenals/Urinary Tract: The adrenal glands are unremarkable. 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 There is no hydronephrosis on either side. Multiple subcentimeter hypodense lesions are too small to characterize. The visualized ureters and urinary bladder appear unremarkable. Stomach/Bowel: The stomach is distended with liquid content. There is no bowel obstruction or active inflammation. Loose stool noted throughout the colon compatible with diarrheal state. There is sigmoid diverticulosis without active inflammatory changes. Appendectomy. Vascular/Lymphatic: Moderate aortoiliac atherosclerotic disease. The IVC is unremarkable. No portal venous gas. There is no adenopathy. Reproductive: The uterus and ovaries are grossly unremarkable as visualized. Other: None Musculoskeletal: Degenerative changes of the spine. No acute osseous pathology. IMPRESSION: 1. Diarrheal state. Correlation with clinical exam and stool cultures recommended. No bowel obstruction or active inflammation. 2. Sigmoid diverticulosis. 3.  Aortic Atherosclerosis (ICD10-I70.0). Electronically Signed   By: Anner Crete M.D.   On: 08/21/2017 01:20    Procedures Procedures (including critical care time)  Medications Ordered in ED Medications  sodium chloride 0.9 % bolus 1,000 mL (0 mLs Intravenous Stopped 08/21/17 0100)  ondansetron (ZOFRAN) injection 4 mg (4 mg Intravenous Given 08/20/17 2355)  iopamidol (ISOVUE-300) 61 % injection 80 mL (80 mLs Intravenous Contrast Given 08/21/17 0037)  dicyclomine (BENTYL) injection 20 mg (20 mg Intramuscular Given 08/21/17 0155)  ketorolac (TORADOL) 30 MG/ML injection 15 mg (15 mg Intravenous Given 08/21/17 0155)  sodium chloride 0.9 % bolus 500 mL (0 mLs Intravenous Stopped 08/21/17 0210)       Final Clinical Impressions(s) / ED Diagnoses   Final diagnoses:  Diarrhea, unspecified type  No signs of colitis, PO challenged  successfully in the department. Have advised bland diet.  Probiotics and close follow up with GI.    Return for pain, numbness, changes in vision or speech, fevers >100.4 unrelieved by medication, shortness of breath, intractable vomiting, or diarrhea, abdominal pain, Inability to tolerate liquids or food, cough, altered mental status or any concerns. No signs of systemic illness or infection. The patient is nontoxic-appearing on exam and vital signs are within normal limits. Will refer to urology for microscopy hematuria as patient is asymptomatic.  I have reviewed the triage vital signs and the nursing notes. Pertinent labs &imaging results that were available during my care of the patient were reviewed by me and considered in my medical decision making (see chart for details).  After history, exam, and medical workup I feel the patient has been appropriately medically screened and is safe for discharge home. Pertinent diagnoses were discussed with the patient. Patient was given return precautions.  ED Discharge Orders        Ordered    ondansetron (ZOFRAN ODT) 8 MG disintegrating tablet     08/21/17 0407    Probiotic Product (PROBIOTIC COMPLEX ACIDOPHILUS) CAPS  3 times daily after meals & bedtime     08/21/17 0407    dicyclomine (BENTYL) 20 MG tablet  2 times daily     08/21/17 0407       Shaunna Rosetti, MD  08/21/17 1048  

## 2017-08-24 DIAGNOSIS — R1110 Vomiting, unspecified: Secondary | ICD-10-CM | POA: Diagnosis not present

## 2017-08-24 DIAGNOSIS — R110 Nausea: Secondary | ICD-10-CM | POA: Diagnosis not present

## 2017-08-24 DIAGNOSIS — R109 Unspecified abdominal pain: Secondary | ICD-10-CM | POA: Diagnosis not present

## 2017-08-24 DIAGNOSIS — R197 Diarrhea, unspecified: Secondary | ICD-10-CM | POA: Diagnosis not present

## 2017-08-25 DIAGNOSIS — R197 Diarrhea, unspecified: Secondary | ICD-10-CM | POA: Diagnosis not present

## 2017-08-25 NOTE — Telephone Encounter (Addendum)
Spoke with the patient. She has gone to her PCP and states she is under treatment with him.  She states she has salmonella infection.

## 2017-08-31 DIAGNOSIS — E119 Type 2 diabetes mellitus without complications: Secondary | ICD-10-CM | POA: Diagnosis not present

## 2017-08-31 DIAGNOSIS — Z86010 Personal history of colonic polyps: Secondary | ICD-10-CM | POA: Diagnosis not present

## 2017-08-31 DIAGNOSIS — Z1211 Encounter for screening for malignant neoplasm of colon: Secondary | ICD-10-CM | POA: Diagnosis not present

## 2017-08-31 DIAGNOSIS — R197 Diarrhea, unspecified: Secondary | ICD-10-CM | POA: Diagnosis not present

## 2017-08-31 DIAGNOSIS — K635 Polyp of colon: Secondary | ICD-10-CM | POA: Diagnosis not present

## 2017-08-31 DIAGNOSIS — Z01818 Encounter for other preprocedural examination: Secondary | ICD-10-CM | POA: Diagnosis not present

## 2017-09-05 DIAGNOSIS — E860 Dehydration: Secondary | ICD-10-CM | POA: Diagnosis not present

## 2017-09-05 DIAGNOSIS — Z79899 Other long term (current) drug therapy: Secondary | ICD-10-CM | POA: Diagnosis not present

## 2017-09-05 DIAGNOSIS — E119 Type 2 diabetes mellitus without complications: Secondary | ICD-10-CM | POA: Diagnosis not present

## 2017-09-05 DIAGNOSIS — R197 Diarrhea, unspecified: Secondary | ICD-10-CM | POA: Diagnosis not present

## 2017-09-07 DIAGNOSIS — E119 Type 2 diabetes mellitus without complications: Secondary | ICD-10-CM | POA: Diagnosis not present

## 2017-09-07 DIAGNOSIS — A049 Bacterial intestinal infection, unspecified: Secondary | ICD-10-CM | POA: Diagnosis not present

## 2017-09-07 DIAGNOSIS — I10 Essential (primary) hypertension: Secondary | ICD-10-CM | POA: Diagnosis not present

## 2017-09-08 DIAGNOSIS — K529 Noninfective gastroenteritis and colitis, unspecified: Secondary | ICD-10-CM | POA: Diagnosis not present

## 2017-09-09 DIAGNOSIS — K529 Noninfective gastroenteritis and colitis, unspecified: Secondary | ICD-10-CM | POA: Diagnosis not present

## 2017-09-10 DIAGNOSIS — K635 Polyp of colon: Secondary | ICD-10-CM | POA: Diagnosis not present

## 2017-09-18 DIAGNOSIS — R110 Nausea: Secondary | ICD-10-CM | POA: Diagnosis not present

## 2017-09-18 DIAGNOSIS — R945 Abnormal results of liver function studies: Secondary | ICD-10-CM | POA: Diagnosis not present

## 2017-09-18 DIAGNOSIS — R197 Diarrhea, unspecified: Secondary | ICD-10-CM | POA: Diagnosis not present

## 2017-09-23 DIAGNOSIS — R945 Abnormal results of liver function studies: Secondary | ICD-10-CM | POA: Diagnosis not present

## 2017-09-23 DIAGNOSIS — K52832 Lymphocytic colitis: Secondary | ICD-10-CM | POA: Diagnosis not present

## 2017-09-23 DIAGNOSIS — E119 Type 2 diabetes mellitus without complications: Secondary | ICD-10-CM | POA: Diagnosis not present

## 2017-09-23 DIAGNOSIS — R197 Diarrhea, unspecified: Secondary | ICD-10-CM | POA: Diagnosis not present

## 2017-09-23 DIAGNOSIS — Z800 Family history of malignant neoplasm of digestive organs: Secondary | ICD-10-CM | POA: Diagnosis not present

## 2017-09-23 DIAGNOSIS — K635 Polyp of colon: Secondary | ICD-10-CM | POA: Diagnosis not present

## 2017-09-25 DIAGNOSIS — R945 Abnormal results of liver function studies: Secondary | ICD-10-CM | POA: Diagnosis not present

## 2017-10-05 DIAGNOSIS — E119 Type 2 diabetes mellitus without complications: Secondary | ICD-10-CM | POA: Diagnosis not present

## 2017-10-05 DIAGNOSIS — R1110 Vomiting, unspecified: Secondary | ICD-10-CM | POA: Diagnosis not present

## 2017-10-05 DIAGNOSIS — I10 Essential (primary) hypertension: Secondary | ICD-10-CM | POA: Diagnosis not present

## 2017-10-05 DIAGNOSIS — R197 Diarrhea, unspecified: Secondary | ICD-10-CM | POA: Diagnosis not present

## 2017-10-07 DIAGNOSIS — K52832 Lymphocytic colitis: Secondary | ICD-10-CM | POA: Diagnosis not present

## 2017-10-07 DIAGNOSIS — R197 Diarrhea, unspecified: Secondary | ICD-10-CM | POA: Diagnosis not present

## 2017-10-14 DIAGNOSIS — H401131 Primary open-angle glaucoma, bilateral, mild stage: Secondary | ICD-10-CM | POA: Diagnosis not present

## 2017-10-14 DIAGNOSIS — H04123 Dry eye syndrome of bilateral lacrimal glands: Secondary | ICD-10-CM | POA: Diagnosis not present

## 2017-10-14 DIAGNOSIS — E119 Type 2 diabetes mellitus without complications: Secondary | ICD-10-CM | POA: Diagnosis not present

## 2017-10-14 DIAGNOSIS — H43813 Vitreous degeneration, bilateral: Secondary | ICD-10-CM | POA: Diagnosis not present

## 2017-10-29 DIAGNOSIS — M6281 Muscle weakness (generalized): Secondary | ICD-10-CM | POA: Diagnosis not present

## 2017-10-29 DIAGNOSIS — Y998 Other external cause status: Secondary | ICD-10-CM | POA: Diagnosis not present

## 2017-10-29 DIAGNOSIS — S43014A Anterior dislocation of right humerus, initial encounter: Secondary | ICD-10-CM | POA: Diagnosis not present

## 2017-10-29 DIAGNOSIS — M25551 Pain in right hip: Secondary | ICD-10-CM | POA: Diagnosis not present

## 2017-10-29 DIAGNOSIS — M25511 Pain in right shoulder: Secondary | ICD-10-CM | POA: Diagnosis not present

## 2017-10-29 DIAGNOSIS — R55 Syncope and collapse: Secondary | ICD-10-CM | POA: Diagnosis not present

## 2017-10-29 DIAGNOSIS — S43004A Unspecified dislocation of right shoulder joint, initial encounter: Secondary | ICD-10-CM | POA: Diagnosis not present

## 2017-10-29 DIAGNOSIS — W1839XA Other fall on same level, initial encounter: Secondary | ICD-10-CM | POA: Diagnosis not present

## 2017-11-10 ENCOUNTER — Telehealth: Payer: Self-pay

## 2017-11-10 NOTE — Telephone Encounter (Signed)
Routed to [employer], front end supervisor, to advise on routing notes for dismissed patients.  Copied from [place] [phone]. Topic: Referral - Request >> Nov 10, 2017 11:49 AM Scherrie Gerlach wrote: Reason for CRM: Ryan with [facility] billing calling to request you send the sleep study and the 2nd face to face to them for the pt for billing purposes. Fax  [phone]  Phone  [phone]  ext [phone]

## 2017-11-11 ENCOUNTER — Telehealth: Payer: Self-pay | Admitting: *Deleted

## 2017-11-11 NOTE — Telephone Encounter (Signed)
Received Physician Orders from [facility] for Pap items that order states "certify that the listed items were ordered on 04/22/17. Dismissal letter was dated 04/15/17; forwarded to provider/SLS 10/02

## 2017-11-13 DIAGNOSIS — Z23 Encounter for immunization: Secondary | ICD-10-CM | POA: Diagnosis not present

## 2017-11-26 DIAGNOSIS — K635 Polyp of colon: Secondary | ICD-10-CM | POA: Diagnosis not present

## 2017-11-26 DIAGNOSIS — R197 Diarrhea, unspecified: Secondary | ICD-10-CM | POA: Diagnosis not present

## 2017-11-26 DIAGNOSIS — K219 Gastro-esophageal reflux disease without esophagitis: Secondary | ICD-10-CM | POA: Diagnosis not present

## 2017-11-26 DIAGNOSIS — K52832 Lymphocytic colitis: Secondary | ICD-10-CM | POA: Diagnosis not present

## 2017-11-27 NOTE — Telephone Encounter (Signed)
Author phoned pt. To ask for roi, but no answer. Author left VM asking for return call.

## 2017-11-27 NOTE — Telephone Encounter (Signed)
Patient will have to sign an ROI giving [place] permission to release her information. We can route notes for any patient, we just must have an ROI form signed & that is sent to HIM dept to release.

## 2017-12-08 DIAGNOSIS — M5417 Radiculopathy, lumbosacral region: Secondary | ICD-10-CM | POA: Diagnosis not present

## 2018-01-06 DIAGNOSIS — M4727 Other spondylosis with radiculopathy, lumbosacral region: Secondary | ICD-10-CM | POA: Diagnosis not present

## 2018-01-06 DIAGNOSIS — M545 Low back pain: Secondary | ICD-10-CM | POA: Diagnosis not present

## 2018-01-06 DIAGNOSIS — M62838 Other muscle spasm: Secondary | ICD-10-CM | POA: Diagnosis not present

## 2018-01-06 DIAGNOSIS — Z6828 Body mass index (BMI) 28.0-28.9, adult: Secondary | ICD-10-CM | POA: Diagnosis not present

## 2018-01-06 DIAGNOSIS — I10 Essential (primary) hypertension: Secondary | ICD-10-CM | POA: Diagnosis not present

## 2018-07-20 ENCOUNTER — Other Ambulatory Visit (HOSPITAL_BASED_OUTPATIENT_CLINIC_OR_DEPARTMENT_OTHER): Payer: Self-pay | Admitting: Family Medicine

## 2018-07-20 DIAGNOSIS — Z1231 Encounter for screening mammogram for malignant neoplasm of breast: Secondary | ICD-10-CM

## 2018-07-23 ENCOUNTER — Other Ambulatory Visit: Payer: Self-pay

## 2018-07-23 ENCOUNTER — Ambulatory Visit (HOSPITAL_BASED_OUTPATIENT_CLINIC_OR_DEPARTMENT_OTHER)
Admission: RE | Admit: 2018-07-23 | Discharge: 2018-07-23 | Disposition: A | Discharge status: Home / Self Care | Payer: Medicare Other | Source: Ambulatory Visit | Attending: Family Medicine | Admitting: Family Medicine

## 2018-07-23 DIAGNOSIS — Z1231 Encounter for screening mammogram for malignant neoplasm of breast: Secondary | ICD-10-CM | POA: Diagnosis present

## 2018-09-04 ENCOUNTER — Encounter (HOSPITAL_BASED_OUTPATIENT_CLINIC_OR_DEPARTMENT_OTHER): Payer: Self-pay | Admitting: Emergency Medicine

## 2018-09-04 ENCOUNTER — Emergency Department (HOSPITAL_BASED_OUTPATIENT_CLINIC_OR_DEPARTMENT_OTHER)
Admission: EM | Admit: 2018-09-04 | Discharge: 2018-09-04 | Disposition: A | Discharge status: Home / Self Care | Payer: Medicare Other | Attending: Emergency Medicine | Admitting: Emergency Medicine

## 2018-09-04 ENCOUNTER — Other Ambulatory Visit: Payer: Self-pay

## 2018-09-04 ENCOUNTER — Emergency Department (HOSPITAL_BASED_OUTPATIENT_CLINIC_OR_DEPARTMENT_OTHER): Payer: Medicare Other

## 2018-09-04 DIAGNOSIS — Z7982 Long term (current) use of aspirin: Secondary | ICD-10-CM | POA: Insufficient documentation

## 2018-09-04 DIAGNOSIS — Z79899 Other long term (current) drug therapy: Secondary | ICD-10-CM | POA: Diagnosis not present

## 2018-09-04 DIAGNOSIS — E119 Type 2 diabetes mellitus without complications: Secondary | ICD-10-CM | POA: Insufficient documentation

## 2018-09-04 DIAGNOSIS — Y998 Other external cause status: Secondary | ICD-10-CM | POA: Diagnosis not present

## 2018-09-04 DIAGNOSIS — Z87891 Personal history of nicotine dependence: Secondary | ICD-10-CM | POA: Insufficient documentation

## 2018-09-04 DIAGNOSIS — I10 Essential (primary) hypertension: Secondary | ICD-10-CM | POA: Insufficient documentation

## 2018-09-04 DIAGNOSIS — Y9389 Activity, other specified: Secondary | ICD-10-CM | POA: Insufficient documentation

## 2018-09-04 DIAGNOSIS — X509XXA Other and unspecified overexertion or strenuous movements or postures, initial encounter: Secondary | ICD-10-CM | POA: Diagnosis not present

## 2018-09-04 DIAGNOSIS — S43004A Unspecified dislocation of right shoulder joint, initial encounter: Secondary | ICD-10-CM

## 2018-09-04 DIAGNOSIS — Y929 Unspecified place or not applicable: Secondary | ICD-10-CM | POA: Diagnosis not present

## 2018-09-04 DIAGNOSIS — S4991XA Unspecified injury of right shoulder and upper arm, initial encounter: Secondary | ICD-10-CM | POA: Diagnosis present

## 2018-09-04 DIAGNOSIS — Z7984 Long term (current) use of oral hypoglycemic drugs: Secondary | ICD-10-CM | POA: Insufficient documentation

## 2018-09-04 MED ORDER — PROPOFOL 10 MG/ML IV BOLUS
INTRAVENOUS | Status: AC | PRN
  Administered 2018-09-04 (×2): 39.9 mg via INTRAVENOUS

## 2018-09-04 MED ORDER — PROPOFOL 10 MG/ML IV BOLUS: 0.5000 mg/kg | INTRAVENOUS | Status: DC | PRN

## 2018-09-04 MED ORDER — PROPOFOL 10 MG/ML IV BOLUS
INTRAVENOUS | Status: AC
  Filled 2018-09-04: qty 20

## 2018-09-04 NOTE — Sedation Documentation (Addendum)
O2 increased to 6lpm and jaw thrust performed by RT

## 2018-09-04 NOTE — Sedation Documentation (Signed)
Pt moaning in pain. ?

## 2018-09-04 NOTE — ED Triage Notes (Signed)
Pt c/o dislocated R shoulder since last night when she reached behind her. She states this is the 3rd time it has happened.

## 2018-09-04 NOTE — Sedation Documentation (Signed)
X-ray at bedside

## 2018-09-04 NOTE — Discharge Instructions (Addendum)
Follow-up with your orthopedic doctor or sports medicine doctor Dr. Barbaraann Barthel

## 2018-09-04 NOTE — ED Provider Notes (Signed)
[facility] Provider Note   CSN: [id number] Arrival date & time: 09/04/18  9024    History   Chief Complaint Chief Complaint  Patient presents with  . Arm Pain    HPI Kimberly Lin is a 73 y.o. female.     HPI Pt states she was reaching her arm back last night and felt it pop out.  The pain persisted throughout the night.  She has had this happen before with shoulder dislocations.   No other injuries.   No numbness or weakness.  Last PO was a sip of coffee this am.  This morning her pain persisted and she is having difficulty moving her right shoulder so she came in for evaluation. Past Medical History:  Diagnosis Date  . Abdominal mass   . Abnormal nuclear stress test 07/05/2014  . Acute encephalopathy 03/26/2016  . Anxiety   . Arthritis    osteoarthritis  . Colitis 2014  . Depression   . Diabetes mellitus   . Fibromyalgia   . GERD (gastroesophageal reflux disease)   . Hiatal hernia   . History of chicken pox   . History of septic shock 12/2009  . Hyperlipidemia   . Hypertension   . Migraine   . OSA (obstructive sleep apnea) 03/12/2011  . Personal history of colonic polyps   . Sepsis secondary to UTI ([place]) 03/26/2016  . Spinal stenosis     Patient Active Problem List   Diagnosis Date Noted  . Anterior shoulder dislocation, right, initial encounter 10/29/2016  . Falls 03/26/2016  . Anxiety and depression 03/26/2016  . Altered mental status 03/25/2016  . Abnormal nuclear stress test 07/05/2014  . Fatty liver 03/26/2014  . Intertrigo 03/24/2014  . Allergic rhinitis 01/02/2014  . Incontinence, feces 11/10/2013  . Musculoskeletal pain 11/08/2013  . Dysuria 04/16/2013  . Benign paroxysmal positional vertigo 07/19/2012  . Obese 09/17/2011  . Knee pain 09/17/2011  . Weight gain 06/17/2011  . DOE (dyspnea on exertion) 04/15/2011  . OSA (obstructive sleep apnea) 03/12/2011  . Insomnia 01/15/2011  . Diabetes type 2, uncontrolled  ([place]) 11/27/2010  . PVC (premature ventricular contraction) 11/27/2010  . Fibromyalgia   . Depression   . GERD (gastroesophageal reflux disease)   . Hyperlipidemia   . Hypertension   . Personal history of colonic polyps   . Migraine     Past Surgical History:  Procedure Laterality Date  . APPENDECTOMY  1983  . BREAST BIOPSY Right 2007, 2009  . CARDIAC CATHETERIZATION N/A 07/05/2014   Procedure: Left Heart Cath and Coronary Angiography;  Surgeon: Sherren Mocha, MD;  Location: [facility];  Service: Cardiovascular;  Laterality: N/A;  . COLONOSCOPY  2014   normal   . ESOPHAGOGASTRODUODENOSCOPY     in [place]  . MEDIAL PARTIAL KNEE REPLACEMENT Bilateral 2006  . OVARIAN CYST REMOVAL  2008   Pt states she had a ?cyst removed ?ovaries  . POLYPECTOMY  1998     OB History   No obstetric history on file.      Home Medications    Prior to Admission medications   Medication Sig Start Date End Date Taking? Authorizing Provider  amLODipine (NORVASC) 10 MG tablet Take 1 tablet (10 mg total) by mouth daily. 04/24/17   Debbrah Alar, NP  aspirin 81 MG tablet Take 81 mg by mouth daily.      [provider]  atorvastatin (LIPITOR) 40 MG tablet Take 1 tablet (40 mg total) by mouth  daily. 04/24/17   Debbrah Alar, NP  Calcium Carbonate-Vitamin D (CALCIUM-D) 600-400 MG-UNIT TABS Take 1 tablet by mouth daily.     [provider]  Coenzyme Q10 (CO Q 10) 100 MG CAPS Take 100 mg by mouth daily.    [provider]  dicyclomine (BENTYL) 20 MG tablet Take 1 tablet (20 mg total) by mouth 2 (two) times daily. 08/21/17   Palumbo, April, MD  diphenhydrAMINE (BENADRYL) 25 MG tablet Take 25 mg by mouth every 6 (six) hours as needed for itching.     [provider]  escitalopram (LEXAPRO) 20 MG tablet Take 1 tablet (20 mg total) by mouth daily. 09/19/16   Debbrah Alar, NP  liraglutide (VICTOZA) 18 MG/3ML SOPN Inject 1.8mg  once daily 03/17/17    Debbrah Alar, NP  Magnesium Oxide 400 (240 Mg) MG TABS Take 1 tablet by mouth daily. 11/08/15   Debbrah Alar, NP  meclizine (ANTIVERT) 25 MG tablet Take 1 tablet (25 mg total) by mouth 3 (three) times daily as needed for dizziness. 01/01/16   Debbrah Alar, NP  metFORMIN (GLUCOPHAGE) 1000 MG tablet Take 1 tablet (1,000 mg total) by mouth 2 (two) times daily with a meal. 02/23/17   Debbrah Alar, NP  metoprolol succinate (TOPROL-XL) 50 MG 24 hr tablet Take 1 tablet (50 mg total) by mouth daily. Take with or immediately following a meal. 04/24/17   Debbrah Alar, NP  Multiple Vitamin (MULTIVITAMIN) tablet Take 1 tablet by mouth daily.      [provider]  Omega-3 Fatty Acids (FISH OIL) 1000 MG CAPS 2 caps by mouth twice daily Patient taking differently: Take 2,000 mg by mouth 2 (two) times daily.  04/16/11   Debbrah Alar, NP  ondansetron (ZOFRAN ODT) 8 MG disintegrating tablet 8mg  ODT q12 hours prn nausea 08/21/17   Palumbo, April, MD  oxyCODONE-acetaminophen (PERCOCET) 10-325 MG tablet Take 1 tablet by mouth every 8 (eight) hours as needed for pain (Back pain).    [provider]  pantoprazole (PROTONIX) 40 MG tablet Take 1 tablet (40 mg total) by mouth at bedtime. 02/23/17   Debbrah Alar, NP  Probiotic Product (PROBIOTIC COMPLEX ACIDOPHILUS) CAPS Take 1 capsule by mouth 4 (four) times daily - after meals and at bedtime. 08/21/17   Palumbo, April, MD  venlafaxine XR (EFFEXOR-XR) 75 MG 24 hr capsule TAKE TWO CAPSULES BY MOUTH ONCE DAILY 04/24/17   Debbrah Alar, NP    Family History Family History  Problem Relation Age of Onset  . Arthritis Mother   . Hyperlipidemia Mother   . Hypertension Mother   . Diabetes Mother   . Heart disease Mother   . Breast cancer Mother   . Lung cancer Brother   . Heart disease Brother        x 2  . Hypertension Brother   . Hyperlipidemia Brother   . Diabetes Brother   . Sudden death Father   .  Lung cancer Father   . Rectal cancer Father   . Diabetes Sister   . Hyperlipidemia Sister   . Hypertension Sister   . Diabetes Maternal Aunt   . Heart attack Maternal Aunt   . Hyperlipidemia Maternal Aunt   . Hypertension Maternal Aunt   . Hypertension Maternal Uncle   . Hyperlipidemia Maternal Uncle   . Heart attack Maternal Uncle   . Diabetes Maternal Uncle   . Diabetes Paternal Aunt   . Heart attack Paternal Aunt   . Hyperlipidemia Paternal Aunt   .  Hypertension Paternal Aunt   . Hypertension Paternal Uncle   . Hyperlipidemia Paternal Uncle   . Heart attack Paternal Uncle   . Diabetes Paternal Uncle   . Prostate cancer Brother   . Stomach cancer Neg Hx   . Esophageal cancer Neg Hx     Social History Social History   Tobacco Use  . Smoking status: Former Smoker    Types: Cigarettes  . Smokeless tobacco: Never Used  . Tobacco comment: Smoked socially.  Substance Use Topics  . Alcohol use: Yes    Alcohol/week: 6.0 - 8.0 standard drinks    Types: 6 - 8 Glasses of wine per week  . Drug use: No     Allergies   Erythromycin, Losartan, and Prednisone   Review of Systems Review of Systems  All other systems reviewed and are negative.    Physical Exam Updated Vital Signs BP 132/89 (BP Location: Left Arm)   Pulse 78   Temp 97.7 F (36.5 C) (Oral)   Resp 14   Wt 79.8 kg   SpO2 97%   BMI 30.21 kg/m   Physical Exam Vitals signs and nursing note reviewed.  Constitutional:      General: She is not in acute distress.    Appearance: She is well-developed.  HENT:     Head: Normocephalic and atraumatic.     Right Ear: External ear normal.     Left Ear: External ear normal.     Mouth/Throat:     Pharynx: No oropharyngeal exudate or posterior oropharyngeal erythema.  Eyes:     General: No scleral icterus.       Right eye: No discharge.        Left eye: No discharge.     Conjunctiva/sclera: Conjunctivae normal.  Neck:     Musculoskeletal: Normal range of  motion and neck supple.     Trachea: No tracheal deviation.  Cardiovascular:     Rate and Rhythm: Normal rate and regular rhythm.  Pulmonary:     Effort: Pulmonary effort is normal. No respiratory distress.     Breath sounds: Normal breath sounds. No stridor.  Abdominal:     General: There is no distension.  Musculoskeletal:        General: No swelling.     Right shoulder: She exhibits tenderness, bony tenderness and deformity.  Skin:    General: Skin is warm and dry.     Findings: No rash.  Neurological:     Mental Status: She is alert.     Cranial Nerves: Cranial nerve deficit: no gross deficits.      ED Treatments / Results  Labs (all labs ordered are listed, but only abnormal results are displayed) Labs Reviewed - No data to display  EKG None  Radiology Dg Shoulder Right  Result Date: 09/04/2018 CLINICAL DATA:  73 year old female with a history of right shoulder dislocation EXAM: RIGHT SHOULDER - 2+ VIEW COMPARISON:  None. FINDINGS: Anterior shoulder dislocation of the right shoulder with the humeral head projecting over the inferior glenoid. Degenerative changes of the acromioclavicular joint with subacromial spurring. No acute displaced fracture identified. IMPRESSION: Right anterior shoulder dislocation. Electronically Signed   By: Corrie Mckusick D.O.   On: 09/04/2018 10:15   Dg Shoulder Right Portable  Result Date: 09/04/2018 CLINICAL DATA:  Postreduction. EXAM: PORTABLE RIGHT SHOULDER COMPARISON:  Earlier the same date and 10/29/2017. FINDINGS: 1244 hours. The glenohumeral dislocation has been reduced. There are possible tiny avulsion fractures along the inferior glenoid rim.  The humeral head appears stable without definite Hill-Sachs deformity. IMPRESSION: Interval reduction of previously demonstrated glenohumeral dislocation. Possible tiny avulsion fracture fragments along inferior glenoid rim. Electronically Signed   By: Richardean Sale M.D.   On: 09/04/2018 13:23     Procedures .Sedation  Date/Time: 09/04/2018 11:05 AM Performed by: Dorie Rank, MD Authorized by: Dorie Rank, MD   Consent:    Consent obtained:  Verbal   Consent given by:  Patient   Risks discussed:  Allergic reaction, dysrhythmia, inadequate sedation, nausea, prolonged hypoxia resulting in organ damage, prolonged sedation necessitating reversal, respiratory compromise necessitating ventilatory assistance and intubation and vomiting   Alternatives discussed:  Analgesia without sedation, anxiolysis and regional anesthesia Universal protocol:    Procedure explained and questions answered to patient or proxy's satisfaction: yes     Relevant documents present and verified: yes     Test results available and properly labeled: yes     Imaging studies available: yes     Required blood products, implants, devices, and special equipment available: yes     Site/side marked: yes     Immediately prior to procedure a time out was called: yes     Patient identity confirmation method:  Verbally with patient Indications:    Procedure necessitating sedation performed by:  Physician performing sedation Pre-sedation assessment:    Time since last food or drink:  4   ASA classification: class 2 - patient with mild systemic disease     Neck mobility: normal     Mouth opening:  3 or more finger widths   Thyromental distance:  4 finger widths   Mallampati score:  I - soft palate, uvula, fauces, pillars visible   Pre-sedation assessments completed and reviewed: airway patency, cardiovascular function, hydration status, mental status, nausea/vomiting, pain level, respiratory function and temperature     Pre-sedation assessment completed:  09/04/2018 11:05 AM Immediate pre-procedure details:    Reassessment: Patient reassessed immediately prior to procedure     Reviewed: vital signs, relevant labs/tests and NPO status     Verified: bag valve mask available, emergency equipment available, intubation  equipment available, IV patency confirmed, oxygen available and suction available   Procedure details (see MAR for exact dosages):    Preoxygenation:  Nasal cannula   Sedation:  Propofol   Intra-procedure monitoring:  Blood pressure monitoring, cardiac monitor, continuous pulse oximetry, frequent LOC assessments, frequent vital sign checks and continuous capnometry   Intra-procedure events: none     Total Provider sedation time (minutes):  25 Post-procedure details:    Post-sedation assessment completed:  09/04/2018 2:00 PM   Attendance: Constant attendance by certified staff until patient recovered     Recovery: Patient returned to pre-procedure baseline     Post-sedation assessments completed and reviewed: airway patency, cardiovascular function, hydration status, mental status, nausea/vomiting, pain level, respiratory function and temperature     Patient is stable for discharge or admission: yes     Patient tolerance:  Tolerated well, no immediate complications .Ortho Injury Treatment  Date/Time: 09/04/2018 1:02 PM Performed by: Dorie Rank, MD Authorized by: Dorie Rank, MD   Consent:    Consent obtained:  Written   Consent given by:  PatientInjury location: shoulder Location details: right shoulder Injury type: dislocation Chronicity: new Pre-procedure neurovascular assessment: neurovascularly intact  Patient sedated: Yes. Refer to sedation procedure documentation for details of sedation. Manipulation performed: yes Reduction method: traction and counter traction Reduction successful: yes X-ray confirmed reduction: yes Immobilization: sling Post-procedure neurovascular assessment: post-procedure neurovascularly  intact Post-procedure distal perfusion: normal Post-procedure neurological function: normal Patient tolerance: patient tolerated the procedure well with no immediate complications    (including critical care time)  Medications Ordered in ED Medications  propofol  (DIPRIVAN) 10 mg/mL bolus/IV push (39.9 mg Intravenous Given 09/04/18 1249)     Initial Impression / Assessment and Plan / ED Course  I have reviewed the triage vital signs and the nursing notes.  Pertinent labs & imaging results that were available during my care of the patient were reviewed by me and considered in my medical decision making (see chart for details).   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  Successfully reduced with propofol sedation.  Pt placed in a sling.  Dc home with outpt follow up.  Instructed pt on limited use until seen by ortho or sports medicine and cleared for full activity.  Final Clinical Impressions(s) / ED Diagnoses   Final diagnoses:  Dislocation of right shoulder joint, initial encounter    ED Discharge Orders    None       Dorie Rank, MD 09/05/18 [phone]

## 2018-09-04 NOTE — Sedation Documentation (Signed)
Unable to assess pain due to sedation.  

## 2018-09-15 ENCOUNTER — Ambulatory Visit: Payer: Medicare Other | Admitting: Family Medicine

## 2018-09-16 ENCOUNTER — Ambulatory Visit: Payer: Medicare Other | Admitting: Family Medicine

## 2018-09-16 NOTE — Progress Notes (Deleted)
BRYANT LIPPS - 73 y.o. female MRN [id number]  Date of birth: [birth year]  SUBJECTIVE:  Including CC & ROS.  No chief complaint on file.   SHANECE COCHRANE is a 73 y.o. female that is  ***.  Independent review of the right shoulder x-ray from 7/25 shows possible tiny fractures at the inferior glenoid rim.   Review of Systems  HISTORY: Past Medical, Surgical, Social, and Family History Reviewed & Updated per EMR.   Pertinent Historical Findings include:  Past Medical History:  Diagnosis Date  . Abdominal mass   . Abnormal nuclear stress test 07/05/2014  . Acute encephalopathy 03/26/2016  . Anxiety   . Arthritis    osteoarthritis  . Colitis 2014  . Depression   . Diabetes mellitus   . Fibromyalgia   . GERD (gastroesophageal reflux disease)   . Hiatal hernia   . History of chicken pox   . History of septic shock 12/2009  . Hyperlipidemia   . Hypertension   . Migraine   . OSA (obstructive sleep apnea) 03/12/2011  . Personal history of colonic polyps   . Sepsis secondary to UTI ([place]) 03/26/2016  . Spinal stenosis     Past Surgical History:  Procedure Laterality Date  . APPENDECTOMY  1983  . BREAST BIOPSY Right 2007, 2009  . CARDIAC CATHETERIZATION N/A 07/05/2014   Procedure: Left Heart Cath and Coronary Angiography;  Surgeon: Sherren Mocha, MD;  Location: [facility];  Service: Cardiovascular;  Laterality: N/A;  . COLONOSCOPY  2014   normal   . ESOPHAGOGASTRODUODENOSCOPY     in [place]  . MEDIAL PARTIAL KNEE REPLACEMENT Bilateral 2006  . OVARIAN CYST REMOVAL  2008   Pt states she had a ?cyst removed ?ovaries  . POLYPECTOMY  1998    Allergies  Allergen Reactions  . Erythromycin Swelling    "all mycins"  . Losartan     hyperkalemia  . Prednisone Other (See Comments)    "Insomnia" and "makes me crazy"    Family History  Problem Relation Age of Onset  . Arthritis Mother   . Hyperlipidemia Mother   . Hypertension Mother   . Diabetes Mother   . Heart  disease Mother   . Breast cancer Mother   . Lung cancer Brother   . Heart disease Brother        x 2  . Hypertension Brother   . Hyperlipidemia Brother   . Diabetes Brother   . Sudden death Father   . Lung cancer Father   . Rectal cancer Father   . Diabetes Sister   . Hyperlipidemia Sister   . Hypertension Sister   . Diabetes Maternal Aunt   . Heart attack Maternal Aunt   . Hyperlipidemia Maternal Aunt   . Hypertension Maternal Aunt   . Hypertension Maternal Uncle   . Hyperlipidemia Maternal Uncle   . Heart attack Maternal Uncle   . Diabetes Maternal Uncle   . Diabetes Paternal Aunt   . Heart attack Paternal Aunt   . Hyperlipidemia Paternal Aunt   . Hypertension Paternal Aunt   . Hypertension Paternal Uncle   . Hyperlipidemia Paternal Uncle   . Heart attack Paternal Uncle   . Diabetes Paternal Uncle   . Prostate cancer Brother   . Stomach cancer Neg Hx   . Esophageal cancer Neg Hx      Social History   Socioeconomic History  . Marital status: Married    Spouse name: Not on file  .  Number of children: 2  . Years of education: Not on file  . Highest education level: Not on file  Occupational History  . Not on file  Social Needs  . Financial resource strain: Not on file  . Food insecurity    Worry: Not on file    Inability: Not on file  . Transportation needs    Medical: Not on file    Non-medical: Not on file  Tobacco Use  . Smoking status: Former Smoker    Types: Cigarettes  . Smokeless tobacco: Never Used  . Tobacco comment: Smoked socially.  Substance and Sexual Activity  . Alcohol use: Yes    Alcohol/week: 6.0 - 8.0 standard drinks    Types: 6 - 8 Glasses of wine per week  . Drug use: No  . Sexual activity: Not on file  Lifestyle  . Physical activity    Days per week: Not on file    Minutes per session: Not on file  . Stress: Not on file  Relationships  . Social Herbalist on phone: Not on file    Gets together: Not on file     Attends religious service: Not on file    Active member of club or organization: Not on file    Attends meetings of clubs or organizations: Not on file    Relationship status: Not on file  . Intimate partner violence    Fear of current or ex partner: Not on file    Emotionally abused: Not on file    Physically abused: Not on file    Forced sexual activity: Not on file  Other Topics Concern  . Not on file  Social History Narrative   Regular exercise:  No   Caffeine use: 1 cup daily           PHYSICAL EXAM:  VS: There were no vitals taken for this visit. Physical Exam Gen: NAD, alert, cooperative with exam, well-appearing ENT: normal lips, normal nasal mucosa,  Eye: normal EOM, normal conjunctiva and lids CV:  no edema, +2 pedal pulses   Resp: no accessory muscle use, non-labored,  GI: no masses or tenderness, no hernia  Skin: no rashes, no areas of induration  Neuro: normal tone, normal sensation to touch Psych:  normal insight, alert and oriented MSK:  ***      ASSESSMENT & PLAN:   No problem-specific Assessment & Plan notes found for this encounter.

## 2018-10-11 ENCOUNTER — Other Ambulatory Visit: Payer: Self-pay

## 2018-10-11 ENCOUNTER — Emergency Department (HOSPITAL_BASED_OUTPATIENT_CLINIC_OR_DEPARTMENT_OTHER): Payer: Medicare Other

## 2018-10-11 ENCOUNTER — Emergency Department (HOSPITAL_BASED_OUTPATIENT_CLINIC_OR_DEPARTMENT_OTHER)
Admission: EM | Admit: 2018-10-11 | Discharge: 2018-10-11 | Disposition: A | Discharge status: Home / Self Care | Payer: Medicare Other | Attending: Emergency Medicine | Admitting: Emergency Medicine

## 2018-10-11 ENCOUNTER — Encounter (HOSPITAL_BASED_OUTPATIENT_CLINIC_OR_DEPARTMENT_OTHER): Payer: Self-pay | Admitting: *Deleted

## 2018-10-11 DIAGNOSIS — W16212A Fall in (into) filled bathtub causing other injury, initial encounter: Secondary | ICD-10-CM | POA: Diagnosis not present

## 2018-10-11 DIAGNOSIS — Y92002 Bathroom of unspecified non-institutional (private) residence single-family (private) house as the place of occurrence of the external cause: Secondary | ICD-10-CM | POA: Diagnosis not present

## 2018-10-11 DIAGNOSIS — Y93E1 Activity, personal bathing and showering: Secondary | ICD-10-CM | POA: Diagnosis not present

## 2018-10-11 DIAGNOSIS — Y998 Other external cause status: Secondary | ICD-10-CM | POA: Diagnosis not present

## 2018-10-11 DIAGNOSIS — E119 Type 2 diabetes mellitus without complications: Secondary | ICD-10-CM | POA: Diagnosis not present

## 2018-10-11 DIAGNOSIS — I10 Essential (primary) hypertension: Secondary | ICD-10-CM | POA: Insufficient documentation

## 2018-10-11 DIAGNOSIS — S43004A Unspecified dislocation of right shoulder joint, initial encounter: Secondary | ICD-10-CM | POA: Insufficient documentation

## 2018-10-11 MED ORDER — PROPOFOL 10 MG/ML IV BOLUS
1.0000 mg/kg | Freq: Once | INTRAVENOUS | Status: AC
  Administered 2018-10-11: 77.1 mg via INTRAVENOUS
  Filled 2018-10-11: qty 20

## 2018-10-11 NOTE — Sedation Documentation (Addendum)
Xray here for portable shoulder film

## 2018-10-11 NOTE — Discharge Instructions (Signed)
Please read and follow all provided instructions.  Your diagnoses today include:  1. Dislocation of right shoulder joint, initial encounter     Tests performed today include:  X-ray of the shoulder  Vital signs. See below for your results today.   Medications prescribed:   None  Take any prescribed medications only as directed.  Home care instructions:   Follow any educational materials contained in this packet  Follow R.I.C.E. Protocol:  R - rest your injury   I  - use ice on injury without applying directly to skin  C - compress injury with bandage or splint  E - elevate the injury as much as possible  Follow-up instructions: Use your sling until you follow-up with the orthopedic physician for further evaluation of your recurrent shoulder dislocations.  Definitely do not reach or raise that arm above your head until you are given instructions on how to proceed.  Return instructions:   Please return if your fingers are numb or tingling, appear gray or blue, or you have severe pain (also elevate the arm and loosen splint or wrap if you were given one)  Please return to the Emergency Department if you experience worsening symptoms.   Please return if you have any other emergent concerns.  Additional Information:  Your vital signs today were: BP (!) 143/83    Pulse 72    Temp 98.1 F (36.7 C)    Resp 20    Ht 5\' 3"  (1.6 m)    Wt 77.1 kg    SpO2 99%    BMI 30.11 kg/m  If your blood pressure (BP) was elevated above 135/85 this visit, please have this repeated by your doctor within one month. --------------

## 2018-10-11 NOTE — ED Triage Notes (Signed)
Pt c/o fall from tub with right shoulder injury , hx of dislocation x 1 month ago

## 2018-10-11 NOTE — ED Provider Notes (Signed)
Pt with recurrent shoulder dislocation.  Reduced under sedation with propofol  No complications.  .Sedation  Date/Time: 10/11/2018 3:45 PM Performed by: Malvin Johns, MD Authorized by: Malvin Johns, MD   Consent:    Consent obtained:  Written   Consent given by:  Patient   Risks discussed:  Inadequate sedation, vomiting, respiratory compromise necessitating ventilatory assistance and intubation and allergic reaction   Alternatives discussed:  Analgesia without sedation Universal protocol:    Procedure explained and questions answered to patient or proxy's satisfaction: yes     Relevant documents present and verified: yes     Test results available and properly labeled: yes     Imaging studies available: yes     Required blood products, implants, devices, and special equipment available: yes     Site/side marked: no     Immediately prior to procedure a time out was called: yes   Indications:    Procedure performed:  Dislocation reduction   Procedure necessitating sedation performed by:  Different physician Pre-sedation assessment:    Time since last food or drink:  7   ASA classification: class 2 - patient with mild systemic disease     Neck mobility: normal     Mouth opening:  3 or more finger widths   Thyromental distance:  3 finger widths   Mallampati score:  II - soft palate, uvula, fauces visible   Pre-sedation assessments completed and reviewed: airway patency, cardiovascular function, hydration status, mental status, nausea/vomiting, pain level, respiratory function and temperature     Pre-sedation assessment completed:  10/11/2018 3:20 PM Immediate pre-procedure details:    Reassessment: Patient reassessed immediately prior to procedure     Reviewed: vital signs and NPO status     Verified: bag valve mask available, emergency equipment available, intubation equipment available, IV patency confirmed, oxygen available and suction available   Procedure details (see MAR for  exact dosages):    Preoxygenation:  Nasal cannula   Sedation:  Propofol   Intra-procedure monitoring:  Blood pressure monitoring, cardiac monitor, continuous capnometry, continuous pulse oximetry, frequent LOC assessments and frequent vital sign checks   Intra-procedure events: none     Total Provider sedation time (minutes):  11 Post-procedure details:    Post-sedation assessment completed:  10/11/2018 3:47 PM   Attendance: Constant attendance by certified staff until patient recovered     Recovery: Patient returned to pre-procedure baseline     Post-sedation assessments completed and reviewed: airway patency, cardiovascular function, hydration status, mental status, nausea/vomiting, pain level, respiratory function and temperature     Patient is stable for discharge or admission: yes     Patient tolerance:  Tolerated well, no immediate complications      Malvin Johns, MD 10/11/18 [phone]

## 2018-10-11 NOTE — ED Provider Notes (Signed)
[facility] EMERGENC[facility] DEPARTMENT Provider Note   CSN: [id number] Arrival date & time: 10/11/18  1343     History   Chief Complaint Chief Complaint  Patient presents with  . Shoulder Injury    HPI Kimberly Lin is a 73 y.o. female.     Patient with history of recurrent shoulder dislocations, last dislocation was 1 month ago presents the emergency department with right shoulder dislocation occurring acutely earlier today when she slipped in her bathtub and fell onto her right side.  Shoulder is obviously dislocated and feels to her like previous dislocations.  She has been reduced at least 3 previous times in the past.  Last time she needed propofol sedation.  Patient has some tingling but no full numbness.  No color change of the arm.  She denies hitting her head or losing consciousness.  States that she did not follow-up with an orthopedic doctor after her last dislocation because her arm is feeling better.  She denies any neck pain.  No back pain or hip pain.  Course is constant.  Pain is worse with movement.     Past Medical History:  Diagnosis Date  . Abdominal mass   . Abnormal nuclear stress test 07/05/2014  . Acute encephalopathy 03/26/2016  . Anxiety   . Arthritis    osteoarthritis  . Colitis 2014  . Depression   . Diabetes mellitus   . Fibromyalgia   . GERD (gastroesophageal reflux disease)   . Hiatal hernia   . History of chicken pox   . History of septic shock 12/2009  . Hyperlipidemia   . Hypertension   . Migraine   . OSA (obstructive sleep apnea) 03/12/2011  . Personal history of colonic polyps   . Sepsis secondary to UTI ([place]) 03/26/2016  . Spinal stenosis     Patient Active Problem List   Diagnosis Date Noted  . Anterior shoulder dislocation, right, initial encounter 10/29/2016  . Falls 03/26/2016  . Anxiety and depression 03/26/2016  . Altered mental status 03/25/2016  . Abnormal nuclear stress test 07/05/2014  . Fatty liver 03/26/2014   . Intertrigo 03/24/2014  . Allergic rhinitis 01/02/2014  . Incontinence, feces 11/10/2013  . Musculoskeletal pain 11/08/2013  . Dysuria 04/16/2013  . Benign paroxysmal positional vertigo 07/19/2012  . Obese 09/17/2011  . Knee pain 09/17/2011  . Weight gain 06/17/2011  . DOE (dyspnea on exertion) 04/15/2011  . OSA (obstructive sleep apnea) 03/12/2011  . Insomnia 01/15/2011  . Diabetes type 2, uncontrolled ([place]) 11/27/2010  . PVC (premature ventricular contraction) 11/27/2010  . Fibromyalgia   . Depression   . GERD (gastroesophageal reflux disease)   . Hyperlipidemia   . Hypertension   . Personal history of colonic polyps   . Migraine     Past Surgical History:  Procedure Laterality Date  . APPENDECTOMY  1983  . BREAST BIOPSY Right 2007, 2009  . CARDIAC CATHETERIZATION N/A 07/05/2014   Procedure: Left Heart Cath and Coronary Angiography;  Surgeon: Sherren Mocha, MD;  Location: [facility];  Service: Cardiovascular;  Laterality: N/A;  . COLONOSCOPY  2014   normal   . ESOPHAGOGASTRODUODENOSCOPY     in [place]  . MEDIAL PARTIAL KNEE REPLACEMENT Bilateral 2006  . OVARIAN CYST REMOVAL  2008   Pt states she had a ?cyst removed ?ovaries  . POLYPECTOMY  1998     OB History   No obstetric history on file.      Home Medications    Prior  to Admission medications   Medication Sig Start Date End Date Taking? Authorizing Provider  amLODipine (NORVASC) 10 MG tablet Take 1 tablet (10 mg total) by mouth daily. 04/24/17   Debbrah Alar, NP  aspirin 81 MG tablet Take 81 mg by mouth daily.      [provider]  atorvastatin (LIPITOR) 40 MG tablet Take 1 tablet (40 mg total) by mouth daily. 04/24/17   Debbrah Alar, NP  Calcium Carbonate-Vitamin D (CALCIUM-D) 600-400 MG-UNIT TABS Take 1 tablet by mouth daily.     [provider]  Coenzyme Q10 (CO Q 10) 100 MG CAPS Take 100 mg by mouth daily.    [provider]  dicyclomine (BENTYL) 20 MG  tablet Take 1 tablet (20 mg total) by mouth 2 (two) times daily. 08/21/17   Palumbo, April, MD  diphenhydrAMINE (BENADRYL) 25 MG tablet Take 25 mg by mouth every 6 (six) hours as needed for itching.     [provider]  escitalopram (LEXAPRO) 20 MG tablet Take 1 tablet (20 mg total) by mouth daily. 09/19/16   Debbrah Alar, NP  liraglutide (VICTOZA) 18 MG/3ML SOPN Inject 1.8mg  once daily 03/17/17   Debbrah Alar, NP  Magnesium Oxide 400 (240 Mg) MG TABS Take 1 tablet by mouth daily. 11/08/15   Debbrah Alar, NP  meclizine (ANTIVERT) 25 MG tablet Take 1 tablet (25 mg total) by mouth 3 (three) times daily as needed for dizziness. 01/01/16   Debbrah Alar, NP  metFORMIN (GLUCOPHAGE) 1000 MG tablet Take 1 tablet (1,000 mg total) by mouth 2 (two) times daily with a meal. 02/23/17   Debbrah Alar, NP  metoprolol succinate (TOPROL-XL) 50 MG 24 hr tablet Take 1 tablet (50 mg total) by mouth daily. Take with or immediately following a meal. 04/24/17   Debbrah Alar, NP  Multiple Vitamin (MULTIVITAMIN) tablet Take 1 tablet by mouth daily.      [provider]  Omega-3 Fatty Acids (FISH OIL) 1000 MG CAPS 2 caps by mouth twice daily Patient taking differently: Take 2,000 mg by mouth 2 (two) times daily.  04/16/11   Debbrah Alar, NP  ondansetron (ZOFRAN ODT) 8 MG disintegrating tablet 8mg  ODT q12 hours prn nausea 08/21/17   Palumbo, April, MD  oxyCODONE-acetaminophen (PERCOCET) 10-325 MG tablet Take 1 tablet by mouth every 8 (eight) hours as needed for pain (Back pain).    [provider]  pantoprazole (PROTONIX) 40 MG tablet Take 1 tablet (40 mg total) by mouth at bedtime. 02/23/17   Debbrah Alar, NP  Probiotic Product (PROBIOTIC COMPLEX ACIDOPHILUS) CAPS Take 1 capsule by mouth 4 (four) times daily - after meals and at bedtime. 08/21/17   Palumbo, April, MD  venlafaxine XR (EFFEXOR-XR) 75 MG 24 hr capsule TAKE TWO CAPSULES BY MOUTH ONCE DAILY  04/24/17   Debbrah Alar, NP    Family History Family History  Problem Relation Age of Onset  . Arthritis Mother   . Hyperlipidemia Mother   . Hypertension Mother   . Diabetes Mother   . Heart disease Mother   . Breast cancer Mother   . Lung cancer Brother   . Heart disease Brother        x 2  . Hypertension Brother   . Hyperlipidemia Brother   . Diabetes Brother   . Sudden death Father   . Lung cancer Father   . Rectal cancer Father   . Diabetes Sister   . Hyperlipidemia Sister   . Hypertension Sister   . Diabetes  Maternal Aunt   . Heart attack Maternal Aunt   . Hyperlipidemia Maternal Aunt   . Hypertension Maternal Aunt   . Hypertension Maternal Uncle   . Hyperlipidemia Maternal Uncle   . Heart attack Maternal Uncle   . Diabetes Maternal Uncle   . Diabetes Paternal Aunt   . Heart attack Paternal Aunt   . Hyperlipidemia Paternal Aunt   . Hypertension Paternal Aunt   . Hypertension Paternal Uncle   . Hyperlipidemia Paternal Uncle   . Heart attack Paternal Uncle   . Diabetes Paternal Uncle   . Prostate cancer Brother   . Stomach cancer Neg Hx   . Esophageal cancer Neg Hx     Social History Social History   Tobacco Use  . Smoking status: Former Smoker    Types: Cigarettes  . Smokeless tobacco: Never Used  . Tobacco comment: Smoked socially.  Substance Use Topics  . Alcohol use: Yes    Alcohol/week: 6.0 - 8.0 standard drinks    Types: 6 - 8 Glasses of wine per week  . Drug use: No     Allergies   Erythromycin, Losartan, and Prednisone   Review of Systems Review of Systems  Constitutional: Negative for activity change.  Musculoskeletal: Positive for arthralgias and myalgias. Negative for back pain, joint swelling and neck pain.  Skin: Negative for wound.  Neurological: Negative for weakness and numbness.     Physical Exam Updated Vital Signs BP (!) 158/85   Pulse 78   Temp 98.1 F (36.7 C) (Oral)   Resp 18   Ht 5\' 3"  (1.6 m)   Wt 77.1  kg   SpO2 97%   BMI 30.11 kg/m   Physical Exam Vitals signs and nursing note reviewed.  Constitutional:      Appearance: She is well-developed.  HENT:     Head: Normocephalic and atraumatic.  Eyes:     Pupils: Pupils are equal, round, and reactive to light.  Neck:     Musculoskeletal: Normal range of motion and neck supple.  Cardiovascular:     Pulses: Normal pulses. No decreased pulses.  Musculoskeletal:        General: Tenderness present.     Right shoulder: She exhibits decreased range of motion, tenderness, bony tenderness, deformity and spasm. She exhibits normal pulse.     Right elbow: Normal.    Cervical back: Normal.     Right upper arm: Normal.  Skin:    General: Skin is warm and dry.  Neurological:     Mental Status: She is alert.     Sensory: No sensory deficit.     Comments: Motor, sensation, and vascular distal to the injury is fully intact.       ED Treatments / Results  Labs (all labs ordered are listed, but only abnormal results are displayed) Labs Reviewed - No data to display  EKG None  Radiology Dg Shoulder Right  Result Date: 10/11/2018 CLINICAL DATA:  Right shoulder injury in a fall this morning. History of prior dislocation. Initial encounter. EXAM: RIGHT SHOULDER - 2+ VIEW COMPARISON:  Plain films right shoulder 09/04/2018. FINDINGS: The left shoulder is anteriorly dislocated. There appears to be a Hill-Sachs lesion. The acromioclavicular joint is intact with arthritis noted. Subacromial spurring is seen. IMPRESSION: Anterior left shoulder dislocation and likely Hill-Sachs lesion. Acromioclavicular osteoarthritis and subacromial spurring. Electronically Signed   By: Inge Rise M.D.   On: 10/11/2018 14:21    Procedures Reduction of dislocation  Date/Time: 10/11/2018 3:20 PM  Performed by: Carlisle Cater, PA-C Authorized by: Carlisle Cater, PA-C  Consent: Verbal consent obtained. Written consent obtained. Risks and benefits: risks,  benefits and alternatives were discussed Consent given by: patient Imaging studies: imaging studies available Patient identity confirmed: verbally with patient, arm band, provided demographic data and hospital-assigned identification number Local anesthesia used: no  Anesthesia: Local anesthesia used: no  Sedation: Patient sedated: yes  Patient tolerance: patient tolerated the procedure well with no immediate complications Comments: Shoulder reduced with traction. See Dr. Mollie Germany note for sedation details.     (including critical care time)  Medications Ordered in ED Medications  propofol (DIPRIVAN) 10 mg/mL bolus/IV push 77.1 mg (77.1 mg Intravenous Given 10/11/18 1525)     Initial Impression / Assessment and Plan / ED Course  I have reviewed the triage vital signs and the nursing notes.  Pertinent labs & imaging results that were available during my care of the patient were reviewed by me and considered in my medical decision making (see chart for details).  Clinical Course as of Oct 10 1445  Mon Aug 31, 219  [id number] 73 year old female complaining of acute right shoulder pain after a slip and fall in the tub.  History of recurrent dislocations on that right side.  She is complaining of some numbness in the arm but she has sensation.  Clinically she is got an anterior dislocation and x-ray confirms that along with a Hill-Sachs deformity.  She has had propofol sedation before with good results so anticipate we will set her up for that.   [MB]    Clinical Course User Index [MB] Hayden Rasmussen, MD       3:41 PM sedation/reduction performed.  Pending post reduction film.  Patient recovering from sedation without any immediate complications.  Upper extremity pulses intact following reduction.  Patient in sling.   4:18 PM patient doing well, back to her baseline.  She is calling for a ride.  Ortho referral given.  Patient encouraged to use sling and not to do any reaching or  lifting with her arm until follow-up.  Encouraged return with any complications or recurrent dislocations.  Final Clinical Impressions(s) / ED Diagnoses   Final diagnoses:  Dislocation of right shoulder joint, initial encounter   Patient with recurrent right shoulder dislocation.  Reduced without any issues today.  Upper extremity remains neurovascularly intact.  Patient will need to follow-up with an orthopedist.   ED Discharge Orders    None       Carlisle Cater, PA-C 10/11/18 [place]    Malvin Johns, MD 10/11/18 [phone]

## 2018-11-26 ENCOUNTER — Emergency Department (HOSPITAL_BASED_OUTPATIENT_CLINIC_OR_DEPARTMENT_OTHER): Payer: Medicare Other

## 2018-11-26 ENCOUNTER — Other Ambulatory Visit: Payer: Self-pay

## 2018-11-26 ENCOUNTER — Emergency Department (HOSPITAL_BASED_OUTPATIENT_CLINIC_OR_DEPARTMENT_OTHER)
Admission: EM | Admit: 2018-11-26 | Discharge: 2018-11-26 | Disposition: A | Discharge status: Home / Self Care | Payer: Medicare Other | Attending: Emergency Medicine | Admitting: Emergency Medicine

## 2018-11-26 DIAGNOSIS — Y929 Unspecified place or not applicable: Secondary | ICD-10-CM | POA: Diagnosis not present

## 2018-11-26 DIAGNOSIS — Y999 Unspecified external cause status: Secondary | ICD-10-CM | POA: Insufficient documentation

## 2018-11-26 DIAGNOSIS — Z7982 Long term (current) use of aspirin: Secondary | ICD-10-CM | POA: Insufficient documentation

## 2018-11-26 DIAGNOSIS — I10 Essential (primary) hypertension: Secondary | ICD-10-CM | POA: Diagnosis not present

## 2018-11-26 DIAGNOSIS — Z7984 Long term (current) use of oral hypoglycemic drugs: Secondary | ICD-10-CM | POA: Diagnosis not present

## 2018-11-26 DIAGNOSIS — S43004A Unspecified dislocation of right shoulder joint, initial encounter: Secondary | ICD-10-CM | POA: Insufficient documentation

## 2018-11-26 DIAGNOSIS — M25511 Pain in right shoulder: Secondary | ICD-10-CM | POA: Diagnosis present

## 2018-11-26 DIAGNOSIS — E119 Type 2 diabetes mellitus without complications: Secondary | ICD-10-CM | POA: Diagnosis not present

## 2018-11-26 DIAGNOSIS — Y939 Activity, unspecified: Secondary | ICD-10-CM | POA: Diagnosis not present

## 2018-11-26 DIAGNOSIS — Z87891 Personal history of nicotine dependence: Secondary | ICD-10-CM | POA: Insufficient documentation

## 2018-11-26 DIAGNOSIS — W0110XA Fall on same level from slipping, tripping and stumbling with subsequent striking against unspecified object, initial encounter: Secondary | ICD-10-CM | POA: Diagnosis not present

## 2018-11-26 DIAGNOSIS — W19XXXA Unspecified fall, initial encounter: Secondary | ICD-10-CM

## 2018-11-26 DIAGNOSIS — Z79899 Other long term (current) drug therapy: Secondary | ICD-10-CM | POA: Insufficient documentation

## 2018-11-26 MED ORDER — SODIUM CHLORIDE 0.9 % IV BOLUS
1000.0000 mL | Freq: Once | INTRAVENOUS | Status: AC
  Administered 2018-11-26: 1000 mL via INTRAVENOUS

## 2018-11-26 MED ORDER — PROPOFOL 10 MG/ML IV BOLUS
1.0000 mg/kg | Freq: Once | INTRAVENOUS | Status: DC
  Filled 2018-11-26: qty 20

## 2018-11-26 MED ORDER — ONDANSETRON HCL 4 MG/2ML IJ SOLN
4.0000 mg | Freq: Once | INTRAMUSCULAR | Status: AC
  Administered 2018-11-26: 4 mg via INTRAVENOUS
  Filled 2018-11-26: qty 2

## 2018-11-26 MED ORDER — HYDROMORPHONE HCL 1 MG/ML IJ SOLN
1.0000 mg | Freq: Once | INTRAMUSCULAR | Status: AC
  Administered 2018-11-26: 1 mg via INTRAVENOUS
  Filled 2018-11-26: qty 1

## 2018-11-26 MED ORDER — OXYCODONE-ACETAMINOPHEN 5-325 MG PO TABS: 1.0000 | ORAL_TABLET | Freq: Three times a day (TID) | ORAL | 0 refills | Status: DC | PRN

## 2018-11-26 NOTE — ED Provider Notes (Signed)
[facility] Provider Note   CSN: [id number] Arrival date & time: 11/26/18  0941     History   Chief Complaint Chief Complaint  Patient presents with  . Shoulder Pain    HPI Kimberly Lin is a 73 y.o. female.     HPI   73 year old female with history of arthritis, diabetes, GERD, hyperlipidemia, hypertension, OSA, who presents the emergency department today complaining of right shoulder pain and right upper arm pain.  States that last night she was walking to the bathroom when she accidentally urinated on the ground, slipped in her urine and fell onto her right shoulder.  States she hit her right shoulder on the bathroom commode.  She denies any head trauma.  She does state that she has some pain to the right trapezius area and to the right side of her neck.  She denies any numbness/weakness to the upper or lower extremities.  States pain is constant and severe in nature.  It started suddenly.  Reviewed records, patient has had multiple shoulder dislocations over the last several months.  States she is supposed to get surgery but has not been able to because her husband broke his leg and she is his main caregiver.    Past Medical History:  Diagnosis Date  . Abdominal mass   . Abnormal nuclear stress test 07/05/2014  . Acute encephalopathy 03/26/2016  . Anxiety   . Arthritis    osteoarthritis  . Colitis 2014  . Depression   . Diabetes mellitus   . Fibromyalgia   . GERD (gastroesophageal reflux disease)   . Hiatal hernia   . History of chicken pox   . History of septic shock 12/2009  . Hyperlipidemia   . Hypertension   . Migraine   . OSA (obstructive sleep apnea) 03/12/2011  . Personal history of colonic polyps   . Sepsis secondary to UTI ([place]) 03/26/2016  . Spinal stenosis     Patient Active Problem List   Diagnosis Date Noted  . Anterior shoulder dislocation, right, initial encounter 10/29/2016  . Falls 03/26/2016  . Anxiety and  depression 03/26/2016  . Altered mental status 03/25/2016  . Abnormal nuclear stress test 07/05/2014  . Fatty liver 03/26/2014  . Intertrigo 03/24/2014  . Allergic rhinitis 01/02/2014  . Incontinence, feces 11/10/2013  . Musculoskeletal pain 11/08/2013  . Dysuria 04/16/2013  . Benign paroxysmal positional vertigo 07/19/2012  . Obese 09/17/2011  . Knee pain 09/17/2011  . Weight gain 06/17/2011  . DOE (dyspnea on exertion) 04/15/2011  . OSA (obstructive sleep apnea) 03/12/2011  . Insomnia 01/15/2011  . Diabetes type 2, uncontrolled ([place]) 11/27/2010  . PVC (premature ventricular contraction) 11/27/2010  . Fibromyalgia   . Depression   . GERD (gastroesophageal reflux disease)   . Hyperlipidemia   . Hypertension   . Personal history of colonic polyps   . Migraine     Past Surgical History:  Procedure Laterality Date  . APPENDECTOMY  1983  . BREAST BIOPSY Right 2007, 2009  . CARDIAC CATHETERIZATION N/A 07/05/2014   Procedure: Left Heart Cath and Coronary Angiography;  Surgeon: Sherren Mocha, MD;  Location: [facility];  Service: Cardiovascular;  Laterality: N/A;  . COLONOSCOPY  2014   normal   . ESOPHAGOGASTRODUODENOSCOPY     in [place]  . MEDIAL PARTIAL KNEE REPLACEMENT Bilateral 2006  . OVARIAN CYST REMOVAL  2008   Pt states she had a ?cyst removed ?ovaries  . POLYPECTOMY  1998  OB History   No obstetric history on file.      Home Medications    Prior to Admission medications   Medication Sig Start Date End Date Taking? Authorizing Provider  amLODipine (NORVASC) 10 MG tablet Take 1 tablet (10 mg total) by mouth daily. 04/24/17   Debbrah Alar, NP  aspirin 81 MG tablet Take 81 mg by mouth daily.      [provider]  atorvastatin (LIPITOR) 40 MG tablet Take 1 tablet (40 mg total) by mouth daily. 04/24/17   Debbrah Alar, NP  Calcium Carbonate-Vitamin D (CALCIUM-D) 600-400 MG-UNIT TABS Take 1 tablet by mouth daily.     [provider]  Coenzyme Q10 (CO Q 10) 100 MG CAPS Take 100 mg by mouth daily.    [provider]  dicyclomine (BENTYL) 20 MG tablet Take 1 tablet (20 mg total) by mouth 2 (two) times daily. 08/21/17   Palumbo, April, MD  diphenhydrAMINE (BENADRYL) 25 MG tablet Take 25 mg by mouth every 6 (six) hours as needed for itching.     [provider]  escitalopram (LEXAPRO) 20 MG tablet Take 1 tablet (20 mg total) by mouth daily. 09/19/16   Debbrah Alar, NP  liraglutide (VICTOZA) 18 MG/3ML SOPN Inject 1.8mg  once daily 03/17/17   Debbrah Alar, NP  Magnesium Oxide 400 (240 Mg) MG TABS Take 1 tablet by mouth daily. 11/08/15   Debbrah Alar, NP  meclizine (ANTIVERT) 25 MG tablet Take 1 tablet (25 mg total) by mouth 3 (three) times daily as needed for dizziness. 01/01/16   Debbrah Alar, NP  metFORMIN (GLUCOPHAGE) 1000 MG tablet Take 1 tablet (1,000 mg total) by mouth 2 (two) times daily with a meal. 02/23/17   Debbrah Alar, NP  metoprolol succinate (TOPROL-XL) 50 MG 24 hr tablet Take 1 tablet (50 mg total) by mouth daily. Take with or immediately following a meal. 04/24/17   Debbrah Alar, NP  Multiple Vitamin (MULTIVITAMIN) tablet Take 1 tablet by mouth daily.      [provider]  Omega-3 Fatty Acids (FISH OIL) 1000 MG CAPS 2 caps by mouth twice daily Patient taking differently: Take 2,000 mg by mouth 2 (two) times daily.  04/16/11   Debbrah Alar, NP  ondansetron (ZOFRAN ODT) 8 MG disintegrating tablet 8mg  ODT q12 hours prn nausea 08/21/17   Palumbo, April, MD  oxyCODONE-acetaminophen (PERCOCET/ROXICET) 5-325 MG tablet Take 1 tablet by mouth every 8 (eight) hours as needed for severe pain. 11/26/18   Lashaye Fisk S, PA-C  pantoprazole (PROTONIX) 40 MG tablet Take 1 tablet (40 mg total) by mouth at bedtime. 02/23/17   Debbrah Alar, NP  Probiotic Product (PROBIOTIC COMPLEX ACIDOPHILUS) CAPS Take 1 capsule by mouth 4 (four) times daily -  after meals and at bedtime. 08/21/17   Palumbo, April, MD  venlafaxine XR (EFFEXOR-XR) 75 MG 24 hr capsule TAKE TWO CAPSULES BY MOUTH ONCE DAILY 04/24/17   Debbrah Alar, NP    Family History Family History  Problem Relation Age of Onset  . Arthritis Mother   . Hyperlipidemia Mother   . Hypertension Mother   . Diabetes Mother   . Heart disease Mother   . Breast cancer Mother   . Lung cancer Brother   . Heart disease Brother        x 2  . Hypertension Brother   . Hyperlipidemia Brother   . Diabetes Brother   . Sudden death Father   . Lung cancer Father   . Rectal cancer  Father   . Diabetes Sister   . Hyperlipidemia Sister   . Hypertension Sister   . Diabetes Maternal Aunt   . Heart attack Maternal Aunt   . Hyperlipidemia Maternal Aunt   . Hypertension Maternal Aunt   . Hypertension Maternal Uncle   . Hyperlipidemia Maternal Uncle   . Heart attack Maternal Uncle   . Diabetes Maternal Uncle   . Diabetes Paternal Aunt   . Heart attack Paternal Aunt   . Hyperlipidemia Paternal Aunt   . Hypertension Paternal Aunt   . Hypertension Paternal Uncle   . Hyperlipidemia Paternal Uncle   . Heart attack Paternal Uncle   . Diabetes Paternal Uncle   . Prostate cancer Brother   . Stomach cancer Neg Hx   . Esophageal cancer Neg Hx     Social History Social History   Tobacco Use  . Smoking status: Former Smoker    Types: Cigarettes  . Smokeless tobacco: Never Used  . Tobacco comment: Smoked socially.  Substance Use Topics  . Alcohol use: Yes    Alcohol/week: 6.0 - 8.0 standard drinks    Types: 6 - 8 Glasses of wine per week  . Drug use: No     Allergies   Erythromycin, Losartan, and Prednisone   Review of Systems Review of Systems  Constitutional: Negative for fever.  HENT: Negative for sore throat.   Eyes: Negative for visual disturbance.  Respiratory: Negative for shortness of breath.   Cardiovascular: Negative for chest pain.  Gastrointestinal: Negative for  abdominal pain.  Genitourinary: Negative for flank pain.  Musculoskeletal:       Right neck pain, right trapezius pain, right arm pain  Skin: Positive for color change.  Neurological:       Denies head trauma or loc     Physical Exam Updated Vital Signs BP (!) 165/87   Pulse 72   Temp 97.7 F (36.5 C) (Oral)   Resp 14   Ht 5' 3.5" (1.613 m)   Wt 78 kg   SpO2 100%   BMI 29.99 kg/m   Physical Exam Vitals signs and nursing note reviewed.  Constitutional:      General: She is not in acute distress.    Appearance: She is well-developed.  HENT:     Head: Normocephalic and atraumatic.  Eyes:     Conjunctiva/sclera: Conjunctivae normal.  Neck:     Musculoskeletal: Neck supple.  Cardiovascular:     Rate and Rhythm: Normal rate and regular rhythm.  Pulmonary:     Effort: Pulmonary effort is normal.     Breath sounds: Normal breath sounds.  Musculoskeletal: Normal range of motion.     Comments: Mild TTP to the right shoulder, appears dislocated on exam. TTP with ecchymosis to the mid humerus. NVI distally. No midline TTP to the cervical, thoracic or lumbar spine. TTP to the cervical paraspinous muscles and to the trapezius.   Skin:    General: Skin is warm and dry.  Neurological:     Mental Status: She is alert.     Comments: Mental Status:  Alert, thought content appropriate, able to give a coherent history. Speech fluent without evidence of aphasia. Able to follow 2 step commands without difficulty.  Cranial Nerves:  II: pupils equal, round, reactive to light III,IV, VI: ptosis not present, extra-ocular motions intact bilaterally  V,VII: smile symmetric, facial light touch sensation equal VIII: hearing grossly normal to voice  X: uvula elevates symmetrically  XI: bilateral shoulder shrug symmetric and  strong XII: midline tongue extension without fassiculations Motor:  Normal tone. Strength grossly intact of BUE and BLE major muscle groups including strong and equal grip  strength and dorsiflexion/plantar flexion Sensory: light touch normal in all extremities.      ED Treatments / Results  Labs (all labs ordered are listed, but only abnormal results are displayed) Labs Reviewed - No data to display  EKG None  Radiology Dg Shoulder 1 View Right  Result Date: 11/26/2018 CLINICAL DATA:  Right shoulder dislocation postreduction. EXAM: RIGHT SHOULDER - 1 VIEW COMPARISON:  Same day x-ray FINDINGS: Single view of the right shoulder suggests anatomic alignment at the glenohumeral joint status post reduction contour deformity of the superolateral humeral head suggesting a Hill-Sachs impaction fracture. No obvious glenoid fracture on provided view. Degenerative changes of the [facility] joint. IMPRESSION: 1. Anatomic alignment of the right glenohumeral joint status post reduction. 2. Hill-Sachs impaction fracture of the superolateral humeral head is suggested on provided view. Electronically Signed   By: Davina Poke M.D.   On: 11/26/2018 12:45   Dg Shoulder Right  Result Date: 11/26/2018 CLINICAL DATA:  Fall, shoulder pain EXAM: RIGHT SHOULDER - 2+ VIEW COMPARISON:  None. FINDINGS: There is anterior dislocation at the right glenohumeral joint. No visible fracture. Degenerative changes in the right AC joint. IMPRESSION: Anterior right shoulder dislocation. Electronically Signed   By: Rolm Baptise M.D.   On: 11/26/2018 10:52   Dg Humerus Right  Result Date: 11/26/2018 CLINICAL DATA:  Fall, right arm pain EXAM: RIGHT HUMERUS - 2+ VIEW COMPARISON:  Right shoulder today FINDINGS: There is anterior right shoulder dislocation. No visible fracture. Soft tissues are intact. IMPRESSION: Anterior right shoulder dislocation. Electronically Signed   By: Rolm Baptise M.D.   On: 11/26/2018 10:52    Procedures Reduction of dislocation  Date/Time: 11/26/2018 12:45 PM Performed by: Rodney Booze, PA-C Authorized by: Rodney Booze, PA-C  Consent: Verbal consent obtained.  Risks and benefits: risks, benefits and alternatives were discussed Consent given by: patient Patient understanding: patient states understanding of the procedure being performed Patient consent: the patient's understanding of the procedure matches consent given Procedure consent: procedure consent matches procedure scheduled Relevant documents: relevant documents present and verified Test results: test results available and properly labeled Site marked: na. Imaging studies: imaging studies available Required items: required blood products, implants, devices, and special equipment available Patient identity confirmed: verbally with patient Time out: Immediately prior to procedure a "time out" was called to verify the correct patient, procedure, equipment, support staff and site/side marked as required. Preparation: Patient was prepped and draped in the usual sterile fashion. Local anesthesia used: no  Anesthesia: Local anesthesia used: no  Sedation: Patient sedated: yes Sedatives: propofol Analgesia: dilaudid. Vitals: Vital signs were monitored during sedation.  Patient tolerance: patient tolerated the procedure well with no immediate complications    (including critical care time)  Medications Ordered in ED Medications  propofol (DIPRIVAN) 10 mg/mL bolus/IV push 78 mg (78 mg Intravenous See Procedure Record 11/26/18 1207)  HYDROmorphone (DILAUDID) injection 1 mg (1 mg Intravenous Given 11/26/18 1026)  sodium chloride 0.9 % bolus 1,000 mL (0 mLs Intravenous Stopped 11/26/18 1206)  ondansetron (ZOFRAN) injection 4 mg (4 mg Intravenous Given 11/26/18 1118)     Initial Impression / Assessment and Plan / ED Course  I have reviewed the triage vital signs and the nursing notes.  Pertinent labs & imaging results that were available during my care of the patient were reviewed by  me and considered in my medical decision making (see chart for details).    Final Clinical  Impressions(s) / ED Diagnoses   Final diagnoses:  Fall, initial encounter  Dislocation of right shoulder joint, initial encounter   73 year old female presenting for right shoulder pain and right arm pain after mechanical fall last night.  No head trauma or LOC.  No midline tenderness to the cervical, thoracic or lumbar spine.  Does have some tenderness to the right shoulder and humerus with ecchymosis.  Of note, she has had multiple shoulder dislocations in the last several month  X-ray right shoulder with anterior dislocation X-ray right humerus w/o fracture  Conscious sedation was performed by Dr. Ronnald Nian and closed reduction was performed with successful reduction of the shoulder. Pt tolerated well and returned to baseline following sedation.   Xray right shoulder Anatomic alignment of the right glenohumeral joint status post reduction. Hill-Sachs impaction fracture of the superolateral humeral head is suggested on provided view.  Pt given rx for pain meds. She was advised to f/u with orthopedics and return to the ED for new or worsening symptoms. She voices understanding of the plan and reasons to return. All questions answered, pt stable for discharge.   ED Discharge Orders         Ordered    oxyCODONE-acetaminophen (PERCOCET/ROXICET) 5-325 MG tablet  Every 8 hours PRN     11/26/18 1253           Mitcheal Sweetin S, PA-C 11/26/18 [place], [place], DO 11/26/18 1349

## 2018-11-26 NOTE — ED Notes (Signed)
Pt awake, alert, denies pain at this time, VS at baseline

## 2018-11-26 NOTE — Sedation Documentation (Signed)
Pt comfortable, resting quietly with eyes closed, rouses easily to verbal.

## 2018-11-26 NOTE — ED Notes (Signed)
Sling in place to right shoulder, +radial pulse, cap refill less than 2 seconds

## 2018-11-26 NOTE — Sedation Documentation (Signed)
Sling applied to right arm s/p reduction

## 2018-11-26 NOTE — Sedation Documentation (Signed)
Vital signs stable. 

## 2018-11-26 NOTE — Discharge Instructions (Signed)
Prescription given for Percocet. Take medication as directed and do not operate machinery, drive a car, or work while taking this medication as it can make you drowsy.   You will need to follow-up with your orthopedic doctor for evaluation of your shoulder.  Return to the emergency department for new or worsening symptoms in the meantime.

## 2018-11-26 NOTE — ED Triage Notes (Addendum)
Pt states slipped and struck right shoulder on commode last night,  Applied sling at home that she had on hand from a previous injury.  Able to move fingers.  Applied ice last night and took percocet for pain she had on hand left over from another injury

## 2018-11-26 NOTE — Sedation Documentation (Signed)
Post reduction xr complete, sling and immobilizer in place, alignment appropriate per Dr Ronnald Nian.

## 2018-11-26 NOTE — Sedation Documentation (Addendum)
Propofol 30 mg given

## 2018-11-26 NOTE — Sedation Documentation (Signed)
Patient denies pain and is resting comfortably.  

## 2018-11-26 NOTE — ED Provider Notes (Addendum)
Medical screening examination/treatment/procedure(s) were conducted as a shared visit with non-physician practitioner(s) and myself.  I personally evaluated the patient during the encounter. Briefly, the patient is a 73 y.o. female who presents the ED with right shoulder pain.  History of dislocations in the past.  [place] last night.  Has pain in the right shoulder area.  However neurovascularly and neuromuscularly intact.  Has been unable to get shoulder surgery as has been recommended for her given her shoulder instability.  Will do propofol sedation as patient appears to have anterior shoulder dislocation.  Has not had issues with anesthesia in the past.  Overall patient tolerated sedation well.  Successful reduction of dislocation was performed.  She will follow-up with orthopedics.  Patient had good strength and sensation in pulses after reduction.  This chart was dictated using voice recognition software.  Despite best efforts to proofread,  errors can occur which can change the documentation meaning.     EKG Interpretation None        .Sedation  Date/Time: 11/26/2018 10:56 AM Performed by: Lennice Sites, DO Authorized by: Lennice Sites, DO   Consent:    Consent obtained:  Verbal   Consent given by:  Patient   Risks discussed:  Allergic reaction, dysrhythmia, inadequate sedation, nausea, prolonged hypoxia resulting in organ damage, prolonged sedation necessitating reversal, respiratory compromise necessitating ventilatory assistance and intubation and vomiting   Alternatives discussed:  Analgesia without sedation, anxiolysis and regional anesthesia Universal protocol:    Procedure explained and questions answered to patient or proxy's satisfaction: yes     Relevant documents present and verified: yes     Test results available and properly labeled: yes     Imaging studies available: yes     Required blood products, implants, devices, and special equipment available: yes    Site/side marked: yes     Immediately prior to procedure a time out was called: yes     Patient identity confirmation method:  Verbally with patient and arm band Indications:    Procedure performed:  Dislocation reduction   Procedure necessitating sedation performed by:  Physician performing sedation Pre-sedation assessment:    Time since last food or drink:  8 hours   ASA classification: class 2 - patient with mild systemic disease     Neck mobility: normal     Mouth opening:  3 or more finger widths   Thyromental distance:  4 finger widths   Mallampati score:  I - soft palate, uvula, fauces, pillars visible   Pre-sedation assessments completed and reviewed: airway patency, cardiovascular function, hydration status, mental status, nausea/vomiting, pain level, respiratory function and temperature     Pre-sedation assessment completed:  11/26/2018 10:56 AM Immediate pre-procedure details:    Reassessment: Patient reassessed immediately prior to procedure     Reviewed: vital signs, relevant labs/tests and NPO status     Verified: bag valve mask available, emergency equipment available, intubation equipment available, IV patency confirmed, oxygen available and suction available   Procedure details (see MAR for exact dosages):    Preoxygenation:  Nasal cannula   Sedation:  Propofol   Intended level of sedation: deep   Intra-procedure monitoring:  Blood pressure monitoring, cardiac monitor, continuous pulse oximetry, frequent LOC assessments, frequent vital sign checks and continuous capnometry   Intra-procedure events: none     Total Provider sedation time (minutes):  15 Post-procedure details:    Post-sedation assessment completed:  11/26/2018 12:35 PM   Attendance: Constant attendance by certified staff until patient recovered  Recovery: Patient returned to pre-procedure baseline     Post-sedation assessments completed and reviewed: airway patency, cardiovascular function, hydration  status, mental status, nausea/vomiting, pain level, respiratory function and temperature     Patient is stable for discharge or admission: yes     Patient tolerance:  Tolerated well, no immediate complications       Lennice Sites, DO 11/26/18 [place], [place], DO 11/26/18 1248

## 2019-04-02 ENCOUNTER — Emergency Department (HOSPITAL_BASED_OUTPATIENT_CLINIC_OR_DEPARTMENT_OTHER): Payer: Medicare Other

## 2019-04-02 ENCOUNTER — Encounter (HOSPITAL_BASED_OUTPATIENT_CLINIC_OR_DEPARTMENT_OTHER): Payer: Self-pay | Admitting: Emergency Medicine

## 2019-04-02 ENCOUNTER — Other Ambulatory Visit: Payer: Self-pay

## 2019-04-02 ENCOUNTER — Emergency Department (HOSPITAL_BASED_OUTPATIENT_CLINIC_OR_DEPARTMENT_OTHER)
Admission: EM | Admit: 2019-04-02 | Discharge: 2019-04-02 | Disposition: A | Discharge status: Home / Self Care | Payer: Medicare Other | Attending: Emergency Medicine | Admitting: Emergency Medicine

## 2019-04-02 DIAGNOSIS — S0990XA Unspecified injury of head, initial encounter: Secondary | ICD-10-CM | POA: Diagnosis not present

## 2019-04-02 DIAGNOSIS — W010XXA Fall on same level from slipping, tripping and stumbling without subsequent striking against object, initial encounter: Secondary | ICD-10-CM | POA: Insufficient documentation

## 2019-04-02 DIAGNOSIS — Z87891 Personal history of nicotine dependence: Secondary | ICD-10-CM | POA: Insufficient documentation

## 2019-04-02 DIAGNOSIS — Y92009 Unspecified place in unspecified non-institutional (private) residence as the place of occurrence of the external cause: Secondary | ICD-10-CM | POA: Diagnosis not present

## 2019-04-02 DIAGNOSIS — E785 Hyperlipidemia, unspecified: Secondary | ICD-10-CM | POA: Diagnosis not present

## 2019-04-02 DIAGNOSIS — Y9301 Activity, walking, marching and hiking: Secondary | ICD-10-CM | POA: Insufficient documentation

## 2019-04-02 DIAGNOSIS — N3000 Acute cystitis without hematuria: Secondary | ICD-10-CM | POA: Diagnosis not present

## 2019-04-02 DIAGNOSIS — S43014A Anterior dislocation of right humerus, initial encounter: Secondary | ICD-10-CM | POA: Diagnosis not present

## 2019-04-02 DIAGNOSIS — S2242XA Multiple fractures of ribs, left side, initial encounter for closed fracture: Secondary | ICD-10-CM

## 2019-04-02 DIAGNOSIS — M542 Cervicalgia: Secondary | ICD-10-CM | POA: Insufficient documentation

## 2019-04-02 DIAGNOSIS — I10 Essential (primary) hypertension: Secondary | ICD-10-CM | POA: Diagnosis not present

## 2019-04-02 DIAGNOSIS — Y999 Unspecified external cause status: Secondary | ICD-10-CM | POA: Insufficient documentation

## 2019-04-02 DIAGNOSIS — Z7982 Long term (current) use of aspirin: Secondary | ICD-10-CM | POA: Diagnosis not present

## 2019-04-02 DIAGNOSIS — E119 Type 2 diabetes mellitus without complications: Secondary | ICD-10-CM | POA: Insufficient documentation

## 2019-04-02 DIAGNOSIS — Z7984 Long term (current) use of oral hypoglycemic drugs: Secondary | ICD-10-CM | POA: Insufficient documentation

## 2019-04-02 DIAGNOSIS — M797 Fibromyalgia: Secondary | ICD-10-CM | POA: Insufficient documentation

## 2019-04-02 DIAGNOSIS — S4991XA Unspecified injury of right shoulder and upper arm, initial encounter: Secondary | ICD-10-CM | POA: Diagnosis present

## 2019-04-02 DIAGNOSIS — S43004A Unspecified dislocation of right shoulder joint, initial encounter: Secondary | ICD-10-CM

## 2019-04-02 DIAGNOSIS — Z79899 Other long term (current) drug therapy: Secondary | ICD-10-CM | POA: Diagnosis not present

## 2019-04-02 LAB — COMPREHENSIVE METABOLIC PANEL
ALT: 16 U/L (ref 0–44)
AST: 26 U/L (ref 15–41)
Albumin: 3.9 g/dL (ref 3.5–5.0)
Alkaline Phosphatase: 61 U/L (ref 38–126)
Anion gap: 9 (ref 5–15)
BUN: 17 mg/dL (ref 8–23)
CO2: 27 mmol/L (ref 22–32)
Calcium: 8.6 mg/dL — ABNORMAL LOW (ref 8.9–10.3)
Chloride: 101 mmol/L (ref 98–111)
Creatinine, Ser: 0.69 mg/dL (ref 0.44–1.00)
GFR calc Af Amer: >60 mL/min (ref >60)
GFR calc non Af Amer: >60 mL/min (ref >60)
Glucose, Bld: 196 mg/dL — ABNORMAL HIGH (ref 70–99)
Potassium: 3.9 mmol/L (ref 3.5–5.1)
Sodium: 137 mmol/L (ref 135–145)
Total Bilirubin: 0.6 mg/dL (ref 0.3–1.2)
Total Protein: 7.6 g/dL (ref 6.5–8.1)

## 2019-04-02 LAB — CBC
HCT: 47.2 % — ABNORMAL HIGH (ref 36.0–46.0)
Hemoglobin: 15.8 g/dL — ABNORMAL HIGH (ref 12.0–15.0)
MCH: 32.1 pg (ref 26.0–34.0)
MCHC: 33.5 g/dL (ref 30.0–36.0)
MCV: 95.9 fL (ref 80.0–100.0)
Platelets: 234 10*3/uL (ref 150–400)
RBC: 4.92 MIL/uL (ref 3.87–5.11)
RDW: 12.9 % (ref 11.5–15.5)
WBC: 10.1 10*3/uL (ref 4.0–10.5)
nRBC: 0 % (ref 0.0–0.2)

## 2019-04-02 LAB — URINALYSIS, MICROSCOPIC (REFLEX)

## 2019-04-02 LAB — URINALYSIS, ROUTINE W REFLEX MICROSCOPIC
Bilirubin Urine: NEGATIVE
Glucose, UA: 250 mg/dL — AB
Hgb urine dipstick: NEGATIVE
Ketones, ur: NEGATIVE mg/dL
Nitrite: POSITIVE — AB
Protein, ur: NEGATIVE mg/dL
Specific Gravity, Urine: 1.02 (ref 1.005–1.030)
pH: 8 (ref 5.0–8.0)

## 2019-04-02 MED ORDER — IOHEXOL 300 MG/ML  SOLN
100.0000 mL | Freq: Once | INTRAMUSCULAR | Status: AC | PRN
  Administered 2019-04-02: 15:00:00 100 mL via INTRAVENOUS

## 2019-04-02 MED ORDER — FENTANYL CITRATE (PF) 100 MCG/2ML IJ SOLN
50.0000 ug | Freq: Once | INTRAMUSCULAR | Status: AC
  Administered 2019-04-02: 50 ug via INTRAVENOUS
  Filled 2019-04-02: qty 2

## 2019-04-02 MED ORDER — CEPHALEXIN 500 MG PO CAPS: 500.0000 mg | ORAL_CAPSULE | Freq: Two times a day (BID) | ORAL | 0 refills | Status: DC

## 2019-04-02 MED ORDER — OXYCODONE HCL 5 MG PO TABS: 5.0000 mg | ORAL_TABLET | Freq: Four times a day (QID) | ORAL | 0 refills | Status: DC | PRN

## 2019-04-02 MED ORDER — OXYCODONE-ACETAMINOPHEN 5-325 MG PO TABS
1.0000 | ORAL_TABLET | Freq: Once | ORAL | Status: AC
  Administered 2019-04-02: 1 via ORAL
  Filled 2019-04-02: qty 1

## 2019-04-02 MED ORDER — PROPOFOL 10 MG/ML IV BOLUS
1.0000 mg/kg | Freq: Once | INTRAVENOUS | Status: DC
  Filled 2019-04-02: qty 20

## 2019-04-02 MED ORDER — CEPHALEXIN 500 MG PO CAPS: 500.0000 mg | ORAL_CAPSULE | Freq: Two times a day (BID) | ORAL | 0 refills | Status: AC

## 2019-04-02 NOTE — ED Provider Notes (Signed)
[facility] EMERGENCY DEPARTMENT Provider Note   CSN: [id number] Arrival date & time: 04/02/19  1219     History Chief Complaint  Patient presents with  . Fall    shoulder pain    Kimberly Lin is a 74 y.o. female.  Presented to ER with chief complaint fall.  Patient fell last week Sunday and again today.  Both were ground-level falls, landing on hard surface.  Fall last week landed on her left side.  Complaining of pain in her left ribs.  Thinks she also may have hit her head.  Also had neck pain since that time.  No difficulty in breathing.  No fever cough.  Yesterday tripped and fell landing on her right side again on the hardwood floor.  Has had prior shoulder dislocations, does not want to follow-up with orthopedics for surgery.  Pain worse in right shoulder, currently 8 out of 10 in severity, sharp, stabbing, no associated numbness or weakness or tingling in her arm or hand.  HPI     Past Medical History:  Diagnosis Date  . Abdominal mass   . Abnormal nuclear stress test 07/05/2014  . Acute encephalopathy 03/26/2016  . Anxiety   . Arthritis    osteoarthritis  . Colitis 2014  . Depression   . Diabetes mellitus   . Fibromyalgia   . GERD (gastroesophageal reflux disease)   . Hiatal hernia   . History of chicken pox   . History of septic shock 12/2009  . Hyperlipidemia   . Hypertension   . Migraine   . OSA (obstructive sleep apnea) 03/12/2011  . Personal history of colonic polyps   . Sepsis secondary to UTI ([place]) 03/26/2016  . Spinal stenosis     Patient Active Problem List   Diagnosis Date Noted  . Anterior shoulder dislocation, right, initial encounter 10/29/2016  . Falls 03/26/2016  . Anxiety and depression 03/26/2016  . Altered mental status 03/25/2016  . Abnormal nuclear stress test 07/05/2014  . Fatty liver 03/26/2014  . Intertrigo 03/24/2014  . Allergic rhinitis 01/02/2014  . Incontinence, feces 11/10/2013  . Musculoskeletal pain 11/08/2013    . Dysuria 04/16/2013  . Benign paroxysmal positional vertigo 07/19/2012  . Obese 09/17/2011  . Knee pain 09/17/2011  . Weight gain 06/17/2011  . DOE (dyspnea on exertion) 04/15/2011  . OSA (obstructive sleep apnea) 03/12/2011  . Insomnia 01/15/2011  . Diabetes type 2, uncontrolled ([place]) 11/27/2010  . PVC (premature ventricular contraction) 11/27/2010  . Fibromyalgia   . Depression   . GERD (gastroesophageal reflux disease)   . Hyperlipidemia   . Hypertension   . Personal history of colonic polyps   . Migraine     Past Surgical History:  Procedure Laterality Date  . APPENDECTOMY  1983  . BREAST BIOPSY Right 2007, 2009  . CARDIAC CATHETERIZATION N/A 07/05/2014   Procedure: Left Heart Cath and Coronary Angiography;  Surgeon: Sherren Mocha, MD;  Location: [facility];  Service: Cardiovascular;  Laterality: N/A;  . COLONOSCOPY  2014   normal   . ESOPHAGOGASTRODUODENOSCOPY     in [place]  . MEDIAL PARTIAL KNEE REPLACEMENT Bilateral 2006  . OVARIAN CYST REMOVAL  2008   Pt states she had a ?cyst removed ?ovaries  . POLYPECTOMY  1998     OB History   No obstetric history on file.     Family History  Problem Relation Age of Onset  . Arthritis Mother   . Hyperlipidemia Mother   . Hypertension  Mother   . Diabetes Mother   . Heart disease Mother   . Breast cancer Mother   . Lung cancer Brother   . Heart disease Brother        x 2  . Hypertension Brother   . Hyperlipidemia Brother   . Diabetes Brother   . Sudden death Father   . Lung cancer Father   . Rectal cancer Father   . Diabetes Sister   . Hyperlipidemia Sister   . Hypertension Sister   . Diabetes Maternal Aunt   . Heart attack Maternal Aunt   . Hyperlipidemia Maternal Aunt   . Hypertension Maternal Aunt   . Hypertension Maternal Uncle   . Hyperlipidemia Maternal Uncle   . Heart attack Maternal Uncle   . Diabetes Maternal Uncle   . Diabetes Paternal Aunt   . Heart attack Paternal Aunt   .  Hyperlipidemia Paternal Aunt   . Hypertension Paternal Aunt   . Hypertension Paternal Uncle   . Hyperlipidemia Paternal Uncle   . Heart attack Paternal Uncle   . Diabetes Paternal Uncle   . Prostate cancer Brother   . Stomach cancer Neg Hx   . Esophageal cancer Neg Hx     Social History   Tobacco Use  . Smoking status: Former Smoker    Types: Cigarettes  . Smokeless tobacco: Never Used  . Tobacco comment: Smoked socially.  Substance Use Topics  . Alcohol use: Yes    Alcohol/week: 6.0 - 8.0 standard drinks    Types: 6 - 8 Glasses of wine per week  . Drug use: No    Home Medications Prior to Admission medications   Medication Sig Start Date End Date Taking? Authorizing Provider  amLODipine (NORVASC) 10 MG tablet Take 1 tablet (10 mg total) by mouth daily. 04/24/17   Debbrah Alar, NP  aspirin 81 MG tablet Take 81 mg by mouth daily.      [provider]  atorvastatin (LIPITOR) 40 MG tablet Take 1 tablet (40 mg total) by mouth daily. 04/24/17   Debbrah Alar, NP  Calcium Carbonate-Vitamin D (CALCIUM-D) 600-400 MG-UNIT TABS Take 1 tablet by mouth daily.     [provider]  Coenzyme Q10 (CO Q 10) 100 MG CAPS Take 100 mg by mouth daily.    [provider]  dicyclomine (BENTYL) 20 MG tablet Take 1 tablet (20 mg total) by mouth 2 (two) times daily. 08/21/17   Palumbo, April, MD  diphenhydrAMINE (BENADRYL) 25 MG tablet Take 25 mg by mouth every 6 (six) hours as needed for itching.     [provider]  escitalopram (LEXAPRO) 20 MG tablet Take 1 tablet (20 mg total) by mouth daily. 09/19/16   Debbrah Alar, NP  liraglutide (VICTOZA) 18 MG/3ML SOPN Inject 1.8mg  once daily 03/17/17   Debbrah Alar, NP  Magnesium Oxide 400 (240 Mg) MG TABS Take 1 tablet by mouth daily. 11/08/15   Debbrah Alar, NP  meclizine (ANTIVERT) 25 MG tablet Take 1 tablet (25 mg total) by mouth 3 (three) times daily as needed for dizziness. 01/01/16    Debbrah Alar, NP  metFORMIN (GLUCOPHAGE) 1000 MG tablet Take 1 tablet (1,000 mg total) by mouth 2 (two) times daily with a meal. 02/23/17   Debbrah Alar, NP  metoprolol succinate (TOPROL-XL) 50 MG 24 hr tablet Take 1 tablet (50 mg total) by mouth daily. Take with or immediately following a meal. 04/24/17   Debbrah Alar, NP  Multiple Vitamin (MULTIVITAMIN) tablet Take 1 tablet  by mouth daily.      [provider]  Omega-3 Fatty Acids (FISH OIL) 1000 MG CAPS 2 caps by mouth twice daily Patient taking differently: Take 2,000 mg by mouth 2 (two) times daily.  04/16/11   Debbrah Alar, NP  ondansetron (ZOFRAN ODT) 8 MG disintegrating tablet 8mg  ODT q12 hours prn nausea 08/21/17   Palumbo, April, MD  oxyCODONE-acetaminophen (PERCOCET/ROXICET) 5-325 MG tablet Take 1 tablet by mouth every 8 (eight) hours as needed for severe pain. 11/26/18   Couture, Cortni S, PA-C  pantoprazole (PROTONIX) 40 MG tablet Take 1 tablet (40 mg total) by mouth at bedtime. 02/23/17   Debbrah Alar, NP  Probiotic Product (PROBIOTIC COMPLEX ACIDOPHILUS) CAPS Take 1 capsule by mouth 4 (four) times daily - after meals and at bedtime. 08/21/17   Palumbo, April, MD  venlafaxine XR (EFFEXOR-XR) 75 MG 24 hr capsule TAKE TWO CAPSULES BY MOUTH ONCE DAILY 04/24/17   Debbrah Alar, NP    Allergies    Erythromycin, Losartan, and Prednisone  Review of Systems   Review of Systems  Constitutional: Negative for chills and fever.  HENT: Negative for ear pain and sore throat.   Eyes: Negative for pain and visual disturbance.  Respiratory: Negative for cough and shortness of breath.   Cardiovascular: Positive for chest pain. Negative for palpitations.  Gastrointestinal: Positive for abdominal pain. Negative for vomiting.  Genitourinary: Negative for dysuria and hematuria.  Musculoskeletal: Positive for neck pain. Negative for arthralgias and back pain.  Skin: Negative for color change and rash.    Neurological: Negative for seizures and syncope.  All other systems reviewed and are negative.   Physical Exam Updated Vital Signs BP (!) 175/117 (BP Location: Left Arm)   Pulse 100   Temp 98 F (36.7 C) (Oral)   Resp 18   Ht 5\' 4"  (1.626 m)   Wt 79.4 kg   SpO2 100%   BMI 30.04 kg/m   Physical Exam Vitals and nursing note reviewed.  Constitutional:      General: She is not in acute distress.    Appearance: She is well-developed.  HENT:     Head: Normocephalic and atraumatic.  Eyes:     Conjunctiva/sclera: Conjunctivae normal.  Cardiovascular:     Rate and Rhythm: Normal rate and regular rhythm.     Heart sounds: No murmur.  Pulmonary:     Effort: Pulmonary effort is normal. No respiratory distress.     Breath sounds: Normal breath sounds.     Comments: Tenderness to palpation over left anterior chest, left lateral chest wall Abdominal:     Palpations: Abdomen is soft.     Tenderness: There is no abdominal tenderness.     Comments: Tenderness palpation of her left flank  Musculoskeletal:     Cervical back: Neck supple.     Comments: No tenderness in midline C-spine, there is some tenderness over left lateral neck No T or L-spine tenderness Right shoulder tenderness to palpation, range of motion limited by pain, distal sensation, cap refill intact, radial pulse intact  Skin:    General: Skin is warm and dry.     Capillary Refill: Capillary refill takes less than 2 seconds.  Neurological:     General: No focal deficit present.     Mental Status: She is alert and oriented to person, place, and time.  Psychiatric:        Mood and Affect: Mood normal.        Behavior: Behavior normal.  ED Results / Procedures / Treatments   Labs (all labs ordered are listed, but only abnormal results are displayed) Labs Reviewed - No data to display  EKG None  Radiology No results found.  Procedures Reduction of dislocation  Date/Time: 04/02/2019 3:41 PM Performed  by: Lucrezia Starch, MD Authorized by: Lucrezia Starch, MD  Consent: Verbal consent obtained. Risks and benefits: risks, benefits and alternatives were discussed Consent given by: patient Patient understanding: patient states understanding of the procedure being performed Imaging studies: imaging studies available Patient identity confirmed: verbally with patient Time out: Immediately prior to procedure a "time out" was called to verify the correct patient, procedure, equipment, support staff and site/side marked as required. Patient tolerance: patient tolerated the procedure well with no immediate complications Comments: Right shoulder anterior dislocation, performed Cunningham technique with gentle downward pressure on arm at level of elbow with concurrent shoulder and neck muscle massage; felt sudden reduction, pain improved, distal sensation, motor intact and radial pulse intact post reduction    (including critical care time)  Medications Ordered in ED Medications - No data to display  ED Course  I have reviewed the triage vital signs and the nursing notes.  Pertinent labs & imaging results that were available during my care of the patient were reviewed by me and considered in my medical decision making (see chart for details).  Clinical Course as of Apr 01 1534  Sat Apr 02, 2019  1353 Reviewed Xrs, will check Cts to further assess trauma; attempted Cunnigham technique for shoulder reduction,felt notable improvement; will repeat xr, set up for sedation if its not back in   [RD]  1527 Signed out to Dr. Ronnald Nian   [RD]    Clinical Course User Index [RD] Lucrezia Starch, MD   MDM Rules/Calculators/A&P                      74 year old lady presenting to ER after 2 falls.  Initial x-rays concerning for multiple rib fractures, right shoulder dislocation.  Successful shoulder reduction at bedside, sedation not required.  Patient has refused to follow-up with orthopedic  surgery previously.  Stressed importance of following up with them and risk of recurrent dislocations.  Placed in sling.  CT obtained order to further assess for trauma, particularly given the multi level rib fractures and the tenderness on her left flank.  Ordered incentive spirometer.  If patient has additional injuries, may require admission.  However, if no additional injuries, if patient's vital signs remained stable, does well aligned incentive spirometer and pain well controlled, may be able to go home given the rib fractures occurred around 1 week ago.  Signed out to Dr. Ronnald Nian.   Final Clinical Impression(s) / ED Diagnoses Final diagnoses:  Shoulder dislocation, right, initial encounter  Closed fracture of multiple ribs of left side, initial encounter    Rx / DC Orders ED Discharge Orders    None       Lucrezia Starch, MD 04/02/19 [id number]

## 2019-04-02 NOTE — ED Notes (Signed)
ED Provider at bedside. 

## 2019-04-02 NOTE — ED Notes (Signed)
Patient transported to CT 

## 2019-04-02 NOTE — ED Notes (Signed)
Patient returned from X-ray 

## 2019-04-02 NOTE — ED Triage Notes (Signed)
Pt states that she fell on valentines day and has left broken ribs, she states that she fell again last night and now has pain to her right shoulder  - deformity noted to the right shoulder

## 2019-04-02 NOTE — Discharge Instructions (Signed)
Please follow-up with orthopedic surgery.  Continue sling until you are seen by Ortho.  You need surgery to prevent recurrent dislocations like the one that happened today.

## 2019-04-02 NOTE — ED Provider Notes (Signed)
Patient handed off to me awaiting CT scans.  Had right shoulder dislocation that has been reduced.  Has rib fractures on chest x-ray.  Fall happened about a week ago.  CT scans unremarkable.  Patient has left seventh and eight rib fractures but otherwise unremarkable imaging.  Feels better after narcotic pain medicine.  Will prescribe narcotic pain medicine, patient has incentive spirometer.  Also appears to have a UTI and will start Keflex.  Discharged from ED in good condition.  Understands return precautions.  This chart was dictated using voice recognition software.  Despite best efforts to proofread,  errors can occur which can change the documentation meaning.     Lennice Sites, DO 04/02/19 1604

## 2019-07-13 IMAGING — DX DG RIBS 2V*L*
4 series · 4 of 4 positions shown · non-contrast
Comparison: Chest x-ray 03/25/2016.

CLINICAL DATA: Left lower rib pain.  No known injury.

EXAM:
LEFT RIBS - 2 VIEW

[rib pa (1 of 2)]
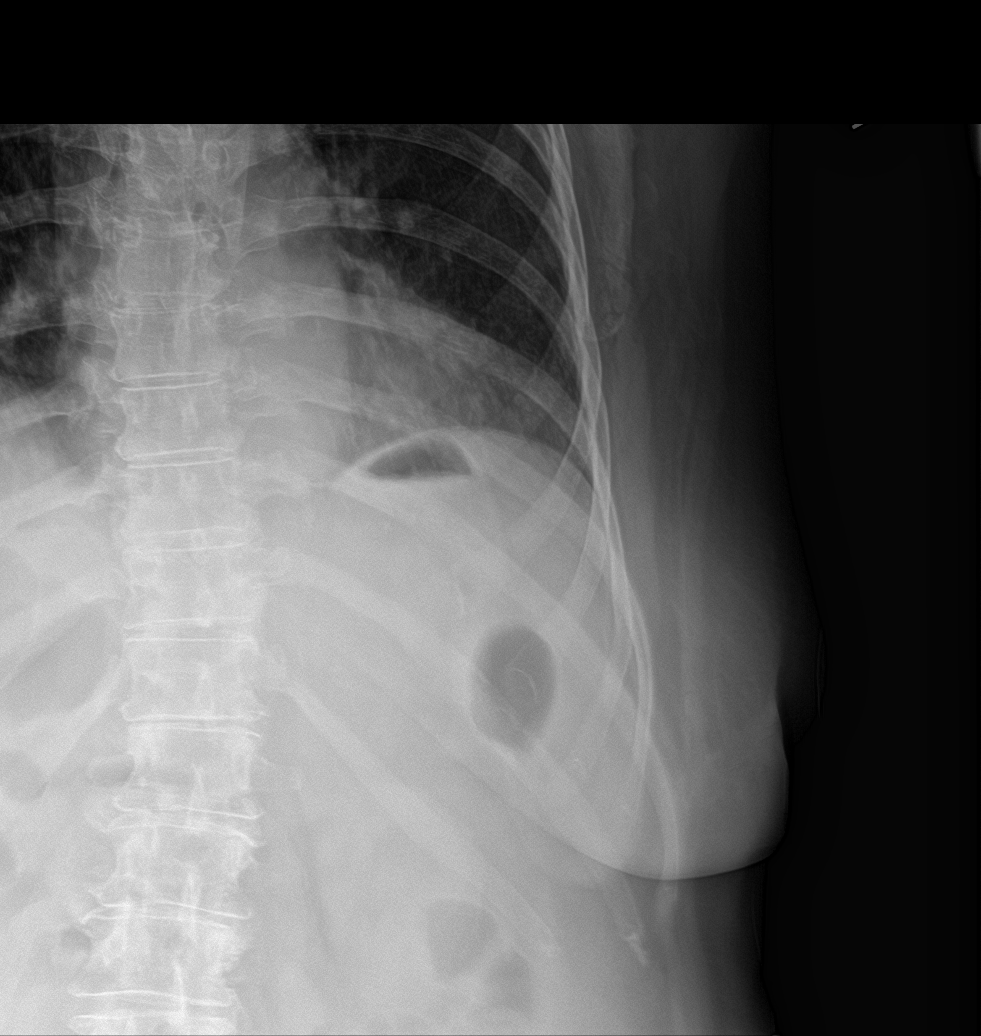

[rib pa (2 of 2)]
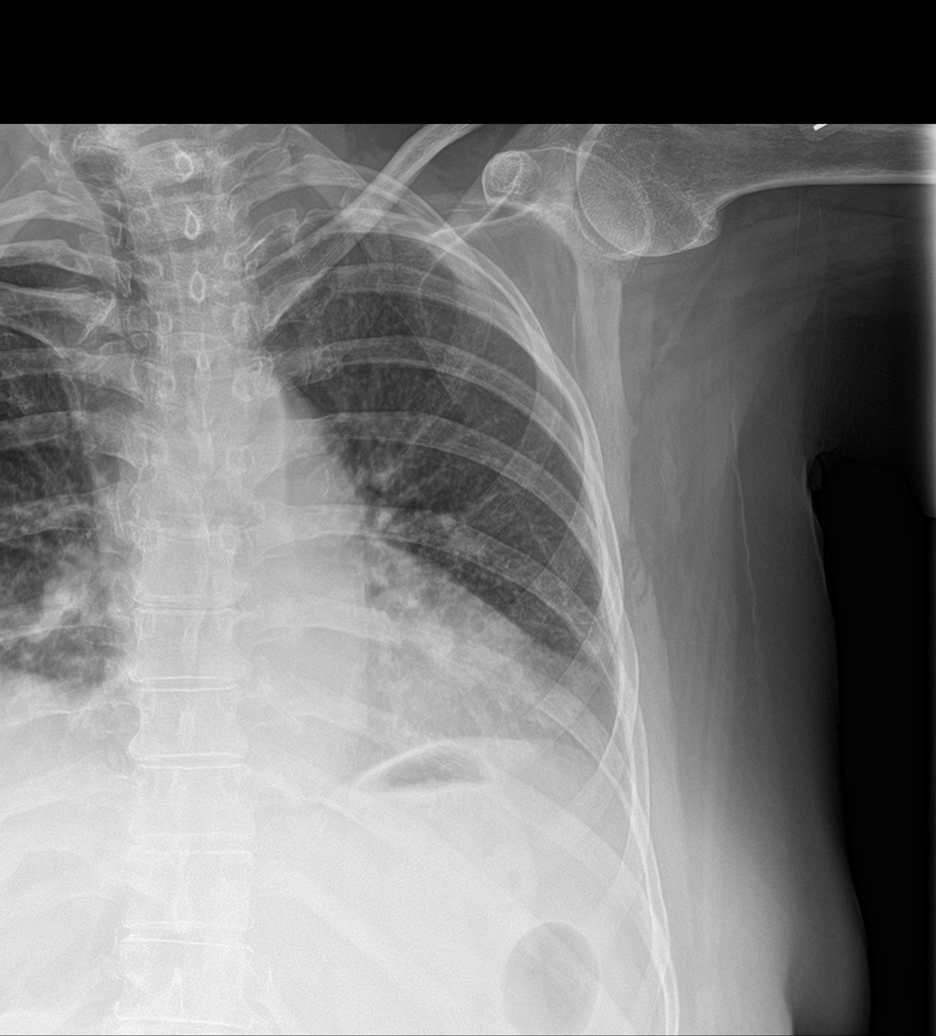

[rib obl (1 of 2)]
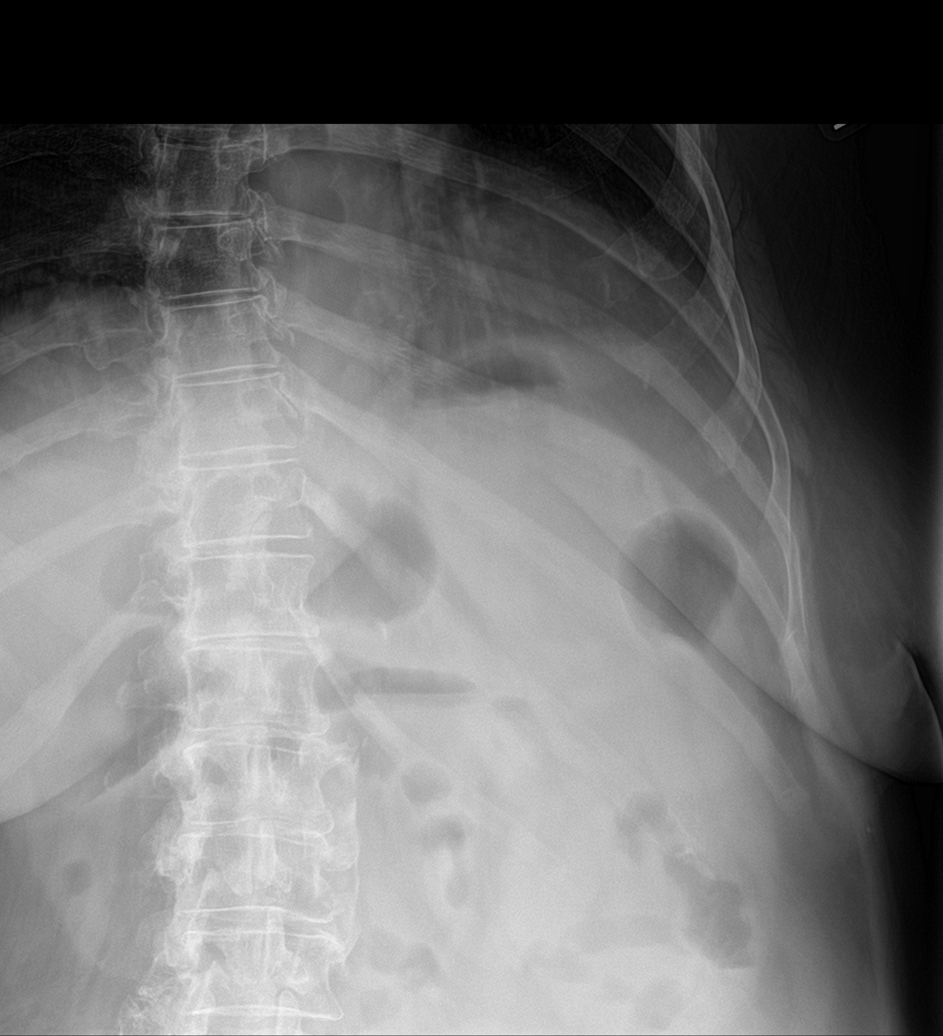

[rib obl (2 of 2)]
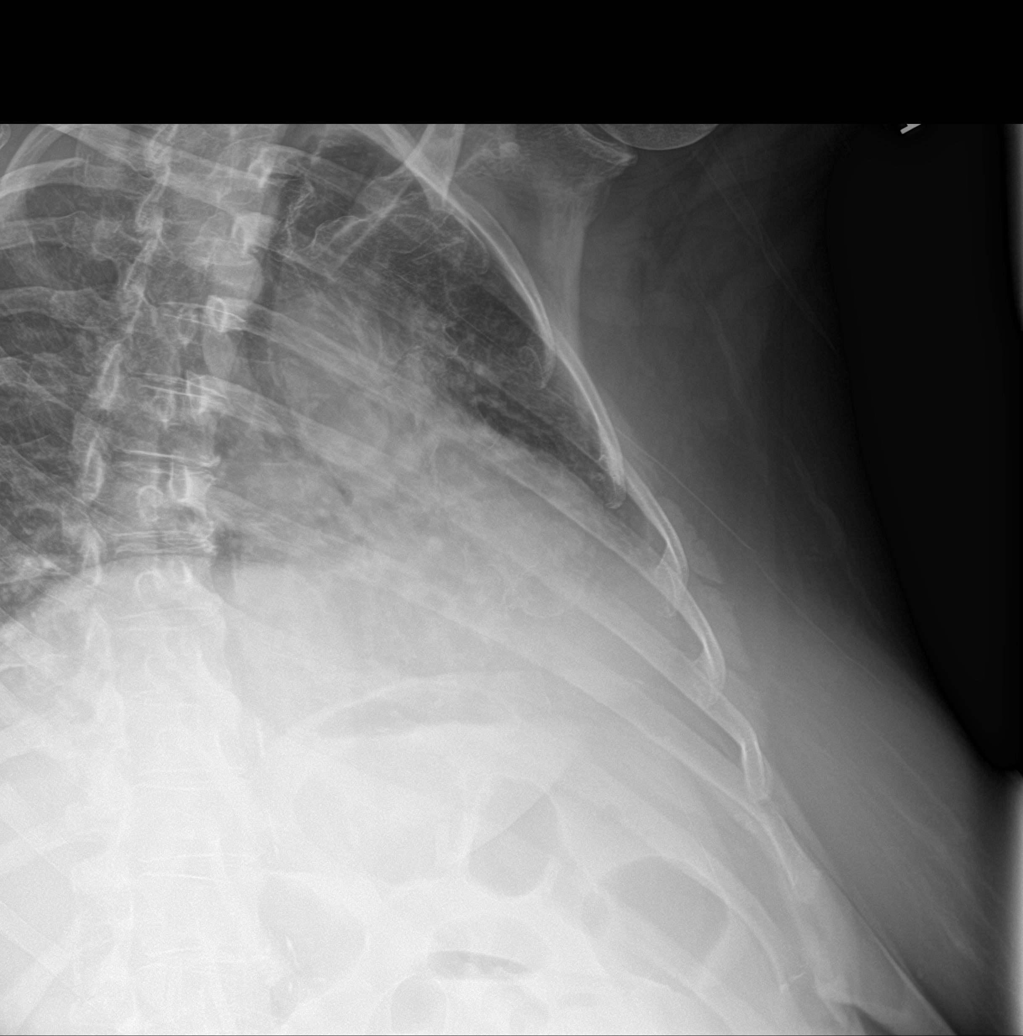

[4 of 4 positions shown; findings below may reference images not displayed]

FINDINGS: A nondisplaced left posterolateral seventh rib fracture cannot be
completely excluded. No other focal bony abnormality. Thoracolumbar
spine scoliosis. Left apical pleural thickening consistent with
scarring.
IMPRESSION: A nondisplaced left posterolateral seventh rib fracture cannot be
completely excluded. No acute or focal abnormality otherwise
identified identified. No evidence of pneumothorax.

## 2019-08-04 ENCOUNTER — Other Ambulatory Visit (HOSPITAL_BASED_OUTPATIENT_CLINIC_OR_DEPARTMENT_OTHER): Payer: Self-pay | Admitting: Family Medicine

## 2019-08-04 DIAGNOSIS — Z1231 Encounter for screening mammogram for malignant neoplasm of breast: Secondary | ICD-10-CM

## 2019-08-11 ENCOUNTER — Ambulatory Visit (HOSPITAL_BASED_OUTPATIENT_CLINIC_OR_DEPARTMENT_OTHER)
Admission: RE | Admit: 2019-08-11 | Discharge: 2019-08-11 | Disposition: A | Discharge status: Home / Self Care | Payer: Medicare Other | Source: Ambulatory Visit | Attending: Family Medicine | Admitting: Family Medicine

## 2019-08-11 ENCOUNTER — Other Ambulatory Visit: Payer: Self-pay

## 2019-08-11 DIAGNOSIS — Z1231 Encounter for screening mammogram for malignant neoplasm of breast: Secondary | ICD-10-CM | POA: Insufficient documentation

## 2019-09-22 ENCOUNTER — Emergency Department (HOSPITAL_BASED_OUTPATIENT_CLINIC_OR_DEPARTMENT_OTHER): Payer: Medicare Other

## 2019-09-22 ENCOUNTER — Other Ambulatory Visit: Payer: Self-pay

## 2019-09-22 ENCOUNTER — Emergency Department (HOSPITAL_BASED_OUTPATIENT_CLINIC_OR_DEPARTMENT_OTHER)
Admission: EM | Admit: 2019-09-22 | Discharge: 2019-09-22 | Disposition: A | Discharge status: Home / Self Care | Payer: Medicare Other | Attending: Emergency Medicine | Admitting: Emergency Medicine

## 2019-09-22 ENCOUNTER — Encounter (HOSPITAL_BASED_OUTPATIENT_CLINIC_OR_DEPARTMENT_OTHER): Payer: Self-pay

## 2019-09-22 DIAGNOSIS — S42402A Unspecified fracture of lower end of left humerus, initial encounter for closed fracture: Secondary | ICD-10-CM

## 2019-09-22 DIAGNOSIS — Y9389 Activity, other specified: Secondary | ICD-10-CM | POA: Diagnosis not present

## 2019-09-22 DIAGNOSIS — W01190A Fall on same level from slipping, tripping and stumbling with subsequent striking against furniture, initial encounter: Secondary | ICD-10-CM | POA: Insufficient documentation

## 2019-09-22 DIAGNOSIS — I10 Essential (primary) hypertension: Secondary | ICD-10-CM | POA: Diagnosis not present

## 2019-09-22 DIAGNOSIS — S52125A Nondisplaced fracture of head of left radius, initial encounter for closed fracture: Secondary | ICD-10-CM | POA: Diagnosis not present

## 2019-09-22 DIAGNOSIS — Z7984 Long term (current) use of oral hypoglycemic drugs: Secondary | ICD-10-CM | POA: Diagnosis not present

## 2019-09-22 DIAGNOSIS — E119 Type 2 diabetes mellitus without complications: Secondary | ICD-10-CM | POA: Insufficient documentation

## 2019-09-22 DIAGNOSIS — Z79899 Other long term (current) drug therapy: Secondary | ICD-10-CM | POA: Insufficient documentation

## 2019-09-22 DIAGNOSIS — Z7982 Long term (current) use of aspirin: Secondary | ICD-10-CM | POA: Diagnosis not present

## 2019-09-22 DIAGNOSIS — Y92099 Unspecified place in other non-institutional residence as the place of occurrence of the external cause: Secondary | ICD-10-CM | POA: Insufficient documentation

## 2019-09-22 DIAGNOSIS — Y998 Other external cause status: Secondary | ICD-10-CM | POA: Insufficient documentation

## 2019-09-22 DIAGNOSIS — S5002XA Contusion of left elbow, initial encounter: Secondary | ICD-10-CM | POA: Insufficient documentation

## 2019-09-22 DIAGNOSIS — S59902A Unspecified injury of left elbow, initial encounter: Secondary | ICD-10-CM | POA: Diagnosis present

## 2019-09-22 DIAGNOSIS — Z87891 Personal history of nicotine dependence: Secondary | ICD-10-CM | POA: Diagnosis not present

## 2019-09-22 MED ORDER — IBUPROFEN 400 MG PO TABS
600.0000 mg | ORAL_TABLET | Freq: Once | ORAL | Status: AC
  Administered 2019-09-22: 600 mg via ORAL
  Filled 2019-09-22: qty 1

## 2019-09-22 NOTE — ED Triage Notes (Signed)
Pt states she fell/injured left wrist ~430pm-ice pack in place upon arrival-NAD-steady gait

## 2019-09-22 NOTE — ED Notes (Signed)
Patient transported to CT 

## 2019-09-22 NOTE — ED Provider Notes (Signed)
[facility] EMERGENCY DEPARTMENT Provider Note   CSN: [id number] Arrival date & time: 09/22/19  1844     History Chief Complaint  Patient presents with  . Wrist Injury    Kimberly Lin is a 74 y.o. female presents for left arm pain after fall around 4 pm today. States she tripped over the end of the recliner while talking to her husband. [place] and caught herself with her left forearm. Pain mostly in the left dorsal forearm about 2 inches above the wrist. States she also hit her left elbow and has trouble straightening the elbow due to pain. She has minimal wrist pain and is able to move her fingers and wrist normally. She has been using ice on the arm. She denies any other injury, neck pain, from fall or precipitating symptoms prior to fall. She sees [facility] for right rotator cuff injury.     Past Medical History:  Diagnosis Date  . Abdominal mass   . Abnormal nuclear stress test 07/05/2014  . Acute encephalopathy 03/26/2016  . Anxiety   . Arthritis    osteoarthritis  . Colitis 2014  . Depression   . Diabetes mellitus   . Fibromyalgia   . GERD (gastroesophageal reflux disease)   . Hiatal hernia   . History of chicken pox   . History of septic shock 12/2009  . Hyperlipidemia   . Hypertension   . Migraine   . OSA (obstructive sleep apnea) 03/12/2011  . Personal history of colonic polyps   . Sepsis secondary to UTI ([facility]) 03/26/2016  . Spinal stenosis     Patient Active Problem List   Diagnosis Date Noted  . Anterior shoulder dislocation, right, initial encounter 10/29/2016  . Falls 03/26/2016  . Anxiety and depression 03/26/2016  . Altered mental status 03/25/2016  . Abnormal nuclear stress test 07/05/2014  . Fatty liver 03/26/2014  . Intertrigo 03/24/2014  . Allergic rhinitis 01/02/2014  . Incontinence, feces 11/10/2013  . Musculoskeletal pain 11/08/2013  . Dysuria 04/16/2013  . Benign paroxysmal positional vertigo 07/19/2012  . Obese 09/17/2011  .  Knee pain 09/17/2011  . Weight gain 06/17/2011  . DOE (dyspnea on exertion) 04/15/2011  . OSA (obstructive sleep apnea) 03/12/2011  . Insomnia 01/15/2011  . Diabetes type 2, uncontrolled ([facility]) 11/27/2010  . PVC (premature ventricular contraction) 11/27/2010  . Fibromyalgia   . Depression   . GERD (gastroesophageal reflux disease)   . Hyperlipidemia   . Hypertension   . Personal history of colonic polyps   . Migraine     Past Surgical History:  Procedure Laterality Date  . APPENDECTOMY  1983  . BREAST BIOPSY Right 2007, 2009  . CARDIAC CATHETERIZATION N/A 07/05/2014   Procedure: Left Heart Cath and Coronary Angiography;  Surgeon: Sherren Mocha, MD;  Location: [facility];  Service: Cardiovascular;  Laterality: N/A;  . COLONOSCOPY  2014   normal   . ESOPHAGOGASTRODUODENOSCOPY     in [place]  . MEDIAL PARTIAL KNEE REPLACEMENT Bilateral 2006  . OVARIAN CYST REMOVAL  2008   Pt states she had a ?cyst removed ?ovaries  . POLYPECTOMY  1998     OB History   No obstetric history on file.     Family History  Problem Relation Age of Onset  . Arthritis Mother   . Hyperlipidemia Mother   . Hypertension Mother   . Diabetes Mother   . Heart disease Mother   . Breast cancer Mother   . Lung cancer Brother   .  Heart disease Brother        x 2  . Hypertension Brother   . Hyperlipidemia Brother   . Diabetes Brother   . Sudden death Father   . Lung cancer Father   . Rectal cancer Father   . Diabetes Sister   . Hyperlipidemia Sister   . Hypertension Sister   . Diabetes Maternal Aunt   . Heart attack Maternal Aunt   . Hyperlipidemia Maternal Aunt   . Hypertension Maternal Aunt   . Hypertension Maternal Uncle   . Hyperlipidemia Maternal Uncle   . Heart attack Maternal Uncle   . Diabetes Maternal Uncle   . Diabetes Paternal Aunt   . Heart attack Paternal Aunt   . Hyperlipidemia Paternal Aunt   . Hypertension Paternal Aunt   . Hypertension Paternal Uncle   .  Hyperlipidemia Paternal Uncle   . Heart attack Paternal Uncle   . Diabetes Paternal Uncle   . Prostate cancer Brother   . Stomach cancer Neg Hx   . Esophageal cancer Neg Hx     Social History   Tobacco Use  . Smoking status: Former Smoker    Types: Cigarettes  . Smokeless tobacco: Never Used  . Tobacco comment: Smoked socially.  Vaping Use  . Vaping Use: Never used  Substance Use Topics  . Alcohol use: Yes    Alcohol/week: 6.0 - 8.0 standard drinks    Types: 6 - 8 Glasses of wine per week  . Drug use: No    Home Medications Prior to Admission medications   Medication Sig Start Date End Date Taking? Authorizing Provider  amLODipine (NORVASC) 10 MG tablet Take 1 tablet (10 mg total) by mouth daily. 04/24/17   Debbrah Alar, NP  aspirin 81 MG tablet Take 81 mg by mouth daily.      [provider]  atorvastatin (LIPITOR) 40 MG tablet Take 1 tablet (40 mg total) by mouth daily. 04/24/17   Debbrah Alar, NP  Calcium Carbonate-Vitamin D (CALCIUM-D) 600-400 MG-UNIT TABS Take 1 tablet by mouth daily.     [provider]  Coenzyme Q10 (CO Q 10) 100 MG CAPS Take 100 mg by mouth daily.    [provider]  dicyclomine (BENTYL) 20 MG tablet Take 1 tablet (20 mg total) by mouth 2 (two) times daily. 08/21/17   Palumbo, April, MD  diphenhydrAMINE (BENADRYL) 25 MG tablet Take 25 mg by mouth every 6 (six) hours as needed for itching.     [provider]  escitalopram (LEXAPRO) 20 MG tablet Take 1 tablet (20 mg total) by mouth daily. 09/19/16   Debbrah Alar, NP  liraglutide (VICTOZA) 18 MG/3ML SOPN Inject 1.8mg  once daily 03/17/17   Debbrah Alar, NP  Magnesium Oxide 400 (240 Mg) MG TABS Take 1 tablet by mouth daily. 11/08/15   Debbrah Alar, NP  meclizine (ANTIVERT) 25 MG tablet Take 1 tablet (25 mg total) by mouth 3 (three) times daily as needed for dizziness. 01/01/16   Debbrah Alar, NP  metFORMIN (GLUCOPHAGE) 1000 MG tablet Take  1 tablet (1,000 mg total) by mouth 2 (two) times daily with a meal. 02/23/17   Debbrah Alar, NP  metoprolol succinate (TOPROL-XL) 50 MG 24 hr tablet Take 1 tablet (50 mg total) by mouth daily. Take with or immediately following a meal. 04/24/17   Debbrah Alar, NP  Multiple Vitamin (MULTIVITAMIN) tablet Take 1 tablet by mouth daily.      [provider]  Omega-3 Fatty Acids (FISH OIL) 1000  MG CAPS 2 caps by mouth twice daily Patient taking differently: Take 2,000 mg by mouth 2 (two) times daily.  04/16/11   Debbrah Alar, NP  ondansetron (ZOFRAN ODT) 8 MG disintegrating tablet 8mg  ODT q12 hours prn nausea 08/21/17   Palumbo, April, MD  oxyCODONE (ROXICODONE) 5 MG immediate release tablet Take 1 tablet (5 mg total) by mouth every 6 (six) hours as needed for up to 15 doses for severe pain or breakthrough pain. 04/02/19   Curatolo, Adam, DO  oxyCODONE-acetaminophen (PERCOCET/ROXICET) 5-325 MG tablet Take 1 tablet by mouth every 8 (eight) hours as needed for severe pain. 11/26/18   Couture, Cortni S, PA-C  pantoprazole (PROTONIX) 40 MG tablet Take 1 tablet (40 mg total) by mouth at bedtime. 02/23/17   Debbrah Alar, NP  Probiotic Product (PROBIOTIC COMPLEX ACIDOPHILUS) CAPS Take 1 capsule by mouth 4 (four) times daily - after meals and at bedtime. 08/21/17   Palumbo, April, MD  venlafaxine XR (EFFEXOR-XR) 75 MG 24 hr capsule TAKE TWO CAPSULES BY MOUTH ONCE DAILY 04/24/17   Debbrah Alar, NP    Allergies    Erythromycin, Losartan, and Prednisone  Review of Systems   Review of Systems  Constitutional: Negative for chills and fatigue.  HENT: Negative for congestion and sinus pain.   Eyes: Negative for discharge and redness.  Respiratory: Negative for cough, chest tightness and shortness of breath.   Cardiovascular: Negative for chest pain and palpitations.  Gastrointestinal: Negative for abdominal distention, constipation, nausea and vomiting.  Endocrine: Negative for  polydipsia and polyphagia.  Genitourinary: Negative for dysuria and flank pain.  Musculoskeletal: Negative for back pain, joint swelling and neck stiffness.  Skin: Negative for pallor and wound.  All other systems reviewed and are negative.   Physical Exam Updated Vital Signs BP (!) 158/85   Pulse 79   Temp 99.3 F (37.4 C) (Oral)   Resp 18   Ht 5\' 3"  (1.6 m)   Wt 74.4 kg   SpO2 99%   BMI 29.05 kg/m   Physical Exam Constitutional:      Appearance: Normal appearance. She is normal weight.  HENT:     Head: Normocephalic and atraumatic.     Right Ear: External ear normal.     Left Ear: External ear normal.     Nose: Nose normal.     Mouth/Throat:     Mouth: Mucous membranes are moist.     Pharynx: Oropharynx is clear.  Eyes:     Extraocular Movements: Extraocular movements intact.     Conjunctiva/sclera: Conjunctivae normal.     Pupils: Pupils are equal, round, and reactive to light.  Cardiovascular:     Rate and Rhythm: Normal rate and regular rhythm.     Pulses: Normal pulses.     Heart sounds: Normal heart sounds.  Pulmonary:     Effort: Pulmonary effort is normal.     Breath sounds: Normal breath sounds.  Abdominal:     General: Abdomen is flat. Bowel sounds are normal.     Palpations: Abdomen is soft.  Musculoskeletal:        General: No swelling or deformity.     Cervical back: Normal range of motion and neck supple.     Comments: Pain to palpation of left dorsal forearm with scattered bruising. Small 1 cm abrasion of left elbow with surrounding bruising. Radial pulse 2+ symmetric, symmetric sensation. ROM of elbow limited by pain  Skin:    General: Skin is dry.  Capillary Refill: Capillary refill takes less than 2 seconds.  Neurological:     General: No focal deficit present.     Mental Status: She is alert and oriented to person, place, and time. Mental status is at baseline.  Psychiatric:        Mood and Affect: Mood normal.        Behavior: Behavior  normal.     ED Results / Procedures / Treatments   Labs (all labs ordered are listed, but only abnormal results are displayed) Labs Reviewed - No data to display  EKG None  Radiology DG Forearm Left  Result Date: 09/22/2019 CLINICAL DATA:  Fall with wrist injury EXAM: LEFT FOREARM - 2 VIEW COMPARISON:  09/22/2019 FINDINGS: Positive for elbow effusion. No dislocation. Questionable subtle step-off deformity at the radial head neck junction. IMPRESSION: Positive for elbow effusion. Possible subtle nondisplaced fracture at the radial head neck junction. Consider dedicated views of the elbow. Electronically Signed   By: Donavan Foil M.D.   On: 09/22/2019 21:33   DG Wrist Complete Left  Result Date: 09/22/2019 CLINICAL DATA:  Fall, left wrist pain EXAM: LEFT WRIST - COMPLETE 3+ VIEW COMPARISON:  None. FINDINGS: Four view radiograph of the left wrist demonstrates normal alignment. No fracture or dislocation. Mild degenerative arthritis of the first [facility] joint at the base of the thumb. Remaining joint spaces are preserved. Soft tissues are unremarkable. IMPRESSION: Mild osteoarthritis of the first [facility] joint. No acute fracture. Electronically Signed   By: Fidela Salisbury MD   On: 09/22/2019 19:23    Procedures .Ortho Injury Treatment  Date/Time: 09/22/2019 9:46 PM Performed by: Iona Beard, MD Authorized by: Drenda Freeze, MD   Consent:    Consent obtained:  Verbal   Consent given by:  Patient   Risks discussed:  Restricted joint movement and stiffness   Alternatives discussed:  No treatmentInjury location: elbow Location details: left elbow Injury type: fracture Fracture type: radial head Pre-procedure neurovascular assessment: neurovascularly intact Pre-procedure distal perfusion: normal Pre-procedure neurological function: normal Pre-procedure range of motion: reduced  Anesthesia: Local anesthesia used: no  Patient sedated: NoImmobilization: splint and sling     (including critical care time)  Medications Ordered in ED Medications  ibuprofen (ADVIL) tablet 600 mg (600 mg Oral Given 09/22/19 2141)    ED Course  I have reviewed the triage vital signs and the nursing notes.  Pertinent labs & imaging results that were available during my care of the patient were reviewed by me and considered in my medical decision making (see chart for details).    MDM Rules/Calculators/A&P                          74 year old with left arm pain secondary to mechanical fall. Stable vitals left arm is neurovascularly intact. Xray of forearm showing small nondisplaced fracture at the radial head neck junction. Placed left arm in posterior arm splint with sling. She follows with [facility]. Discharged patient and instructed patient to follow up with her ortho for fracture.  Final Clinical Impression(s) / ED Diagnoses Final diagnoses:  Closed fracture of left elbow, initial encounter    Rx / DC Orders ED Discharge Orders    None       Iona Beard, MD 09/22/19 2240    Drenda Freeze, MD 09/23/19 2118

## 2019-09-22 NOTE — Discharge Instructions (Signed)
Your Xray shows a small fracture of the left elbow. We have placed your elbow in a splint and you should follow up with your Orthopedic doctor. You can consider pain medication as needed for pain.

## 2019-10-04 DIAGNOSIS — M75120 Complete rotator cuff tear or rupture of unspecified shoulder, not specified as traumatic: Secondary | ICD-10-CM | POA: Insufficient documentation

## 2019-11-18 ENCOUNTER — Ambulatory Visit: Payer: Self-pay | Admitting: General Surgery

## 2019-11-18 NOTE — H&P (Signed)
The patient is a 74 year old female who presents with rectal prolapse. 74 year old female who presents to the office with complaints of rectal pain, pressure and burning. She reports prolapsing tissue with bowel movements and with urination. This has to be manually reduced. She denies any rectal bleeding. Last colonoscopy was in 2019. She has a history of lymphocytic colitis and takes medication for this. She has chronic pain due to a shoulder injury and takes oxycodone twice a day. Surgical history consistent for appendectomy and ovarian cystectomy. She has frequent UTIs as well. She reports fecal incontinence after bowel movements and wears a pad on a daily basis.   Past Surgical History Mammie Lorenzo, LPN; 93/03/3555 3:22 PM) Appendectomy Breast Biopsy Right. multiple Colon Polyp Removal - Colonoscopy Colon Polyp Removal - Open Knee Surgery Bilateral.  Diagnostic Studies History Mammie Lorenzo, LPN; 03/17/4268 6:23 PM) Colonoscopy 1-5 years ago Mammogram 1-3 years ago Pap Smear 1-5 years ago  Allergies Mammie Lorenzo, LPN; 76/03/8313 1:76 PM) Erythromycin Derivatives Swelling. Losartan Potassium *ANTIHYPERTENSIVES* Cough. predniSONE *CORTICOSTEROIDS* Allergies Reconciled  Medication History Mammie Lorenzo, LPN; 16/0/7371 0:62 PM) oxyCODONE-Acetaminophen (5-325MG  Tablet, Oral) Active. clonazePAM (0.5MG  Tablet, Oral) Active. Metoprolol Succinate ER (50MG  Tablet ER 24HR, Oral) Active. Ondansetron (4MG  Tablet Disint, Oral) Active. Norvasc (10MG  Tablet, Oral) Active. Aspirin (81MG  Tablet DR, Oral) Active. Lipitor (40MG  Tablet, Oral) Active. Calcium Carbonate (600MG  Tablet, Oral) Active. Co Q 10 (100MG  Capsule, Oral) Active. Bentyl (20MG  Capsule, Oral) Active. Benadryl (25MG  Tablet, Oral as needed) Active. Lexapro (20MG  Tablet, Oral) Active. Victoza (18MG /3ML Solution, Subcutaneous) Active. Meclizine HCl (25MG  Tablet, Oral as needed)  Active. metFORMIN HCl (1000MG  Tablet, Oral) Active. Multivitamin (Oral) Active. Fish Oil (1000MG  Capsule, Oral) Active. Zofran ODT (8MG  Tablet Disint, Oral) Active. Roxicodone (5MG  Tablet, Oral) Active. Protonix (40MG  Tablet DR, Oral) Active. Probiotic (Oral) Active. Effexor (75MG  Tablet, Oral) Active. Medications Reconciled  Social History Mammie Lorenzo, LPN; 69/05/8544 2:70 PM) Alcohol use Occasional alcohol use. Caffeine use Coffee. Tobacco use Never smoker.  Family History Mammie Lorenzo, LPN; 35/0/0938 1:82 PM) Alcohol Abuse Father. Arthritis Brother, Mother, Sister. Breast Cancer Mother, Sister. Diabetes Mellitus Brother, Father, Mother, Sister. Heart disease in female family member before age 1 Hypertension Brother, Father, Mother, Sister. Rectal Cancer Father. Respiratory Condition Brother, Mother.  Pregnancy / Birth History Mammie Lorenzo, LPN; 99/04/7167 6:78 PM) Age at menarche 8 years. Age of menopause 60-50 Gravida 3 Maternal age 68-20 Para 2  Other Problems Mammie Lorenzo, LPN; 93/09/1015 5:10 PM) Anxiety Disorder Arthritis Back Pain Diabetes Mellitus Gastroesophageal Reflux Disease Heart murmur High blood pressure     Review of Systems Claiborne Billings Dockery LPN; 25/09/5275 8:24 PM) General Not Present- Appetite Loss, Chills, Fatigue, Fever, Night Sweats, Weight Gain and Weight Loss. Skin Not Present- Change in Wart/Mole, Dryness, Hives, Jaundice, New Lesions, Non-Healing Wounds, Rash and Ulcer. HEENT Not Present- Earache, Hearing Loss, Hoarseness, Nose Bleed, Oral Ulcers, Ringing in the Ears, Seasonal Allergies, Sinus Pain, Sore Throat, Visual Disturbances, Wears glasses/contact lenses and Yellow Eyes. Respiratory Not Present- Bloody sputum, Chronic Cough, Difficulty Breathing, Snoring and Wheezing. Breast Not Present- Breast Mass, Breast Pain, Nipple Discharge and Skin Changes. Cardiovascular Not Present- Chest Pain, Difficulty  Breathing Lying Down, Leg Cramps, Palpitations, Rapid Heart Rate, Shortness of Breath and Swelling of Extremities. Gastrointestinal Present- Hemorrhoids and Rectal Pain. Not Present- Abdominal Pain, Bloating, Bloody Stool, Change in Bowel Habits, Chronic diarrhea, Constipation, Difficulty Swallowing, Excessive gas, Gets full quickly at meals, Indigestion, Nausea and Vomiting. Female Genitourinary Present- Painful Urination. Not Present- Frequency, Nocturia,  Pelvic Pain and Urgency. Musculoskeletal Present- Back Pain, Joint Pain and Joint Stiffness. Not Present- Muscle Pain, Muscle Weakness and Swelling of Extremities. Psychiatric Not Present- Anxiety, Bipolar, Change in Sleep Pattern, Depression, Fearful and Frequent crying. Endocrine Not Present- Cold Intolerance, Excessive Hunger, Hair Changes, Heat Intolerance, Hot flashes and New Diabetes. Hematology Not Present- Blood Thinners, Easy Bruising, Excessive bleeding, Gland problems, HIV and Persistent Infections.  Vitals Claiborne Billings Dockery LPN; 16/02/958 4:54 PM) 11/18/2019 3:02 PM Weight: 159.6 lb Height: 63.5in Body Surface Area: 1.77 m Body Mass Index: 27.83 kg/m  Temp.: 98.82F(Infrared)  Pulse: 86 (Regular)  BP: 122/72(Sitting, Left Arm, Standard)        Physical Exam Leighton Ruff MD; 10/18/1189 3:26 PM)  General Mental Status-Alert. General Appearance-Cooperative.  Rectal Anorectal Exam External - Note: Circumferential rectal mucosal prolapse noted on Valsalva. Internal - decreased sphincter tone.    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 On physical exam, she was found to have rectal prolapse, which was incomplete and easily reducible. She reports a colonoscopy in the recent past. We will see when this has been done and obtain her medical records. We will get cardiac clearance  prior to surgery. I have recommended a robotic-assisted rectopexy without resection. Due to her incontinence and no major signs of constipation. We did discussed that rectopexy alone may make her more constipated. We also discussed that her incontinence may worsen after surgery, but this does tend to improve with time. The surgery and anatomy were described to the patient as well as the risks of surgery and the possible complications. These include: Bleeding, deep abdominal infections and possible wound complications such as hernia and infection, damage to adjacent structures, leak of surgical connections, which can lead to other surgeries and possibly an ostomy, possible need for other procedures, such as abscess drains in radiology, possible prolonged hospital stay, possible diarrhea from removal of part of the colon, possible constipation from narcotics, possible bowel, bladder or sexual dysfunction if having rectal surgery, prolonged fatigue/weakness or appetite loss, possible early recurrence of of disease, possible complications of their medical problems such as heart disease or arrhythmias or lung problems, death (less than 1%). I believe the patient understands and wishes to proceed with the surgery.

## 2019-12-19 NOTE — Patient Instructions (Addendum)
DUE TO COVID-19 ONLY ONE VISITOR IS ALLOWED TO COME WITH YOU AND STAY IN THE WAITING ROOM ONLY DURING PRE OP AND PROCEDURE.   IF YOU WILL BE ADMITTED INTO THE HOSPITAL YOU ARE ALLOWED ONE SUPPORT PERSON DURING VISITATION HOURS ONLY (10AM -8PM)   . The support person may change daily. . The support person must pass our screening, gel in and out, and wear a mask at all times, including in the patient's room. . Patients must also wear a mask when staff or their support person are in the room.   COVID SWAB TESTING MUST BE COMPLETED ON:   Tuesday, 12-27-19 @ 11:30   [address] [place], [place] [address]  (Must self quarantine after testing. Follow instructions on handout.)        Your procedure is scheduled on:  Friday, 12-30-19   Report to [facility] Main  Entrance   Report to admitting at 10:45 AM   Call this number if you have problems the morning of surgery [phone]    Follow a clear liquid diet day of prep to prevent dehydration.   Do not eat food :After Midnight.   May have liquids until 9:45 AM day of surgery  CLEAR LIQUID DIET  Foods Allowed                                                                     Foods Excluded  Water, Black Coffee and tea, regular and decaf              liquids that you cannot  Plain Jell-O in any flavor  (No red)                                     see through such as: Fruit ices (not with fruit pulp)                                      milk, soups, orange juice              Iced Popsicles (No red)                                      All solid food                                   Apple juices Sports drinks like Gatorade (No red) Lightly seasoned clear broth or consume(fat free) Sugar, honey syrup  Sample Menu Breakfast                                Lunch                                     Supper Cranberry juice  Beef broth                            Chicken broth Jell-O                                      Grape juice                           Apple juice Coffee or tea                        Jell-O                                      Popsicle                                                Coffee or tea                        Coffee or tea      Drink 2 G2 drinks the night before surgery.     Complete one G2 drink the morning of surgery 3 hours prior to scheduled surgery at 9:45 AM.   Oral Hygiene is also important to reduce your risk of infection.                                    Remember - BRUSH YOUR TEETH THE MORNING OF SURGERY WITH YOUR REGULAR TOOTHPASTE   Do NOT smoke after Midnight   Take these medicines the morning of surgery with A SIP OF WATER:  Amlodipine, Lexapro, Metoprolol, Effexor  How to Manage Your Diabetes Before and After Surgery  Why is it important to control my blood sugar before and after surgery? . Improving blood sugar levels before and after surgery helps healing and can limit problems. . A way of improving blood sugar control is eating a healthy diet by: o  Eating less sugar and carbohydrates o  Increasing activity/exercise o  Talking with your doctor about reaching your blood sugar goals . High blood sugars (greater than 180 mg/dL) can raise your risk of infections and slow your recovery, so you will need to focus on controlling your diabetes during the weeks before surgery. . Make sure that the doctor who takes care of your diabetes knows about your planned surgery including the date and location.  How do I manage my blood sugar before surgery? . Check your blood sugar at least 4 times a day, starting 2 days before surgery, to make sure that the level is not too high or low. o Check your blood sugar the morning of your surgery when you wake up and every 2 hours until you get to the Short Stay unit. . If your blood sugar is less than 70 mg/dL, you will need to treat for low blood sugar: o Do not take insulin. o Treat a low blood sugar (less than 70  mg/dL) with  cup of clear juice (cranberry or apple),  4 glucose tablets, OR glucose gel. o Recheck blood sugar in 15 minutes after treatment (to make sure it is greater than 70 mg/dL). If your blood sugar is not greater than 70 mg/dL on recheck, call [phone] for further instructions. . Report your blood sugar to the short stay nurse when you get to Short Stay.  . If you are admitted to the hospital after surgery: o Your blood sugar will be checked by the staff and you will probably be given insulin after surgery (instead of oral diabetes medicines) to make sure you have good blood sugar levels. o The goal for blood sugar control after surgery is 80-180 mg/dL.   WHAT DO I DO ABOUT MY DIABETES MEDICATION?  Marland Kitchen Do not take oral diabetes medicines (pills) the morning of surgery.  . THE DAY BEFORE SURGERY:  Take Metformin as  prescribed.                   . THE MORNING OF SURGERY:  Do not take Metformin or Victoza.   Reviewed and Endorsed by [facility] Patient Education Committee, August 2015                               You may not have any metal on your body including hair pins, jewelry, and body piercings             Do not wear make-up, lotions, powders, perfumes/cologne, or deodorant             Do not wear nail polish.  Do not shave  48 hours prior to surgery.         Do not bring valuables to the hospital. [place].   Contacts, dentures or bridgework may not be worn into surgery.   Bring small overnight bag day of surgery.     Special Instructions: Bring a copy of your healthcare power of attorney and living will documents         the day of surgery if you haven't scanned them in before.              Please read over the following fact sheets you were given: IF YOU HAVE QUESTIONS ABOUT YOUR PRE OP INSTRUCTIONS PLEASE CALL [phone]   [place] - Preparing for Surgery Before surgery, you can play an important role.  Because skin  is not sterile, your skin needs to be as free of germs as possible.  You can reduce the number of germs on your skin by washing with CHG (chlorahexidine gluconate) soap before surgery.  CHG is an antiseptic cleaner which kills germs and bonds with the skin to continue killing germs even after washing. Please DO NOT use if you have an allergy to CHG or antibacterial soaps.  If your skin becomes reddened/irritated stop using the CHG and inform your nurse when you arrive at Short Stay. Do not shave (including legs and underarms) for at least 48 hours prior to the first CHG shower.  You may shave your face/neck.  Please follow these instructions carefully:  1.  Shower with CHG Soap the night before surgery and the  morning of surgery.  2.  If you choose to wash your hair, wash your hair first as usual with your normal  shampoo.  3.  After you shampoo, rinse your hair and body thoroughly to remove the shampoo.  4.  Use CHG as you would any other liquid soap.  You can apply chg directly to the skin and wash.  Gently with a scrungie or clean washcloth.  5.  Apply the CHG Soap to your body ONLY FROM THE NECK DOWN.   Do   not use on face/ open                           Wound or open sores. Avoid contact with eyes, ears mouth and   genitals (private parts).                       Wash face,  Genitals (private parts) with your normal soap.             6.  Wash thoroughly, paying special attention to the area where your    surgery  will be performed.  7.  Thoroughly rinse your body with warm water from the neck down.  8.  DO NOT shower/wash with your normal soap after using and rinsing off the CHG Soap.                9.  Pat yourself dry with a clean towel.            10.  Wear clean pajamas.            11.  Place clean sheets on your bed the night of your first shower and do not  sleep with pets. Day of Surgery : Do not apply any lotions/deodorants the morning of surgery.  Please  wear clean clothes to the hospital/surgery center.  FAILURE TO FOLLOW THESE INSTRUCTIONS MAY RESULT IN THE CANCELLATION OF YOUR SURGERY  PATIENT SIGNATURE_________________________________  NURSE SIGNATURE__________________________________  ________________________________________________________________________   Adam Phenix  An incentive spirometer is a tool that can help keep your lungs clear and active. This tool measures how well you are filling your lungs with each breath. Taking long deep breaths may help reverse or decrease the chance of developing breathing (pulmonary) problems (especially infection) following:  A long period of time when you are unable to move or be active. BEFORE THE PROCEDURE   If the spirometer includes an indicator to show your best effort, your nurse or respiratory therapist will set it to a desired goal.  If possible, sit up straight or lean slightly forward. Try not to slouch.  Hold the incentive spirometer in an upright position. INSTRUCTIONS FOR USE  1. Sit on the edge of your bed if possible, or sit up as far as you can in bed or on a chair. 2. Hold the incentive spirometer in an upright position. 3. Breathe out normally. 4. Place the mouthpiece in your mouth and seal your lips tightly around it. 5. Breathe in slowly and as deeply as possible, raising the piston or the ball toward the top of the column. 6. Hold your breath for 3-5 seconds or for as long as possible. Allow the piston or ball to fall to the bottom of the column. 7. Remove the mouthpiece from your mouth and breathe out normally. 8. Rest for a few seconds and repeat Steps 1 through 7 at least 10 times every 1-2 hours when you are awake. Take your time and take a few normal breaths between deep breaths. 9. The spirometer may include an indicator to show your best effort. Use the indicator as a goal to work toward during each repetition.  10. After each set of 10 deep breaths,  practice coughing to be sure your lungs are clear. If you have an incision (the cut made at the time of surgery), support your incision when coughing by placing a pillow or rolled up towels firmly against it. Once you are able to get out of bed, walk around indoors and cough well. You may stop using the incentive spirometer when instructed by your caregiver.  RISKS AND COMPLICATIONS  Take your time so you do not get dizzy or light-headed.  If you are in pain, you may need to take or ask for pain medication before doing incentive spirometry. It is harder to take a deep breath if you are having pain. AFTER USE  Rest and breathe slowly and easily.  It can be helpful to keep track of a log of your progress. Your caregiver can provide you with a simple table to help with this. If you are using the spirometer at home, follow these instructions: [place] IF:   You are having difficultly using the spirometer.  You have trouble using the spirometer as often as instructed.  Your pain medication is not giving enough relief while using the spirometer.  You develop fever of 100.5 F (38.1 C) or higher. SEEK IMMEDIATE MEDICAL CARE IF:   You cough up bloody sputum that had not been present before.  You develop fever of 102 F (38.9 C) or greater.  You develop worsening pain at or near the incision site. MAKE SURE YOU:   Understand these instructions.  Will watch your condition.  Will get help right away if you are not doing well or get worse. Document Released: 06/09/2006 Document Revised: 04/21/2011 Document Reviewed: 08/10/2006 ExitCare Patient Information 2014 Exit[employer], [place].   ________________________________________________________________________  WHAT IS A BLOOD TRANSFUSION? Blood Transfusion Information  A transfusion is the replacement of blood or some of its parts. Blood is made up of multiple cells which provide different functions.  Red blood cells carry oxygen  and are used for blood loss replacement.  White blood cells fight against infection.  Platelets control bleeding.  Plasma helps clot blood.  Other blood products are available for specialized needs, such as hemophilia or other clotting disorders. BEFORE THE TRANSFUSION  Who gives blood for transfusions?   Healthy volunteers who are fully evaluated to make sure their blood is safe. This is blood bank blood. Transfusion therapy is the safest it has ever been in the practice of medicine. Before blood is taken from a donor, a complete history is taken to make sure that person has no history of diseases nor engages in risky social behavior (examples are intravenous drug use or sexual activity with multiple partners). The donor's travel history is screened to minimize risk of transmitting infections, such as malaria. The donated blood is tested for signs of infectious diseases, such as HIV and hepatitis. The blood is then tested to be sure it is compatible with you in order to minimize the chance of a transfusion reaction. If you or a relative donates blood, this is often done in anticipation of surgery and is not appropriate for emergency situations. It takes many days to process the donated blood. RISKS AND COMPLICATIONS Although transfusion therapy is very safe and saves many lives, the main dangers of transfusion include:   Getting an infectious disease.  Developing a transfusion reaction. This is an allergic reaction to something in the blood you were given. Every precaution is taken to prevent this. The  decision to have a blood transfusion has been considered carefully by your caregiver before blood is given. Blood is not given unless the benefits outweigh the risks. AFTER THE TRANSFUSION  Right after receiving a blood transfusion, you will usually feel much better and more energetic. This is especially true if your red blood cells have gotten low (anemic). The transfusion raises the level of  the red blood cells which carry oxygen, and this usually causes an energy increase.  The nurse administering the transfusion will monitor you carefully for complications. HOME CARE INSTRUCTIONS  No special instructions are needed after a transfusion. You may find your energy is better. Speak with your caregiver about any limitations on activity for underlying diseases you may have. SEEK MEDICAL CARE IF:   Your condition is not improving after your transfusion.  You develop redness or irritation at the intravenous (IV) site. SEEK IMMEDIATE MEDICAL CARE IF:  Any of the following symptoms occur over the next 12 hours:  Shaking chills.  You have a temperature by mouth above 102 F (38.9 C), not controlled by medicine.  Chest, back, or muscle pain.  People around you feel you are not acting correctly or are confused.  Shortness of breath or difficulty breathing.  Dizziness and fainting.  You get a rash or develop hives.  You have a decrease in urine output.  Your urine turns a dark color or changes to pink, red, or brown. Any of the following symptoms occur over the next 10 days:  You have a temperature by mouth above 102 F (38.9 C), not controlled by medicine.  Shortness of breath.  Weakness after normal activity.  The white part of the eye turns yellow (jaundice).  You have a decrease in the amount of urine or are urinating less often.  Your urine turns a dark color or changes to pink, red, or brown. Document Released: 01/25/2000 Document Revised: 04/21/2011 Document Reviewed: 09/13/2007 [facility] Patient Information 2014 [place], [place].  _______________________________________________________________________

## 2019-12-19 NOTE — Progress Notes (Addendum)
COVID Vaccine Completed:  x2 Date COVID Vaccine completed:  04-05-19 04-19-19 12-08-19 COVID vaccine manufacturer: [employer]   PCP - Kellie Shropshire, MD Cardiologist - [facility].  Last office note on chart  Chest x-ray -  EKG - 12-20-19 in Epic Stress Test - 05-25-14 in Epic ECHO - 08-30-19 at [facility].  Report on chart Cardiac Cath - 07-05-14 in Epic Pacemaker/ICD device last checked:  Sleep Study - 06-14-16 CPAP - No, stopped 2 years ago  Fasting Blood Sugar - 110-120 Checks Blood Sugar - once a month  Blood Thinner Instructions: Aspirin Instructions:  ASA 81 mg Last Dose:  Stopped 2 weeks ago  Anesthesia review: Abnormal stress test.  HTN, OSA. DM  Patient denies shortness of breath, fever, cough and chest pain at PAT appointment   Patient verbalized understanding of instructions that were given to them at the PAT appointment. Patient was also instructed that they will need to review over the PAT instructions again at home before surgery.

## 2019-12-20 ENCOUNTER — Other Ambulatory Visit: Payer: Self-pay

## 2019-12-20 ENCOUNTER — Encounter (HOSPITAL_COMMUNITY)
Admission: RE | Admit: 2019-12-20 | Discharge: 2019-12-20 | Disposition: A | Discharge status: Home / Self Care | Payer: Medicare Other | Source: Ambulatory Visit | Attending: General Surgery | Admitting: General Surgery

## 2019-12-20 ENCOUNTER — Encounter (HOSPITAL_COMMUNITY): Payer: Self-pay

## 2019-12-20 DIAGNOSIS — I10 Essential (primary) hypertension: Secondary | ICD-10-CM | POA: Insufficient documentation

## 2019-12-20 DIAGNOSIS — Z01818 Encounter for other preprocedural examination: Secondary | ICD-10-CM | POA: Diagnosis present

## 2019-12-20 LAB — BASIC METABOLIC PANEL
Anion gap: 7 (ref 5–15)
BUN: 19 mg/dL (ref 8–23)
CO2: 27 mmol/L (ref 22–32)
Calcium: 9.5 mg/dL (ref 8.9–10.3)
Chloride: 102 mmol/L (ref 98–111)
Creatinine, Ser: 0.75 mg/dL (ref 0.44–1.00)
GFR, Estimated: >60 mL/min (ref >60)
Glucose, Bld: 129 mg/dL — ABNORMAL HIGH (ref 70–99)
Potassium: 5.2 mmol/L — ABNORMAL HIGH (ref 3.5–5.1)
Sodium: 136 mmol/L (ref 135–145)

## 2019-12-20 LAB — GLUCOSE, CAPILLARY: Glucose-Capillary: 115 mg/dL — ABNORMAL HIGH (ref 70–99)

## 2019-12-20 LAB — CBC
HCT: 43.4 % (ref 36.0–46.0)
Hemoglobin: 14.3 g/dL (ref 12.0–15.0)
MCH: 31.2 pg (ref 26.0–34.0)
MCHC: 32.9 g/dL (ref 30.0–36.0)
MCV: 94.8 fL (ref 80.0–100.0)
Platelets: 256 10*3/uL (ref 150–400)
RBC: 4.58 MIL/uL (ref 3.87–5.11)
RDW: 12 % (ref 11.5–15.5)
WBC: 10.1 10*3/uL (ref 4.0–10.5)
nRBC: 0 % (ref 0.0–0.2)

## 2019-12-20 LAB — HEMOGLOBIN A1C
Hgb A1c MFr Bld: 5.8 % — ABNORMAL HIGH (ref 4.8–5.6)
Mean Plasma Glucose: 119.76 mg/dL

## 2019-12-27 ENCOUNTER — Other Ambulatory Visit (HOSPITAL_COMMUNITY)
Admission: RE | Admit: 2019-12-27 | Discharge: 2019-12-27 | Disposition: A | Discharge status: Home / Self Care | Payer: Medicare Other | Source: Ambulatory Visit | Attending: General Surgery | Admitting: General Surgery

## 2019-12-27 DIAGNOSIS — Z01812 Encounter for preprocedural laboratory examination: Secondary | ICD-10-CM | POA: Insufficient documentation

## 2019-12-27 DIAGNOSIS — Z20822 Contact with and (suspected) exposure to covid-19: Secondary | ICD-10-CM | POA: Insufficient documentation

## 2019-12-27 LAB — SARS CORONAVIRUS 2 (TAT 6-24 HRS): SARS Coronavirus 2: NEGATIVE

## 2019-12-27 NOTE — Progress Notes (Signed)
Anesthesia Chart Review   Case: [id number] Date/Time: 12/30/19 1230   Procedure: XI ROBOT ASSISTED RECTOPEXY (N/A )   Anesthesia type: General   Pre-op diagnosis: rectal prolapse   Location: [facility] ROOM 02 / [facility]   Surgeons: Leighton Ruff, MD      [id number]:74 y.o. former smoker with h/o GERD, DM II, OSA, GERD, HTN, rectal prolapse scheduled for above procedure 10/62/6948 with Dr. Leighton Ruff.   Nonobstructive cad on cath 2016. Essentially normal echo 08/2019 with EF 60-65%. Pt denies cv sx.  Seen by cardiology 08/30/2019, stable at this visit.    Anticipate pt can proceed with planned procedure barring acute status change.   VS: BP (!) 147/63   Pulse 79   Temp 36.8 C (Oral)   Resp 18   Ht 5\' 3"  (1.6 m)   Wt 72.1 kg   SpO2 99%   BMI 28.17 kg/m   PROVIDERS: Sherald Hess., MD is PCP   Lawson Radar, MD is Cardiologist  LABS: Labs reviewed: Acceptable for surgery. (all labs ordered are listed, but only abnormal results are displayed)  Labs Reviewed  HEMOGLOBIN A1C - Abnormal; Notable for the following components:      Result Value   Hgb A1c MFr Bld 5.8 (*)    All other components within normal limits  BASIC METABOLIC PANEL - Abnormal; Notable for the following components:   Potassium 5.2 (*)    Glucose, Bld 129 (*)    All other components within normal limits  GLUCOSE, CAPILLARY - Abnormal; Notable for the following components:   Glucose-Capillary 115 (*)    All other components within normal limits  CBC  TYPE AND SCREEN     IMAGES:   EKG: 12/20/2019 Rate 71 bpm  NSR Low voltage QRS Cannot rule out anterior infarct, age undetermined   CV: Echo 08/30/2019 Conclusions 1. This was a technically difficult study with suboptimal view.  2. Normal study: normal cardiac chamber sizes and function; normal valve anatomy and function; no pericardial effusion or intracardiac mass. Normal thoracic aorta and aortic arch.  EF 60-65%  Cardiac Cath  07/05/2014 IMPRESSION:  NONOBSTRUCTIVE CAD INVOLVING THE PROXIMAL LAD  NORMAL LVEDP  RECOMMENDATION:  MEDICAL THERAPY, LIFESTYLE MODIFICATION  Echo 12/15/2012 Study Conclusions   Left ventricle: The cavity size was normal. Wall thickness  was increased in a pattern of mild LVH. Systolic function  was vigorous. The estimated ejection fraction was in the  range of 65% to 70%. Wall motion was normal; there were no  regional wall motion abnormalities. Doppler parameters are  consistent with abnormal left ventricular relaxation (grade  1 diastolic dysfunction).        Past Medical History:  Diagnosis Date  . Abdominal mass   . Abnormal nuclear stress test 07/05/2014  . Acute encephalopathy 03/26/2016  . Anxiety   . Arthritis    osteoarthritis  . Colitis 2014  . Depression   . Diabetes mellitus   . Fibromyalgia   . GERD (gastroesophageal reflux disease)   . Hiatal hernia   . History of chicken pox   . History of septic shock 12/2009  . Hyperlipidemia   . Hypertension   . Migraine   . OSA (obstructive sleep apnea) 03/12/2011  . Personal history of colonic polyps   . Sepsis secondary to UTI ([place]) 03/26/2016  . Spinal stenosis     Past Surgical History:  Procedure Laterality Date  . APPENDECTOMY  1983  . BREAST BIOPSY Right 2007, 2009  .  CARDIAC CATHETERIZATION N/A 07/05/2014   Procedure: Left Heart Cath and Coronary Angiography;  Surgeon: Sherren Mocha, MD;  Location: [facility];  Service: Cardiovascular;  Laterality: N/A;  . COLONOSCOPY  2014   normal   . ESOPHAGOGASTRODUODENOSCOPY     in [place]  . MEDIAL PARTIAL KNEE REPLACEMENT Bilateral 2006  . OVARIAN CYST REMOVAL  2008   Pt states she had a ?cyst removed ?ovaries  . POLYPECTOMY  1998    MEDICATIONS: . amLODipine (NORVASC) 10 MG tablet  . aspirin 81 MG tablet  . atorvastatin (LIPITOR) 40 MG tablet  . balsalazide (COLAZAL) 750 MG capsule  . bimatoprost (LUMIGAN) 0.01 % SOLN  . brimonidine  (ALPHAGAN P) 0.1 % SOLN  . budesonide (ENTOCORT EC) 3 MG 24 hr capsule  . Calcium Carbonate-Vitamin D (CALCIUM-D) 600-400 MG-UNIT TABS  . Coenzyme Q10 (CO Q 10) 100 MG CAPS  . dicyclomine (BENTYL) 20 MG tablet  . diphenhydrAMINE (BENADRYL) 25 MG tablet  . meclizine (ANTIVERT) 25 MG tablet  . metFORMIN (GLUCOPHAGE) 1000 MG tablet  . metoprolol succinate (TOPROL-XL) 50 MG 24 hr tablet  . metoprolol tartrate (LOPRESSOR) 25 MG tablet  . Multiple Vitamin (MULTIVITAMIN) tablet  . nystatin-triamcinolone (MYCOLOG II) cream  . Omega-3 Fatty Acids (FISH OIL) 1000 MG CAPS  . omeprazole (PRILOSEC) 40 MG capsule  . oxyCODONE-acetaminophen (PERCOCET) 10-325 MG tablet  . pantoprazole (PROTONIX) 40 MG tablet  . Probiotic Product (PROBIOTIC COMPLEX ACIDOPHILUS) CAPS  . Semaglutide,0.25 or 0.5MG /DOS, (OZEMPIC, 0.25 OR 0.5 MG/DOSE,) 2 MG/1.5ML SOPN  . sertraline (ZOLOFT) 50 MG tablet  . traZODone (DESYREL) 50 MG tablet  . venlafaxine XR (EFFEXOR-XR) 75 MG 24 hr capsule  . zolpidem (AMBIEN) 5 MG tablet  . escitalopram (LEXAPRO) 20 MG tablet  . liraglutide (VICTOZA) 18 MG/3ML SOPN  . Magnesium Oxide 400 (240 Mg) MG TABS  . ondansetron (ZOFRAN ODT) 8 MG disintegrating tablet  . oxyCODONE (ROXICODONE) 5 MG immediate release tablet  . oxyCODONE-acetaminophen (PERCOCET/ROXICET) 5-325 MG tablet   No current facility-administered medications for this encounter.    Konrad Felix, PA-C [facility]-Surgical Testing [phone]

## 2019-12-29 MED ORDER — BUPIVACAINE LIPOSOME 1.3 % IJ SUSP
20.0000 mL | Freq: Once | INTRAMUSCULAR | Status: DC
  Filled 2019-12-29: qty 20

## 2019-12-30 ENCOUNTER — Inpatient Hospital Stay (HOSPITAL_COMMUNITY)
Admission: RE | Admit: 2019-12-30 | Discharge: 2020-01-02 | DRG: 331 | Disposition: A | Discharge status: Home Health | Payer: Medicare Other | Attending: General Surgery | Admitting: General Surgery

## 2019-12-30 ENCOUNTER — Inpatient Hospital Stay (HOSPITAL_COMMUNITY): Payer: Medicare Other

## 2019-12-30 ENCOUNTER — Inpatient Hospital Stay (HOSPITAL_COMMUNITY): Payer: Medicare Other | Admitting: Physician Assistant

## 2019-12-30 ENCOUNTER — Encounter (HOSPITAL_COMMUNITY): Payer: Self-pay | Admitting: General Surgery

## 2019-12-30 ENCOUNTER — Other Ambulatory Visit: Payer: Self-pay

## 2019-12-30 ENCOUNTER — Inpatient Hospital Stay (HOSPITAL_COMMUNITY): Payer: Medicare Other | Admitting: Certified Registered Nurse Anesthetist

## 2019-12-30 ENCOUNTER — Encounter (HOSPITAL_COMMUNITY)
Admission: RE | Disposition: A | Discharge status: Home / Self Care | Payer: Self-pay | Source: Home / Self Care | Attending: General Surgery

## 2019-12-30 DIAGNOSIS — K623 Rectal prolapse: Principal | ICD-10-CM | POA: Diagnosis present

## 2019-12-30 DIAGNOSIS — Z8261 Family history of arthritis: Secondary | ICD-10-CM | POA: Diagnosis not present

## 2019-12-30 DIAGNOSIS — Z7982 Long term (current) use of aspirin: Secondary | ICD-10-CM

## 2019-12-30 DIAGNOSIS — F419 Anxiety disorder, unspecified: Secondary | ICD-10-CM | POA: Diagnosis present

## 2019-12-30 DIAGNOSIS — K52832 Lymphocytic colitis: Secondary | ICD-10-CM | POA: Diagnosis present

## 2019-12-30 DIAGNOSIS — I10 Essential (primary) hypertension: Secondary | ICD-10-CM | POA: Diagnosis present

## 2019-12-30 DIAGNOSIS — K219 Gastro-esophageal reflux disease without esophagitis: Secondary | ICD-10-CM | POA: Diagnosis present

## 2019-12-30 DIAGNOSIS — Z87891 Personal history of nicotine dependence: Secondary | ICD-10-CM | POA: Diagnosis not present

## 2019-12-30 DIAGNOSIS — R531 Weakness: Secondary | ICD-10-CM | POA: Diagnosis present

## 2019-12-30 DIAGNOSIS — Z833 Family history of diabetes mellitus: Secondary | ICD-10-CM

## 2019-12-30 DIAGNOSIS — K649 Unspecified hemorrhoids: Secondary | ICD-10-CM | POA: Diagnosis present

## 2019-12-30 DIAGNOSIS — Z881 Allergy status to other antibiotic agents status: Secondary | ICD-10-CM | POA: Diagnosis not present

## 2019-12-30 DIAGNOSIS — G4730 Sleep apnea, unspecified: Secondary | ICD-10-CM | POA: Diagnosis present

## 2019-12-30 DIAGNOSIS — Z79891 Long term (current) use of opiate analgesic: Secondary | ICD-10-CM | POA: Diagnosis not present

## 2019-12-30 DIAGNOSIS — Z20822 Contact with and (suspected) exposure to covid-19: Secondary | ICD-10-CM | POA: Diagnosis present

## 2019-12-30 DIAGNOSIS — Z86010 Personal history of colonic polyps: Secondary | ICD-10-CM

## 2019-12-30 DIAGNOSIS — Z419 Encounter for procedure for purposes other than remedying health state, unspecified: Secondary | ICD-10-CM

## 2019-12-30 DIAGNOSIS — Z9049 Acquired absence of other specified parts of digestive tract: Secondary | ICD-10-CM | POA: Diagnosis not present

## 2019-12-30 DIAGNOSIS — Z888 Allergy status to other drugs, medicaments and biological substances status: Secondary | ICD-10-CM

## 2019-12-30 DIAGNOSIS — Z79899 Other long term (current) drug therapy: Secondary | ICD-10-CM | POA: Diagnosis not present

## 2019-12-30 DIAGNOSIS — Z800 Family history of malignant neoplasm of digestive organs: Secondary | ICD-10-CM

## 2019-12-30 DIAGNOSIS — K5790 Diverticulosis of intestine, part unspecified, without perforation or abscess without bleeding: Secondary | ICD-10-CM | POA: Diagnosis present

## 2019-12-30 DIAGNOSIS — Z7984 Long term (current) use of oral hypoglycemic drugs: Secondary | ICD-10-CM

## 2019-12-30 DIAGNOSIS — E119 Type 2 diabetes mellitus without complications: Secondary | ICD-10-CM | POA: Diagnosis present

## 2019-12-30 DIAGNOSIS — Z8249 Family history of ischemic heart disease and other diseases of the circulatory system: Secondary | ICD-10-CM

## 2019-12-30 HISTORY — PX: XI ROBOT ASSISTED RECTOPEXY: SHX6788

## 2019-12-30 LAB — TYPE AND SCREEN
ABO/RH(D): A NEG
Antibody Screen: NEGATIVE

## 2019-12-30 LAB — ABO/RH: ABO/RH(D): A NEG

## 2019-12-30 LAB — GLUCOSE, CAPILLARY: Glucose-Capillary: 144 mg/dL — ABNORMAL HIGH (ref 70–99)

## 2019-12-30 SURGERY — RECTOPEXY, ROBOT-ASSISTED
Anesthesia: General | Site: Abdomen

## 2019-12-30 MED ORDER — FENTANYL CITRATE (PF) 100 MCG/2ML IJ SOLN
25.0000 ug | INTRAMUSCULAR | Status: DC | PRN
  Administered 2019-12-30: 50 ug via INTRAVENOUS

## 2019-12-30 MED ORDER — DEXAMETHASONE SODIUM PHOSPHATE 10 MG/ML IJ SOLN
INTRAMUSCULAR | Status: AC
  Filled 2019-12-30: qty 1

## 2019-12-30 MED ORDER — MECLIZINE HCL 25 MG PO TABS: 25.0000 mg | ORAL_TABLET | Freq: Three times a day (TID) | ORAL | Status: DC | PRN

## 2019-12-30 MED ORDER — OXYCODONE-ACETAMINOPHEN 10-325 MG PO TABS: 1.0000 | ORAL_TABLET | Freq: Three times a day (TID) | ORAL | Status: DC | PRN

## 2019-12-30 MED ORDER — GABAPENTIN 300 MG PO CAPS
300.0000 mg | ORAL_CAPSULE | Freq: Two times a day (BID) | ORAL | Status: DC
  Administered 2019-12-30 – 2020-01-02 (×6): 300 mg via ORAL
  Filled 2019-12-30 (×6): qty 1

## 2019-12-30 MED ORDER — VENLAFAXINE HCL ER 150 MG PO CP24
150.0000 mg | ORAL_CAPSULE | Freq: Every day | ORAL | Status: DC
  Administered 2019-12-31 – 2020-01-02 (×3): 150 mg via ORAL
  Filled 2019-12-30 (×3): qty 1

## 2019-12-30 MED ORDER — BALSALAZIDE DISODIUM 750 MG PO CAPS
2250.0000 mg | ORAL_CAPSULE | Freq: Two times a day (BID) | ORAL | Status: DC
  Administered 2019-12-30 – 2020-01-02 (×6): 2250 mg via ORAL
  Filled 2019-12-30 (×8): qty 3

## 2019-12-30 MED ORDER — ONDANSETRON HCL 4 MG/2ML IJ SOLN: 4.0000 mg | Freq: Once | INTRAMUSCULAR | Status: DC | PRN

## 2019-12-30 MED ORDER — ENSURE SURGERY PO LIQD
237.0000 mL | Freq: Two times a day (BID) | ORAL | Status: DC
  Administered 2020-01-01 – 2020-01-02 (×3): 237 mL via ORAL

## 2019-12-30 MED ORDER — LIDOCAINE 2% (20 MG/ML) 5 ML SYRINGE
INTRAMUSCULAR | Status: DC | PRN
  Administered 2019-12-30: 80 mg via INTRAVENOUS

## 2019-12-30 MED ORDER — ROCURONIUM BROMIDE 10 MG/ML (PF) SYRINGE
PREFILLED_SYRINGE | INTRAVENOUS | Status: AC
  Filled 2019-12-30: qty 10

## 2019-12-30 MED ORDER — FENTANYL CITRATE (PF) 250 MCG/5ML IJ SOLN
INTRAMUSCULAR | Status: AC
  Filled 2019-12-30: qty 5

## 2019-12-30 MED ORDER — ONDANSETRON HCL 4 MG PO TABS: 4.0000 mg | ORAL_TABLET | Freq: Four times a day (QID) | ORAL | Status: DC | PRN

## 2019-12-30 MED ORDER — CHLORHEXIDINE GLUCONATE 0.12 % MT SOLN
15.0000 mL | Freq: Once | OROMUCOSAL | Status: AC
  Administered 2019-12-30: 15 mL via OROMUCOSAL

## 2019-12-30 MED ORDER — ALUM & MAG HYDROXIDE-SIMETH 200-200-20 MG/5ML PO SUSP: 30.0000 mL | Freq: Four times a day (QID) | ORAL | Status: DC | PRN

## 2019-12-30 MED ORDER — LACTATED RINGERS IV SOLN: INTRAVENOUS | Status: DC

## 2019-12-30 MED ORDER — SODIUM CHLORIDE 0.9 % IV SOLN
2.0000 g | Freq: Two times a day (BID) | INTRAVENOUS | Status: AC
  Administered 2019-12-30: 2 g via INTRAVENOUS
  Filled 2019-12-30: qty 2

## 2019-12-30 MED ORDER — HYDROMORPHONE HCL 1 MG/ML IJ SOLN
0.5000 mg | INTRAMUSCULAR | Status: DC | PRN
  Administered 2019-12-30 – 2020-01-02 (×7): 0.5 mg via INTRAVENOUS
  Filled 2019-12-30 (×7): qty 0.5

## 2019-12-30 MED ORDER — PHENYLEPHRINE 40 MCG/ML (10ML) SYRINGE FOR IV PUSH (FOR BLOOD PRESSURE SUPPORT)
PREFILLED_SYRINGE | INTRAVENOUS | Status: AC
  Filled 2019-12-30: qty 10

## 2019-12-30 MED ORDER — SUGAMMADEX SODIUM 200 MG/2ML IV SOLN
INTRAVENOUS | Status: DC | PRN
  Administered 2019-12-30: 150 mg via INTRAVENOUS

## 2019-12-30 MED ORDER — DEXAMETHASONE SODIUM PHOSPHATE 10 MG/ML IJ SOLN
INTRAMUSCULAR | Status: DC | PRN
  Administered 2019-12-30: 10 mg via INTRAVENOUS

## 2019-12-30 MED ORDER — OXYCODONE-ACETAMINOPHEN 5-325 MG PO TABS: 1.0000 | ORAL_TABLET | Freq: Three times a day (TID) | ORAL | Status: DC | PRN

## 2019-12-30 MED ORDER — ONDANSETRON HCL 4 MG/2ML IJ SOLN
INTRAMUSCULAR | Status: AC
  Filled 2019-12-30: qty 2

## 2019-12-30 MED ORDER — HYDRALAZINE HCL 20 MG/ML IJ SOLN
INTRAMUSCULAR | Status: DC | PRN
  Administered 2019-12-30: 5 mg via INTRAVENOUS

## 2019-12-30 MED ORDER — PROPOFOL 10 MG/ML IV BOLUS
INTRAVENOUS | Status: DC | PRN
  Administered 2019-12-30: 150 mg via INTRAVENOUS
  Administered 2019-12-30: 50 mg via INTRAVENOUS

## 2019-12-30 MED ORDER — BUDESONIDE 3 MG PO CPEP
6.0000 mg | ORAL_CAPSULE | Freq: Every day | ORAL | Status: DC
  Administered 2019-12-31 – 2020-01-02 (×3): 6 mg via ORAL
  Filled 2019-12-30 (×3): qty 2

## 2019-12-30 MED ORDER — OXYCODONE HCL 5 MG/5ML PO SOLN: 5.0000 mg | Freq: Once | ORAL | Status: DC | PRN

## 2019-12-30 MED ORDER — OXYCODONE HCL 5 MG PO TABS
5.0000 mg | ORAL_TABLET | Freq: Three times a day (TID) | ORAL | Status: DC | PRN
  Administered 2019-12-30: 5 mg via ORAL
  Filled 2019-12-30: qty 1

## 2019-12-30 MED ORDER — ACETAMINOPHEN 500 MG PO TABS
1000.0000 mg | ORAL_TABLET | Freq: Four times a day (QID) | ORAL | Status: DC
  Administered 2019-12-30 – 2020-01-02 (×10): 1000 mg via ORAL
  Filled 2019-12-30 (×12): qty 2

## 2019-12-30 MED ORDER — AMLODIPINE BESYLATE 10 MG PO TABS
10.0000 mg | ORAL_TABLET | Freq: Every day | ORAL | Status: DC
  Administered 2019-12-31 – 2020-01-02 (×3): 10 mg via ORAL
  Filled 2019-12-30 (×3): qty 1

## 2019-12-30 MED ORDER — FENTANYL CITRATE (PF) 100 MCG/2ML IJ SOLN
INTRAMUSCULAR | Status: DC | PRN
  Administered 2019-12-30 (×5): 50 ug via INTRAVENOUS

## 2019-12-30 MED ORDER — LIDOCAINE HCL (PF) 2 % IJ SOLN
INTRAMUSCULAR | Status: DC | PRN
  Administered 2019-12-30: 1.5 mg/kg/h via INTRADERMAL

## 2019-12-30 MED ORDER — ONDANSETRON HCL 4 MG/2ML IJ SOLN
INTRAMUSCULAR | Status: DC | PRN
  Administered 2019-12-30: 4 mg via INTRAVENOUS

## 2019-12-30 MED ORDER — ALVIMOPAN 12 MG PO CAPS: 12.0000 mg | ORAL_CAPSULE | Freq: Two times a day (BID) | ORAL | Status: DC

## 2019-12-30 MED ORDER — HYDROMORPHONE HCL 1 MG/ML IJ SOLN
0.2500 mg | INTRAMUSCULAR | Status: DC | PRN
  Administered 2019-12-30: 0.25 mg via INTRAVENOUS
  Administered 2019-12-30: 0.5 mg via INTRAVENOUS
  Administered 2019-12-30: 0.25 mg via INTRAVENOUS
  Administered 2019-12-30: 0.5 mg via INTRAVENOUS
  Administered 2019-12-30: 0.25 mg via INTRAVENOUS

## 2019-12-30 MED ORDER — ESCITALOPRAM OXALATE 20 MG PO TABS
20.0000 mg | ORAL_TABLET | Freq: Every day | ORAL | Status: DC
  Administered 2019-12-31 – 2020-01-02 (×3): 20 mg via ORAL
  Filled 2019-12-30 (×3): qty 1

## 2019-12-30 MED ORDER — HYDRALAZINE HCL 20 MG/ML IJ SOLN
INTRAMUSCULAR | Status: AC
  Filled 2019-12-30: qty 1

## 2019-12-30 MED ORDER — SERTRALINE HCL 50 MG PO TABS
50.0000 mg | ORAL_TABLET | Freq: Every day | ORAL | Status: DC
  Administered 2019-12-31 – 2020-01-02 (×3): 50 mg via ORAL
  Filled 2019-12-30 (×3): qty 1

## 2019-12-30 MED ORDER — LACTATED RINGERS IR SOLN
Status: DC | PRN
  Administered 2019-12-30: 1000 mL

## 2019-12-30 MED ORDER — ROCURONIUM BROMIDE 10 MG/ML (PF) SYRINGE
PREFILLED_SYRINGE | INTRAVENOUS | Status: DC | PRN
  Administered 2019-12-30: 30 mg via INTRAVENOUS
  Administered 2019-12-30: 70 mg via INTRAVENOUS

## 2019-12-30 MED ORDER — ENSURE PRE-SURGERY PO LIQD
592.0000 mL | Freq: Once | ORAL | Status: DC
  Filled 2019-12-30: qty 592

## 2019-12-30 MED ORDER — PANTOPRAZOLE SODIUM 40 MG PO TBEC
40.0000 mg | DELAYED_RELEASE_TABLET | Freq: Every day | ORAL | Status: DC
  Administered 2019-12-30 – 2020-01-01 (×3): 40 mg via ORAL
  Filled 2019-12-30 (×3): qty 1

## 2019-12-30 MED ORDER — ENOXAPARIN SODIUM 40 MG/0.4ML ~~LOC~~ SOLN
40.0000 mg | SUBCUTANEOUS | Status: DC
  Administered 2019-12-31 – 2020-01-02 (×3): 40 mg via SUBCUTANEOUS
  Filled 2019-12-30 (×3): qty 0.4

## 2019-12-30 MED ORDER — BUPIVACAINE LIPOSOME 1.3 % IJ SUSP
INTRAMUSCULAR | Status: DC | PRN
  Administered 2019-12-30: 20 mL

## 2019-12-30 MED ORDER — ORAL CARE MOUTH RINSE: 15.0000 mL | Freq: Once | OROMUCOSAL | Status: AC

## 2019-12-30 MED ORDER — GABAPENTIN 300 MG PO CAPS
300.0000 mg | ORAL_CAPSULE | ORAL | Status: AC
  Administered 2019-12-30: 300 mg via ORAL
  Filled 2019-12-30: qty 1

## 2019-12-30 MED ORDER — ALBUMIN HUMAN 5 % IV SOLN
INTRAVENOUS | Status: AC
  Filled 2019-12-30: qty 750

## 2019-12-30 MED ORDER — HYDROMORPHONE HCL 1 MG/ML IJ SOLN
INTRAMUSCULAR | Status: AC
  Administered 2019-12-30: 0.25 mg via INTRAVENOUS
  Filled 2019-12-30: qty 2

## 2019-12-30 MED ORDER — ENSURE PRE-SURGERY PO LIQD
296.0000 mL | Freq: Once | ORAL | Status: DC
  Filled 2019-12-30: qty 296

## 2019-12-30 MED ORDER — SACCHAROMYCES BOULARDII 250 MG PO CAPS
250.0000 mg | ORAL_CAPSULE | Freq: Two times a day (BID) | ORAL | Status: DC
  Administered 2019-12-30 – 2020-01-02 (×6): 250 mg via ORAL
  Filled 2019-12-30 (×6): qty 1

## 2019-12-30 MED ORDER — FENTANYL CITRATE (PF) 100 MCG/2ML IJ SOLN
INTRAMUSCULAR | Status: AC
  Filled 2019-12-30: qty 2

## 2019-12-30 MED ORDER — 0.9 % SODIUM CHLORIDE (POUR BTL) OPTIME
TOPICAL | Status: DC | PRN
  Administered 2019-12-30: 1000 mL

## 2019-12-30 MED ORDER — TRAZODONE HCL 50 MG PO TABS
50.0000 mg | ORAL_TABLET | Freq: Every day | ORAL | Status: DC
  Administered 2019-12-30 – 2020-01-01 (×3): 50 mg via ORAL
  Filled 2019-12-30 (×3): qty 1

## 2019-12-30 MED ORDER — METFORMIN HCL 500 MG PO TABS
1000.0000 mg | ORAL_TABLET | Freq: Two times a day (BID) | ORAL | Status: DC
  Administered 2019-12-31 – 2020-01-02 (×5): 1000 mg via ORAL
  Filled 2019-12-30 (×5): qty 2

## 2019-12-30 MED ORDER — KCL IN DEXTROSE-NACL 20-5-0.45 MEQ/L-%-% IV SOLN
INTRAVENOUS | Status: DC
  Filled 2019-12-30: qty 1000

## 2019-12-30 MED ORDER — BUPIVACAINE-EPINEPHRINE (PF) 0.25% -1:200000 IJ SOLN
INTRAMUSCULAR | Status: AC
  Filled 2019-12-30: qty 30

## 2019-12-30 MED ORDER — BUPIVACAINE-EPINEPHRINE 0.25% -1:200000 IJ SOLN
INTRAMUSCULAR | Status: DC | PRN
  Administered 2019-12-30: 30 mL

## 2019-12-30 MED ORDER — OXYCODONE HCL 5 MG PO TABS: 5.0000 mg | ORAL_TABLET | Freq: Once | ORAL | Status: DC | PRN

## 2019-12-30 MED ORDER — ZOLPIDEM TARTRATE 5 MG PO TABS
5.0000 mg | ORAL_TABLET | Freq: Every evening | ORAL | Status: DC | PRN
  Administered 2019-12-30 – 2020-01-01 (×3): 5 mg via ORAL
  Filled 2019-12-30 (×3): qty 1

## 2019-12-30 MED ORDER — LATANOPROST 0.005 % OP SOLN
1.0000 [drp] | Freq: Every day | OPHTHALMIC | Status: DC
  Administered 2019-12-30 – 2020-01-01 (×2): 1 [drp] via OPHTHALMIC
  Filled 2019-12-30 (×2): qty 2.5

## 2019-12-30 MED ORDER — LIDOCAINE 2% (20 MG/ML) 5 ML SYRINGE
INTRAMUSCULAR | Status: AC
  Filled 2019-12-30: qty 5

## 2019-12-30 MED ORDER — ACETAMINOPHEN 500 MG PO TABS
1000.0000 mg | ORAL_TABLET | ORAL | Status: AC
  Administered 2019-12-30: 1000 mg via ORAL
  Filled 2019-12-30: qty 2

## 2019-12-30 MED ORDER — METOPROLOL SUCCINATE ER 50 MG PO TB24
50.0000 mg | ORAL_TABLET | Freq: Every day | ORAL | Status: DC
  Administered 2019-12-31 – 2020-01-02 (×3): 50 mg via ORAL
  Filled 2019-12-30 (×3): qty 1

## 2019-12-30 MED ORDER — BRIMONIDINE TARTRATE 0.15 % OP SOLN
1.0000 [drp] | Freq: Two times a day (BID) | OPHTHALMIC | Status: DC
  Administered 2019-12-30 – 2020-01-02 (×6): 1 [drp] via OPHTHALMIC
  Filled 2019-12-30: qty 5

## 2019-12-30 MED ORDER — SIMETHICONE 80 MG PO CHEW: 40.0000 mg | CHEWABLE_TABLET | Freq: Four times a day (QID) | ORAL | Status: DC | PRN

## 2019-12-30 MED ORDER — SODIUM CHLORIDE 0.9 % IV SOLN
2.0000 g | INTRAVENOUS | Status: AC
  Administered 2019-12-30: 2 g via INTRAVENOUS
  Filled 2019-12-30: qty 2

## 2019-12-30 MED ORDER — ONDANSETRON HCL 4 MG/2ML IJ SOLN: 4.0000 mg | Freq: Four times a day (QID) | INTRAMUSCULAR | Status: DC | PRN

## 2019-12-30 MED ORDER — LACTATED RINGERS IV SOLN: INTRAVENOUS | Status: DC | PRN

## 2019-12-30 SURGICAL SUPPLY — 57 items
BLADE SURG 15 STRL LF DISP TIS (BLADE) ×1 IMPLANT
BLADE SURG 15 STRL SS (BLADE) ×2
COVER MAYO STAND STRL (DRAPES) ×2 IMPLANT
COVER SURGICAL LIGHT HANDLE (MISCELLANEOUS) ×2 IMPLANT
COVER TIP SHEARS 8 DVNC (MISCELLANEOUS) ×1 IMPLANT
COVER TIP SHEARS 8MM DA VINCI (MISCELLANEOUS) ×2
COVER WAND RF STERILE (DRAPES) ×2 IMPLANT
DECANTER SPIKE VIAL GLASS SM (MISCELLANEOUS) ×2 IMPLANT
DERMABOND ADVANCED (GAUZE/BANDAGES/DRESSINGS) ×2
DERMABOND ADVANCED .7 DNX12 (GAUZE/BANDAGES/DRESSINGS) ×2 IMPLANT
DRAIN CHANNEL 10M FLAT 3/4 FLT (DRAIN) ×2 IMPLANT
DRAPE ARM DVNC X/XI (DISPOSABLE) ×4 IMPLANT
DRAPE COLUMN DVNC XI (DISPOSABLE) ×1 IMPLANT
DRAPE DA VINCI XI ARM (DISPOSABLE) ×8
DRAPE DA VINCI XI COLUMN (DISPOSABLE) ×2
DRAPE SURG IRRIG POUCH 19X23 (DRAPES) ×2 IMPLANT
ELECT PENCIL ROCKER SW 15FT (MISCELLANEOUS) ×2 IMPLANT
ELECT REM PT RETURN 15FT ADLT (MISCELLANEOUS) ×2 IMPLANT
EVACUATOR SILICONE 100CC (DRAIN) ×2 IMPLANT
GLOVE BIO SURGEON STRL SZ 6.5 (GLOVE) ×4 IMPLANT
GLOVE BIOGEL PI IND STRL 7.0 (GLOVE) ×2 IMPLANT
GLOVE BIOGEL PI INDICATOR 7.0 (GLOVE) ×2
GOWN STRL REUS W/TWL XL LVL3 (GOWN DISPOSABLE) ×4 IMPLANT
HOLDER FOLEY CATH W/STRAP (MISCELLANEOUS) ×2 IMPLANT
IRRIG SUCT STRYKERFLOW 2 WTIP (MISCELLANEOUS) ×2
IRRIGATION SUCT STRKRFLW 2 WTP (MISCELLANEOUS) ×1 IMPLANT
KIT BASIN OR (CUSTOM PROCEDURE TRAY) ×2 IMPLANT
LEGGING LITHOTOMY PAIR STRL (DRAPES) ×2 IMPLANT
LUBRICANT JELLY K Y 4OZ (MISCELLANEOUS) ×2 IMPLANT
NEEDLE HYPO 22GX1.5 SAFETY (NEEDLE) ×2 IMPLANT
NEEDLE INSUFFLATION 14GA 120MM (NEEDLE) ×2 IMPLANT
PACK CARDIOVASCULAR III (CUSTOM PROCEDURE TRAY) ×2 IMPLANT
PAD POSITIONING PINK XL (MISCELLANEOUS) ×2 IMPLANT
PROTECTOR NERVE ULNAR (MISCELLANEOUS) ×4 IMPLANT
SCISSORS LAP 5X45 EPIX DISP (ENDOMECHANICALS) ×2 IMPLANT
SEAL CANN UNIV 5-8 DVNC XI (MISCELLANEOUS) ×4 IMPLANT
SEAL XI 5MM-8MM UNIVERSAL (MISCELLANEOUS) ×8
SEALER VESSEL DA VINCI XI (MISCELLANEOUS) ×2
SEALER VESSEL EXT DVNC XI (MISCELLANEOUS) ×1 IMPLANT
SOL ANTI FOG 6CC (MISCELLANEOUS) ×1 IMPLANT
SOLUTION ANTI FOG 6CC (MISCELLANEOUS) ×1
SOLUTION ELECTROLUBE (MISCELLANEOUS) ×2 IMPLANT
SPONGE LAP 18X18 RF (DISPOSABLE) ×2 IMPLANT
SUT ETHIBOND 2 0 SH (SUTURE) ×4
SUT ETHIBOND 2 0 SH 36X2 (SUTURE) ×2 IMPLANT
SUT ETHILON 2 0 PS N (SUTURE) ×2 IMPLANT
SUT VIC AB 4-0 PS2 18 (SUTURE) ×2 IMPLANT
SUT VLOC 180 2-0 6IN GS21 (SUTURE) ×4 IMPLANT
SYR 10ML ECCENTRIC (SYRINGE) ×2 IMPLANT
SYR CONTROL 10ML LL (SYRINGE) ×2 IMPLANT
TOWEL OR 17X26 10 PK STRL BLUE (TOWEL DISPOSABLE) ×2 IMPLANT
TOWEL OR NON WOVEN STRL DISP B (DISPOSABLE) ×2 IMPLANT
TRAY FOLEY MTR SLVR 14FR STAT (SET/KITS/TRAYS/PACK) ×2 IMPLANT
TRAY FOLEY MTR SLVR 16FR STAT (SET/KITS/TRAYS/PACK) IMPLANT
TROCAR ADV FIXATION 5X100MM (TROCAR) ×2 IMPLANT
TUBING CONNECTING 10 (TUBING) IMPLANT
TUBING INSUFFLATION 10FT LAP (TUBING) ×2 IMPLANT

## 2019-12-30 NOTE — Anesthesia Postprocedure Evaluation (Signed)
Anesthesia Post Note  Patient: Kimberly Lin  Procedure(s) Performed: XI ROBOT ASSISTED RECTOPEXY (N/A Abdomen)     Patient location during evaluation: PACU Anesthesia Type: General Level of consciousness: awake and alert Pain management: pain level controlled Vital Signs Assessment: post-procedure vital signs reviewed and stable Respiratory status: spontaneous breathing, nonlabored ventilation, respiratory function stable and patient connected to nasal cannula oxygen Cardiovascular status: blood pressure returned to baseline and stable Postop Assessment: no apparent nausea or vomiting Anesthetic complications: no   No complications documented.  Last Vitals:  Vitals:   12/30/19 1600 12/30/19 1605  BP: (!) 156/77   Pulse: 73 72  Resp: 12 (!) 9  Temp:    SpO2: 94% 93%    Last Pain:  Vitals:   12/30/19 1605  TempSrc:   PainSc: 10-Worst pain ever                 Muzamil Harker S

## 2019-12-30 NOTE — Anesthesia Preprocedure Evaluation (Signed)
Anesthesia Evaluation  Patient identified by MRN, date of birth, ID band Patient awake    Reviewed: Allergy & Precautions, NPO status , Patient's Chart, lab work & pertinent test results  Airway Mallampati: II  TM Distance: >3 FB Neck ROM: Full    Dental no notable dental hx.    Pulmonary sleep apnea , former smoker,    Pulmonary exam normal breath sounds clear to auscultation       Cardiovascular hypertension, + DOE  Normal cardiovascular exam Rhythm:Regular Rate:Normal     Neuro/Psych negative neurological ROS  negative psych ROS   GI/Hepatic Neg liver ROS, GERD  Medicated,  Endo/Other  diabetes  Renal/GU negative Renal ROS  negative genitourinary   Musculoskeletal negative musculoskeletal ROS (+)   Abdominal   Peds negative pediatric ROS (+)  Hematology negative hematology ROS (+)   Anesthesia Other Findings   Reproductive/Obstetrics negative OB ROS                             Anesthesia Physical Anesthesia Plan  ASA: III  Anesthesia Plan: General   Post-op Pain Management:    Induction: Intravenous  PONV Risk Score and Plan: 3 and Ondansetron, Dexamethasone and Treatment may vary due to age or medical condition  Airway Management Planned: Oral ETT  Additional Equipment:   Intra-op Plan:   Post-operative Plan: Extubation in OR  Informed Consent: I have reviewed the patients History and Physical, chart, labs and discussed the procedure including the risks, benefits and alternatives for the proposed anesthesia with the patient or authorized representative who has indicated his/her understanding and acceptance.     Dental advisory given  Plan Discussed with: CRNA and Surgeon  Anesthesia Plan Comments:         Anesthesia Quick Evaluation

## 2019-12-30 NOTE — H&P (Addendum)
The patient is a 74 year old female who presents with rectal prolapse. 74 year old female who presents to the office with complaints of rectal pain, pressure and burning. She reports prolapsing tissue with bowel movements and with urination. This has to be manually reduced. She denies any rectal bleeding. Last colonoscopy was in 2019. She has a history of lymphocytic colitis and takes medication for this. She has chronic pain due to a shoulder injury and takes oxycodone twice a day. Surgical history consistent for appendectomy and ovarian cystectomy. She has frequent UTIs as well. She reports fecal incontinence after bowel movements and wears a pad on a daily basis.   Past Surgical History  Appendectomy Breast Biopsy Right. multiple Colon Polyp Removal - Colonoscopy Colon Polyp Removal - Open Knee Surgery Bilateral.  Diagnostic Studies History  Colonoscopy 1-5 years ago Mammogram 1-3 years ago Pap Smear 1-5 years ago  Allergies  Erythromycin Derivatives Swelling. Losartan Potassium *ANTIHYPERTENSIVES* Cough. predniSONE *CORTICOSTEROIDS* Allergies Reconciled  Medication History  oxyCODONE-Acetaminophen (5-325MG  Tablet, Oral) Active. clonazePAM (0.5MG  Tablet, Oral) Active. Metoprolol Succinate ER (50MG  Tablet ER 24HR, Oral) Active. Ondansetron (4MG  Tablet Disint, Oral) Active. Norvasc (10MG  Tablet, Oral) Active. Aspirin (81MG  Tablet DR, Oral) Active. Lipitor (40MG  Tablet, Oral) Active. Calcium Carbonate (600MG  Tablet, Oral) Active. Co Q 10 (100MG  Capsule, Oral) Active. Bentyl (20MG  Capsule, Oral) Active. Benadryl (25MG  Tablet, Oral as needed) Active. Lexapro (20MG  Tablet, Oral) Active. Victoza (18MG /3ML Solution, Subcutaneous) Active. Meclizine HCl (25MG  Tablet, Oral as needed) Active. metFORMIN HCl (1000MG  Tablet, Oral) Active. Multivitamin (Oral) Active. Fish Oil (1000MG  Capsule, Oral) Active. Zofran ODT (8MG  Tablet Disint, Oral)  Active. Roxicodone (5MG  Tablet, Oral) Active. Protonix (40MG  Tablet DR, Oral) Active. Probiotic (Oral) Active. Effexor (75MG  Tablet, Oral) Active. Medications Reconciled  Social History  Alcohol use Occasional alcohol use. Caffeine use Coffee. Tobacco use Never smoker.  Family History  Alcohol Abuse Father. Arthritis Brother, Mother, Sister. Breast Cancer Mother, Sister. Diabetes Mellitus Brother, Father, Mother, Sister. Heart disease in female family member before age 18 Hypertension Brother, Father, Mother, Sister. Rectal Cancer Father. Respiratory Condition Brother, Mother.  Pregnancy / Birth History Age at menarche 1 years. Age of menopause 77-50 Gravida 3 Maternal age 19-20 Para 2  Other Problems  Anxiety Disorder Arthritis Back Pain Diabetes Mellitus Gastroesophageal Reflux Disease Heart murmur High blood pressure     Review of Systems  General Not Present- Appetite Loss, Chills, Fatigue, Fever, Night Sweats, Weight Gain and Weight Loss. Skin Not Present- Change in Wart/Mole, Dryness, Hives, Jaundice, New Lesions, Non-Healing Wounds, Rash and Ulcer. HEENT Not Present- Earache, Hearing Loss, Hoarseness, Nose Bleed, Oral Ulcers, Ringing in the Ears, Seasonal Allergies, Sinus Pain, Sore Throat, Visual Disturbances, Wears glasses/contact lenses and Yellow Eyes. Respiratory Not Present- Bloody sputum, Chronic Cough, Difficulty Breathing, Snoring and Wheezing. Breast Not Present- Breast Mass, Breast Pain, Nipple Discharge and Skin Changes. Cardiovascular Not Present- Chest Pain, Difficulty Breathing Lying Down, Leg Cramps, Palpitations, Rapid Heart Rate, Shortness of Breath and Swelling of Extremities. Gastrointestinal Present- Hemorrhoids and Rectal Pain. Not Present- Abdominal Pain, Bloating, Bloody Stool, Change in Bowel Habits, Chronic diarrhea, Constipation, Difficulty Swallowing, Excessive gas, Gets full quickly at meals,  Indigestion, Nausea and Vomiting. Female Genitourinary Present- Painful Urination. Not Present- Frequency, Nocturia, Pelvic Pain and Urgency. Musculoskeletal Present- Back Pain, Joint Pain and Joint Stiffness. Not Present- Muscle Pain, Muscle Weakness and Swelling of Extremities. Psychiatric Not Present- Anxiety, Bipolar, Change in Sleep Pattern, Depression, Fearful and Frequent crying. Endocrine Not Present- Cold Intolerance, Excessive Hunger, Hair Changes, Heat  Intolerance, Hot flashes and New Diabetes. Hematology Not Present- Blood Thinners, Easy Bruising, Excessive bleeding, Gland problems, HIV and Persistent Infections.  BP (!) 155/81    Pulse 75    Temp 97.8 F (36.6 C) (Oral)    Resp 18    SpO2 97%      Physical Exam   General Mental Status-Alert. General Appearance-Cooperative. CV: RRR Lungs: CTA Abd: soft Rectal Anorectal Exam External - Note: Circumferential rectal mucosal prolapse noted on Valsalva. Internal - decreased sphincter tone.    Assessment & Plan   RECTAL PROLAPSE (K62.3) Impression: 74 year old female who presents to the office for evaluation of rectal pain. On physical exam, she was found to have rectal prolapse, which was incomplete and easily reducible. Recent colonoscopy was reviewed.  Cardiac clearance obtained prior to surgery. I have recommended a robotic-assisted rectopexy without resection. Due to her incontinence and no major signs of constipation. We did discussed that rectopexy alone may make her more constipated. We also discussed that her incontinence may worsen after surgery, but this does tend to improve with time. The surgery and anatomy were described to the patient as well as the risks of surgery and the possible complications. These include: Bleeding, deep abdominal infections and possible wound complications such as hernia and infection, damage to adjacent structures, leak of surgical connections, which can lead to other surgeries  and possibly an ostomy, possible need for other procedures, such as abscess drains in radiology, possible prolonged hospital stay, possible diarrhea from removal of part of the colon, possible constipation from narcotics, possible bowel, bladder or sexual dysfunction if having rectal surgery, prolonged fatigue/weakness or appetite loss, possible early recurrence of of disease, possible complications of their medical problems such as heart disease or arrhythmias or lung problems, death (less than 1%). I believe the patient understands and wishes to proceed with the surgery.

## 2019-12-30 NOTE — Transfer of Care (Signed)
Immediate Anesthesia Transfer of Care Note  Patient: Kimberly Lin  Procedure(s) Performed: XI ROBOT ASSISTED RECTOPEXY (N/A Abdomen)  Patient Location: PACU  Anesthesia Type:General  Level of Consciousness: awake, alert  and oriented  Airway & Oxygen Therapy: Patient Spontanous Breathing and Patient connected to face mask  Post-op Assessment: Report given to RN and Post -op Vital signs reviewed and stable  Post vital signs: Reviewed and stable  Last Vitals:  Vitals Value Taken Time  BP    Temp    Pulse    Resp    SpO2      Last Pain:  Vitals:   12/30/19 1103  TempSrc: Oral  PainSc: 6          Complications: No complications documented.

## 2019-12-30 NOTE — Progress Notes (Signed)
Entereg: P&T Approved Criteria for Use Policy  - Order is requested by a CREDENTIALED PRESCRIBER  - No therapeutic opioid doses for > 7 consecutive days immediately prior to Alvimopan use  -Prescribed for the FDA-approved indication: partial large or small bowel resection with pimary anastomosis. Note: exploratory laparotomy patients can qualify for the preop dose if they can take PO meds.  -Patient received a pre-operative dose (not ordered) -Screen for ESRD and severe hepatic disease  -Drug is ordered ORALLY, not via an NG tube  -Schedule first postop dose for the morning of POD#1  -Limit therapy in any patient to NO MORE than 15 doses total.  -Therapy must be discontinued at discharge - the patient should never be given a discharge prescription.  -Pharmacy approved to d/c once pt has a bowel movement   Peggyann Juba, PharmD, BCPS Pharmacy: [phone] 12/30/2019 5:54 PM

## 2019-12-30 NOTE — Anesthesia Procedure Notes (Signed)
Procedure Name: Intubation Date/Time: 12/30/2019 12:44 PM Performed by: Genelle Bal, CRNA Pre-anesthesia Checklist: Patient identified, Emergency Drugs available, Suction available and Patient being monitored Patient Re-evaluated:Patient Re-evaluated prior to induction Oxygen Delivery Method: Circle system utilized Preoxygenation: Pre-oxygenation with 100% oxygen Induction Type: IV induction Ventilation: Mask ventilation without difficulty Laryngoscope Size: Miller and 2 Grade View: Grade I Tube type: Oral Tube size: 7.0 mm Number of attempts: 1 Airway Equipment and Method: Stylet and Oral airway Placement Confirmation: ETT inserted through vocal cords under direct vision,  positive ETCO2 and breath sounds checked- equal and bilateral Secured at: 21 cm Tube secured with: Tape Dental Injury: Teeth and Oropharynx as per pre-operative assessment

## 2019-12-30 NOTE — Discharge Instructions (Signed)

## 2019-12-30 NOTE — Op Note (Signed)
12/30/2019  3:19 PM  PATIENT:  Kimberly Lin  74 y.o. female  Patient Care Team: Sherald Hess., MD as PCP - General (Family Medicine) Ardis Hughs, MD as Attending Physician (Urology)  PRE-OPERATIVE DIAGNOSIS:  rectal prolapse  POST-OPERATIVE DIAGNOSIS:  rectal prolapse  PROCEDURE:  XI ROBOT ASSISTED RECTOPEXY  SURGEON:  Surgeon(s): Leighton Ruff, MD Ileana Roup, MD  ASSISTANT: Dr Dema Severin   ANESTHESIA:   local and general  EBL: 300 ml Total I/O In: 1800 [I.V.:1700; IV Piggyback:100] Out: 1500 [Urine:1200; Blood:300]  Delay start of Pharmacological VTE agent (>24hrs) due to surgical blood loss or risk of bleeding:  no  DRAINS: (17F) Jackson-Pratt drain(s) with closed bulb suction in the pelvis   SPECIMEN:  none  DISPOSITION OF SPECIMEN:  n/a  COUNTS:  YES  PLAN OF CARE: Admit to inpatient   PATIENT DISPOSITION:  PACU - hemodynamically stable.  INDICATION:    74 y.o. F with incomplete rectal prolapse.  I recommended rectopexy:  The anatomy & physiology of the digestive tract was discussed.  The pathophysiology was discussed.  Natural history risks without surgery was discussed.   I worked to give an overview of the disease and the frequent need to have multispecialty involvement.  I feel the risks of no intervention will lead to serious problems that outweigh the operative risks; therefore, I recommended a rectopexy to remove the clinical pathology.  Laparoscopic & open techniques were discussed.   Risks such as bleeding, infection, abscess, leak, reoperation, hernia, heart attack, death, and other risks were discussed.  I noted a good likelihood this will help address the problem.   Goals of post-operative recovery were discussed as well.    The patient expressed understanding & wished to proceed with surgery.  OR FINDINGS:   Patient had mild diverticular disease  DESCRIPTION:   Informed consent was confirmed.  The patient underwent  general anaesthesia without difficulty.  The patient was positioned appropriately.  VTE prevention in place.  The patient's abdomen was clipped, prepped, & draped in a sterile fashion.  Surgical timeout confirmed our plan.  The patient was positioned in reverse Trendelenburg.  Abdominal entry was gained using a Varies needle in the LUQ.  Entry was clean.  I induced carbon dioxide insufflation.  An 23mm robotic port was placed in the RUQ.  Camera inspection revealed no injury.  Extra ports were carefully placed under direct laparoscopic visualization.  I laparoscopically reflected the greater omentum and the upper abdomen the small bowel in the upper abdomen. The patient was appropriately positioned and the robot was docked to the patient's left side.  Instruments were placed under direct visualization.   I scored the base of peritoneum of the right side of the mesentery of the left colon from the ligament of Treitz to the peritoneal reflection of the mid rectum.  The patient had moderate rectal redundancy and moderate diverticular disease of the sigmoid colon.  I elevated the sigmoid mesentery and enetered into the retro-mesenteric plane. We were able to identify the left ureter and gonadal vessels. We kept those posterior within the retroperitoneum and elevated the left colon mesentery off that. I did isolated IMA pedicle but did not ligate it yet.  I continued distally and got into the avascular plane posterior to the mesorectum. This allowed me to help mobilize the rectum as well by freeing the mesorectum off the sacrum.  I mobilized the peritoneal coverings towards the peritoneal reflection on both the right and left  sides of the rectum.  I could see the right and left ureters and stayed away from them.   I continued my dissection down the posterior mesorectal plane to the level of the pelvic floor.  I continued this dissection laterally.  I divided the right side of the peritoneum down to the level of the  peritoneal reflection.  I circumferentially dissected around the rectum at the pelvic floor.  I left the left lateral stalk intact.  I identified the sacral promontory.  I then placed a 2 OV lock suture in the sacral promontory and brought the peritoneal reflection together to this.  The suture was then held into place and an Ethibond suture was placed into the sacral promontory.  There was some pulsatile bleeding that stopped after the suture was tied down.  I then turned my attention to the left side.  I opened up a small amount of peritoneum on the left lateral side making sure to stay away from the ureter.  Identified the sacral promontory on the left side and tacked the peritoneum of the reflection down to the sacral promontory also using a 2 OV lock suture.  I then secured this with a 2-0 Ethibond suture.  The sutures were then cut.  Of note we did have an incorrect suture count and one of the sutures was lost in the abdomen.  We brought in a portable x-ray device and identified this in the right lower quadrant.  This was then removed and a separate x-ray was obtained to confirm removal of the needle from the abdomen.  We placed a 34 [place] Blake drain into the pelvis and brought this out through the right lower quadrant 5 mm port site.  This was secured into place with a 2-0 nylon suture.  The abdomen was inspected laparoscopically.  There was no sign of injury.  Hemostasis was good.  The abdomen was desufflated.  The 8 mm port sites were closed using interrupted 4-0 Vicryl sutures and Dermabond.  The patient was then awakened from anesthesia and sent to the postanesthesia care unit in stable condition.  At the end of the procedure all counts were correct.  An MD assistant was necessary for tissue manipulation, retraction and positioning due to the complexity of the case and hospital policies

## 2019-12-31 ENCOUNTER — Encounter (HOSPITAL_COMMUNITY): Payer: Self-pay | Admitting: General Surgery

## 2019-12-31 LAB — BASIC METABOLIC PANEL
Anion gap: 8 (ref 5–15)
BUN: 13 mg/dL (ref 8–23)
CO2: 25 mmol/L (ref 22–32)
Calcium: 8 mg/dL — ABNORMAL LOW (ref 8.9–10.3)
Chloride: 101 mmol/L (ref 98–111)
Creatinine, Ser: 0.75 mg/dL (ref 0.44–1.00)
GFR, Estimated: >60 mL/min (ref >60)
Glucose, Bld: 177 mg/dL — ABNORMAL HIGH (ref 70–99)
Potassium: 3.9 mmol/L (ref 3.5–5.1)
Sodium: 134 mmol/L — ABNORMAL LOW (ref 135–145)

## 2019-12-31 LAB — CBC
HCT: 37.6 % (ref 36.0–46.0)
Hemoglobin: 12.6 g/dL (ref 12.0–15.0)
MCH: 31.3 pg (ref 26.0–34.0)
MCHC: 33.5 g/dL (ref 30.0–36.0)
MCV: 93.5 fL (ref 80.0–100.0)
Platelets: 233 10*3/uL (ref 150–400)
RBC: 4.02 MIL/uL (ref 3.87–5.11)
RDW: 12 % (ref 11.5–15.5)
WBC: 10.8 10*3/uL — ABNORMAL HIGH (ref 4.0–10.5)
nRBC: 0 % (ref 0.0–0.2)

## 2019-12-31 LAB — URINALYSIS, ROUTINE W REFLEX MICROSCOPIC
Bilirubin Urine: NEGATIVE
Glucose, UA: NEGATIVE mg/dL
Hgb urine dipstick: NEGATIVE
Ketones, ur: NEGATIVE mg/dL
Leukocytes,Ua: NEGATIVE
Nitrite: NEGATIVE
Protein, ur: NEGATIVE mg/dL
Specific Gravity, Urine: 1.016 (ref 1.005–1.030)
pH: 6 (ref 5.0–8.0)

## 2019-12-31 MED ORDER — OXYCODONE-ACETAMINOPHEN 5-325 MG PO TABS
1.0000 | ORAL_TABLET | Freq: Four times a day (QID) | ORAL | Status: DC | PRN
  Administered 2019-12-31 – 2020-01-02 (×4): 1 via ORAL
  Filled 2019-12-31 (×4): qty 1

## 2019-12-31 MED ORDER — OXYCODONE HCL 5 MG PO TABS
5.0000 mg | ORAL_TABLET | Freq: Three times a day (TID) | ORAL | Status: DC | PRN
Start: 2019-12-31 — End: 2019-12-31

## 2019-12-31 MED ORDER — OXYCODONE HCL 5 MG PO TABS
5.0000 mg | ORAL_TABLET | Freq: Four times a day (QID) | ORAL | Status: DC | PRN
  Administered 2019-12-31 – 2020-01-01 (×5): 5 mg via ORAL
  Filled 2019-12-31 (×5): qty 1

## 2019-12-31 MED ORDER — OXYCODONE-ACETAMINOPHEN 5-325 MG PO TABS
1.0000 | ORAL_TABLET | Freq: Four times a day (QID) | ORAL | Status: DC | PRN
Start: 2019-12-31 — End: 2019-12-31

## 2019-12-31 NOTE — Progress Notes (Signed)
1 Day Post-Op   Subjective/Chief Complaint: No BM yet Pain moderately controlled No nausea or vomiting Serosanguinous drain output - 90 cc Clear yellow urine output   Objective: Vital signs in last 24 hours: Temp:  [97.6 F (36.4 C)-98.1 F (36.7 C)] 98.1 F (36.7 C) (11/20 0501) Pulse Rate:  [69-82] 77 (11/20 0501) Resp:  [9-24] 16 (11/20 0501) BP: (126-172)/(68-94) 128/68 (11/20 0501) SpO2:  [93 %-100 %] 96 % (11/20 0501) Weight:  [72.3 kg] 72.3 kg (11/20 0500) Last BM Date: 12/30/19  Intake/Output from previous day: 11/19 0701 - 11/20 0700 In: 2830 [P.O.:480; I.V.:2250; IV Piggyback:100] Out: 2640 [Urine:2250; Drains:90; Blood:300] Intake/Output this shift: No intake/output data recorded.  General appearance: alert, cooperative and no distress GI: soft, minimal distention; incisional tenderness; JP drain with serosanguinous output Incisions c/d/i  Lab Results:  Recent Labs    12/31/19 0546  WBC 10.8*  HGB 12.6  HCT 37.6  PLT 233   BMET Recent Labs    12/31/19 0546  NA 134*  K 3.9  CL 101  CO2 25  GLUCOSE 177*  BUN 13  CREATININE 0.75  CALCIUM 8.0*   PT/INR No results for input(s): LABPROT, INR in the last 72 hours. ABG No results for input(s): PHART, HCO3 in the last 72 hours.  Invalid input(s): PCO2, PO2  Studies/Results: DG Abd 1 View  Result Date: 12/30/2019 CLINICAL DATA:  Needle on prior study EXAM: ABDOMEN - 1 VIEW COMPARISON:  Earlier same day FINDINGS: Curvilinear radiopaque foreign body overlying the right lower quadrant on the prior study is no longer present. A surgical drain remains. Enteric tube tip is in the pyloric region. IMPRESSION: Removal of curvilinear radiopaque foreign body overlying the right lower quadrant. Electronically Signed   By: Macy Mis M.D.   On: 12/30/2019 17:17   DG Abd 1 View  Result Date: 12/30/2019 CLINICAL DATA:  Looking for needle EXAM: ABDOMEN - 1 VIEW COMPARISON:  None. FINDINGS: Surgical drain  is seen in the pelvis. NG tube is in the stomach. There is a curvilinear structure projecting over the right iliac bone compatible with needle. Nonobstructive bowel gas pattern. IMPRESSION: Curvilinear needle projects over the right iliac bone. This was reportedly found by the surgeon and 2nd image performed subsequently. Electronically Signed   By: Rolm Baptise M.D.   On: 12/30/2019 17:16    Anti-infectives: Anti-infectives (From admission, onward)   Start     Dose/Rate Route Frequency Ordered Stop   12/30/19 2200  cefoTEtan (CEFOTAN) 2 g in sodium chloride 0.9 % 100 mL IVPB        2 g 200 mL/hr over 30 Minutes Intravenous Every 12 hours 12/30/19 1606 12/30/19 2308   12/30/19 1045  cefoTEtan (CEFOTAN) 2 g in sodium chloride 0.9 % 100 mL IVPB        2 g 200 mL/hr over 30 Minutes Intravenous On call to O.R. 12/30/19 1035 12/30/19 1316      Assessment/Plan: Rectal prolapse S/P Robot-assisted rectopexy  Continue Foley through POD #2 Leave drain in place Awaiting bowel function; possible discharge tomorrow.  LOS: 1 day    Kimberly Lin 12/31/2019

## 2020-01-01 LAB — CBC
HCT: 38.6 % (ref 36.0–46.0)
Hemoglobin: 12.8 g/dL (ref 12.0–15.0)
MCH: 31.3 pg (ref 26.0–34.0)
MCHC: 33.2 g/dL (ref 30.0–36.0)
MCV: 94.4 fL (ref 80.0–100.0)
Platelets: 219 10*3/uL (ref 150–400)
RBC: 4.09 MIL/uL (ref 3.87–5.11)
RDW: 12.2 % (ref 11.5–15.5)
WBC: 9.3 10*3/uL (ref 4.0–10.5)
nRBC: 0 % (ref 0.0–0.2)

## 2020-01-01 LAB — BASIC METABOLIC PANEL
Anion gap: 10 (ref 5–15)
BUN: 14 mg/dL (ref 8–23)
CO2: 26 mmol/L (ref 22–32)
Calcium: 8.6 mg/dL — ABNORMAL LOW (ref 8.9–10.3)
Chloride: 105 mmol/L (ref 98–111)
Creatinine, Ser: 0.63 mg/dL (ref 0.44–1.00)
GFR, Estimated: >60 mL/min (ref >60)
Glucose, Bld: 156 mg/dL — ABNORMAL HIGH (ref 70–99)
Potassium: 4.1 mmol/L (ref 3.5–5.1)
Sodium: 141 mmol/L (ref 135–145)

## 2020-01-01 NOTE — Progress Notes (Signed)
2 Days Post-Op   Subjective/Chief Complaint: Several large loose stools with no control, no n/v, tol diet, pain ok, feels very "wobbly" with walker   Objective: Vital signs in last 24 hours: Temp:  [98 F (36.7 C)-98.2 F (36.8 C)] 98.2 F (36.8 C) (11/21 0444) Pulse Rate:  [78-88] 88 (11/21 0444) Resp:  [14-17] 17 (11/21 0444) BP: (122-155)/(80-95) 147/80 (11/21 0444) SpO2:  [95 %-100 %] 95 % (11/21 0444) Weight:  [73.9 kg] 73.9 kg (11/21 0500) Last BM Date: 12/30/19  Intake/Output from previous day: 11/20 0701 - 11/21 0700 In: 682.1 [P.O.:660; I.V.:22.1] Out: 2645 [Urine:2525; Drains:120] Intake/Output this shift: No intake/output data recorded.  General appearance: alert, cooperative and no distress GI: soft, nondistended with only incisional tenderness; JP drain with serosanguinous output   Lab Results:  Recent Labs    12/31/19 0546 01/01/20 0514  WBC 10.8* 9.3  HGB 12.6 12.8  HCT 37.6 38.6  PLT 233 219   BMET Recent Labs    12/31/19 0546 01/01/20 0514  NA 134* 141  K 3.9 4.1  CL 101 105  CO2 25 26  GLUCOSE 177* 156*  BUN 13 14  CREATININE 0.75 0.63  CALCIUM 8.0* 8.6*   PT/INR No results for input(s): LABPROT, INR in the last 72 hours. ABG No results for input(s): PHART, HCO3 in the last 72 hours.  Invalid input(s): PCO2, PO2  Studies/Results: DG Abd 1 View  Result Date: 12/30/2019 CLINICAL DATA:  Needle on prior study EXAM: ABDOMEN - 1 VIEW COMPARISON:  Earlier same day FINDINGS: Curvilinear radiopaque foreign body overlying the right lower quadrant on the prior study is no longer present. A surgical drain remains. Enteric tube tip is in the pyloric region. IMPRESSION: Removal of curvilinear radiopaque foreign body overlying the right lower quadrant. Electronically Signed   By: Macy Mis M.D.   On: 12/30/2019 17:17   DG Abd 1 View  Result Date: 12/30/2019 CLINICAL DATA:  Looking for needle EXAM: ABDOMEN - 1 VIEW COMPARISON:  None.  FINDINGS: Surgical drain is seen in the pelvis. NG tube is in the stomach. There is a curvilinear structure projecting over the right iliac bone compatible with needle. Nonobstructive bowel gas pattern. IMPRESSION: Curvilinear needle projects over the right iliac bone. This was reportedly found by the surgeon and 2nd image performed subsequently. Electronically Signed   By: Rolm Baptise M.D.   On: 12/30/2019 17:16    Anti-infectives: Anti-infectives (From admission, onward)   Start     Dose/Rate Route Frequency Ordered Stop   12/30/19 2200  cefoTEtan (CEFOTAN) 2 g in sodium chloride 0.9 % 100 mL IVPB        2 g 200 mL/hr over 30 Minutes Intravenous Every 12 hours 12/30/19 1606 12/30/19 2308   12/30/19 1045  cefoTEtan (CEFOTAN) 2 g in sodium chloride 0.9 % 100 mL IVPB        2 g 200 mL/hr over 30 Minutes Intravenous On call to O.R. 12/30/19 1035 12/30/19 1316      Assessment/Plan: POD 2 rectopexy -foley out and voiding -leave drain one week -I dont think with volume of loose stools and loss control with difficulty moving around ready to go home today, will ask PT to see her -lovenox, scds   Rolm Bookbinder 01/01/2020

## 2020-01-02 LAB — CBC
HCT: 39.8 % (ref 36.0–46.0)
Hemoglobin: 13.3 g/dL (ref 12.0–15.0)
MCH: 31.4 pg (ref 26.0–34.0)
MCHC: 33.4 g/dL (ref 30.0–36.0)
MCV: 94.1 fL (ref 80.0–100.0)
Platelets: 221 10*3/uL (ref 150–400)
RBC: 4.23 MIL/uL (ref 3.87–5.11)
RDW: 12 % (ref 11.5–15.5)
WBC: 9.2 10*3/uL (ref 4.0–10.5)
nRBC: 0 % (ref 0.0–0.2)

## 2020-01-02 LAB — BASIC METABOLIC PANEL
Anion gap: 9 (ref 5–15)
BUN: 11 mg/dL (ref 8–23)
CO2: 27 mmol/L (ref 22–32)
Calcium: 8.9 mg/dL (ref 8.9–10.3)
Chloride: 102 mmol/L (ref 98–111)
Creatinine, Ser: 0.64 mg/dL (ref 0.44–1.00)
GFR, Estimated: >60 mL/min (ref >60)
Glucose, Bld: 139 mg/dL — ABNORMAL HIGH (ref 70–99)
Potassium: 3.7 mmol/L (ref 3.5–5.1)
Sodium: 138 mmol/L (ref 135–145)

## 2020-01-02 MED ORDER — LOPERAMIDE HCL 2 MG PO CAPS
2.0000 mg | ORAL_CAPSULE | ORAL | Status: DC | PRN
  Administered 2020-01-02 (×2): 2 mg via ORAL
  Filled 2020-01-02 (×2): qty 1

## 2020-01-02 NOTE — TOC Transition Note (Signed)
Transition of Care [facility]) - CM/SW Discharge Note   Patient Details  Name: Kimberly Lin MRN: [id number] Date of Birth: May 09, 1945  Transition of Care [facility]) CM/SW Contact:  Lennart Pall, LCSW Phone Number: 01/02/2020, 11:00 AM   Clinical Narrative:    Met with pt about orders in for HHPT and rw.  Pt agreeable and no agency preference.  Referrals place with [facility] and [facility] ( see below).  No further TOC needs.   Final next level of care: [place] Barriers to Discharge: Barriers Resolved   Patient Goals and CMS Choice Patient states their goals for this hospitalization and ongoing recovery are:: go home CMS Medicare.gov Compare Post Acute Care list provided to:: Patient Choice offered to / list presented to : Patient  Discharge Placement                       Discharge Plan and Services                DME Arranged: [place] rolling DME Agency: [facility] Date DME Agency Contacted: 01/02/20 Time DME Agency Contacted: [phone] Representative spoke with at DME Agency: [place] Arranged: PT [place]: [place] Date [place]: 01/02/20 Time [place]: 1059 Representative spoke with at [place]: [place] ([place]) Interventions     Readmission Risk Interventions No flowsheet data found.

## 2020-01-02 NOTE — Evaluation (Addendum)
Physical Therapy Evaluation-1x Patient Details Name: Kimberly Lin MRN: [id number] DOB: 11/30/1945 Today's Date: 01/02/2020   History of Present Illness  74 yo female admitted with rectal prolapse s/p rectopexy 12/30/19. Hx of incontinence, DM, falls, fibromyalgia, anxiety  Clinical Impression  On eval, pt was Supv level with mobility. She walked ~125 feet with use of a RW. Pt reported mild lightheadedness but she was able to participate well. Pt reports she feels unsteady and that she is experiencing radiating "nerve-like" pain in both LEs. Encouraged her to continue to discuss this with her MD if it worsens or doesn't subside. Recommend HHPT f/u for general strength and balance training-made RN aware.    Follow Up Recommendations Home health PT;Supervision for mobility/OOB    Equipment Recommendations  None recommended by PT (pt has a rollator at home)    Recommendations for Other Services       Precautions / Restrictions Precautions Precautions: Fall Restrictions Weight Bearing Restrictions: No      Mobility  Bed Mobility               General bed mobility comments: oob in bathroom    Transfers Overall transfer level: Modified independent                  Ambulation/Gait Ambulation/Gait assistance: Supervision Gait Distance (Feet): 125 Feet Assistive device: Rolling walker (2 wheeled) Gait Pattern/deviations: Step-through pattern;Decreased stride length     General Gait Details: Supervision for safety. Pt also walked a short distance without the RW  Stairs            Wheelchair Mobility    Modified Rankin (Stroke Patients Only)       Balance Overall balance assessment: Needs assistance           Standing balance-Leahy Scale: Fair                               Pertinent Vitals/Pain Pain Assessment: 0-10 Pain Score: 7  Pain Location: bil hips/LEs "nerve-like" per pt Pain Descriptors / Indicators: Radiating Pain  Intervention(s): Monitored during session    Home Living Family/patient expects to be discharged to:: Private residence Living Arrangements: Spouse/significant other Available Help at Discharge: Family Type of Home: House Home Access: Level entry     Home Layout: One level Home Equipment: Environmental consultant - 4 wheels      Prior Function Level of Independence: Independent               Hand Dominance        Extremity/Trunk Assessment   Upper Extremity Assessment Upper Extremity Assessment: Overall WFL for tasks assessed    Lower Extremity Assessment Lower Extremity Assessment: Generalized weakness    Cervical / Trunk Assessment Cervical / Trunk Assessment: Normal  Communication   Communication: No difficulties  Cognition Arousal/Alertness: Awake/alert Behavior During Therapy: WFL for tasks assessed/performed Overall Cognitive Status: Within Functional Limits for tasks assessed                                        General Comments      Exercises     Assessment/Plan    PT Assessment All further PT needs can be met in the next venue of care (HHPT)  PT Problem List Pain;Decreased balance       PT Treatment Interventions  PT Goals (Current goals can be found in the Care Plan section)  Acute Rehab PT Goals Patient Stated Goal: home PT Goal Formulation: All assessment and education complete, DC therapy    Frequency     Barriers to discharge        Co-evaluation               AM-PAC PT "6 Clicks" Mobility  Outcome Measure Help needed turning from your back to your side while in a flat bed without using bedrails?: None Help needed moving from lying on your back to sitting on the side of a flat bed without using bedrails?: None Help needed moving to and from a bed to a chair (including a wheelchair)?: None Help needed standing up from a chair using your arms (e.g., wheelchair or bedside chair)?: None Help needed to walk in  hospital room?: A Little Help needed climbing 3-5 steps with a railing? : A Little 6 Click Score: 22    End of Session   Activity Tolerance: Patient tolerated treatment well Patient left: in chair;with call bell/phone within reach;with nursing/sitter in room   PT Visit Diagnosis: Unsteadiness on feet (R26.81);Pain Pain - Right/Left:  (bil) Pain - part of body: Leg    Time: 1001-1018 PT Time Calculation (min) (ACUTE ONLY): 17 min   Charges:   PT Evaluation $PT Eval Low Complexity: [place], PT Acute Rehabilitation  Office: [phone] Pager: [phone]

## 2020-01-02 NOTE — Discharge Summary (Signed)
Patient ID: Kimberly Lin [id number] 74 y.o. 1946/01/25  12/30/2019  Discharge date and time: 01/02/2020 12:11 PM  Admitting Physician: Rosario Adie  Discharge Physician: Rosario Adie  Admission Diagnoses: Rectal prolapse [K62.3]  Discharge Diagnoses: rectal prolapse  Operations: Procedure(s): XI ROBOT ASSISTED RECTOPEXY    Discharged Condition: good    Hospital Course: Pt admitted after surgery.  Diet was advanced as tolerated.  When she was able to have bowel function and control her pain with oral medications.  She was felt to be in stable condition for discharge to home.  She was seen by physical therapy and noted to have some lower extremity weakness, which is a chronic issue for her.  We will have her continue home physical therapy for this.  Consults: None  Significant Diagnostic Studies: labs: cbc  Treatments: analgesia: Dilaudid and surgery: robotic rectopexy  Disposition: Home

## 2020-01-02 NOTE — Progress Notes (Signed)
D/C instructions given to patient. Patient has no questions. NT or writer will wheel patient out once she is dressed  

## 2020-01-02 NOTE — Care Management Important Message (Signed)
Important Message  Patient Details IM Letter given to the Patient. Name: Kimberly Lin MRN: [id number] Date of Birth: 1946/01/13   Medicare Important Message Given:  Yes     Kerin Salen 01/02/2020, 11:48 AM

## 2020-04-25 ENCOUNTER — Emergency Department (HOSPITAL_BASED_OUTPATIENT_CLINIC_OR_DEPARTMENT_OTHER): Payer: Medicare Other

## 2020-04-25 ENCOUNTER — Other Ambulatory Visit: Payer: Self-pay

## 2020-04-25 ENCOUNTER — Emergency Department (HOSPITAL_BASED_OUTPATIENT_CLINIC_OR_DEPARTMENT_OTHER)
Admission: EM | Admit: 2020-04-25 | Discharge: 2020-04-25 | Disposition: A | Discharge status: Home / Self Care | Payer: Medicare Other | Attending: Emergency Medicine | Admitting: Emergency Medicine

## 2020-04-25 ENCOUNTER — Encounter (HOSPITAL_BASED_OUTPATIENT_CLINIC_OR_DEPARTMENT_OTHER): Payer: Self-pay

## 2020-04-25 DIAGNOSIS — S0101XA Laceration without foreign body of scalp, initial encounter: Secondary | ICD-10-CM | POA: Insufficient documentation

## 2020-04-25 DIAGNOSIS — Z87891 Personal history of nicotine dependence: Secondary | ICD-10-CM | POA: Insufficient documentation

## 2020-04-25 DIAGNOSIS — S0990XA Unspecified injury of head, initial encounter: Secondary | ICD-10-CM

## 2020-04-25 DIAGNOSIS — W01198A Fall on same level from slipping, tripping and stumbling with subsequent striking against other object, initial encounter: Secondary | ICD-10-CM | POA: Insufficient documentation

## 2020-04-25 DIAGNOSIS — Z7984 Long term (current) use of oral hypoglycemic drugs: Secondary | ICD-10-CM | POA: Diagnosis not present

## 2020-04-25 DIAGNOSIS — Z96651 Presence of right artificial knee joint: Secondary | ICD-10-CM | POA: Insufficient documentation

## 2020-04-25 DIAGNOSIS — Z96652 Presence of left artificial knee joint: Secondary | ICD-10-CM | POA: Diagnosis not present

## 2020-04-25 DIAGNOSIS — I10 Essential (primary) hypertension: Secondary | ICD-10-CM | POA: Diagnosis not present

## 2020-04-25 DIAGNOSIS — W19XXXA Unspecified fall, initial encounter: Secondary | ICD-10-CM

## 2020-04-25 DIAGNOSIS — Z79899 Other long term (current) drug therapy: Secondary | ICD-10-CM | POA: Insufficient documentation

## 2020-04-25 DIAGNOSIS — Z7982 Long term (current) use of aspirin: Secondary | ICD-10-CM | POA: Insufficient documentation

## 2020-04-25 DIAGNOSIS — E119 Type 2 diabetes mellitus without complications: Secondary | ICD-10-CM | POA: Insufficient documentation

## 2020-04-25 NOTE — ED Triage Notes (Signed)
Pt states she tripped/fell ~30 min PAT-struck back of head on floor-lac to back of head-no bleeding-4x4 gauze/kling applied-denies neck pain/LOC-denies taking blood thinners-to triage in w/c-NAD

## 2020-04-25 NOTE — ED Notes (Signed)
Patient transported to CT 

## 2020-04-25 NOTE — ED Provider Notes (Signed)
[facility] Provider Note   CSN: [id number] Arrival date & time: 04/25/20  2147     History Chief Complaint  Patient presents with  . Fall    Kimberly Lin is a 75 y.o. female.  HPI Patient presents after fall.  States she tripped and fell.  Hitting the back of her head.  No loss conscious.  Bleeding to the back of her head.  No other injury.  Has been ambulatory since.  No nausea or vomiting.  No confusion.  Not on anticoagulation.  No neck pain.    Past Medical History:  Diagnosis Date  . Abdominal mass   . Abnormal nuclear stress test 07/05/2014  . Acute encephalopathy 03/26/2016  . Anxiety   . Arthritis    osteoarthritis  . Colitis 2014  . Depression   . Diabetes mellitus   . Fibromyalgia   . GERD (gastroesophageal reflux disease)   . Hiatal hernia   . History of chicken pox   . History of septic shock 12/2009  . Hyperlipidemia   . Hypertension   . Migraine   . OSA (obstructive sleep apnea) 03/12/2011  . Personal history of colonic polyps   . Sepsis secondary to UTI ([place]) 03/26/2016  . Spinal stenosis     Patient Active Problem List   Diagnosis Date Noted  . Rectal prolapse 12/30/2019  . Anterior shoulder dislocation, right, initial encounter 10/29/2016  . Falls 03/26/2016  . Anxiety and depression 03/26/2016  . Altered mental status 03/25/2016  . Abnormal nuclear stress test 07/05/2014  . Fatty liver 03/26/2014  . Intertrigo 03/24/2014  . Allergic rhinitis 01/02/2014  . Incontinence, feces 11/10/2013  . Musculoskeletal pain 11/08/2013  . Dysuria 04/16/2013  . Benign paroxysmal positional vertigo 07/19/2012  . Obese 09/17/2011  . Knee pain 09/17/2011  . Weight gain 06/17/2011  . DOE (dyspnea on exertion) 04/15/2011  . OSA (obstructive sleep apnea) 03/12/2011  . Insomnia 01/15/2011  . Diabetes type 2, uncontrolled ([place]) 11/27/2010  . PVC (premature ventricular contraction) 11/27/2010  . Fibromyalgia   . Depression    . GERD (gastroesophageal reflux disease)   . Hyperlipidemia   . Hypertension   . Personal history of colonic polyps   . Migraine     Past Surgical History:  Procedure Laterality Date  . APPENDECTOMY  1983  . BREAST BIOPSY Right 2007, 2009  . CARDIAC CATHETERIZATION N/A 07/05/2014   Procedure: Left Heart Cath and Coronary Angiography;  Surgeon: Sherren Mocha, MD;  Location: [facility];  Service: Cardiovascular;  Laterality: N/A;  . COLONOSCOPY  2014   normal   . ESOPHAGOGASTRODUODENOSCOPY     in [place]  . MEDIAL PARTIAL KNEE REPLACEMENT Bilateral 2006  . OVARIAN CYST REMOVAL  2008   Pt states she had a ?cyst removed ?ovaries  . POLYPECTOMY  1998  . XI ROBOT ASSISTED RECTOPEXY N/A 12/30/2019   Procedure: XI ROBOT ASSISTED RECTOPEXY;  Surgeon: Leighton Ruff, MD;  Location: [facility];  Service: General;  Laterality: N/A;     OB History   No obstetric history on file.     Family History  Problem Relation Age of Onset  . Arthritis Mother   . Hyperlipidemia Mother   . Hypertension Mother   . Diabetes Mother   . Heart disease Mother   . Breast cancer Mother   . Lung cancer Brother   . Heart disease Brother        x 2  . Hypertension Brother   .  Hyperlipidemia Brother   . Diabetes Brother   . Sudden death Father   . Lung cancer Father   . Rectal cancer Father   . Diabetes Sister   . Hyperlipidemia Sister   . Hypertension Sister   . Diabetes Maternal Aunt   . Heart attack Maternal Aunt   . Hyperlipidemia Maternal Aunt   . Hypertension Maternal Aunt   . Hypertension Maternal Uncle   . Hyperlipidemia Maternal Uncle   . Heart attack Maternal Uncle   . Diabetes Maternal Uncle   . Diabetes Paternal Aunt   . Heart attack Paternal Aunt   . Hyperlipidemia Paternal Aunt   . Hypertension Paternal Aunt   . Hypertension Paternal Uncle   . Hyperlipidemia Paternal Uncle   . Heart attack Paternal Uncle   . Diabetes Paternal Uncle   . Prostate cancer Brother   .  Stomach cancer Neg Hx   . Esophageal cancer Neg Hx     Social History   Tobacco Use  . Smoking status: Former Smoker    Types: Cigarettes  . Smokeless tobacco: Never Used  . Tobacco comment: Smoked socially.  Vaping Use  . Vaping Use: Never used  Substance Use Topics  . Alcohol use: Not Currently  . Drug use: No    Home Medications Prior to Admission medications   Medication Sig Start Date End Date Taking? Authorizing Provider  amLODipine (NORVASC) 10 MG tablet Take 1 tablet (10 mg total) by mouth daily. 04/24/17   Debbrah Alar, NP  aspirin 81 MG tablet Take 81 mg by mouth daily.      [provider]  atorvastatin (LIPITOR) 40 MG tablet Take 1 tablet (40 mg total) by mouth daily. 04/24/17   Debbrah Alar, NP  balsalazide (COLAZAL) 750 MG capsule Take 2,250 mg by mouth in the morning and at bedtime.    [provider]  bimatoprost (LUMIGAN) 0.01 % SOLN Place 1 drop into both eyes at bedtime.     [provider]  brimonidine (ALPHAGAN P) 0.1 % SOLN Place 1 drop into both eyes in the morning and at bedtime.     [provider]  budesonide (ENTOCORT EC) 3 MG 24 hr capsule Take 6 mg by mouth daily.    [provider]  Calcium Carbonate-Vitamin D (CALCIUM-D) 600-400 MG-UNIT TABS Take 1 tablet by mouth daily.     [provider]  Coenzyme Q10 (CO Q 10) 100 MG CAPS Take 100 mg by mouth daily.    [provider]  dicyclomine (BENTYL) 20 MG tablet Take 1 tablet (20 mg total) by mouth 2 (two) times daily. Patient not taking: Reported on 12/30/2019 08/21/17   Palumbo, April, MD  diphenhydrAMINE (BENADRYL) 25 MG tablet Take 25 mg by mouth every 6 (six) hours as needed for itching.     [provider]  escitalopram (LEXAPRO) 20 MG tablet Take 1 tablet (20 mg total) by mouth daily. 09/19/16   Debbrah Alar, NP  liraglutide (VICTOZA) 18 MG/3ML SOPN Inject 1.8mg  once daily Patient not taking: Reported on  12/20/2019 03/17/17   Debbrah Alar, NP  Magnesium Oxide 400 (240 Mg) MG TABS Take 1 tablet by mouth daily. Patient not taking: Reported on 12/20/2019 11/08/15   Debbrah Alar, NP  meclizine (ANTIVERT) 25 MG tablet Take 1 tablet (25 mg total) by mouth 3 (three) times daily as needed for dizziness. 01/01/16   Debbrah Alar, NP  metFORMIN (GLUCOPHAGE) 1000 MG tablet Take 1 tablet (1,000 mg total) by mouth  2 (two) times daily with a meal. 02/23/17   Debbrah Alar, NP  metoprolol succinate (TOPROL-XL) 50 MG 24 hr tablet Take 1 tablet (50 mg total) by mouth daily. Take with or immediately following a meal. Patient taking differently: Take 50 mg by mouth daily.  04/24/17   Debbrah Alar, NP  metoprolol tartrate (LOPRESSOR) 25 MG tablet Take 25 mg by mouth daily.    [provider]  Multiple Vitamin (MULTIVITAMIN) tablet Take 1 tablet by mouth daily.      [provider]  nystatin-triamcinolone (MYCOLOG II) cream Apply 1 application topically daily.    [provider]  Omega-3 Fatty Acids (FISH OIL) 1000 MG CAPS 2 caps by mouth twice daily Patient taking differently: Take 2,000 mg by mouth 2 (two) times daily.  04/16/11   Debbrah Alar, NP  omeprazole (PRILOSEC) 40 MG capsule Take 40 mg by mouth daily.    [provider]  ondansetron (ZOFRAN ODT) 8 MG disintegrating tablet 8mg  ODT q12 hours prn nausea Patient not taking: Reported on 12/30/2019 08/21/17   Palumbo, April, MD  oxyCODONE-acetaminophen (PERCOCET) 10-325 MG tablet Take 1 tablet by mouth every 8 (eight) hours as needed for pain.     [provider]  pantoprazole (PROTONIX) 40 MG tablet Take 1 tablet (40 mg total) by mouth at bedtime. 02/23/17   Debbrah Alar, NP  Probiotic Product (PROBIOTIC COMPLEX ACIDOPHILUS) CAPS Take 1 capsule by mouth 4 (four) times daily - after meals and at bedtime. 08/21/17   Palumbo, April, MD  Semaglutide,0.25 or 0.5MG /DOS, (OZEMPIC, 0.25 OR  0.5 MG/DOSE,) 2 MG/1.5ML SOPN Inject 0.5 mg into the skin every Wednesday.     [provider]  sertraline (ZOLOFT) 50 MG tablet Take 50 mg by mouth daily.    [provider]  traZODone (DESYREL) 50 MG tablet Take 50 mg by mouth at bedtime.    [provider]  venlafaxine XR (EFFEXOR-XR) 75 MG 24 hr capsule TAKE TWO CAPSULES BY MOUTH ONCE DAILY Patient taking differently: Take 150 mg by mouth daily.  04/24/17   Debbrah Alar, NP  zolpidem (AMBIEN) 5 MG tablet Take 10 mg by mouth at bedtime as needed for sleep.    [provider]    Allergies    Erythromycin, Losartan, and Prednisone  Review of Systems   Review of Systems  Constitutional: Negative for appetite change.  Respiratory: Negative for shortness of breath.   Gastrointestinal: Negative for anal bleeding.  Genitourinary: Negative for flank pain.  Musculoskeletal: Negative for back pain.  Skin: Positive for wound.  Neurological: Negative for weakness and headaches.  Psychiatric/Behavioral: Negative for confusion.    Physical Exam Updated Vital Signs BP (!) 160/84 (BP Location: Right Arm)   Pulse 77   Temp 98.4 F (36.9 C) (Oral)   Resp 20   Ht 5\' 3"  (1.6 m)   Wt 68 kg   SpO2 94%   BMI 26.57 kg/m   Physical Exam Vitals and nursing note reviewed.  Constitutional:      Appearance: Normal appearance.  HENT:     Head: Normocephalic.     Comments: Approximately 1/2 cm laceration to occipital area.    Right Ear: External ear normal.     Left Ear: External ear normal.  Eyes:     Extraocular Movements: Extraocular movements intact.     Pupils: Pupils are equal, round, and reactive to light.  Cardiovascular:     Rate and Rhythm: Regular rhythm.  Pulmonary:  Breath sounds: No wheezing or rhonchi.  Abdominal:     Tenderness: There is no abdominal tenderness.  Musculoskeletal:        General: No tenderness.     Cervical back: Neck supple. No tenderness.  Skin:    General:  Skin is warm.     Capillary Refill: Capillary refill takes less than 2 seconds.  Neurological:     Mental Status: She is alert and oriented to person, place, and time.     ED Results / Procedures / Treatments   Labs (all labs ordered are listed, but only abnormal results are displayed) Labs Reviewed - No data to display  EKG None  Radiology CT Head Wo Contrast  Result Date: 04/25/2020 CLINICAL DATA:  Tripped and fell, hit back of head EXAM: CT HEAD WITHOUT CONTRAST TECHNIQUE: Contiguous axial images were obtained from the base of the skull through the vertex without intravenous contrast. COMPARISON:  04/02/2019 FINDINGS: Brain: No acute infarct or hemorrhage. Stable chronic small vessel ischemic changes within the periventricular white matter. Lateral ventricles and remaining midline structures are unremarkable. No acute extra-axial fluid collections. No mass effect. Vascular: No hyperdense vessel or unexpected calcification. Skull: Small left occipital scalp laceration. Negative for fracture or focal lesion. Sinuses/Orbits: No acute finding. Other: None. IMPRESSION: 1. Small left occipital scalp laceration. 2. No acute intracranial process. Electronically Signed   By: Randa Ngo M.D.   On: 04/25/2020 22:38    Procedures .Marland KitchenLaceration Repair  Date/Time: 04/25/2020 10:52 PM Performed by: Davonna Belling, MD Authorized by: Davonna Belling, MD   Consent:    Consent obtained:  Verbal   Consent given by:  Patient   Risks, benefits, and alternatives were discussed: yes     Risks discussed:  Infection, pain, retained foreign body, need for additional repair, poor cosmetic result and poor wound healing   Alternatives discussed:  No treatment and delayed treatment Universal protocol:    Patient identity confirmed:  Verbally with patient Anesthesia:    Anesthesia method:  None Laceration details:    Location:  Scalp   Scalp location:  Occipital   Length (cm):  1.5 Pre-procedure  details:    Preparation:  Patient was prepped and draped in usual sterile fashion and imaging obtained to evaluate for foreign bodies Exploration:    Limited defect created (wound extended): no     Imaging outcome: foreign body not noted     Wound exploration: wound explored through full range of motion     Contaminated: no   Treatment:    Area cleansed with:  Shur-Clens   Amount of cleaning:  Standard   Debridement:  None Skin repair:    Repair method:  Staples   Number of staples:  3 Approximation:    Approximation:  Close Repair type:    Repair type:  Simple Post-procedure details:    Dressing:  Open (no dressing)   Procedure completion:  Tolerated well, no immediate complications     Medications Ordered in ED Medications - No data to display  ED Course  I have reviewed the triage vital signs and the nursing notes.  Pertinent labs & imaging results that were available during my care of the patient were reviewed by me and considered in my medical decision making (see chart for details).    MDM Rules/Calculators/A&P                          Patient with fall.  Mechanical.  Scalp laceration closed with staples.  No other apparent injury.  Cervical spine clinically cleared.  Tetanus is up-to-date.  Will discharge home.  Staple removal in 7 to 10 days.   Final Clinical Impression(s) / ED Diagnoses Final diagnoses:  Fall, initial encounter  Scalp laceration, initial encounter  Injury of head, initial encounter    Rx / DC Orders ED Discharge Orders    None       Davonna Belling, MD 04/25/20 2254

## 2020-04-25 NOTE — Discharge Instructions (Addendum)
Have the staples taken out in around 7 to 10 days.

## 2020-07-18 ENCOUNTER — Other Ambulatory Visit (HOSPITAL_BASED_OUTPATIENT_CLINIC_OR_DEPARTMENT_OTHER): Payer: Self-pay | Admitting: Family Medicine

## 2020-07-18 DIAGNOSIS — Z1231 Encounter for screening mammogram for malignant neoplasm of breast: Secondary | ICD-10-CM

## 2020-08-16 ENCOUNTER — Other Ambulatory Visit: Payer: Self-pay

## 2020-08-16 ENCOUNTER — Emergency Department (HOSPITAL_BASED_OUTPATIENT_CLINIC_OR_DEPARTMENT_OTHER): Payer: Medicare Other

## 2020-08-16 ENCOUNTER — Encounter (HOSPITAL_BASED_OUTPATIENT_CLINIC_OR_DEPARTMENT_OTHER): Payer: Self-pay | Admitting: *Deleted

## 2020-08-16 ENCOUNTER — Emergency Department (HOSPITAL_BASED_OUTPATIENT_CLINIC_OR_DEPARTMENT_OTHER)
Admission: EM | Admit: 2020-08-16 | Discharge: 2020-08-16 | Disposition: A | Discharge status: Home / Self Care | Payer: Medicare Other | Attending: Emergency Medicine | Admitting: Emergency Medicine

## 2020-08-16 DIAGNOSIS — M545 Low back pain, unspecified: Secondary | ICD-10-CM

## 2020-08-16 DIAGNOSIS — S22060A Wedge compression fracture of T7-T8 vertebra, initial encounter for closed fracture: Secondary | ICD-10-CM | POA: Diagnosis not present

## 2020-08-16 DIAGNOSIS — I10 Essential (primary) hypertension: Secondary | ICD-10-CM | POA: Diagnosis not present

## 2020-08-16 DIAGNOSIS — S32050A Wedge compression fracture of fifth lumbar vertebra, initial encounter for closed fracture: Secondary | ICD-10-CM | POA: Diagnosis not present

## 2020-08-16 DIAGNOSIS — Z96653 Presence of artificial knee joint, bilateral: Secondary | ICD-10-CM | POA: Diagnosis not present

## 2020-08-16 DIAGNOSIS — Z7984 Long term (current) use of oral hypoglycemic drugs: Secondary | ICD-10-CM | POA: Diagnosis not present

## 2020-08-16 DIAGNOSIS — Z87891 Personal history of nicotine dependence: Secondary | ICD-10-CM | POA: Diagnosis not present

## 2020-08-16 DIAGNOSIS — Z79899 Other long term (current) drug therapy: Secondary | ICD-10-CM | POA: Diagnosis not present

## 2020-08-16 DIAGNOSIS — W010XXA Fall on same level from slipping, tripping and stumbling without subsequent striking against object, initial encounter: Secondary | ICD-10-CM | POA: Insufficient documentation

## 2020-08-16 DIAGNOSIS — S3992XA Unspecified injury of lower back, initial encounter: Secondary | ICD-10-CM | POA: Diagnosis present

## 2020-08-16 DIAGNOSIS — Z7982 Long term (current) use of aspirin: Secondary | ICD-10-CM | POA: Insufficient documentation

## 2020-08-16 DIAGNOSIS — E119 Type 2 diabetes mellitus without complications: Secondary | ICD-10-CM | POA: Insufficient documentation

## 2020-08-16 DIAGNOSIS — W19XXXA Unspecified fall, initial encounter: Secondary | ICD-10-CM

## 2020-08-16 MED ORDER — LIDOCAINE 5 % EX PTCH: 1.0000 | MEDICATED_PATCH | CUTANEOUS | 0 refills | Status: DC

## 2020-08-16 MED ORDER — METHOCARBAMOL 500 MG PO TABS: 500.0000 mg | ORAL_TABLET | Freq: Two times a day (BID) | ORAL | 0 refills | Status: DC

## 2020-08-16 NOTE — Discharge Instructions (Addendum)
Follow up with Neurosurgery  Return for new or worsening symptoms

## 2020-08-16 NOTE — ED Notes (Signed)
Pt. Reports she fell on hard tile floor and now feels pain in her back and is using a walker to get around.  Pt. Reports she is having pain that she has not had in past.

## 2020-08-16 NOTE — ED Notes (Signed)
[facility] made aware of need of TLSO brace [phone], unable to advise of time of arrival, ED PA explained delay to patient.

## 2020-08-16 NOTE — ED Notes (Signed)
Charge RN calling to get TSLO for Pt. At this time

## 2020-08-16 NOTE — ED Provider Notes (Signed)
[facility] Provider Note   CSN: [id number] Arrival date & time: 08/16/20  1255     History Chief Complaint  Patient presents with   Back Pain   Fall    Kimberly Lin is a 75 y.o. female with past medical history significant for spinal stenosis, chronic back pain, diabetes, chronic UTI who presents for evaluation of fall.  Patient states she is followed by neurosurgery for chronic back pain.  Had a spinal injection in June.  Had a fall after that.  Has had persistent back pain.  Patient states on Sunday, 4 days ago she turned backwards quickly, lost her footing and fell again on her lower back.  She denies hitting her head, LOC or anticoagulation.  Has had continued back pain since then, worse with movement.  She denies any bowel or bladder incontinence, saddle paresthesia, lower extremity weakness or numbness.  States she had been given oxycodone by PCP which she typically does not have to take however she has had to take this since Sunday due to the pain.  She took a pill just prior to arrival and does not want additional medication currently.  States she was told eventually she would need surgery on her back if the pain persistent despite the injection.  No fever, chills, emesis, blurred vision, chest pain, abdominal pain, syncope, numbness or tingling.  Rates her current pain an 8/10.  She has been using a walker over the last month due to her back pain.  Denies additional aggravating or alleviating factors.  Apparently had MR done at outside facility a few months ago however I do not see records of this in [place].  Had been on prednisone previously however cannot tolerate this medication.  History obtained from patient and past medical records.  No interpreter used.  HPI     Past Medical History:  Diagnosis Date   Abdominal mass    Abnormal nuclear stress test 07/05/2014   Acute encephalopathy 03/26/2016   Anxiety    Arthritis    osteoarthritis    Colitis 2014   Depression    Diabetes mellitus    Fibromyalgia    GERD (gastroesophageal reflux disease)    Hiatal hernia    History of chicken pox    History of septic shock 12/2009   Hyperlipidemia    Hypertension    Migraine    OSA (obstructive sleep apnea) 03/12/2011   Personal history of colonic polyps    Sepsis secondary to UTI ([place]) 03/26/2016   Spinal stenosis     Patient Active Problem List   Diagnosis Date Noted   Rectal prolapse 12/30/2019   Anterior shoulder dislocation, right, initial encounter 10/29/2016   Falls 03/26/2016   Anxiety and depression 03/26/2016   Altered mental status 03/25/2016   Abnormal nuclear stress test 07/05/2014   Fatty liver 03/26/2014   Intertrigo 03/24/2014   Allergic rhinitis 01/02/2014   Incontinence, feces 11/10/2013   Musculoskeletal pain 11/08/2013   Dysuria 04/16/2013   Benign paroxysmal positional vertigo 07/19/2012   Obese 09/17/2011   Knee pain 09/17/2011   Weight gain 06/17/2011   DOE (dyspnea on exertion) 04/15/2011   OSA (obstructive sleep apnea) 03/12/2011   Insomnia 01/15/2011   Diabetes type 2, uncontrolled ([place]) 11/27/2010   PVC (premature ventricular contraction) 11/27/2010   Fibromyalgia    Depression    GERD (gastroesophageal reflux disease)    Hyperlipidemia    Hypertension    Personal history of colonic polyps  Migraine     Past Surgical History:  Procedure Laterality Date   APPENDECTOMY  1983   BREAST BIOPSY Right 2007, 2009   CARDIAC CATHETERIZATION N/A 07/05/2014   Procedure: Left Heart Cath and Coronary Angiography;  Surgeon: Sherren Mocha, MD;  Location: [facility];  Service: Cardiovascular;  Laterality: N/A;   COLONOSCOPY  2014   normal    ESOPHAGOGASTRODUODENOSCOPY     in [place] Bilateral 2006   OVARIAN CYST REMOVAL  2008   Pt states she had a ?cyst removed ?ovaries   POLYPECTOMY  1998   XI ROBOT ASSISTED RECTOPEXY N/A 12/30/2019   Procedure:  XI ROBOT ASSISTED RECTOPEXY;  Surgeon: Leighton Ruff, MD;  Location: [facility];  Service: General;  Laterality: N/A;     OB History   No obstetric history on file.     Family History  Problem Relation Age of Onset   Arthritis Mother    Hyperlipidemia Mother    Hypertension Mother    Diabetes Mother    Heart disease Mother    Breast cancer Mother    Lung cancer Brother    Heart disease Brother        x 2   Hypertension Brother    Hyperlipidemia Brother    Diabetes Brother    Sudden death Father    Lung cancer Father    Rectal cancer Father    Diabetes Sister    Hyperlipidemia Sister    Hypertension Sister    Diabetes Maternal Aunt    Heart attack Maternal Aunt    Hyperlipidemia Maternal Aunt    Hypertension Maternal Aunt    Hypertension Maternal Uncle    Hyperlipidemia Maternal Uncle    Heart attack Maternal Uncle    Diabetes Maternal Uncle    Diabetes Paternal Aunt    Heart attack Paternal Aunt    Hyperlipidemia Paternal Aunt    Hypertension Paternal Aunt    Hypertension Paternal Uncle    Hyperlipidemia Paternal Uncle    Heart attack Paternal Uncle    Diabetes Paternal Uncle    Prostate cancer Brother    Stomach cancer Neg Hx    Esophageal cancer Neg Hx     Social History   Tobacco Use   Smoking status: Former    Pack years: 0.00    Types: Cigarettes   Smokeless tobacco: Never   Tobacco comments:    Smoked socially.  Vaping Use   Vaping Use: Never used  Substance Use Topics   Alcohol use: Not Currently   Drug use: No    Home Medications Prior to Admission medications   Medication Sig Start Date End Date Taking? Authorizing Provider  lidocaine (LIDODERM) 5 % Place 1 patch onto the skin daily. Remove & Discard patch within 12 hours or as directed by MD 08/16/20  Yes Ilya Ess A, PA-C  methocarbamol (ROBAXIN) 500 MG tablet Take 1 tablet (500 mg total) by mouth 2 (two) times daily. 08/16/20  Yes Arryn Terrones A, PA-C  amLODipine (NORVASC) 10 MG  tablet Take 1 tablet (10 mg total) by mouth daily. 04/24/17   Debbrah Alar, NP  aspirin 81 MG tablet Take 81 mg by mouth daily.      [provider]  atorvastatin (LIPITOR) 40 MG tablet Take 1 tablet (40 mg total) by mouth daily. 04/24/17   Debbrah Alar, NP  balsalazide (COLAZAL) 750 MG capsule Take 2,250 mg by mouth in the morning and at bedtime.  [provider]  bimatoprost (LUMIGAN) 0.01 % SOLN Place 1 drop into both eyes at bedtime.     [provider]  brimonidine (ALPHAGAN P) 0.1 % SOLN Place 1 drop into both eyes in the morning and at bedtime.     [provider]  budesonide (ENTOCORT EC) 3 MG 24 hr capsule Take 6 mg by mouth daily.    [provider]  Calcium Carbonate-Vitamin D (CALCIUM-D) 600-400 MG-UNIT TABS Take 1 tablet by mouth daily.     [provider]  Coenzyme Q10 (CO Q 10) 100 MG CAPS Take 100 mg by mouth daily.    [provider]  dicyclomine (BENTYL) 20 MG tablet Take 1 tablet (20 mg total) by mouth 2 (two) times daily. Patient not taking: Reported on 12/30/2019 08/21/17   Palumbo, April, MD  diphenhydrAMINE (BENADRYL) 25 MG tablet Take 25 mg by mouth every 6 (six) hours as needed for itching.     [provider]  escitalopram (LEXAPRO) 20 MG tablet Take 1 tablet (20 mg total) by mouth daily. 09/19/16   Debbrah Alar, NP  liraglutide (VICTOZA) 18 MG/3ML SOPN Inject 1.8mg  once daily Patient not taking: Reported on 12/20/2019 03/17/17   Debbrah Alar, NP  Magnesium Oxide 400 (240 Mg) MG TABS Take 1 tablet by mouth daily. Patient not taking: Reported on 12/20/2019 11/08/15   Debbrah Alar, NP  meclizine (ANTIVERT) 25 MG tablet Take 1 tablet (25 mg total) by mouth 3 (three) times daily as needed for dizziness. 01/01/16   Debbrah Alar, NP  metFORMIN (GLUCOPHAGE) 1000 MG tablet Take 1 tablet (1,000 mg total) by mouth 2 (two) times daily with a meal. 02/23/17   Debbrah Alar,  NP  metoprolol succinate (TOPROL-XL) 50 MG 24 hr tablet Take 1 tablet (50 mg total) by mouth daily. Take with or immediately following a meal. Patient taking differently: Take 50 mg by mouth daily.  04/24/17   Debbrah Alar, NP  metoprolol tartrate (LOPRESSOR) 25 MG tablet Take 25 mg by mouth daily.    [provider]  Multiple Vitamin (MULTIVITAMIN) tablet Take 1 tablet by mouth daily.      [provider]  nystatin-triamcinolone (MYCOLOG II) cream Apply 1 application topically daily.    [provider]  Omega-3 Fatty Acids (FISH OIL) 1000 MG CAPS 2 caps by mouth twice daily Patient taking differently: Take 2,000 mg by mouth 2 (two) times daily.  04/16/11   Debbrah Alar, NP  omeprazole (PRILOSEC) 40 MG capsule Take 40 mg by mouth daily.    [provider]  ondansetron (ZOFRAN ODT) 8 MG disintegrating tablet 8mg  ODT q12 hours prn nausea Patient not taking: Reported on 12/30/2019 08/21/17   Palumbo, April, MD  oxyCODONE-acetaminophen (PERCOCET) 10-325 MG tablet Take 1 tablet by mouth every 8 (eight) hours as needed for pain.     [provider]  pantoprazole (PROTONIX) 40 MG tablet Take 1 tablet (40 mg total) by mouth at bedtime. 02/23/17   Debbrah Alar, NP  Probiotic Product (PROBIOTIC COMPLEX ACIDOPHILUS) CAPS Take 1 capsule by mouth 4 (four) times daily - after meals and at bedtime. 08/21/17   Palumbo, April, MD  Semaglutide,0.25 or 0.5MG /DOS, (OZEMPIC, 0.25 OR 0.5 MG/DOSE,) 2 MG/1.5ML SOPN Inject 0.5 mg into the skin every Wednesday.     [provider]  sertraline (ZOLOFT) 50 MG tablet Take 50 mg by mouth daily.    [provider]  traZODone (DESYREL) 50 MG tablet Take 50 mg by mouth at  bedtime.    [provider]  venlafaxine XR (EFFEXOR-XR) 75 MG 24 hr capsule TAKE TWO CAPSULES BY MOUTH ONCE DAILY Patient taking differently: Take 150 mg by mouth daily.  04/24/17   Debbrah Alar, NP  zolpidem (AMBIEN) 5  MG tablet Take 10 mg by mouth at bedtime as needed for sleep.    [provider]    Allergies    Erythromycin, Losartan, and Prednisone  Review of Systems   Review of Systems  Constitutional: Negative.   HENT: Negative.    Respiratory: Negative.    Cardiovascular: Negative.   Gastrointestinal: Negative.   Genitourinary: Negative.   Musculoskeletal:  Positive for back pain and gait problem. Negative for neck pain and neck stiffness.       Midline thoracic, lumbar pain. Left posterior pelvic/ hip pain  Skin:  Negative for wound.  All other systems reviewed and are negative.  Physical Exam Updated Vital Signs BP (!) 150/110   Pulse 66   Temp 98 F (36.7 C) (Oral)   Resp 18   Ht 5\' 3"  (1.6 m)   Wt 68 kg   SpO2 96%   BMI 26.56 kg/m   Physical Exam Physical Exam  Constitutional: Pt appears well-developed and well-nourished. No distress.  HENT:  Head: Normocephalic and atraumatic.  Mouth/Throat: Oropharynx is clear and moist. No oropharyngeal exudate.  Eyes: Conjunctivae are normal.  Neck: Normal range of motion. Neck supple.  Full ROM without pain  Cardiovascular: Normal rate, regular rhythm and intact distal pulses.   Pulmonary/Chest: Effort normal and breath sounds normal. No respiratory distress. Pt has no wheezes.  Abdominal: Soft. Pt exhibits no distension. There is no tenderness, rebound or guarding. No abd bruit or pulsatile mass Musculoskeletal:  Full range of motion of the T-spine and L-spine with flexion, hyperextension, and lateral flexion. No midline tenderness or stepoffs. No tenderness to palpation of the spinous processes of the T-spine or L-spine. Diffuse midline tenderness to palpation of the paraspinous muscles of the lower T spine and L-spine. Positive straight leg raise on left. Lymphadenopathy:    Pt has no cervical adenopathy.  Neurological: Pt is alert. Pt has normal reflexes.  Speech is clear and goal oriented, follows commands Normal 5/5  strength in upper and lower extremities bilaterally including dorsiflexion and plantar flexion, strong and equal grip strength Sensation normal to light and sharp touch Moves extremities without ataxia, coordination intact Walks with walker Skin: Skin is warm and dry. No rash noted or lesions noted. Pt is not diaphoretic. No erythema, ecchymosis,edema or warmth.  Psychiatric: Pt has a normal mood and affect. Behavior is normal.  Nursing note and vitals reviewed.  ED Results / Procedures / Treatments   Labs (all labs ordered are listed, but only abnormal results are displayed) Labs Reviewed - No data to display  EKG None  Radiology CT Thoracic Spine Wo Contrast  Result Date: 08/16/2020 CLINICAL DATA:  Fall, midline back pain EXAM: CT THORACIC SPINE WITHOUT CONTRAST TECHNIQUE: Multidetector CT images of the thoracic were obtained using the standard protocol without intravenous contrast. COMPARISON:  Chest CT 04/02/2019 FINDINGS: Alignment: Mild upper thoracic leftward curvature. Vertebrae: There is a mild central compression deformity along the right aspect of the T8 vertebral body (sagittal image 31). There is a somewhat similar appearance on prior chest CT in February 2021, this is likely chronic. There is no evidence of new compression fracture. Paraspinal and other soft tissues: Bibasilar hypoventilatory changes. Thoracic aortic atherosclerotic calcifications. Disc levels: There are  multilevel degenerative disc changes, worst at T8-T9. There is multilevel facet arthritis. IMPRESSION: Likely chronic mild T8 compression deformity. No definite acute thoracic spine fracture. Aortic Atherosclerosis (ICD10-I70.0). Electronically Signed   By: Maurine Simmering   On: 08/16/2020 16:16   CT LUMBAR SPINE WO CONTRAST  Result Date: 08/16/2020 CLINICAL DATA:  Back pain, fall EXAM: CT LUMBAR SPINE WITHOUT CONTRAST TECHNIQUE: Multidetector CT imaging of the lumbar spine was performed without intravenous contrast  administration. Multiplanar CT image reconstructions were also generated. COMPARISON:  CT 04/02/2019 FINDINGS: Segmentation: 5 lumbar type vertebrae. Alignment: Trace retrolisthesis at L2-L3 and L3-L4. Trace grade 1 anterolisthesis at L4-L5 and L5-S1. Vertebrae: There is a compression deformity of L5 with approximately 15% height loss. Paraspinal and other soft tissues: Sigmoid diverticulosis. Aorta bi-iliac atherosclerotic calcifications. Disc levels: There is multilevel degenerative disc disease, most severe at L2-L3. There are prominent posterior disc bulges throughout the lumbar spine and multilevel facet arthritis which is most severe at L4-L5 and L5-S1. There is no aggressive bone lesion. IMPRESSION: L5 compression deformity with approximately 15% height loss. Multilevel degenerative changes of lumbar spine as described above. Electronically Signed   By: Maurine Simmering   On: 08/16/2020 16:28   CT PELVIS WO CONTRAST  Result Date: 08/16/2020 CLINICAL DATA:  Pelvic trauma, fall, midline back pain EXAM: CT PELVIS WITHOUT CONTRAST TECHNIQUE: Multidetector CT imaging of the pelvis was performed following the standard protocol without intravenous contrast. COMPARISON:  None. FINDINGS: Urinary Tract:  No abnormality visualized. Bowel:  Sigmoid diverticulosis. Vascular/Lymphatic: Aortoiliac atherosclerotic calcifications. Reproductive:  Unremarkable. Other:  None. Musculoskeletal: There is no evidence of pelvic ring or femoral neck fracture. There is a compression deformity of L5 with approximately 15% height loss. There is no bony retropulsion. Severe L4-5 and L5-S1 facet arthritis. There is mild bilateral hip osteoarthritis. IMPRESSION: L5 compression deformity with approximately 15% height loss. No evidence of pelvic ring or femoral neck fracture. Electronically Signed   By: Maurine Simmering   On: 08/16/2020 16:24    Procedures .Splint Application  Date/Time: 08/16/2020 6:18 PM Performed by: Nettie Elm,  PA-C Authorized by: Nettie Elm, PA-C   Consent:    Consent obtained:  Verbal   Consent given by:  Patient   Risks, benefits, and alternatives were discussed: yes     Risks discussed:  Discoloration, numbness, pain and swelling   Alternatives discussed:  No treatment, referral, observation, alternative treatment and delayed treatment Universal protocol:    Procedure explained and questions answered to patient or proxy's satisfaction: yes     Relevant documents present and verified: yes     Test results available: yes     Imaging studies available: yes     Required blood products, implants, devices, and special equipment available: yes     Site/side marked: yes     Immediately prior to procedure a time out was called: yes     Patient identity confirmed:  Verbally with patient Pre-procedure details:    Distal neurologic exam:  Normal   Distal perfusion: distal pulses strong and brisk capillary refill   Procedure details:    Location: back.   Strapping: no     Attestation: Splint applied and adjusted personally by me   Post-procedure details:    Distal neurologic exam:  Normal   Distal perfusion: distal pulses strong     Procedure completion:  Tolerated well, no immediate complications   Post-procedure imaging: not applicable   Comments:     TLSO brace  Medications Ordered in ED Medications - No data to display  ED Course  I have reviewed the triage vital signs and the nursing notes.  Pertinent labs & imaging results that were available during my care of the patient were reviewed by me and considered in my medical decision making (see chart for details).  75 year old here for evaluation of mechanical fall.  Patient has history of chronic back pain followed by neurosurgery.  She is afebrile, nonseptic, not ill-appearing.  Denies any head, LOC or anticoagulation.  She has diffuse tenderness to her lower thoracic and lumbar region as well as some mild tenderness to her  posterior left hip/pelvic region.  Her heart and lungs are clear.  Abdomen soft, nontender.  Low suspicion for acute intrathoracic, abdominal trauma.  She has no bowel or bladder incontinence, saddle paresthesia.  Low suspicion for cauda equina.  She has been ambulating with a walker over the last month due to her chronic back pain.  We will plan on CT and reassess  CT pelvis wo acute findings CT L Spine with L5 compression fracture CT T spine with possible chronic T8 compression fracture  Patient reassessed.  Has not needed anything for pain here in the emergency department.  Discussed her labs.  She will be placed in a TLSO brace and will follow up with neurosurgery.  She has pain management with her PCP.  We will add on some muscle relaxers, lidocaine patches.  She is ambulatory without difficulty with her walker.  Patient had TLSO brace placed.  Discussed close follow-up.  She is agreeable. Low suspicion for acute neurosurgical emergency such as cauda equina, discitis, osteomyelitis, transverse myelitis, psoas abscess.  The patient has been appropriately medically screened and/or stabilized in the ED. I have low suspicion for any other emergent medical condition which would require further screening, evaluation or treatment in the ED or require inpatient management.  Patient is hemodynamically stable and in no acute distress.  Patient able to ambulate in department prior to ED.  Evaluation does not show acute pathology that would require ongoing or additional emergent interventions while in the emergency department or further inpatient treatment.  I have discussed the diagnosis with the patient and answered all questions.  Pain is been managed while in the emergency department and patient has no further complaints prior to discharge.  Patient is comfortable with plan discussed in room and is stable for discharge at this time.  I have discussed strict return precautions for returning to the emergency  department.  Patient was encouraged to follow-up with PCP/specialist refer to at discharge.        MDM Rules/Calculators/A&P                           Final Clinical Impression(s) / ED Diagnoses Final diagnoses:  Low back pain  Compression fracture of L5 vertebra, initial encounter ([place])  Closed wedge compression fracture of T8 vertebra, initial encounter ([place])  Fall, initial encounter    Rx / DC Orders ED Discharge Orders          Ordered    methocarbamol (ROBAXIN) 500 MG tablet  2 times daily        08/16/20 1812    lidocaine (LIDODERM) 5 %  Every 24 hours        08/16/20 1812             Osie Amparo A, PA-C 08/16/20 [place],  Nicki Reaper, MD 08/17/20 [phone]

## 2020-08-16 NOTE — ED Triage Notes (Signed)
She lost her balance and fell backward 4 days ago. C/o back pain since. She has been using a walker since the fall.

## 2020-08-20 ENCOUNTER — Inpatient Hospital Stay (HOSPITAL_BASED_OUTPATIENT_CLINIC_OR_DEPARTMENT_OTHER): Admission: RE | Admit: 2020-08-20 | Payer: Medicare Other | Source: Ambulatory Visit

## 2020-09-20 ENCOUNTER — Other Ambulatory Visit: Payer: Self-pay | Admitting: Neurosurgery

## 2020-09-24 ENCOUNTER — Encounter (HOSPITAL_COMMUNITY): Payer: Self-pay | Admitting: Neurosurgery

## 2020-09-24 ENCOUNTER — Other Ambulatory Visit: Payer: Self-pay

## 2020-09-24 NOTE — Progress Notes (Signed)
Spoke with pt. Instrucked not to take victoza, metformin, ozempic morning of surgery. Check blood sugar as soon as she awakes if less than 70 drink 4 ounces of clear juice. Wait 15 minutes if still below 70  call [phone] and ask for a nurse.      Covid test going to be done today.

## 2020-09-24 NOTE — Progress Notes (Signed)
Called and left message for pt informing her that she will need to have a Covid test in the AM because the test that was done at Covid site will not be resulted in time for surgery. I instructed her to arrive at 6:30 AM instead of 7:00 AM. Left my number if she has any questions.

## 2020-09-25 ENCOUNTER — Other Ambulatory Visit: Payer: Self-pay

## 2020-09-25 ENCOUNTER — Ambulatory Visit (HOSPITAL_COMMUNITY): Payer: Medicare Other | Admitting: Certified Registered Nurse Anesthetist

## 2020-09-25 ENCOUNTER — Ambulatory Visit (HOSPITAL_COMMUNITY): Payer: Medicare Other

## 2020-09-25 ENCOUNTER — Encounter (HOSPITAL_COMMUNITY): Payer: Self-pay | Admitting: Neurosurgery

## 2020-09-25 ENCOUNTER — Encounter (HOSPITAL_COMMUNITY)
Admission: RE | Disposition: A | Discharge status: Home / Self Care | Payer: Self-pay | Source: Home / Self Care | Attending: Neurosurgery

## 2020-09-25 ENCOUNTER — Observation Stay (HOSPITAL_COMMUNITY)
Admission: RE | Admit: 2020-09-25 | Discharge: 2020-09-26 | Disposition: A | Discharge status: Home / Self Care | Payer: Medicare Other | Attending: Neurosurgery | Admitting: Neurosurgery

## 2020-09-25 DIAGNOSIS — M7138 Other bursal cyst, other site: Principal | ICD-10-CM | POA: Insufficient documentation

## 2020-09-25 DIAGNOSIS — Z7984 Long term (current) use of oral hypoglycemic drugs: Secondary | ICD-10-CM | POA: Insufficient documentation

## 2020-09-25 DIAGNOSIS — Z7982 Long term (current) use of aspirin: Secondary | ICD-10-CM | POA: Diagnosis not present

## 2020-09-25 DIAGNOSIS — I2510 Atherosclerotic heart disease of native coronary artery without angina pectoris: Secondary | ICD-10-CM

## 2020-09-25 DIAGNOSIS — I10 Essential (primary) hypertension: Secondary | ICD-10-CM | POA: Insufficient documentation

## 2020-09-25 DIAGNOSIS — M48061 Spinal stenosis, lumbar region without neurogenic claudication: Secondary | ICD-10-CM | POA: Diagnosis not present

## 2020-09-25 DIAGNOSIS — Z79899 Other long term (current) drug therapy: Secondary | ICD-10-CM | POA: Diagnosis not present

## 2020-09-25 DIAGNOSIS — Z87891 Personal history of nicotine dependence: Secondary | ICD-10-CM | POA: Insufficient documentation

## 2020-09-25 DIAGNOSIS — E119 Type 2 diabetes mellitus without complications: Secondary | ICD-10-CM | POA: Insufficient documentation

## 2020-09-25 DIAGNOSIS — Z20822 Contact with and (suspected) exposure to covid-19: Secondary | ICD-10-CM | POA: Diagnosis not present

## 2020-09-25 DIAGNOSIS — Z419 Encounter for procedure for purposes other than remedying health state, unspecified: Secondary | ICD-10-CM

## 2020-09-25 DIAGNOSIS — M5416 Radiculopathy, lumbar region: Secondary | ICD-10-CM | POA: Insufficient documentation

## 2020-09-25 HISTORY — PX: LUMBAR LAMINECTOMY/DECOMPRESSION MICRODISCECTOMY: SHX5026

## 2020-09-25 LAB — CBC WITH DIFFERENTIAL/PLATELET
Abs Immature Granulocytes: 0.06 10*3/uL (ref 0.00–0.07)
Basophils Absolute: 0.1 10*3/uL (ref 0.0–0.1)
Basophils Relative: 1 %
Eosinophils Absolute: 0.5 10*3/uL (ref 0.0–0.5)
Eosinophils Relative: 4 %
HCT: 42.9 % (ref 36.0–46.0)
Hemoglobin: 14.1 g/dL (ref 12.0–15.0)
Immature Granulocytes: 1 %
Lymphocytes Relative: 13 %
Lymphs Abs: 1.5 10*3/uL (ref 0.7–4.0)
MCH: 30.5 pg (ref 26.0–34.0)
MCHC: 32.9 g/dL (ref 30.0–36.0)
MCV: 92.9 fL (ref 80.0–100.0)
Monocytes Absolute: 0.9 10*3/uL (ref 0.1–1.0)
Monocytes Relative: 8 %
Neutro Abs: 8.6 10*3/uL — ABNORMAL HIGH (ref 1.7–7.7)
Neutrophils Relative %: 73 %
Platelets: 291 10*3/uL (ref 150–400)
RBC: 4.62 MIL/uL (ref 3.87–5.11)
RDW: 13.3 % (ref 11.5–15.5)
WBC: 11.6 10*3/uL — ABNORMAL HIGH (ref 4.0–10.5)
nRBC: 0 % (ref 0.0–0.2)

## 2020-09-25 LAB — HEMOGLOBIN A1C
Hgb A1c MFr Bld: 6.4 % — ABNORMAL HIGH (ref 4.8–5.6)
Mean Plasma Glucose: 136.98 mg/dL

## 2020-09-25 LAB — BASIC METABOLIC PANEL
Anion gap: 13 (ref 5–15)
BUN: 17 mg/dL (ref 8–23)
CO2: 22 mmol/L (ref 22–32)
Calcium: 9.1 mg/dL (ref 8.9–10.3)
Chloride: 101 mmol/L (ref 98–111)
Creatinine, Ser: 0.8 mg/dL (ref 0.44–1.00)
GFR, Estimated: >60 mL/min (ref >60)
Glucose, Bld: 155 mg/dL — ABNORMAL HIGH (ref 70–99)
Potassium: 3.5 mmol/L (ref 3.5–5.1)
Sodium: 136 mmol/L (ref 135–145)

## 2020-09-25 LAB — GLUCOSE, CAPILLARY
Glucose-Capillary: 144 mg/dL — ABNORMAL HIGH (ref 70–99)
Glucose-Capillary: 143 mg/dL — ABNORMAL HIGH (ref 70–99)
Glucose-Capillary: 129 mg/dL — ABNORMAL HIGH (ref 70–99)
Glucose-Capillary: 104 mg/dL — ABNORMAL HIGH (ref 70–99)

## 2020-09-25 LAB — SARS CORONAVIRUS 2 BY RT PCR (HOSPITAL ORDER, PERFORMED IN ~~LOC~~ HOSPITAL LAB): SARS Coronavirus 2: NEGATIVE

## 2020-09-25 SURGERY — LUMBAR LAMINECTOMY/DECOMPRESSION MICRODISCECTOMY 1 LEVEL
Anesthesia: General | Site: Back | Laterality: Left

## 2020-09-25 MED ORDER — ZOLPIDEM TARTRATE 5 MG PO TABS
10.0000 mg | ORAL_TABLET | Freq: Every day | ORAL | Status: DC
  Administered 2020-09-25: 10 mg via ORAL
  Filled 2020-09-25: qty 2

## 2020-09-25 MED ORDER — THROMBIN 5000 UNITS EX SOLR
CUTANEOUS | Status: DC | PRN
  Administered 2020-09-25 (×2): 5000 [IU] via TOPICAL

## 2020-09-25 MED ORDER — METOPROLOL SUCCINATE ER 50 MG PO TB24
50.0000 mg | ORAL_TABLET | Freq: Every day | ORAL | Status: DC
  Administered 2020-09-26: 50 mg via ORAL
  Filled 2020-09-25: qty 1

## 2020-09-25 MED ORDER — OMEGA-3-ACID ETHYL ESTERS 1 G PO CAPS
2.0000 g | ORAL_CAPSULE | Freq: Two times a day (BID) | ORAL | Status: DC
  Administered 2020-09-26: 2 g via ORAL
  Filled 2020-09-25 (×2): qty 2

## 2020-09-25 MED ORDER — OXYCODONE HCL 5 MG PO TABS: 5.0000 mg | ORAL_TABLET | Freq: Once | ORAL | Status: DC | PRN

## 2020-09-25 MED ORDER — CYCLOBENZAPRINE HCL 10 MG PO TABS
10.0000 mg | ORAL_TABLET | Freq: Three times a day (TID) | ORAL | Status: DC | PRN
  Administered 2020-09-25: 10 mg via ORAL
  Filled 2020-09-25: qty 1

## 2020-09-25 MED ORDER — CEFAZOLIN SODIUM-DEXTROSE 2-4 GM/100ML-% IV SOLN
2.0000 g | INTRAVENOUS | Status: AC
  Administered 2020-09-25: 2 g via INTRAVENOUS
  Filled 2020-09-25: qty 100

## 2020-09-25 MED ORDER — TRAZODONE HCL 50 MG PO TABS
100.0000 mg | ORAL_TABLET | Freq: Every day | ORAL | Status: DC
  Administered 2020-09-25: 100 mg via ORAL
  Filled 2020-09-25: qty 2

## 2020-09-25 MED ORDER — CHLORHEXIDINE GLUCONATE CLOTH 2 % EX PADS: 6.0000 | MEDICATED_PAD | Freq: Once | CUTANEOUS | Status: DC

## 2020-09-25 MED ORDER — TRIMETHOPRIM 100 MG PO TABS
100.0000 mg | ORAL_TABLET | Freq: Every day | ORAL | Status: DC
  Administered 2020-09-25: 100 mg via ORAL
  Filled 2020-09-25 (×2): qty 1

## 2020-09-25 MED ORDER — KETOROLAC TROMETHAMINE 30 MG/ML IJ SOLN
INTRAMUSCULAR | Status: DC | PRN
  Administered 2020-09-25: 15 mg via INTRAVENOUS

## 2020-09-25 MED ORDER — DIPHENHYDRAMINE HCL 25 MG PO CAPS
50.0000 mg | ORAL_CAPSULE | Freq: Every day | ORAL | Status: DC
  Filled 2020-09-25: qty 2

## 2020-09-25 MED ORDER — 0.9 % SODIUM CHLORIDE (POUR BTL) OPTIME
TOPICAL | Status: DC | PRN
  Administered 2020-09-25: 1000 mL

## 2020-09-25 MED ORDER — OXYCODONE HCL 5 MG/5ML PO SOLN: 5.0000 mg | Freq: Once | ORAL | Status: DC | PRN

## 2020-09-25 MED ORDER — HEMOSTATIC AGENTS (NO CHARGE) OPTIME
TOPICAL | Status: DC | PRN
  Administered 2020-09-25: 1 via TOPICAL

## 2020-09-25 MED ORDER — FENTANYL CITRATE (PF) 250 MCG/5ML IJ SOLN
INTRAMUSCULAR | Status: AC
  Filled 2020-09-25: qty 5

## 2020-09-25 MED ORDER — PHENOL 1.4 % MT LIQD: 1.0000 | OROMUCOSAL | Status: DC | PRN

## 2020-09-25 MED ORDER — BALSALAZIDE DISODIUM 750 MG PO CAPS
2250.0000 mg | ORAL_CAPSULE | Freq: Two times a day (BID) | ORAL | Status: DC
  Administered 2020-09-25 – 2020-09-26 (×2): 2250 mg via ORAL
  Filled 2020-09-25 (×3): qty 3

## 2020-09-25 MED ORDER — AMISULPRIDE (ANTIEMETIC) 5 MG/2ML IV SOLN: 10.0000 mg | Freq: Once | INTRAVENOUS | Status: DC | PRN

## 2020-09-25 MED ORDER — INSULIN ASPART 100 UNIT/ML IJ SOLN
0.0000 [IU] | Freq: Three times a day (TID) | INTRAMUSCULAR | Status: DC
  Administered 2020-09-25: 2 [IU] via SUBCUTANEOUS

## 2020-09-25 MED ORDER — BUPIVACAINE HCL (PF) 0.25 % IJ SOLN
INTRAMUSCULAR | Status: DC | PRN
  Administered 2020-09-25: 20 mL

## 2020-09-25 MED ORDER — HYDROCODONE-ACETAMINOPHEN 5-325 MG PO TABS: 1.0000 | ORAL_TABLET | ORAL | Status: DC | PRN

## 2020-09-25 MED ORDER — VENLAFAXINE HCL ER 75 MG PO CP24
150.0000 mg | ORAL_CAPSULE | Freq: Every day | ORAL | Status: DC
  Administered 2020-09-26: 150 mg via ORAL
  Filled 2020-09-25: qty 2

## 2020-09-25 MED ORDER — SODIUM CHLORIDE 0.9% FLUSH: 3.0000 mL | INTRAVENOUS | Status: DC | PRN

## 2020-09-25 MED ORDER — AMLODIPINE BESYLATE 5 MG PO TABS
10.0000 mg | ORAL_TABLET | Freq: Every day | ORAL | Status: DC
  Administered 2020-09-26: 10 mg via ORAL
  Filled 2020-09-25: qty 2

## 2020-09-25 MED ORDER — METOPROLOL TARTRATE 25 MG PO TABS
25.0000 mg | ORAL_TABLET | Freq: Every day | ORAL | Status: DC
  Administered 2020-09-25 – 2020-09-26 (×2): 25 mg via ORAL
  Filled 2020-09-25 (×2): qty 1

## 2020-09-25 MED ORDER — PROPOFOL 10 MG/ML IV BOLUS
INTRAVENOUS | Status: DC | PRN
  Administered 2020-09-25: 120 mg via INTRAVENOUS
  Administered 2020-09-25: 30 mg via INTRAVENOUS

## 2020-09-25 MED ORDER — MIDAZOLAM HCL 2 MG/2ML IJ SOLN
INTRAMUSCULAR | Status: DC | PRN
  Administered 2020-09-25: 2 mg via INTRAVENOUS

## 2020-09-25 MED ORDER — ATORVASTATIN CALCIUM 40 MG PO TABS
40.0000 mg | ORAL_TABLET | Freq: Every day | ORAL | Status: DC
  Administered 2020-09-25: 40 mg via ORAL
  Filled 2020-09-25: qty 1

## 2020-09-25 MED ORDER — ONE-DAILY MULTI VITAMINS PO TABS: 1.0000 | ORAL_TABLET | Freq: Every day | ORAL | Status: DC

## 2020-09-25 MED ORDER — CHLORHEXIDINE GLUCONATE 0.12 % MT SOLN
15.0000 mL | Freq: Once | OROMUCOSAL | Status: AC
  Administered 2020-09-25: 15 mL via OROMUCOSAL
  Filled 2020-09-25: qty 15

## 2020-09-25 MED ORDER — LIDOCAINE 2% (20 MG/ML) 5 ML SYRINGE
INTRAMUSCULAR | Status: DC | PRN
  Administered 2020-09-25: 80 mg via INTRAVENOUS

## 2020-09-25 MED ORDER — ACETAMINOPHEN 650 MG RE SUPP: 650.0000 mg | RECTAL | Status: DC | PRN

## 2020-09-25 MED ORDER — DIPHENHYDRAMINE HCL 25 MG PO TABS
50.0000 mg | ORAL_TABLET | Freq: Every day | ORAL | Status: DC
  Filled 2020-09-25: qty 2

## 2020-09-25 MED ORDER — CEFAZOLIN SODIUM-DEXTROSE 1-4 GM/50ML-% IV SOLN
1.0000 g | Freq: Three times a day (TID) | INTRAVENOUS | Status: AC
  Administered 2020-09-25 – 2020-09-26 (×2): 1 g via INTRAVENOUS
  Filled 2020-09-25 (×2): qty 50

## 2020-09-25 MED ORDER — ROCURONIUM BROMIDE 10 MG/ML (PF) SYRINGE
PREFILLED_SYRINGE | INTRAVENOUS | Status: DC | PRN
  Administered 2020-09-25: 70 mg via INTRAVENOUS

## 2020-09-25 MED ORDER — ONDANSETRON HCL 4 MG/2ML IJ SOLN
INTRAMUSCULAR | Status: DC | PRN
  Administered 2020-09-25: 4 mg via INTRAVENOUS

## 2020-09-25 MED ORDER — HYDROCODONE-ACETAMINOPHEN 10-325 MG PO TABS
2.0000 | ORAL_TABLET | ORAL | Status: DC | PRN
  Administered 2020-09-25 – 2020-09-26 (×4): 2 via ORAL
  Filled 2020-09-25 (×4): qty 2

## 2020-09-25 MED ORDER — ONDANSETRON 4 MG PO TBDP: 4.0000 mg | ORAL_TABLET | Freq: Three times a day (TID) | ORAL | Status: DC | PRN

## 2020-09-25 MED ORDER — PHENYLEPHRINE HCL-NACL 20-0.9 MG/250ML-% IV SOLN
INTRAVENOUS | Status: DC | PRN
  Administered 2020-09-25: 20 ug/min via INTRAVENOUS

## 2020-09-25 MED ORDER — FENTANYL CITRATE (PF) 250 MCG/5ML IJ SOLN
INTRAMUSCULAR | Status: DC | PRN
  Administered 2020-09-25 (×2): 150 ug via INTRAVENOUS
  Administered 2020-09-25: 50 ug via INTRAVENOUS
  Administered 2020-09-25: 100 ug via INTRAVENOUS
  Administered 2020-09-25: 50 ug via INTRAVENOUS

## 2020-09-25 MED ORDER — HYDROMORPHONE HCL 1 MG/ML IJ SOLN: 1.0000 mg | INTRAMUSCULAR | Status: DC | PRN

## 2020-09-25 MED ORDER — LACTATED RINGERS IV SOLN: INTRAVENOUS | Status: DC

## 2020-09-25 MED ORDER — ADULT MULTIVITAMIN W/MINERALS CH
1.0000 | ORAL_TABLET | Freq: Every day | ORAL | Status: DC
  Administered 2020-09-26: 1 via ORAL
  Filled 2020-09-25: qty 1

## 2020-09-25 MED ORDER — ASPIRIN EC 81 MG PO TBEC
81.0000 mg | DELAYED_RELEASE_TABLET | Freq: Every day | ORAL | Status: DC
  Administered 2020-09-25: 81 mg via ORAL
  Filled 2020-09-25: qty 1

## 2020-09-25 MED ORDER — SUGAMMADEX SODIUM 200 MG/2ML IV SOLN
INTRAVENOUS | Status: DC | PRN
  Administered 2020-09-25: 400 mg via INTRAVENOUS

## 2020-09-25 MED ORDER — ONDANSETRON HCL 4 MG/2ML IJ SOLN: 4.0000 mg | Freq: Once | INTRAMUSCULAR | Status: DC | PRN

## 2020-09-25 MED ORDER — METFORMIN HCL 500 MG PO TABS
500.0000 mg | ORAL_TABLET | Freq: Two times a day (BID) | ORAL | Status: DC
  Administered 2020-09-25 – 2020-09-26 (×2): 500 mg via ORAL
  Filled 2020-09-25 (×2): qty 1

## 2020-09-25 MED ORDER — FENTANYL CITRATE (PF) 100 MCG/2ML IJ SOLN
INTRAMUSCULAR | Status: AC
  Filled 2020-09-25: qty 2

## 2020-09-25 MED ORDER — ACETAMINOPHEN 500 MG PO TABS
1000.0000 mg | ORAL_TABLET | Freq: Once | ORAL | Status: AC
  Administered 2020-09-25: 1000 mg via ORAL
  Filled 2020-09-25: qty 2

## 2020-09-25 MED ORDER — PROPOFOL 10 MG/ML IV BOLUS
INTRAVENOUS | Status: AC
  Filled 2020-09-25: qty 20

## 2020-09-25 MED ORDER — ONDANSETRON HCL 4 MG PO TABS
4.0000 mg | ORAL_TABLET | Freq: Four times a day (QID) | ORAL | Status: DC | PRN
Start: 2020-09-25 — End: 2020-09-26

## 2020-09-25 MED ORDER — MIDAZOLAM HCL 2 MG/2ML IJ SOLN
INTRAMUSCULAR | Status: AC
  Filled 2020-09-25: qty 2

## 2020-09-25 MED ORDER — PANTOPRAZOLE SODIUM 40 MG PO TBEC
40.0000 mg | DELAYED_RELEASE_TABLET | Freq: Every day | ORAL | Status: DC
  Administered 2020-09-25 – 2020-09-26 (×2): 40 mg via ORAL
  Filled 2020-09-25 (×2): qty 1

## 2020-09-25 MED ORDER — FENTANYL CITRATE (PF) 100 MCG/2ML IJ SOLN
25.0000 ug | INTRAMUSCULAR | Status: DC | PRN
  Administered 2020-09-25 (×2): 50 ug via INTRAVENOUS

## 2020-09-25 MED ORDER — DEXMEDETOMIDINE HCL IN NACL 200 MCG/50ML IV SOLN
INTRAVENOUS | Status: DC | PRN
  Administered 2020-09-25: 20 ug via INTRAVENOUS

## 2020-09-25 MED ORDER — ORAL CARE MOUTH RINSE: 15.0000 mL | Freq: Once | OROMUCOSAL | Status: AC

## 2020-09-25 MED ORDER — THROMBIN 5000 UNITS EX SOLR
CUTANEOUS | Status: AC
  Filled 2020-09-25: qty 10000

## 2020-09-25 MED ORDER — ACETAMINOPHEN 325 MG PO TABS: 650.0000 mg | ORAL_TABLET | ORAL | Status: DC | PRN

## 2020-09-25 MED ORDER — KETOROLAC TROMETHAMINE 15 MG/ML IJ SOLN
7.5000 mg | Freq: Four times a day (QID) | INTRAMUSCULAR | Status: DC
  Administered 2020-09-25 – 2020-09-26 (×3): 7.5 mg via INTRAVENOUS
  Filled 2020-09-25 (×3): qty 1

## 2020-09-25 MED ORDER — ONDANSETRON HCL 4 MG/2ML IJ SOLN
4.0000 mg | Freq: Four times a day (QID) | INTRAMUSCULAR | Status: DC | PRN
Start: 2020-09-25 — End: 2020-09-26

## 2020-09-25 MED ORDER — SODIUM CHLORIDE 0.9% FLUSH
3.0000 mL | Freq: Two times a day (BID) | INTRAVENOUS | Status: DC
  Administered 2020-09-25: 3 mL via INTRAVENOUS

## 2020-09-25 MED ORDER — BUDESONIDE 3 MG PO CPEP
6.0000 mg | ORAL_CAPSULE | Freq: Every day | ORAL | Status: DC
  Administered 2020-09-26: 6 mg via ORAL
  Filled 2020-09-25: qty 2

## 2020-09-25 MED ORDER — SODIUM CHLORIDE 0.9 % IV SOLN: 250.0000 mL | INTRAVENOUS | Status: DC

## 2020-09-25 MED ORDER — BUPIVACAINE HCL (PF) 0.25 % IJ SOLN
INTRAMUSCULAR | Status: AC
  Filled 2020-09-25: qty 30

## 2020-09-25 MED ORDER — MENTHOL 3 MG MT LOZG: 1.0000 | LOZENGE | OROMUCOSAL | Status: DC | PRN

## 2020-09-25 SURGICAL SUPPLY — 50 items
BAG COUNTER SPONGE SURGICOUNT (BAG) ×2 IMPLANT
BAG DECANTER FOR FLEXI CONT (MISCELLANEOUS) ×2 IMPLANT
BAND RUBBER #18 3X1/16 STRL (MISCELLANEOUS) ×4 IMPLANT
BENZOIN TINCTURE PRP APPL 2/3 (GAUZE/BANDAGES/DRESSINGS) ×2 IMPLANT
BLADE CLIPPER SURG (BLADE) IMPLANT
BUR CUTTER 7.0 ROUND (BURR) ×2 IMPLANT
CANISTER SUCT 3000ML PPV (MISCELLANEOUS) ×2 IMPLANT
CARTRIDGE OIL MAESTRO DRILL (MISCELLANEOUS) ×1 IMPLANT
CLSR STERI-STRIP ANTIMIC 1/2X4 (GAUZE/BANDAGES/DRESSINGS) ×2 IMPLANT
DECANTER SPIKE VIAL GLASS SM (MISCELLANEOUS) ×2 IMPLANT
DERMABOND ADVANCED (GAUZE/BANDAGES/DRESSINGS) ×1
DERMABOND ADVANCED .7 DNX12 (GAUZE/BANDAGES/DRESSINGS) ×1 IMPLANT
DIFFUSER DRILL AIR PNEUMATIC (MISCELLANEOUS) ×2 IMPLANT
DRAPE HALF SHEET 40X57 (DRAPES) IMPLANT
DRAPE LAPAROTOMY 100X72X124 (DRAPES) ×2 IMPLANT
DRAPE MICROSCOPE LEICA (MISCELLANEOUS) ×2 IMPLANT
DRAPE SURG 17X23 STRL (DRAPES) ×4 IMPLANT
DRSG OPSITE POSTOP 4X6 (GAUZE/BANDAGES/DRESSINGS) ×2 IMPLANT
DURAPREP 26ML APPLICATOR (WOUND CARE) ×2 IMPLANT
ELECT REM PT RETURN 9FT ADLT (ELECTROSURGICAL) ×2
ELECTRODE REM PT RTRN 9FT ADLT (ELECTROSURGICAL) ×1 IMPLANT
GAUZE 4X4 16PLY ~~LOC~~+RFID DBL (SPONGE) ×2 IMPLANT
GAUZE SPONGE 4X4 12PLY STRL (GAUZE/BANDAGES/DRESSINGS) IMPLANT
GLOVE EXAM NITRILE XL STR (GLOVE) IMPLANT
GLOVE SURG ENC MOIS LTX SZ6.5 (GLOVE) ×2 IMPLANT
GLOVE SURG LTX SZ9 (GLOVE) ×2 IMPLANT
GLOVE SURG UNDER POLY LF SZ6.5 (GLOVE) ×2 IMPLANT
GLOVE SURG UNDER POLY LF SZ7 (GLOVE) ×4 IMPLANT
GLOVE SURG UNDER POLY LF SZ7.5 (GLOVE) ×4 IMPLANT
GOWN STRL REUS W/ TWL LRG LVL3 (GOWN DISPOSABLE) ×1 IMPLANT
GOWN STRL REUS W/ TWL XL LVL3 (GOWN DISPOSABLE) ×2 IMPLANT
GOWN STRL REUS W/TWL 2XL LVL3 (GOWN DISPOSABLE) IMPLANT
GOWN STRL REUS W/TWL LRG LVL3 (GOWN DISPOSABLE) ×2
GOWN STRL REUS W/TWL XL LVL3 (GOWN DISPOSABLE) ×4
KIT BASIN OR (CUSTOM PROCEDURE TRAY) ×2 IMPLANT
KIT TURNOVER KIT B (KITS) ×2 IMPLANT
NEEDLE HYPO 22GX1.5 SAFETY (NEEDLE) ×2 IMPLANT
NEEDLE SPNL 22GX3.5 QUINCKE BK (NEEDLE) IMPLANT
NS IRRIG 1000ML POUR BTL (IV SOLUTION) ×2 IMPLANT
OIL CARTRIDGE MAESTRO DRILL (MISCELLANEOUS) ×2
PACK LAMINECTOMY NEURO (CUSTOM PROCEDURE TRAY) ×2 IMPLANT
PAD ARMBOARD 7.5X6 YLW CONV (MISCELLANEOUS) ×6 IMPLANT
SPONGE SURGIFOAM ABS GEL SZ50 (HEMOSTASIS) ×2 IMPLANT
SPONGE T-LAP 4X18 ~~LOC~~+RFID (SPONGE) ×2 IMPLANT
STRIP CLOSURE SKIN 1/2X4 (GAUZE/BANDAGES/DRESSINGS) ×2 IMPLANT
SUT VIC AB 2-0 CT1 18 (SUTURE) ×2 IMPLANT
SUT VIC AB 3-0 SH 8-18 (SUTURE) ×2 IMPLANT
TOWEL GREEN STERILE (TOWEL DISPOSABLE) ×2 IMPLANT
TOWEL GREEN STERILE FF (TOWEL DISPOSABLE) ×2 IMPLANT
WATER STERILE IRR 1000ML POUR (IV SOLUTION) ×2 IMPLANT

## 2020-09-25 NOTE — Brief Op Note (Signed)
09/25/2020  11:42 AM  PATIENT:  Kimberly Lin  75 y.o. female  PRE-OPERATIVE DIAGNOSIS:  synovial cyst  POST-OPERATIVE DIAGNOSIS:  synovial cyst  PROCEDURE:  Procedure(s): Laminectomy for facet/synovial cyst - left - Lumbar four-Lumbar five (Left)  SURGEON:  Surgeon(s) and Role:    Earnie Larsson, MD - Primary  PHYSICIAN ASSISTANT:   ASSISTANTSMearl Latin   ANESTHESIA:   general  EBL:  100 mL   BLOOD ADMINISTERED:none  DRAINS: none   LOCAL MEDICATIONS USED:  MARCAINE     SPECIMEN:  No Specimen  DISPOSITION OF SPECIMEN:  N/A  COUNTS:  YES  TOURNIQUET:  * No tourniquets in log *  DICTATION: .Dragon Dictation  PLAN OF CARE: Admit for overnight observation  PATIENT DISPOSITION:  PACU - hemodynamically stable.   Delay start of Pharmacological VTE agent (>24hrs) due to surgical blood loss or risk of bleeding: yes

## 2020-09-25 NOTE — Progress Notes (Signed)
Orthopedic Tech Progress Note Patient Details:  Kimberly Lin 1945-08-11 [id number]  Ortho Devices Type of Ortho Device: Lumbar corsett Ortho Device/Splint Location: BACK Ortho Device/Splint Interventions: Ordered, Adjustment   Post Interventions Patient Tolerated: Well Instructions Provided: [facility] [place] 09/25/2020, 2:18 PM

## 2020-09-25 NOTE — Anesthesia Preprocedure Evaluation (Addendum)
Anesthesia Evaluation  Patient identified by MRN, date of birth, ID band Patient awake    Reviewed: Allergy & Precautions, NPO status , Patient's Chart, lab work & pertinent test results  Airway Mallampati: III  TM Distance: >3 FB Neck ROM: Full    Dental no notable dental hx.    Pulmonary sleep apnea , former smoker,    Pulmonary exam normal breath sounds clear to auscultation       Cardiovascular METS: 3 - Mets hypertension, Normal cardiovascular exam Rhythm:Regular Rate:Normal  Normal sinus rhythm Low voltage QRS Cannot rule out Anterior infarct , age undetermined Abnormal ECG Confirmed by Ida Rogue [phone]) on 12/22/2019 1:25:26 PM   Neuro/Psych  Headaches, PSYCHIATRIC DISORDERS Anxiety Depression  Neuromuscular disease    GI/Hepatic GERD  ,  Endo/Other  diabetes, Type 2  Renal/GU   negative genitourinary   Musculoskeletal  (+) Arthritis , Osteoarthritis,  Fibromyalgia - (2 percocets a day), narcotic dependent  Abdominal   Peds negative pediatric ROS (+)  Hematology negative hematology ROS (+)   Anesthesia Other Findings   Reproductive/Obstetrics                            Anesthesia Physical Anesthesia Plan  ASA: 3  Anesthesia Plan: General   Post-op Pain Management:    Induction: Intravenous  PONV Risk Score and Plan: 3  Airway Management Planned: Oral ETT  Additional Equipment:   Intra-op Plan:   Post-operative Plan: Extubation in OR  Informed Consent: I have reviewed the patients History and Physical, chart, labs and discussed the procedure including the risks, benefits and alternatives for the proposed anesthesia with the patient or authorized representative who has indicated his/her understanding and acceptance.     Dental advisory given  Plan Discussed with: CRNA, Anesthesiologist and Surgeon  Anesthesia Plan Comments:        Anesthesia Quick  Evaluation

## 2020-09-25 NOTE — Anesthesia Procedure Notes (Signed)

## 2020-09-25 NOTE — Transfer of Care (Signed)
Immediate Anesthesia Transfer of Care Note  Patient: Kimberly Lin  Procedure(s) Performed: Laminectomy for facet/synovial cyst - left - Lumbar four-Lumbar five (Left: Back)  Patient Location: PACU  Anesthesia Type:General  Level of Consciousness: awake, alert  and oriented  Airway & Oxygen Therapy: Patient Spontanous Breathing  Post-op Assessment: Report given to RN and Post -op Vital signs reviewed and stable  Post vital signs: Reviewed and stable  Last Vitals:  Vitals Value Taken Time  BP 138/85 09/25/20 1155  Temp 36.9 C 09/25/20 1155  Pulse 91 09/25/20 1200  Resp 17 09/25/20 1156  SpO2 90 % 09/25/20 1200  Vitals shown include unvalidated device data. Glu 143 Last Pain:  Vitals:   09/25/20 1155  TempSrc:   PainSc: 0-No pain      Patients Stated Pain Goal: 3 ([id number])  Complications: No notable events documented.

## 2020-09-25 NOTE — H&P (Signed)
Kimberly Lin is an 75 y.o. female.   Chief Complaint: Back pain HPI: 75 year old female with severe back and left buttock and left lower extremity pain.  We will work-up demonstrates evidence of a subacute L5 compression fracture with mild loss of height.  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  Patient presents now for decompressive surgery.  Past Medical History:  Diagnosis Date   Abdominal mass    Abnormal nuclear stress test 07/05/2014   Acute encephalopathy 03/26/2016   Arthritis    osteoarthritis   Colitis 2014   Diabetes mellitus    Fibromyalgia    GERD (gastroesophageal reflux disease)    Hiatal hernia    History of chicken pox    History of septic shock 12/2009   Hyperlipidemia    Hypertension    Personal history of colonic polyps    Sepsis secondary to UTI ([place]) 03/26/2016   Spinal stenosis     Past Surgical History:  Procedure Laterality Date   APPENDECTOMY  1983   BREAST BIOPSY Right 2007, 2009   CARDIAC CATHETERIZATION N/A 07/05/2014   Procedure: Left Heart Cath and Coronary Angiography;  Surgeon: Sherren Mocha, MD;  Location: [facility];  Service: Cardiovascular;  Laterality: N/A;   COLONOSCOPY  2014   normal    ESOPHAGOGASTRODUODENOSCOPY     in [place] Bilateral 2006   OVARIAN CYST REMOVAL  2008   Pt states she had a ?cyst removed ?ovaries   POLYPECTOMY  1998   XI ROBOT ASSISTED RECTOPEXY N/A 12/30/2019   Procedure: XI ROBOT ASSISTED RECTOPEXY;  Surgeon: Leighton Ruff, MD;  Location: [facility];  Service: General;  Laterality: N/A;    Family History  Problem Relation Age of Onset   Arthritis Mother    Hyperlipidemia Mother    Hypertension Mother    Diabetes Mother    Heart disease Mother    Breast cancer Mother    Lung cancer Brother    Heart disease Brother        x 2   Hypertension Brother     Hyperlipidemia Brother    Diabetes Brother    Sudden death Father    Lung cancer Father    Rectal cancer Father    Diabetes Sister    Hyperlipidemia Sister    Hypertension Sister    Diabetes Maternal Aunt    Heart attack Maternal Aunt    Hyperlipidemia Maternal Aunt    Hypertension Maternal Aunt    Hypertension Maternal Uncle    Hyperlipidemia Maternal Uncle    Heart attack Maternal Uncle    Diabetes Maternal Uncle    Diabetes Paternal Aunt    Heart attack Paternal Aunt    Hyperlipidemia Paternal Aunt    Hypertension Paternal Aunt    Hypertension Paternal Uncle    Hyperlipidemia Paternal Uncle    Heart attack Paternal Uncle    Diabetes Paternal Uncle    Prostate cancer Brother    Stomach cancer Neg Hx    Esophageal cancer Neg Hx    Social History:  reports that she has quit smoking. Her smoking use included cigarettes. She has never used smokeless tobacco. She reports that she does not currently use alcohol. She reports that she does not use drugs.  Allergies:  Allergies  Allergen Reactions   Erythromycin Swelling    "all mycins"   Losartan  hyperkalemia   Prednisone Other (See Comments)    "Insomnia" and "makes me crazy"    Medications Prior to Admission  Medication Sig Dispense Refill   acetaminophen (TYLENOL) 500 MG tablet Take 1,000 mg by mouth every 6 (six) hours as needed for moderate pain.     amLODipine (NORVASC) 10 MG tablet Take 1 tablet (10 mg total) by mouth daily. 90 tablet 0   aspirin 81 MG tablet Take 81 mg by mouth at bedtime.     atorvastatin (LIPITOR) 40 MG tablet Take 1 tablet (40 mg total) by mouth daily. (Patient taking differently: Take 40 mg by mouth at bedtime.) 90 tablet 0   balsalazide (COLAZAL) 750 MG capsule Take 2,250 mg by mouth in the morning and at bedtime.     budesonide (ENTOCORT EC) 3 MG 24 hr capsule Take 6 mg by mouth daily.     diphenhydrAMINE (BENADRYL) 25 MG tablet Take 50 mg by mouth at bedtime.      diphenhydramine-acetaminophen (TYLENOL PM) 25-500 MG TABS tablet Take 1-2 tablets by mouth at bedtime as needed (sleep).     lidocaine (LIDODERM) 5 % Place 1 patch onto the skin daily. Remove & Discard patch within 12 hours or as directed by MD (Patient taking differently: Place 1 patch onto the skin daily as needed (pain). Remove & Discard patch within 12 hours or as directed by MD) 30 patch 0   metFORMIN (GLUCOPHAGE) 500 MG tablet Take 500 mg by mouth 2 (two) times daily.     methocarbamol (ROBAXIN) 500 MG tablet Take 1 tablet (500 mg total) by mouth 2 (two) times daily. 20 tablet 0   metoprolol succinate (TOPROL-XL) 50 MG 24 hr tablet Take 1 tablet (50 mg total) by mouth daily. Take with or immediately following a meal. (Patient taking differently: Take 50 mg by mouth daily.) 90 tablet 0   metoprolol tartrate (LOPRESSOR) 25 MG tablet Take 25 mg by mouth daily.     Multiple Vitamin (MULTIVITAMIN) tablet Take 1 tablet by mouth daily.       omeprazole (PRILOSEC) 40 MG capsule Take 40 mg by mouth at bedtime.     oxyCODONE-acetaminophen (PERCOCET) 10-325 MG tablet Take 1-2 tablets by mouth every 8 (eight) hours as needed for pain.     Semaglutide,0.25 or 0.'5MG'$ /DOS, (OZEMPIC, 0.25 OR 0.5 MG/DOSE,) 2 MG/1.5ML SOPN Inject 0.5 mg into the skin every Wednesday.      traZODone (DESYREL) 50 MG tablet Take 100 mg by mouth at bedtime.     trimethoprim (TRIMPEX) 100 MG tablet Take 100 mg by mouth at bedtime.     venlafaxine XR (EFFEXOR-XR) 75 MG 24 hr capsule TAKE TWO CAPSULES BY MOUTH ONCE DAILY (Patient taking differently: Take 150 mg by mouth daily.) 180 capsule 0   zolpidem (AMBIEN) 5 MG tablet Take 10 mg by mouth at bedtime.     escitalopram (LEXAPRO) 20 MG tablet Take 1 tablet (20 mg total) by mouth daily. (Patient not taking: Reported on 09/24/2020) 90 tablet 1   liraglutide (VICTOZA) 18 MG/3ML SOPN Inject 1.'8mg'$  once daily (Patient not taking: No sig reported) 15 mL 1   Magnesium Oxide 400 (240 Mg) MG TABS  Take 1 tablet by mouth daily. (Patient not taking: No sig reported) 30 tablet    metFORMIN (GLUCOPHAGE) 1000 MG tablet Take 1 tablet (1,000 mg total) by mouth 2 (two) times daily with a meal. (Patient not taking: Reported on 09/24/2020) 180 tablet 1   Omega-3 Fatty Acids (FISH OIL) 1000  MG CAPS 2 caps by mouth twice daily (Patient taking differently: Take 2,000 mg by mouth 2 (two) times daily.)  0   ondansetron (ZOFRAN-ODT) 4 MG disintegrating tablet Take 4 mg by mouth every 8 (eight) hours as needed for nausea or vomiting.     pantoprazole (PROTONIX) 40 MG tablet Take 1 tablet (40 mg total) by mouth at bedtime. (Patient not taking: Reported on 09/24/2020) 90 tablet 1   Probiotic Product (PROBIOTIC COMPLEX ACIDOPHILUS) CAPS Take 1 capsule by mouth 4 (four) times daily - after meals and at bedtime. (Patient not taking: Reported on 09/24/2020) 120 capsule 0    Results for orders placed or performed during the hospital encounter of 09/25/20 (from the past 48 hour(s))  SARS Coronavirus 2 by RT PCR (hospital order, performed in [facility] hospital lab) Nasopharyngeal Nasopharyngeal Swab     Status: None   Collection Time: 09/25/20  6:49 AM   Specimen: Nasopharyngeal Swab  Result Value Ref Range   SARS Coronavirus 2 NEGATIVE NEGATIVE    Comment: (NOTE) SARS-CoV-2 target nucleic acids are NOT DETECTED.  The SARS-CoV-2 RNA is generally detectable in upper and lower respiratory specimens during the acute phase of infection. The lowest concentration of SARS-CoV-2 viral copies this assay can detect is 250 copies / mL. A negative result does not preclude SARS-CoV-2 infection and should not be used as the sole basis for treatment or other patient management decisions.  A negative result may occur with improper specimen collection / handling, submission of specimen other than nasopharyngeal swab, presence of viral mutation(s) within the areas targeted by this assay, and inadequate number of viral  copies (<250 copies / mL). A negative result must be combined with clinical observations, patient history, and epidemiological information.  Fact Sheet for Patients:   StrictlyIdeas.no  Fact Sheet for Healthcare Providers: BankingDealers.co.za  This test is not yet approved or  cleared by the [place] FDA and has been authorized for detection and/or diagnosis of SARS-CoV-2 by FDA under an Emergency Use Authorization (EUA).  This EUA will remain in effect (meaning this test can be used) for the duration of the COVID-19 declaration under Section 564(b)(1) of the Act, 21 U.S.C. section 360bbb-3(b)(1), unless the authorization is terminated or revoked sooner.  Performed at [facility], [place] [address] [place], [place] [address]   Glucose, capillary     Status: Abnormal   Collection Time: 09/25/20  7:18 AM  Result Value Ref Range   Glucose-Capillary 144 (H) 70 - 99 mg/dL    Comment: Glucose reference range applies only to samples taken after fasting for at least 8 hours.  Basic metabolic panel     Status: Abnormal   Collection Time: 09/25/20  7:20 AM  Result Value Ref Range   Sodium 136 135 - 145 mmol/L   Potassium 3.5 3.5 - 5.1 mmol/L   Chloride 101 98 - 111 mmol/L   CO2 22 22 - 32 mmol/L   Glucose, Bld 155 (H) 70 - 99 mg/dL    Comment: Glucose reference range applies only to samples taken after fasting for at least 8 hours.   BUN 17 8 - 23 mg/dL   Creatinine, Ser 0.80 0.44 - 1.00 mg/dL   Calcium 9.1 8.9 - 10.3 mg/dL   GFR, Estimated >60 >60 mL/min    Comment: (NOTE) Calculated using the CKD-EPI Creatinine Equation (2021)    Anion gap 13 5 - 15    Comment: Performed at [facility]  [address] [place], [place] [address]  CBC WITH DIFFERENTIAL     Status: Abnormal   Collection Time: 09/25/20  7:20 AM  Result Value Ref Range   WBC 11.6 (H) 4.0 - 10.5 K/uL   RBC 4.62 3.87 - 5.11 MIL/uL   Hemoglobin 14.1  12.0 - 15.0 g/dL   HCT 42.9 36.0 - 46.0 %   MCV 92.9 80.0 - 100.0 fL   MCH 30.5 26.0 - 34.0 pg   MCHC 32.9 30.0 - 36.0 g/dL   RDW 13.3 11.5 - 15.5 %   Platelets 291 150 - 400 K/uL   nRBC 0.0 0.0 - 0.2 %   Neutrophils Relative % 73 %   Neutro Abs 8.6 (H) 1.7 - 7.7 K/uL   Lymphocytes Relative 13 %   Lymphs Abs 1.5 0.7 - 4.0 K/uL   Monocytes Relative 8 %   Monocytes Absolute 0.9 0.1 - 1.0 K/uL   Eosinophils Relative 4 %   Eosinophils Absolute 0.5 0.0 - 0.5 K/uL   Basophils Relative 1 %   Basophils Absolute 0.1 0.0 - 0.1 K/uL   Immature Granulocytes 1 %   Abs Immature Granulocytes 0.06 0.00 - 0.07 K/uL    Comment: Performed at [facility] [address], [place], [place] [address]   No results found.  Pertinent items noted in HPI and remainder of comprehensive ROS otherwise negative.  Blood pressure (!) 163/75, pulse 91, temperature 97.6 F (36.4 C), temperature source Oral, resp. rate 16, height 5' 3.5" (1.613 m), weight 70.3 kg, SpO2 98 %.  Patient is awake and alert.  She is oriented and appropriate.  She is in a great deal of pain.  Examination head ears eyes nose throat is unremarked.  Chest and abdomen are benign.  Extremities are free of major deformity.  Neurologically she is awake and alert.  Oriented and appropriate.  Speech is fluent.  Judgment insight are intact.  Cranial nerve function normal bilateral.  Motor examination with some mild weakness of dorsiflexion on the left side.  Straight raising strongly positive on the left equivocal on the right.  Decreased sensation pinprick light touch in her left L5 and S1 dermatomes.  No evidence of long track signs. 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  Plan left L4-5 decompressive laminotomy and resection of synovial cyst.  Risks and benefits been explained.  Patient wishes to proceed.  With regard to her compression fracture we will continue to treat this nonoperatively and this should heal with  bracing alone.  Mallie Mussel A Ted Goodner 09/25/2020, 10:09 AM

## 2020-09-25 NOTE — Op Note (Signed)
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  Asst. Surgeon: Reinaldo Meeker, NP  Anesthesia: General  Indication: 75 year old female status post L5 compression fracture with secondary partial collapse.  Patient with severe back and radicular symptoms left greater than right.  Work-up demonstrates evidence of healing of her L5 fracture but severe L4-5 stenosis with a secondary left-sided L4-5 synovial cyst with some associated hemorrhage causing marked compression of thecal sac and left L5 nerve root.  Patient has failed conservative management presents now for decompression and resection of cyst.  Operative note: After induction of anesthesia, patient position prone onto Wilson frame appropriate padded.  Lumbar region prepped and draped sterilely.  Incision made overlying L4-5.  Dissection performed in the left.  Retractor placed.  X-ray taken.  Level confirmed.  Laminotomy then performed using high-speed drill and Kerrison rongeurs to remove the inferior aspect of the lamina of L4 the medial aspect the L4-5 facet joint and the superior rim of the L5 lamina.  Sublaminar decompression was also carried across midline by drilling the lamina toward the right lateral recess.  Microscope brought in field used microdissection spinal canal.  Ligament flavum was elevated and resected.  This was done to the left-sided L4-5 synovial cyst.  This was adherent to the underlying dura.  This was dissected free using microdissection and completely resected.  Sublaminar decompression on the right side was completed.  Foraminotomies were performed on the course exiting left L4 and L5 nerve  roots.  The disc space was examined and found to be flat.  The bone of L5 was palpated and seen to be solid.  Wound is then irrigated.  Gelfoam was placed topically for hemostasis.  Wounds then closed in layers with Vicryl sutures.  Steri-Strips and sterile dressing were applied.  No apparent complications.  Patient tolerated the procedure well and she returns to recovery room postop.

## 2020-09-26 ENCOUNTER — Encounter (HOSPITAL_COMMUNITY): Payer: Self-pay | Admitting: Neurosurgery

## 2020-09-26 DIAGNOSIS — M7138 Other bursal cyst, other site: Secondary | ICD-10-CM | POA: Diagnosis not present

## 2020-09-26 LAB — GLUCOSE, CAPILLARY: Glucose-Capillary: 154 mg/dL — ABNORMAL HIGH (ref 70–99)

## 2020-09-26 MED ORDER — ATORVASTATIN CALCIUM 40 MG PO TABS: 40.0000 mg | ORAL_TABLET | Freq: Every day | ORAL | Status: DC

## 2020-09-26 MED ORDER — METOPROLOL SUCCINATE ER 50 MG PO TB24: 50.0000 mg | ORAL_TABLET | Freq: Every day | ORAL | Status: AC

## 2020-09-26 MED ORDER — LIDOCAINE 5 % EX PTCH: 1.0000 | MEDICATED_PATCH | Freq: Every day | CUTANEOUS | Status: AC | PRN

## 2020-09-26 MED ORDER — TIZANIDINE HCL 4 MG PO TABS: 4.0000 mg | ORAL_TABLET | Freq: Three times a day (TID) | ORAL | 1 refills | Status: AC | PRN

## 2020-09-26 NOTE — Discharge Summary (Signed)
Physician Discharge Summary     Providing Compassionate, Quality Care - Together   Patient ID: Kimberly Lin MRN: [id number] DOB/AGE: 06-02-1945 75 y.o.  Admit date: 09/25/2020 Discharge date: 09/26/2020  Admission Diagnoses: Synovial cys of lumbar facet joint  Discharge Diagnoses:  Active Problems:   Synovial cyst of lumbar facet joint   Discharged Condition: good  Hospital Course: Patient underwent a left L4-5 decompressive laminotomy, with right sublaminar decompressive laminotomy and resection of left L4-5 adherent synovial cyst by Dr. Annette Stable on 09/25/2020. She was admitted to 3C09 following recovery from anesthesia in the PACU. Her postoperative course has been uncomplicated. She has worked with both physical and occupational therapies who feel the patient is ready for discharge home. She is ambulating independently and without difficulty. She is tolerating a normal diet. She is not having any bowel or bladder dysfunction. Her pain is well-controlled with oral pain medication. She is ready for discharge home.   Consults: None  Significant Diagnostic Studies: radiology: DG Lumbar Spine 1 View  Result Date: 09/25/2020 CLINICAL DATA:  Intraoperative localization. EXAM: LUMBAR SPINE - 1 VIEW COMPARISON:  Lumbar radiographs 09/13/2020 FINDINGS: Surgical retractor and surgical instrument marking the L4-5 intervertebral disc space level. IMPRESSION: L4-5 marked intraoperatively. Electronically Signed   By: Marijo Sanes M.D.   On: 09/25/2020 14:18     Treatments: surgery: Left L4-5 decompressive laminotomy with right sublaminar decompressive laminotomy and resection of left L4-5 adherent synovial cyst, microdissection  Discharge Exam: Blood pressure (!) 153/77, pulse 97, temperature 98.1 F (36.7 C), temperature source Oral, resp. rate 16, height 5' 3.5" (1.613 m), weight 70.3 kg, SpO2 94 %.  Alert and oriented x 4 PERRLA CN II-XII grossly intact MAE, Strength and sensation  intact Incision is covered with Honeycomb dressing and Steri Strips; Dressing is clean, dry, and intact   Disposition: Discharge disposition: 01-Home or Self Care        Allergies as of 09/26/2020       Reactions   Erythromycin Swelling   "all mycins"   Losartan    hyperkalemia   Prednisone Other (See Comments)   "Insomnia" and "makes me crazy"        Medication List     STOP taking these medications    diphenhydramine-acetaminophen 25-500 MG Tabs tablet Commonly known as: TYLENOL PM   escitalopram 20 MG tablet Commonly known as: Lexapro   liraglutide 18 MG/3ML Sopn Commonly known as: Victoza   magnesium oxide 400 (240 Mg) MG tablet Commonly known as: MAG-OX   methocarbamol 500 MG tablet Commonly known as: ROBAXIN   pantoprazole 40 MG tablet Commonly known as: Protonix   Probiotic Complex Acidophilus Caps       TAKE these medications    acetaminophen 500 MG tablet Commonly known as: TYLENOL Take 1,000 mg by mouth every 6 (six) hours as needed for moderate pain.   amLODipine 10 MG tablet Commonly known as: NORVASC Take 1 tablet (10 mg total) by mouth daily.   aspirin 81 MG tablet Take 81 mg by mouth at bedtime.   atorvastatin 40 MG tablet Commonly known as: LIPITOR Take 1 tablet (40 mg total) by mouth at bedtime.   balsalazide 750 MG capsule Commonly known as: COLAZAL Take 2,250 mg by mouth in the morning and at bedtime.   budesonide 3 MG 24 hr capsule Commonly known as: ENTOCORT EC Take 6 mg by mouth daily.   diphenhydrAMINE 25 MG tablet Commonly known as: BENADRYL Take 50 mg by mouth at bedtime.  Fish Oil 1000 MG Caps 2 caps by mouth twice daily What changed:  how much to take how to take this when to take this additional instructions   lidocaine 5 % Commonly known as: Lidoderm Place 1 patch onto the skin daily as needed (pain). Remove & Discard patch within 12 hours or as directed by MD   metFORMIN 500 MG tablet Commonly  known as: GLUCOPHAGE Take 500 mg by mouth 2 (two) times daily. What changed: Another medication with the same name was removed. Continue taking this medication, and follow the directions you see here.   metoprolol succinate 50 MG 24 hr tablet Commonly known as: TOPROL-XL Take 1 tablet (50 mg total) by mouth daily.   metoprolol tartrate 25 MG tablet Commonly known as: LOPRESSOR Take 25 mg by mouth daily.   multivitamin tablet Take 1 tablet by mouth daily.   omeprazole 40 MG capsule Commonly known as: PRILOSEC Take 40 mg by mouth at bedtime.   ondansetron 4 MG disintegrating tablet Commonly known as: ZOFRAN-ODT Take 4 mg by mouth every 8 (eight) hours as needed for nausea or vomiting.   oxyCODONE-acetaminophen 10-325 MG tablet Commonly known as: PERCOCET Take 1-2 tablets by mouth every 8 (eight) hours as needed for pain.   Ozempic (0.25 or 0.5 MG/DOSE) 2 MG/1.5ML Sopn Generic drug: Semaglutide(0.25 or 0.'5MG'$ /DOS) Inject 0.5 mg into the skin every Wednesday.   tiZANidine 4 MG tablet Commonly known as: Zanaflex Take 1 tablet (4 mg total) by mouth every 8 (eight) hours as needed for muscle spasms.   traZODone 50 MG tablet Commonly known as: DESYREL Take 100 mg by mouth at bedtime.   trimethoprim 100 MG tablet Commonly known as: TRIMPEX Take 100 mg by mouth at bedtime.   venlafaxine XR 75 MG 24 hr capsule Commonly known as: EFFEXOR-XR TAKE TWO CAPSULES BY MOUTH ONCE DAILY What changed:  how much to take how to take this when to take this additional instructions   zolpidem 5 MG tablet Commonly known as: AMBIEN Take 10 mg by mouth at bedtime.        Follow-up Information     Earnie Larsson, MD. Go on 10/04/2020.   Specialty: Neurosurgery Why: Appointment is at 12:30 pm on 10/04/2020. Contact information: [address] [place] [address] [phone]                 Signed: Viona Gilmore, DNP, AGNP-C Nurse Practitioner  [facility]  [facility] [address], [place], [place],  [address] P: [phone]    F: [phone]  09/26/2020, 9:07 AM

## 2020-09-26 NOTE — Discharge Instructions (Signed)

## 2020-09-26 NOTE — Evaluation (Signed)
Physical Therapy Evaluation Patient Details Name: Kimberly Lin MRN: [id number] DOB: 07-01-1945 Today's Date: 09/26/2020   History of Present Illness  75 y.o. femlae presenting for elective L4-5 laminectomy on 8/16 secondary to severe back, LLE and buttock pain s/p ground level fall resulting in L5 compression fx. Work-up also (+) for synovial cyst. PMHx significant for OA, DMII, fibromyalgia, HLD, and HTN.   Clinical Impression  Pt in bed upon arrival of PT, agreeable to evaluation at this time. Prior to admission the pt was instructed to mobilize in [facility] only, but had previously been ambulatory with intermittent use of her spouse's rollator. The pt now presents with limitations in functional mobility, strength, power, muscular endurance, and dynamic stability due to above dx, and will continue to benefit from skilled PT to address these deficits. The pt was able to complete sit-stand transfers without physical assist, but benefits from RW for BUE support with ambulation. The pt had no overt LOB, but benefits from intermittent cues for posture, positioning in RW, and decreased pressure on RW. The pt is safe to return home with intermittent assist from family, but will benefit from HHPT to facilitate return to independence and decrease reliance on AD. Pt educated on spinal precautions, role of PT to address core strength and stability, once cleared by MD, and how these may relate to back spasms she experienced prior to this surgery. We also discussed alternative pain management such as meditation. No further acute PT needs identified at this time, thank you for the consult.      Follow Up Recommendations Home health PT;Supervision for mobility/OOB    Equipment Recommendations  Other (comment) (4 wheel walker)    Recommendations for Other Services       Precautions / Restrictions Precautions Precautions: Fall;Back Precaution Booklet Issued: Yes (comment) Precaution Comments: Written and verbal  education provided; requires cues for adherence during ADLs. Required Braces or Orthoses: Spinal Brace Spinal Brace: Lumbar corset;Applied in standing position Restrictions Weight Bearing Restrictions: No      Mobility  Bed Mobility Overal bed mobility: Needs Assistance Bed Mobility: Rolling;Sidelying to Sit Rolling: Supervision Sidelying to sit: Supervision       General bed mobility comments: Supervision A with cues for log rolling technique.    Transfers Overall transfer level: Needs assistance Equipment used: None Transfers: Sit to/from Stand Sit to Stand: Supervision         General transfer comment: Supervision A for safety.  Ambulation/Gait Ambulation/Gait assistance: Min guard Gait Distance (Feet): 200 Feet Assistive device: Rolling walker (2 wheeled) Gait Pattern/deviations: Decreased stride length;Trunk flexed Gait velocity: WFL   General Gait Details: pt needing BUE support due to instability at this time, as well as to manage pain. able to make corrections to posture, positioning in RW, and reduce trunk flexion with cues     Balance Overall balance assessment: Needs assistance Sitting-balance support: No upper extremity supported;Feet supported Sitting balance-Leahy Scale: Good     Standing balance support: Single extremity supported Standing balance-Leahy Scale: Fair Standing balance comment: reliant on at least single UE support, improved stability with BUE support for gait                             Pertinent Vitals/Pain Pain Assessment: 0-10 Pain Score: 3  Pain Location: Incision Pain Descriptors / Indicators: Aching;Sore Pain Intervention(s): Limited activity within patient's tolerance;Monitored during session;Repositioned    Home Living Family/patient expects to be discharged to:: Private  residence Living Arrangements: Spouse/significant other Available Help at Discharge: Family;Available 24 hours/day Type of Home:  House Home Access: Level entry     Home Layout: One level Home Equipment: Walker - 4 wheels;Bedside commode;Shower seat;Wheelchair - Actor Comments: 4-wheel walker is her husbands, pt would like her own. husband is "disabled veteran" who the pt is the primary caretaker of    Prior Function Level of Independence: Needs assistance   Gait / Transfers Assistance Needed: pt was told by MD to be reliant on [facility] for last month PTA, prior to fall the pt was mobilizing with use of her husband's rollator  ADL's / Homemaking Assistance Needed: Assist from husband since lumbar compression fracture. Prior to fracture patient was I with ADLs/IADLs including driving.        Hand Dominance   Dominant Hand: Right    Extremity/Trunk Assessment   Upper Extremity Assessment Upper Extremity Assessment: Defer to OT evaluation;Overall [facility] for tasks assessed    Lower Extremity Assessment Lower Extremity Assessment: Overall WFL for tasks assessed (pt reports no difference in sensation bilaterally, grossly 4/5 to MMT)    Cervical / Trunk Assessment Cervical / Trunk Assessment: Other exceptions Cervical / Trunk Exceptions: s/p lumbar surgery.  Communication   Communication: No difficulties  Cognition Arousal/Alertness: Awake/alert Behavior During Therapy: Impulsive;WFL for tasks assessed/performed Overall Cognitive Status: Within Functional Limits for tasks assessed                                 General Comments: mild impulsivity, pt internally distracted, emotional through session due to concern for being a burden on family. able to verbally recall precautions, but with poor ability to apply to mobility      General Comments General comments (skin integrity, edema, etc.): pt educated on spinal precautions, role of pt to address core strength and stability and how these may relate to back spasms she experienced prior to this surgery. also discussed  alternative pain management such as meditation        Assessment/Plan    PT Assessment All further PT needs can be met in the next venue of care  PT Problem List Decreased strength;Decreased activity tolerance;Decreased balance;Pain       PT Treatment Interventions      PT Goals (Current goals can be found in the Care Plan section)  Acute Rehab PT Goals Patient Stated Goal: To return home. PT Goal Formulation: With patient Time For Goal Achievement: 10/03/20 Potential to Achieve Goals: Good     AM-PAC PT "6 Clicks" Mobility  Outcome Measure Help needed turning from your back to your side while in a flat bed without using bedrails?: A Little Help needed moving from lying on your back to sitting on the side of a flat bed without using bedrails?: A Little Help needed moving to and from a bed to a chair (including a wheelchair)?: A Little Help needed standing up from a chair using your arms (e.g., wheelchair or bedside chair)?: A Little Help needed to walk in hospital room?: A Little Help needed climbing 3-5 steps with a railing? : A Lot 6 Click Score: 17    End of Session Equipment Utilized During Treatment: Gait belt;Back brace Activity Tolerance: Patient tolerated treatment well Patient left: in bed;with call bell/phone within reach Nurse Communication: Mobility status PT Visit Diagnosis: Other abnormalities of gait and mobility (R26.89);Pain    Time: 7829-5621 PT Time Calculation (min) (ACUTE  ONLY): 20 min   Charges:   PT Evaluation $PT Eval Low Complexity: 1 Low          Marisha Renier [place] Hun, PT, DPT   Acute Rehabilitation Department Pager #: [phone]  Ronnie Derby 09/26/2020, 11:10 AM

## 2020-09-26 NOTE — Anesthesia Postprocedure Evaluation (Signed)
Anesthesia Post Note  Patient: Kimberly Lin  Procedure(s) Performed: Laminectomy for facet/synovial cyst - left - Lumbar four-Lumbar five (Left: Back)     Anesthesia Post Evaluation No notable events documented.  Last Vitals:  Vitals:   09/25/20 2335 09/26/20 0258  BP: (!) 146/68 (!) 162/88  Pulse: 80 90  Resp:  18  Temp: 36.8 C 36.9 C  SpO2: 92% 97%    Last Pain:  Vitals:   09/26/20 0611  TempSrc:   PainSc: Asleep   Pain Goal: Patients Stated Pain Goal: 3 (09/26/20 0534)                 Merlinda Frederick

## 2020-09-26 NOTE — Plan of Care (Signed)
Pt doing well. Pt given D/C instructions with verbal understanding. Rx's were sent to the pharmacy by MD. Pt's incision is clean and dry with no sign of infection. Pt's IV was removed prior to D/C. Pt D/C'd home via wheelchair per MD order. Pt is stable @ D/C and has no other needs at this time. Pt will follow-up with Dr. Annette Stable at scheduled appointment. Home Health was arranged by [facility] per MD order. Holli Humbles, RN

## 2020-09-26 NOTE — Evaluation (Signed)
Occupational Therapy Evaluation Patient Details Name: Kimberly Lin MRN: [id number] DOB: 01/20/1946 Today's Date: 09/26/2020    History of Present Illness 75 y.o. femlae presenting for elective L4-5 laminectomy on 8/16 secondary to severe back, LLE and buttock pain s/p ground level fall resulting in L5 compression fx. Work-up also (+) for synovial cyst. PMHx significant for OA, DMII, fibromyalgia, HLD, and HTN.   Clinical Impression   PTA patient was living with her spouse in a private residence with level entry and was independent with ADLs/IADLs without AD prior to fall resulting in L5 compression fracture. Since fall, patient has been reliant on assist with IADLs and reports using wheelchair in home and community dwellings. Patient currently presents below baseline level of function demonstrating UB/LB dressing with set-up to Min guard for balance/steadying. Cues needed for adherence to spinal precautions. OT provided education on home set-up to maximize safety and independence with self-care tasks, acquisition/use of AE and activity pacing. Patient expressed verbal understanding. Patient does not require continued acute occupational therapy services with OT to sign off at this time. All other needs can be met at next level of care with recommendation for HHOT and 24hr supervision/assist.     Follow Up Recommendations  Home health OT;Supervision/Assistance - 24 hour    Equipment Recommendations  None recommended by OT (Patient has necessary DME.)    Recommendations for Other Services       Precautions / Restrictions Precautions Precautions: Fall;Back Precaution Booklet Issued: Yes (comment) Precaution Comments: Written and verbal education provided; requires cues for adherence during ADLs. Required Braces or Orthoses: Spinal Brace Spinal Brace: Lumbar corset;Applied in standing position      Mobility Bed Mobility Overal bed mobility: Needs Assistance Bed Mobility:  Rolling;Sidelying to Sit Rolling: Supervision Sidelying to sit: Supervision       General bed mobility comments: Supervision A with cues for log rolling technique.    Transfers Overall transfer level: Needs assistance Equipment used: None Transfers: Sit to/from Stand Sit to Stand: Supervision         General transfer comment: Supervision A for safety.    Balance Overall balance assessment: Needs assistance Sitting-balance support: No upper extremity supported;Feet supported Sitting balance-Leahy Scale: Good     Standing balance support: Single extremity supported Standing balance-Leahy Scale: Fair Standing balance comment: Reliant on at least unilateral UE support                           ADL either performed or assessed with clinical judgement   ADL Overall ADL's : Needs assistance/impaired Eating/Feeding: Independent;Sitting               Upper Body Dressing : Set up;Sitting Upper Body Dressing Details (indicate cue type and reason): Donned UB clothing seated EOB. Lower Body Dressing: Supervision/safety Lower Body Dressing Details (indicate cue type and reason): Figure-4 position to don LB clothing and footwear. Toilet Transfer: Hydrographic surveyor Details (indicate cue type and reason): Simulated with transfer to recliner without AD. Would benefit from use of AD.         Functional mobility during ADLs: Min guard       Vision Baseline Vision/History: Wears glasses Wears Glasses: Reading only Patient Visual Report: No change from baseline Vision Assessment?: No apparent visual deficits     Perception     Praxis      Pertinent Vitals/Pain Pain Assessment: 0-10 Pain Score: 3  Pain Location: Incision Pain Descriptors / Indicators:  Aching;Sore Pain Intervention(s): Limited activity within patient's tolerance;Monitored during session;Premedicated before session;Repositioned     Hand Dominance Right   Extremity/Trunk  Assessment Upper Extremity Assessment Upper Extremity Assessment: Overall WFL for tasks assessed   Lower Extremity Assessment Lower Extremity Assessment: Defer to PT evaluation   Cervical / Trunk Assessment Cervical / Trunk Assessment: Other exceptions Cervical / Trunk Exceptions: s/p lumbar surgery.   Communication Communication Communication: No difficulties   Cognition Arousal/Alertness: Awake/alert Behavior During Therapy: Impulsive Overall Cognitive Status: Within Functional Limits for tasks assessed                                 General Comments: Mild impulsivity noted; poor activity pacing   General Comments  Clean, dry dressing at incision.    Exercises     Shoulder Instructions      Home Living Family/patient expects to be discharged to:: Private residence Living Arrangements: Spouse/significant other Available Help at Discharge: Family;Available 24 hours/day Type of Home: House Home Access: Level entry     Home Layout: One level     Bathroom Shower/Tub: Walk-in shower;Door   [employer] Toilet: Standard     Home Equipment: Environmental consultant - 4 wheels;Bedside commode;Shower seat;Wheelchair - Civil engineer, contracting: [employer]        Prior Functioning/Environment Level of Independence: Needs assistance  Gait / Transfers Assistance Needed: Reliant on wc prior to admission secondary to compression fracture. Prior to fall, patient was ambulating with intermittent use of rollator. ADL's / Homemaking Assistance Needed: Assist from husband since lumbar compression fracture. Prior to fracture patient was I with ADLs/IADLs including driving.            OT Problem List: Impaired balance (sitting and/or standing);Decreased safety awareness;Decreased knowledge of use of DME or AE;Decreased knowledge of precautions      OT Treatment/Interventions:      OT Goals(Current goals can be found in the care plan section) Acute Rehab OT  Goals Patient Stated Goal: To return home. OT Goal Formulation: With patient  OT Frequency:     Barriers to D/C:            Co-evaluation              AM-PAC OT "6 Clicks" Daily Activity     Outcome Measure Help from another person eating meals?: None Help from another person taking care of personal grooming?: A Little Help from another person toileting, which includes using toliet, bedpan, or urinal?: A Little Help from another person bathing (including washing, rinsing, drying)?: A Little Help from another person to put on and taking off regular upper body clothing?: A Little Help from another person to put on and taking off regular lower body clothing?: A Little 6 Click Score: 19   End of Session Equipment Utilized During Treatment: Back brace Nurse Communication: Mobility status  Activity Tolerance: Patient tolerated treatment well Patient left: in chair;with call bell/phone within reach  OT Visit Diagnosis: Unsteadiness on feet (R26.81);History of falling (Z91.81)                Time: 8657-8469 OT Time Calculation (min): 20 min Charges:  OT General Charges $OT Visit: 1 Visit OT Evaluation $OT Eval Low Complexity: 1 Low  Horst Ostermiller H. OTR/L Supplemental OT, Department of rehab services [phone]  Taccara Bushnell R H. 09/26/2020, 9:02 AM

## 2020-09-26 NOTE — TOC Transition Note (Signed)
Transition of Care [facility]) - CM/SW Discharge Note   Patient Details  Name: Kimberly Lin MRN: [id number] Date of Birth: 01-21-1946  Transition of Care [name]) CM/SW Contact:  Angelita Ingles, RN Phone [phone]  09/26/2020, 11:36 AM   Clinical Narrative:    Patient to discharge home with [facility]. HH has been [facility] up with [facility]. DME rollator has been ordered through [place]. No other needs noted at this time. TOC will sign off.         Patient Goals and CMS Choice        Discharge Placement                       Discharge Plan and Services                                     Social Determinants of Health (SDOH) Interventions     Readmission Risk Interventions No flowsheet data found.

## 2020-10-15 IMAGING — DX PORTABLE RIGHT SHOULDER
2 series · 2 of 2 positions shown · non-contrast
Comparison: Earlier the same date and 10/29/2017.

CLINICAL DATA: Postreduction.

EXAM:
PORTABLE RIGHT SHOULDER

[shoulder ap]
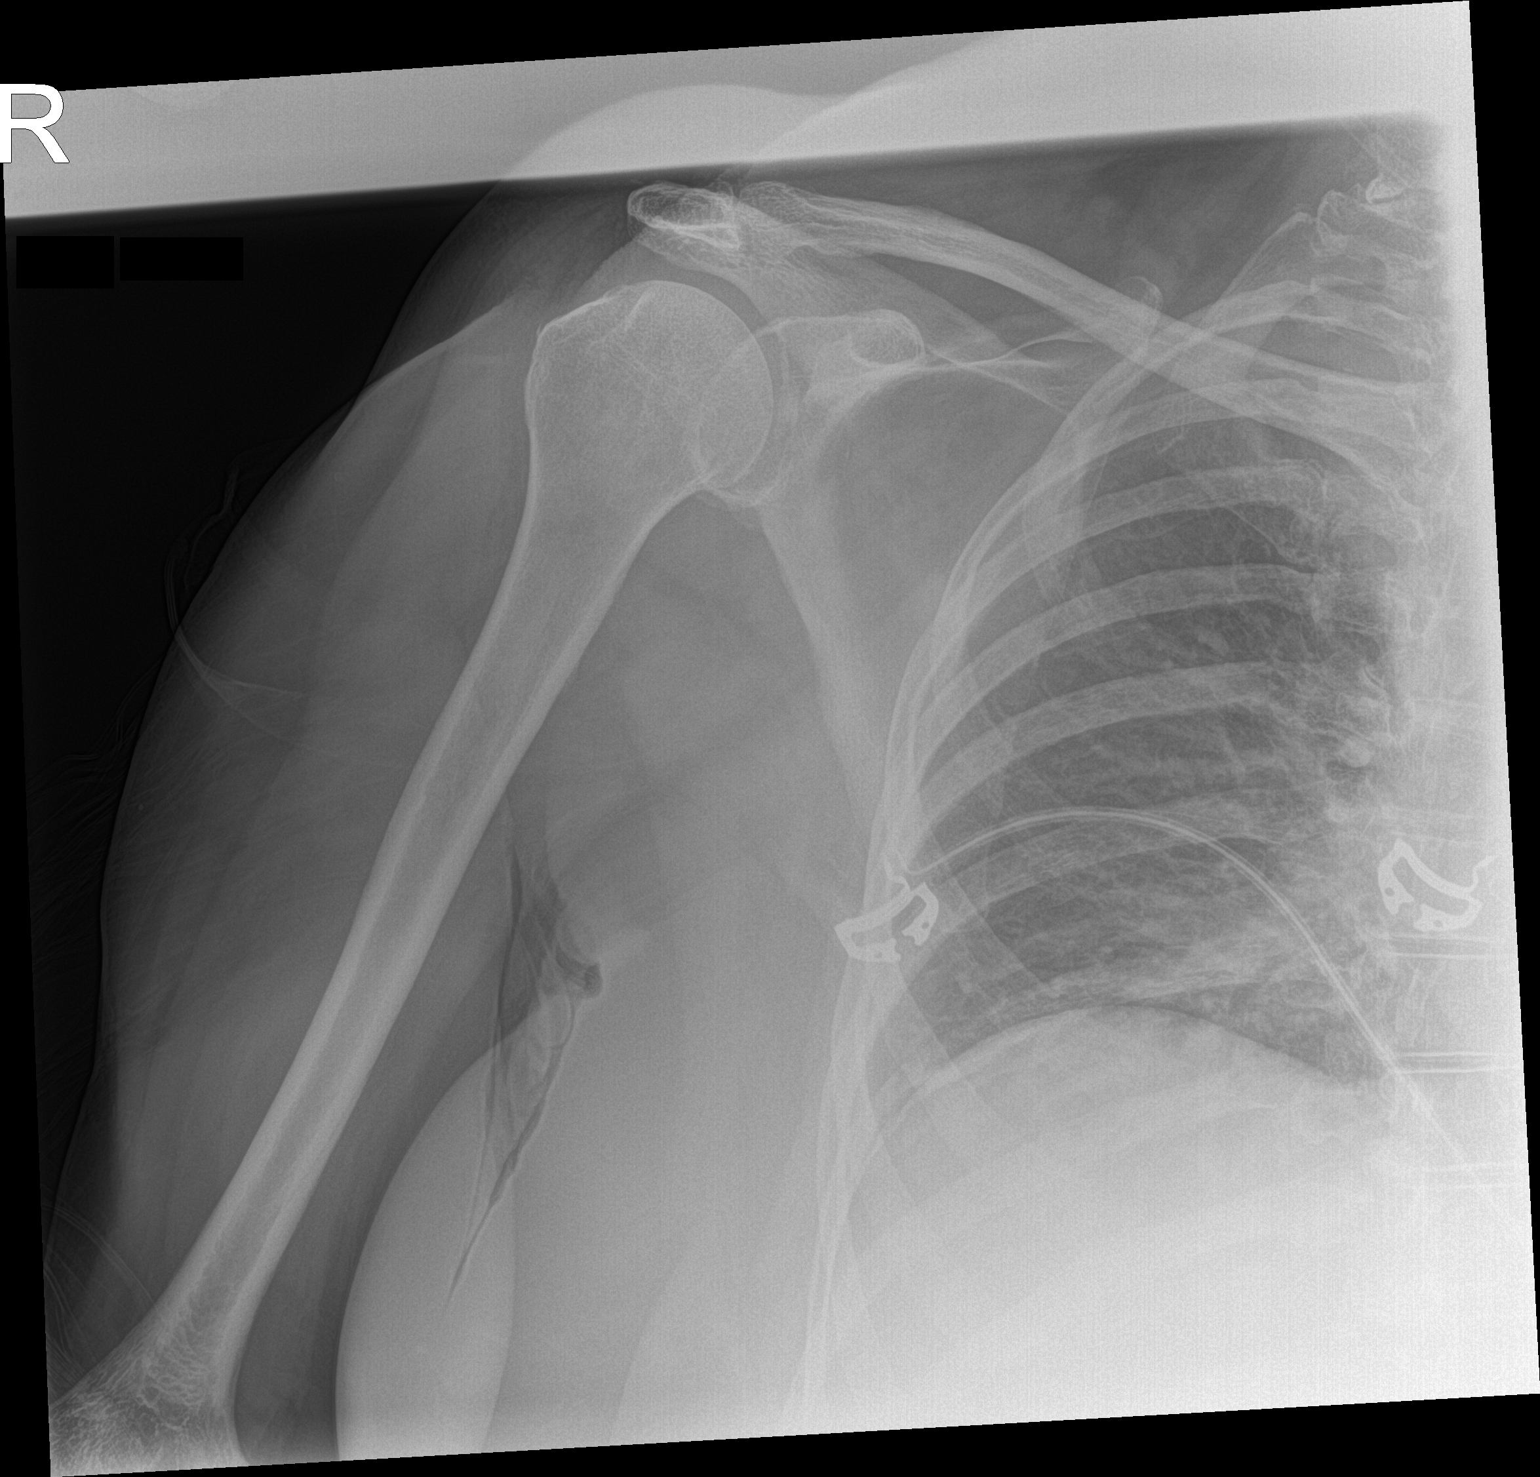

[shoulder obl]
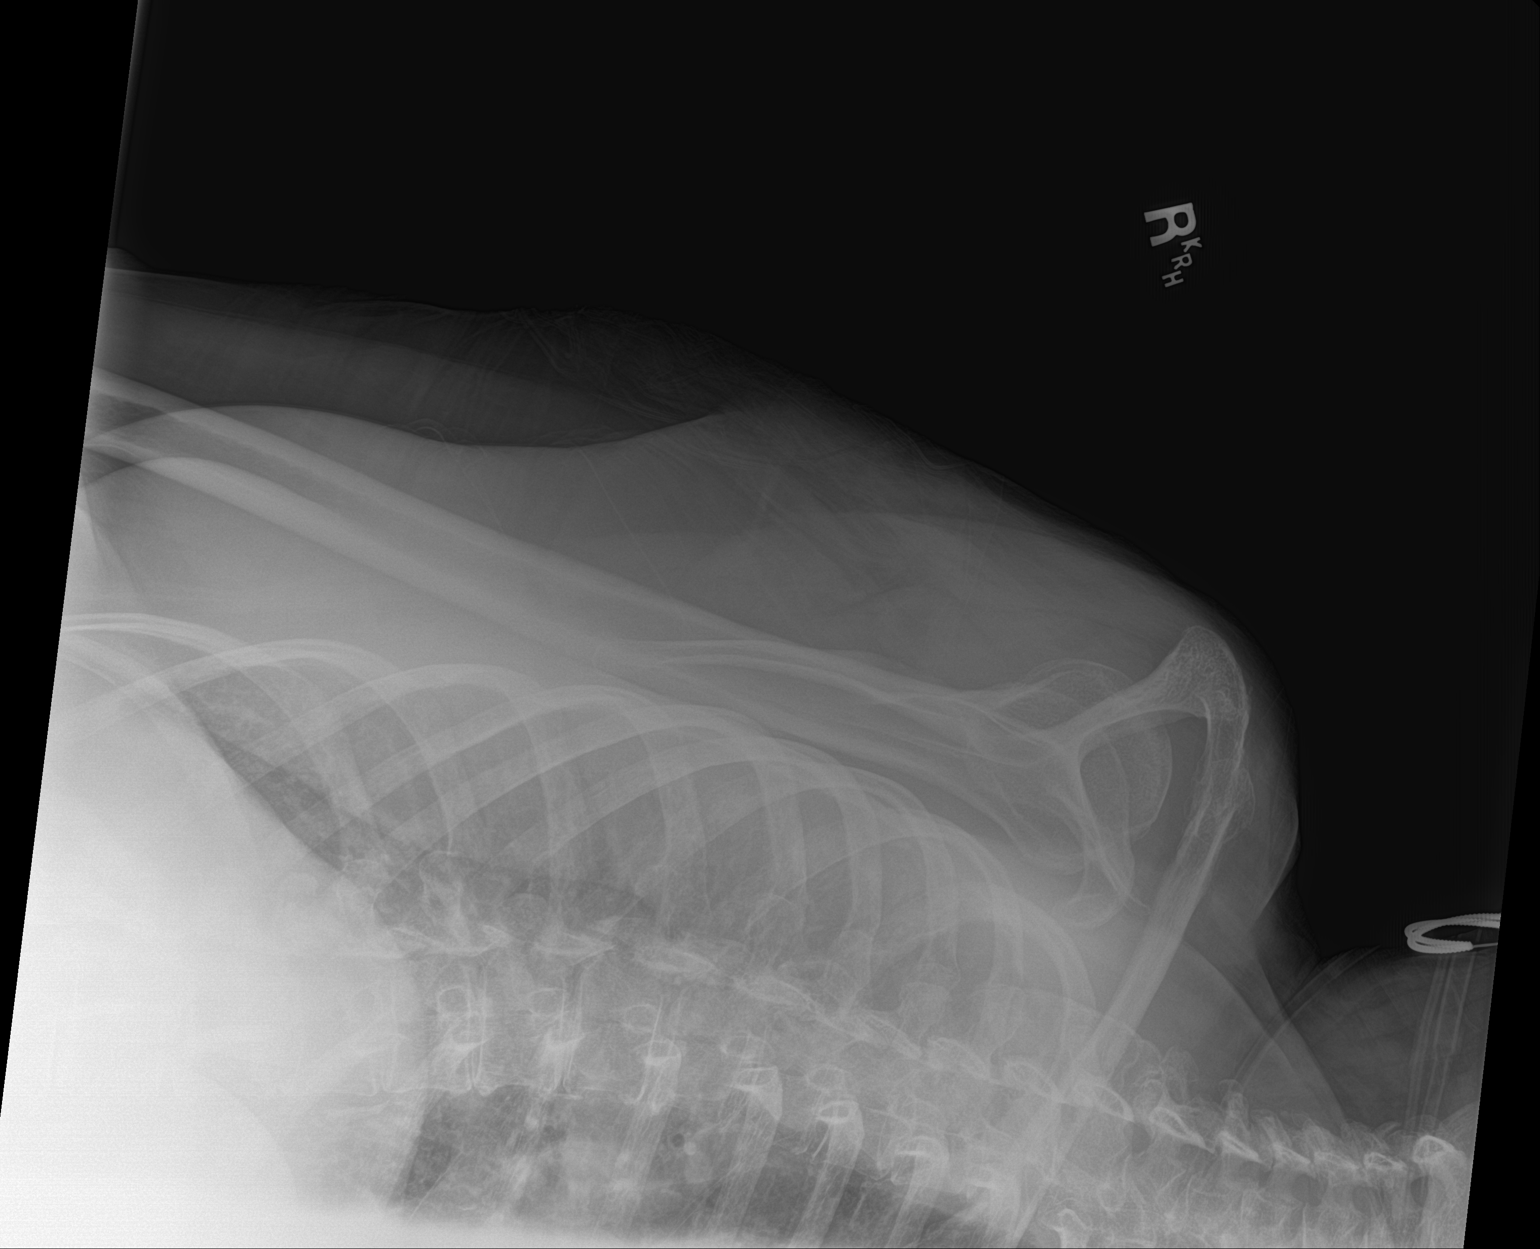

[2 of 2 positions shown; findings below may reference images not displayed]

FINDINGS: 7066 hours. The glenohumeral dislocation has been reduced. There are
possible tiny avulsion fractures along the inferior glenoid rim. The
humeral head appears stable without definite Hill-Sachs deformity.
IMPRESSION: Interval reduction of previously demonstrated glenohumeral
dislocation. Possible tiny avulsion fracture fragments along
inferior glenoid rim.

## 2020-10-23 ENCOUNTER — Telehealth (HOSPITAL_BASED_OUTPATIENT_CLINIC_OR_DEPARTMENT_OTHER): Payer: Self-pay

## 2020-10-23 ENCOUNTER — Ambulatory Visit (HOSPITAL_BASED_OUTPATIENT_CLINIC_OR_DEPARTMENT_OTHER): Payer: Medicare Other

## 2020-10-23 ENCOUNTER — Other Ambulatory Visit (HOSPITAL_BASED_OUTPATIENT_CLINIC_OR_DEPARTMENT_OTHER): Payer: Self-pay | Admitting: Family Medicine

## 2020-10-23 DIAGNOSIS — Z1231 Encounter for screening mammogram for malignant neoplasm of breast: Secondary | ICD-10-CM

## 2020-11-27 ENCOUNTER — Other Ambulatory Visit: Payer: Self-pay

## 2020-11-27 ENCOUNTER — Encounter (HOSPITAL_BASED_OUTPATIENT_CLINIC_OR_DEPARTMENT_OTHER): Payer: Self-pay

## 2020-11-27 ENCOUNTER — Ambulatory Visit (HOSPITAL_BASED_OUTPATIENT_CLINIC_OR_DEPARTMENT_OTHER)
Admission: RE | Admit: 2020-11-27 | Discharge: 2020-11-27 | Disposition: A | Discharge status: Home / Self Care | Payer: Medicare Other | Source: Ambulatory Visit | Attending: Family Medicine | Admitting: Family Medicine

## 2020-11-27 DIAGNOSIS — Z1231 Encounter for screening mammogram for malignant neoplasm of breast: Secondary | ICD-10-CM | POA: Diagnosis present

## 2020-12-09 ENCOUNTER — Emergency Department (HOSPITAL_BASED_OUTPATIENT_CLINIC_OR_DEPARTMENT_OTHER): Payer: Medicare Other

## 2020-12-09 ENCOUNTER — Emergency Department (HOSPITAL_BASED_OUTPATIENT_CLINIC_OR_DEPARTMENT_OTHER)
Admission: EM | Admit: 2020-12-09 | Discharge: 2020-12-09 | Disposition: A | Discharge status: Home / Self Care | Payer: Medicare Other | Attending: Emergency Medicine | Admitting: Emergency Medicine

## 2020-12-09 ENCOUNTER — Other Ambulatory Visit: Payer: Self-pay

## 2020-12-09 DIAGNOSIS — I10 Essential (primary) hypertension: Secondary | ICD-10-CM | POA: Insufficient documentation

## 2020-12-09 DIAGNOSIS — E119 Type 2 diabetes mellitus without complications: Secondary | ICD-10-CM | POA: Diagnosis not present

## 2020-12-09 DIAGNOSIS — M25511 Pain in right shoulder: Secondary | ICD-10-CM

## 2020-12-09 DIAGNOSIS — W19XXXA Unspecified fall, initial encounter: Secondary | ICD-10-CM | POA: Diagnosis not present

## 2020-12-09 DIAGNOSIS — Z87891 Personal history of nicotine dependence: Secondary | ICD-10-CM | POA: Diagnosis not present

## 2020-12-09 DIAGNOSIS — S4991XA Unspecified injury of right shoulder and upper arm, initial encounter: Secondary | ICD-10-CM | POA: Diagnosis present

## 2020-12-09 DIAGNOSIS — S40011A Contusion of right shoulder, initial encounter: Secondary | ICD-10-CM | POA: Insufficient documentation

## 2020-12-09 MED ORDER — FENTANYL CITRATE PF 50 MCG/ML IJ SOSY
50.0000 ug | PREFILLED_SYRINGE | Freq: Once | INTRAMUSCULAR | Status: AC
  Administered 2020-12-09: 50 ug via INTRAVENOUS
  Filled 2020-12-09: qty 1

## 2020-12-09 NOTE — ED Triage Notes (Addendum)
Right shoulder injury last night, hx of multiple shoulder dislocations.  Bruising, mild deformity noted on right shoulder.

## 2020-12-09 NOTE — ED Provider Notes (Signed)
Emergency Department Provider Note   I have reviewed the triage vital signs and the nursing notes.   HISTORY  Chief Complaint Shoulder Injury   HPI Kimberly Lin is a 75 y.o. female presents to the ED for evaluation after a fall. Patient fell last night. She reports a mechanical fall without head injury. Patient notes some bruising but no bleeding. No elbow, wrist, or neck pain. No numbness/weakness. No pain in the hip or knee. Pain is moderate and worse with movement or touching the area. Patient has had shoulder dislocations in the past and is concern that that her shoulder is out of joint again.    Past Medical History:  Diagnosis Date   Abdominal mass    Abnormal nuclear stress test 07/05/2014   Acute encephalopathy 03/26/2016   Arthritis    osteoarthritis   Colitis 2014   Diabetes mellitus    Fibromyalgia    GERD (gastroesophageal reflux disease)    Hiatal hernia    History of chicken pox    History of septic shock 12/2009   Hyperlipidemia    Hypertension    Personal history of colonic polyps    Sepsis secondary to UTI ([place]) 03/26/2016   Spinal stenosis     Patient Active Problem List   Diagnosis Date Noted   Synovial cyst of lumbar facet joint 09/25/2020   Rectal prolapse 12/30/2019   Anterior shoulder dislocation, right, initial encounter 10/29/2016   Falls 03/26/2016   Anxiety and depression 03/26/2016   Altered mental status 03/25/2016   Abnormal nuclear stress test 07/05/2014   Fatty liver 03/26/2014   Intertrigo 03/24/2014   Allergic rhinitis 01/02/2014   Incontinence, feces 11/10/2013   Musculoskeletal pain 11/08/2013   Dysuria 04/16/2013   Benign paroxysmal positional vertigo 07/19/2012   Obese 09/17/2011   Knee pain 09/17/2011   Weight gain 06/17/2011   DOE (dyspnea on exertion) 04/15/2011   OSA (obstructive sleep apnea) 03/12/2011   Insomnia 01/15/2011   Diabetes type 2, uncontrolled 11/27/2010   PVC (premature ventricular contraction)  11/27/2010   Fibromyalgia    Depression    GERD (gastroesophageal reflux disease)    Hyperlipidemia    Hypertension    Personal history of colonic polyps    Migraine     Past Surgical History:  Procedure Laterality Date   APPENDECTOMY  1983   BREAST BIOPSY Right 2007, 2009   CARDIAC CATHETERIZATION N/A 07/05/2014   Procedure: Left Heart Cath and Coronary Angiography;  Surgeon: Sherren Mocha, MD;  Location: [facility];  Service: Cardiovascular;  Laterality: N/A;   COLONOSCOPY  2014   normal    ESOPHAGOGASTRODUODENOSCOPY     in [place]   LUMBAR LAMINECTOMY/DECOMPRESSION MICRODISCECTOMY Left 09/25/2020   Procedure: Laminectomy for facet/synovial cyst - left - Lumbar four-Lumbar five;  Surgeon: Earnie Larsson, MD;  Location: [facility];  Service: Neurosurgery;  Laterality: Left;   MEDIAL PARTIAL KNEE REPLACEMENT Bilateral 2006   OVARIAN CYST REMOVAL  2008   Pt states she had a ?cyst removed ?ovaries   POLYPECTOMY  1998   XI ROBOT ASSISTED RECTOPEXY N/A 12/30/2019   Procedure: XI ROBOT ASSISTED RECTOPEXY;  Surgeon: Leighton Ruff, MD;  Location: [facility];  Service: General;  Laterality: N/A;    Allergies Erythromycin, Losartan, and Prednisone  Family History  Problem Relation Age of Onset   Arthritis Mother    Hyperlipidemia Mother    Hypertension Mother    Diabetes Mother    Heart disease Mother    Breast  cancer Mother    Lung cancer Brother    Heart disease Brother        x 2   Hypertension Brother    Hyperlipidemia Brother    Diabetes Brother    Sudden death Father    Lung cancer Father    Rectal cancer Father    Diabetes Sister    Hyperlipidemia Sister    Hypertension Sister    Diabetes Maternal Aunt    Heart attack Maternal Aunt    Hyperlipidemia Maternal Aunt    Hypertension Maternal Aunt    Hypertension Maternal Uncle    Hyperlipidemia Maternal Uncle    Heart attack Maternal Uncle    Diabetes Maternal Uncle    Diabetes Paternal Aunt    Heart attack  Paternal Aunt    Hyperlipidemia Paternal Aunt    Hypertension Paternal Aunt    Hypertension Paternal Uncle    Hyperlipidemia Paternal Uncle    Heart attack Paternal Uncle    Diabetes Paternal Uncle    Prostate cancer Brother    Stomach cancer Neg Hx    Esophageal cancer Neg Hx     Social History Social History   Tobacco Use   Smoking status: Former    Types: Cigarettes   Smokeless tobacco: Never   Tobacco comments:    Smoked socially.  Vaping Use   Vaping Use: Never used  Substance Use Topics   Alcohol use: Not Currently   Drug use: No    Review of Systems  Constitutional: No fever/chills Eyes: No visual changes. ENT: No sore throat. Cardiovascular: Denies chest pain. Respiratory: Denies shortness of breath. Gastrointestinal: No abdominal pain.  No nausea, no vomiting.  No diarrhea.  No constipation. Genitourinary: Negative for dysuria. Musculoskeletal: Negative for back pain. Positive right shoulder pain.  Skin: Negative for rash. Neurological: Negative for headaches, focal weakness or numbness.  10-point ROS otherwise negative.  ____________________________________________   PHYSICAL EXAM:  VITAL SIGNS: ED Triage Vitals  Enc Vitals Group     BP 12/09/20 1151 (!) 198/93     Pulse Rate 12/09/20 1151 94     Resp 12/09/20 1151 18     Temp 12/09/20 1151 97.9 F (36.6 C)     Temp Source 12/09/20 1151 Oral     SpO2 12/09/20 1151 97 %     Weight 12/09/20 1152 157 lb 13.6 oz (71.6 kg)     Height 12/09/20 1152 5' 3.5" (1.613 m)   Constitutional: Alert and oriented. Well appearing and in no acute distress. Eyes: Conjunctivae are normal.  Head: Atraumatic. Nose: No congestion/rhinnorhea. Mouth/Throat: Mucous membranes are moist.  Neck: No stridor.  No cervical spine tenderness to palpation. Cardiovascular: Normal rate, regular rhythm. Good peripheral circulation. Grossly normal heart sounds.   Respiratory: Normal respiratory effort.  No retractions. Lungs  CTAB. Gastrointestinal: Soft and nontender. No distention.  Musculoskeletal: No lower extremity tenderness nor edema. Positive right shoulder pain with ROM but full ROM possible on exam. No elbow or wrist tenderness.  Neurologic:  Normal speech and language. No gross focal neurologic deficits are appreciated.  Skin:  Skin is warm, dry and intact. Right shoulder bruising noted. No skin tinting.    ____________________________________________  RADIOLOGY  Shoulder x-ray reviewed.   ____________________________________________   PROCEDURES  Procedure(s) performed:   Procedures  None ____________________________________________   INITIAL IMPRESSION / ASSESSMENT AND PLAN / ED COURSE  Pertinent labs & imaging results that were available during my care of the patient were reviewed by me and considered  in my medical decision making (see chart for details).   Patient presents to the ED with clinical concern for humerus fx vs dislocation. Plain films show no dislocation. 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 No skin tinting. Some bruising noted. Sling provided for comfort with plan made for ortho follow up.    ____________________________________________  FINAL CLINICAL IMPRESSION(S) / ED DIAGNOSES  Final diagnoses:  Acute pain of right shoulder  Fall, initial encounter     MEDICATIONS GIVEN DURING THIS VISIT:  Medications  fentaNYL (SUBLIMAZE) injection 50 mcg (50 mcg Intravenous Given 12/09/20 1216)      Note:  This document was prepared using Dragon voice recognition software and may include unintentional dictation errors.  Nanda Quinton, MD, [facility] Emergency Medicine    Jamere Stidham, [facility], MD 12/16/20 1352

## 2020-12-09 NOTE — Discharge Instructions (Signed)
You were seen in the emerge department today after a fall.  Your x-ray showed a an old clavicle fracture but no joint dislocation or other broken bone.  I have placed you in a sling for comfort.  Please remove the sling and perform range of motion exercises at least once every hour while awake.  Please call your orthopedic doctors.  I have listed their contact information on the form here for you.  Take your home pain medications and return with any new or suddenly worsening symptoms.

## 2021-04-20 ENCOUNTER — Emergency Department (HOSPITAL_BASED_OUTPATIENT_CLINIC_OR_DEPARTMENT_OTHER): Payer: Medicare Other

## 2021-04-20 ENCOUNTER — Observation Stay (HOSPITAL_COMMUNITY): Payer: Medicare Other

## 2021-04-20 ENCOUNTER — Encounter (HOSPITAL_BASED_OUTPATIENT_CLINIC_OR_DEPARTMENT_OTHER): Payer: Self-pay

## 2021-04-20 ENCOUNTER — Other Ambulatory Visit: Payer: Self-pay

## 2021-04-20 ENCOUNTER — Inpatient Hospital Stay (HOSPITAL_BASED_OUTPATIENT_CLINIC_OR_DEPARTMENT_OTHER)
Admission: EM | Admit: 2021-04-20 | Discharge: 2021-04-23 | DRG: 177 | Disposition: A | Discharge status: Home / Self Care | Payer: Medicare Other | Attending: Internal Medicine | Admitting: Internal Medicine

## 2021-04-20 DIAGNOSIS — R778 Other specified abnormalities of plasma proteins: Secondary | ICD-10-CM | POA: Diagnosis present

## 2021-04-20 DIAGNOSIS — K219 Gastro-esophageal reflux disease without esophagitis: Secondary | ICD-10-CM | POA: Diagnosis present

## 2021-04-20 DIAGNOSIS — Z800 Family history of malignant neoplasm of digestive organs: Secondary | ICD-10-CM | POA: Diagnosis not present

## 2021-04-20 DIAGNOSIS — W01198A Fall on same level from slipping, tripping and stumbling with subsequent striking against other object, initial encounter: Secondary | ICD-10-CM | POA: Diagnosis not present

## 2021-04-20 DIAGNOSIS — G4733 Obstructive sleep apnea (adult) (pediatric): Secondary | ICD-10-CM | POA: Diagnosis present

## 2021-04-20 DIAGNOSIS — F419 Anxiety disorder, unspecified: Secondary | ICD-10-CM | POA: Diagnosis not present

## 2021-04-20 DIAGNOSIS — F32A Depression, unspecified: Secondary | ICD-10-CM | POA: Diagnosis present

## 2021-04-20 DIAGNOSIS — Z803 Family history of malignant neoplasm of breast: Secondary | ICD-10-CM

## 2021-04-20 DIAGNOSIS — I248 Other forms of acute ischemic heart disease: Secondary | ICD-10-CM | POA: Diagnosis not present

## 2021-04-20 DIAGNOSIS — N393 Stress incontinence (female) (male): Secondary | ICD-10-CM | POA: Diagnosis present

## 2021-04-20 DIAGNOSIS — M6282 Rhabdomyolysis: Secondary | ICD-10-CM | POA: Diagnosis not present

## 2021-04-20 DIAGNOSIS — Z7984 Long term (current) use of oral hypoglycemic drugs: Secondary | ICD-10-CM

## 2021-04-20 DIAGNOSIS — Z8261 Family history of arthritis: Secondary | ICD-10-CM | POA: Diagnosis not present

## 2021-04-20 DIAGNOSIS — I10 Essential (primary) hypertension: Secondary | ICD-10-CM | POA: Diagnosis present

## 2021-04-20 DIAGNOSIS — I2510 Atherosclerotic heart disease of native coronary artery without angina pectoris: Secondary | ICD-10-CM

## 2021-04-20 DIAGNOSIS — M797 Fibromyalgia: Secondary | ICD-10-CM | POA: Diagnosis not present

## 2021-04-20 DIAGNOSIS — Z881 Allergy status to other antibiotic agents status: Secondary | ICD-10-CM | POA: Diagnosis not present

## 2021-04-20 DIAGNOSIS — Z83438 Family history of other disorder of lipoprotein metabolism and other lipidemia: Secondary | ICD-10-CM | POA: Diagnosis not present

## 2021-04-20 DIAGNOSIS — U071 COVID-19: Secondary | ICD-10-CM | POA: Diagnosis present

## 2021-04-20 DIAGNOSIS — Z7982 Long term (current) use of aspirin: Secondary | ICD-10-CM

## 2021-04-20 DIAGNOSIS — Z801 Family history of malignant neoplasm of trachea, bronchus and lung: Secondary | ICD-10-CM | POA: Diagnosis not present

## 2021-04-20 DIAGNOSIS — Z8249 Family history of ischemic heart disease and other diseases of the circulatory system: Secondary | ICD-10-CM

## 2021-04-20 DIAGNOSIS — Z888 Allergy status to other drugs, medicaments and biological substances status: Secondary | ICD-10-CM

## 2021-04-20 DIAGNOSIS — J9601 Acute respiratory failure with hypoxia: Secondary | ICD-10-CM | POA: Diagnosis not present

## 2021-04-20 DIAGNOSIS — S32050A Wedge compression fracture of fifth lumbar vertebra, initial encounter for closed fracture: Secondary | ICD-10-CM | POA: Insufficient documentation

## 2021-04-20 DIAGNOSIS — I252 Old myocardial infarction: Secondary | ICD-10-CM

## 2021-04-20 DIAGNOSIS — E1165 Type 2 diabetes mellitus with hyperglycemia: Secondary | ICD-10-CM | POA: Diagnosis present

## 2021-04-20 DIAGNOSIS — Z87891 Personal history of nicotine dependence: Secondary | ICD-10-CM

## 2021-04-20 DIAGNOSIS — R739 Hyperglycemia, unspecified: Secondary | ICD-10-CM

## 2021-04-20 DIAGNOSIS — E785 Hyperlipidemia, unspecified: Secondary | ICD-10-CM | POA: Diagnosis present

## 2021-04-20 DIAGNOSIS — Z79899 Other long term (current) drug therapy: Secondary | ICD-10-CM

## 2021-04-20 DIAGNOSIS — Z8042 Family history of malignant neoplasm of prostate: Secondary | ICD-10-CM | POA: Diagnosis not present

## 2021-04-20 DIAGNOSIS — Z833 Family history of diabetes mellitus: Secondary | ICD-10-CM

## 2021-04-20 DIAGNOSIS — M25551 Pain in right hip: Secondary | ICD-10-CM | POA: Diagnosis present

## 2021-04-20 DIAGNOSIS — W19XXXA Unspecified fall, initial encounter: Secondary | ICD-10-CM

## 2021-04-20 DIAGNOSIS — R7989 Other specified abnormal findings of blood chemistry: Secondary | ICD-10-CM | POA: Diagnosis present

## 2021-04-20 DIAGNOSIS — J9600 Acute respiratory failure, unspecified whether with hypoxia or hypercapnia: Secondary | ICD-10-CM | POA: Diagnosis present

## 2021-04-20 DIAGNOSIS — E663 Overweight: Secondary | ICD-10-CM | POA: Diagnosis present

## 2021-04-20 DIAGNOSIS — Z6826 Body mass index (BMI) 26.0-26.9, adult: Secondary | ICD-10-CM

## 2021-04-20 LAB — HEPATIC FUNCTION PANEL
ALT: 26 U/L (ref 0–44)
AST: 73 U/L — ABNORMAL HIGH (ref 15–41)
Albumin: 3.3 g/dL — ABNORMAL LOW (ref 3.5–5.0)
Alkaline Phosphatase: 48 U/L (ref 38–126)
Bilirubin, Direct: 0.1 mg/dL (ref 0.0–0.2)
Indirect Bilirubin: 0.3 mg/dL (ref 0.3–0.9)
Total Bilirubin: 0.4 mg/dL (ref 0.3–1.2)
Total Protein: 6.5 g/dL (ref 6.5–8.1)

## 2021-04-20 LAB — CBC WITH DIFFERENTIAL/PLATELET
Abs Immature Granulocytes: 0.05 10*3/uL (ref 0.00–0.07)
Basophils Absolute: 0 10*3/uL (ref 0.0–0.1)
Basophils Relative: 0 %
Eosinophils Absolute: 0 10*3/uL (ref 0.0–0.5)
Eosinophils Relative: 0 %
HCT: 42.6 % (ref 36.0–46.0)
Hemoglobin: 14.6 g/dL (ref 12.0–15.0)
Immature Granulocytes: 1 %
Lymphocytes Relative: 6 %
Lymphs Abs: 0.7 10*3/uL (ref 0.7–4.0)
MCH: 30.1 pg (ref 26.0–34.0)
MCHC: 34.3 g/dL (ref 30.0–36.0)
MCV: 87.8 fL (ref 80.0–100.0)
Monocytes Absolute: 1 10*3/uL (ref 0.1–1.0)
Monocytes Relative: 9 %
Neutro Abs: 9.2 10*3/uL — ABNORMAL HIGH (ref 1.7–7.7)
Neutrophils Relative %: 84 %
Platelets: 175 10*3/uL (ref 150–400)
RBC: 4.85 MIL/uL (ref 3.87–5.11)
RDW: 12.9 % (ref 11.5–15.5)
WBC: 11 10*3/uL — ABNORMAL HIGH (ref 4.0–10.5)
nRBC: 0 % (ref 0.0–0.2)

## 2021-04-20 LAB — RESP PANEL BY RT-PCR (FLU A&B, COVID) ARPGX2
Influenza A by PCR: NEGATIVE
Influenza B by PCR: NEGATIVE
SARS Coronavirus 2 by RT PCR: POSITIVE — AB

## 2021-04-20 LAB — BASIC METABOLIC PANEL
Anion gap: 12 (ref 5–15)
BUN: 21 mg/dL (ref 8–23)
CO2: 21 mmol/L — ABNORMAL LOW (ref 22–32)
Calcium: 8.2 mg/dL — ABNORMAL LOW (ref 8.9–10.3)
Chloride: 100 mmol/L (ref 98–111)
Creatinine, Ser: 0.97 mg/dL (ref 0.44–1.00)
GFR, Estimated: >60 mL/min (ref >60)
Glucose, Bld: 215 mg/dL — ABNORMAL HIGH (ref 70–99)
Potassium: 3.1 mmol/L — ABNORMAL LOW (ref 3.5–5.1)
Sodium: 133 mmol/L — ABNORMAL LOW (ref 135–145)

## 2021-04-20 LAB — GLUCOSE, CAPILLARY
Glucose-Capillary: 163 mg/dL — ABNORMAL HIGH (ref 70–99)
Glucose-Capillary: 148 mg/dL — ABNORMAL HIGH (ref 70–99)
Glucose-Capillary: 194 mg/dL — ABNORMAL HIGH (ref 70–99)

## 2021-04-20 LAB — CK: Total CK: 2498 U/L — ABNORMAL HIGH (ref 38–234)

## 2021-04-20 LAB — TROPONIN I (HIGH SENSITIVITY)
Troponin I (High Sensitivity): 53 ng/L — ABNORMAL HIGH (ref <18)
Troponin I (High Sensitivity): 62 ng/L — ABNORMAL HIGH (ref <18)

## 2021-04-20 LAB — PROCALCITONIN: Procalcitonin: 0.98 ng/mL

## 2021-04-20 LAB — D-DIMER, QUANTITATIVE: D-Dimer, Quant: 1.05 ug/mL-FEU — ABNORMAL HIGH (ref 0.00–0.50)

## 2021-04-20 LAB — FERRITIN: Ferritin: 196 ng/mL (ref 11–307)

## 2021-04-20 LAB — C-REACTIVE PROTEIN: CRP: 10.7 mg/dL — ABNORMAL HIGH (ref <1.0)

## 2021-04-20 MED ORDER — BUDESONIDE 0.5 MG/2ML IN SUSP
0.5000 mg | Freq: Two times a day (BID) | RESPIRATORY_TRACT | Status: DC
  Administered 2021-04-20 – 2021-04-22 (×6): 0.5 mg via RESPIRATORY_TRACT
  Filled 2021-04-20 (×7): qty 2

## 2021-04-20 MED ORDER — DEXTROMETHORPHAN-GUAIFENESIN 10-100 MG/5ML PO LIQD
10.0000 mL | ORAL | Status: DC | PRN
  Filled 2021-04-20: qty 10

## 2021-04-20 MED ORDER — NIRMATRELVIR/RITONAVIR (PAXLOVID)TABLET
3.0000 | ORAL_TABLET | Freq: Two times a day (BID) | ORAL | Status: DC
Start: 2021-04-20 — End: 2021-04-23
  Administered 2021-04-20 – 2021-04-23 (×7): 3 via ORAL
  Filled 2021-04-20: qty 30

## 2021-04-20 MED ORDER — ACETAMINOPHEN 325 MG PO TABS: 325.0000 mg | ORAL_TABLET | Freq: Three times a day (TID) | ORAL | Status: DC | PRN

## 2021-04-20 MED ORDER — LACTATED RINGERS IV SOLN
INTRAVENOUS | Status: DC
Start: 2021-04-20 — End: 2021-04-21

## 2021-04-20 MED ORDER — POTASSIUM CHLORIDE CRYS ER 20 MEQ PO TBCR
40.0000 meq | EXTENDED_RELEASE_TABLET | Freq: Once | ORAL | Status: AC
  Administered 2021-04-20: 40 meq via ORAL
  Filled 2021-04-20: qty 2

## 2021-04-20 MED ORDER — IPRATROPIUM-ALBUTEROL 0.5-2.5 (3) MG/3ML IN SOLN
3.0000 mL | Freq: Four times a day (QID) | RESPIRATORY_TRACT | Status: DC
  Administered 2021-04-20: 3 mL via RESPIRATORY_TRACT
  Filled 2021-04-20: qty 3

## 2021-04-20 MED ORDER — IPRATROPIUM-ALBUTEROL 0.5-2.5 (3) MG/3ML IN SOLN: 3.0000 mL | RESPIRATORY_TRACT | Status: DC | PRN

## 2021-04-20 MED ORDER — DIAZEPAM 5 MG PO TABS
10.0000 mg | ORAL_TABLET | ORAL | Status: DC
  Administered 2021-04-20 – 2021-04-22 (×3): 10 mg via ORAL
  Filled 2021-04-20 (×3): qty 2

## 2021-04-20 MED ORDER — INSULIN REGULAR HUMAN 100 UNIT/ML IJ SOLN: 10.0000 [IU] | Freq: Once | INTRAMUSCULAR | Status: DC

## 2021-04-20 MED ORDER — PANTOPRAZOLE SODIUM 40 MG PO TBEC
40.0000 mg | DELAYED_RELEASE_TABLET | Freq: Every day | ORAL | Status: DC
  Administered 2021-04-20 – 2021-04-22 (×3): 40 mg via ORAL
  Filled 2021-04-20 (×3): qty 1

## 2021-04-20 MED ORDER — AMLODIPINE BESYLATE 5 MG PO TABS
5.0000 mg | ORAL_TABLET | Freq: Every day | ORAL | Status: DC
  Administered 2021-04-20 – 2021-04-23 (×4): 5 mg via ORAL
  Filled 2021-04-20 (×4): qty 1

## 2021-04-20 MED ORDER — ASCORBIC ACID 500 MG PO TABS
500.0000 mg | ORAL_TABLET | Freq: Every day | ORAL | Status: DC
  Administered 2021-04-20 – 2021-04-23 (×4): 500 mg via ORAL
  Filled 2021-04-20 (×4): qty 1

## 2021-04-20 MED ORDER — ONDANSETRON HCL 4 MG/2ML IJ SOLN
4.0000 mg | Freq: Four times a day (QID) | INTRAMUSCULAR | Status: DC | PRN
Start: 2021-04-20 — End: 2021-04-23

## 2021-04-20 MED ORDER — VENLAFAXINE HCL ER 150 MG PO CP24
150.0000 mg | ORAL_CAPSULE | Freq: Every morning | ORAL | Status: DC
  Administered 2021-04-21 – 2021-04-23 (×3): 150 mg via ORAL
  Filled 2021-04-20 (×3): qty 1

## 2021-04-20 MED ORDER — ENOXAPARIN SODIUM 40 MG/0.4ML IJ SOSY
40.0000 mg | PREFILLED_SYRINGE | INTRAMUSCULAR | Status: DC
  Administered 2021-04-20: 40 mg via SUBCUTANEOUS
  Filled 2021-04-20: qty 0.4

## 2021-04-20 MED ORDER — OXYCODONE HCL 5 MG PO TABS
10.0000 mg | ORAL_TABLET | Freq: Three times a day (TID) | ORAL | Status: DC | PRN
  Administered 2021-04-20 – 2021-04-21 (×3): 10 mg via ORAL
  Filled 2021-04-20 (×3): qty 2

## 2021-04-20 MED ORDER — SODIUM CHLORIDE 0.9 % IV SOLN: INTRAVENOUS | Status: DC

## 2021-04-20 MED ORDER — SODIUM CHLORIDE 0.9 % IV SOLN
1.0000 g | INTRAVENOUS | Status: DC
  Administered 2021-04-20: 1 g via INTRAVENOUS
  Filled 2021-04-20: qty 10

## 2021-04-20 MED ORDER — ACETAMINOPHEN 650 MG RE SUPP: 650.0000 mg | Freq: Four times a day (QID) | RECTAL | Status: DC | PRN

## 2021-04-20 MED ORDER — BUDESONIDE 3 MG PO CPEP
6.0000 mg | ORAL_CAPSULE | Freq: Every day | ORAL | Status: DC
  Administered 2021-04-20 – 2021-04-23 (×4): 6 mg via ORAL
  Filled 2021-04-20 (×4): qty 2

## 2021-04-20 MED ORDER — DIPHENHYDRAMINE HCL 25 MG PO CAPS
50.0000 mg | ORAL_CAPSULE | Freq: Every day | ORAL | Status: DC
  Administered 2021-04-20 – 2021-04-22 (×3): 50 mg via ORAL
  Filled 2021-04-20 (×3): qty 2

## 2021-04-20 MED ORDER — BALSALAZIDE DISODIUM 750 MG PO CAPS
2250.0000 mg | ORAL_CAPSULE | Freq: Two times a day (BID) | ORAL | Status: DC
  Administered 2021-04-20 – 2021-04-23 (×6): 2250 mg via ORAL
  Filled 2021-04-20 (×6): qty 3

## 2021-04-20 MED ORDER — IPRATROPIUM-ALBUTEROL 0.5-2.5 (3) MG/3ML IN SOLN
3.0000 mL | Freq: Three times a day (TID) | RESPIRATORY_TRACT | Status: DC
  Administered 2021-04-20 – 2021-04-21 (×4): 3 mL via RESPIRATORY_TRACT
  Filled 2021-04-20 (×4): qty 3

## 2021-04-20 MED ORDER — POTASSIUM CHLORIDE IN NACL 40-0.9 MEQ/L-% IV SOLN
INTRAVENOUS | Status: AC
Start: 2021-04-20 — End: 2021-04-20
  Filled 2021-04-20 (×2): qty 1000

## 2021-04-20 MED ORDER — ZOLPIDEM TARTRATE 5 MG PO TABS
5.0000 mg | ORAL_TABLET | Freq: Every evening | ORAL | Status: DC | PRN
  Administered 2021-04-20 – 2021-04-21 (×2): 5 mg via ORAL
  Filled 2021-04-20 (×2): qty 1

## 2021-04-20 MED ORDER — ASPIRIN EC 81 MG PO TBEC
81.0000 mg | DELAYED_RELEASE_TABLET | Freq: Every day | ORAL | Status: DC
  Administered 2021-04-20 – 2021-04-22 (×3): 81 mg via ORAL
  Filled 2021-04-20 (×3): qty 1

## 2021-04-20 MED ORDER — METOPROLOL TARTRATE 25 MG PO TABS
25.0000 mg | ORAL_TABLET | Freq: Every day | ORAL | Status: DC
  Administered 2021-04-20 – 2021-04-23 (×4): 25 mg via ORAL
  Filled 2021-04-20 (×4): qty 1

## 2021-04-20 MED ORDER — ZINC SULFATE 220 (50 ZN) MG PO CAPS
220.0000 mg | ORAL_CAPSULE | Freq: Every day | ORAL | Status: DC
  Administered 2021-04-20 – 2021-04-23 (×4): 220 mg via ORAL
  Filled 2021-04-20 (×4): qty 1

## 2021-04-20 MED ORDER — ONDANSETRON HCL 4 MG PO TABS: 4.0000 mg | ORAL_TABLET | Freq: Four times a day (QID) | ORAL | Status: DC | PRN

## 2021-04-20 MED ORDER — TRAZODONE HCL 100 MG PO TABS
100.0000 mg | ORAL_TABLET | Freq: Every day | ORAL | Status: DC
  Administered 2021-04-20 – 2021-04-22 (×3): 100 mg via ORAL
  Filled 2021-04-20 (×3): qty 1

## 2021-04-20 MED ORDER — ATORVASTATIN CALCIUM 40 MG PO TABS: 40.0000 mg | ORAL_TABLET | Freq: Every day | ORAL | Status: DC

## 2021-04-20 MED ORDER — BALSALAZIDE DISODIUM 750 MG PO CAPS: 2250.0000 mg | ORAL_CAPSULE | Freq: Every day | ORAL | Status: DC

## 2021-04-20 MED ORDER — SODIUM CHLORIDE 0.9 % IV SOLN
100.0000 mg | Freq: Two times a day (BID) | INTRAVENOUS | Status: DC
  Administered 2021-04-20 – 2021-04-21 (×2): 100 mg via INTRAVENOUS
  Filled 2021-04-20 (×3): qty 100

## 2021-04-20 MED ORDER — METFORMIN HCL 500 MG PO TABS
500.0000 mg | ORAL_TABLET | Freq: Two times a day (BID) | ORAL | Status: DC
  Administered 2021-04-20 – 2021-04-23 (×6): 500 mg via ORAL
  Filled 2021-04-20 (×6): qty 1

## 2021-04-20 MED ORDER — METOPROLOL SUCCINATE ER 50 MG PO TB24
50.0000 mg | ORAL_TABLET | Freq: Every day | ORAL | Status: DC
  Administered 2021-04-20 – 2021-04-23 (×4): 50 mg via ORAL
  Filled 2021-04-20 (×4): qty 1

## 2021-04-20 MED ORDER — ACETAMINOPHEN 325 MG PO TABS
650.0000 mg | ORAL_TABLET | Freq: Four times a day (QID) | ORAL | Status: DC | PRN
Start: 2021-04-20 — End: 2021-04-23

## 2021-04-20 MED ORDER — AMLODIPINE BESYLATE 10 MG PO TABS: 10.0000 mg | ORAL_TABLET | Freq: Every day | ORAL | Status: DC

## 2021-04-20 MED ORDER — INSULIN ASPART 100 UNIT/ML IJ SOLN
0.0000 [IU] | Freq: Three times a day (TID) | INTRAMUSCULAR | Status: DC
  Administered 2021-04-20: 2 [IU] via SUBCUTANEOUS
  Administered 2021-04-20: 3 [IU] via SUBCUTANEOUS

## 2021-04-20 MED ORDER — MELATONIN 5 MG PO TABS
5.0000 mg | ORAL_TABLET | Freq: Once | ORAL | Status: AC
  Administered 2021-04-20: 5 mg via ORAL
  Filled 2021-04-20: qty 1

## 2021-04-20 MED ORDER — OXYCODONE-ACETAMINOPHEN 10-325 MG PO TABS: 1.0000 | ORAL_TABLET | Freq: Three times a day (TID) | ORAL | Status: DC | PRN

## 2021-04-20 NOTE — ED Triage Notes (Signed)
Pt to er room number 1, pt states that she is here for congestion.  States that she has a scratchy throat, states that she had an abx at the home and took it yesterday and today, states that it is augmentin.  Pt states that she has also been dizzy since Friday, states that she fell this am and was down in the bathroom for 5 hrs this am. Pt has some redness to knees. Pt able to stand without assistance, but is generally weak.  Pt sinus/ sinus tach with ectopy on monitor.  ?

## 2021-04-20 NOTE — ED Provider Notes (Signed)
[facility] EMERGENCY DEPARTMENT Provider Note   CSN: [id number] Arrival date & time: 04/20/21  0701     History  Chief Complaint  Patient presents with   Weakness    Kimberly Lin is a 76 y.o. female.  Pt is a 76 yo female presenting to ED for generalized weakness, sore throat, voice changes, chest heaviness, and cough. Admits to fever of 101 F at home. Was prescribed Augmentin and has completed 2 days. Denies chest pain. Denies lower extremity swelling. Denies sick contacts.    The history is provided by the patient. No language interpreter was used.  Weakness Associated symptoms: cough   Associated symptoms: no abdominal pain, no arthralgias, no chest pain, no dysuria, no fever, no seizures, no shortness of breath and no vomiting       Home Medications Prior to Admission medications   Medication Sig Start Date End Date Taking? Authorizing Provider  acetaminophen (TYLENOL) 500 MG tablet Take 1,000 mg by mouth every 6 (six) hours as needed for moderate pain.    [provider]  amLODipine (NORVASC) 10 MG tablet Take 1 tablet (10 mg total) by mouth daily. 04/24/17   Debbrah Alar, NP  aspirin 81 MG tablet Take 81 mg by mouth at bedtime.    [provider]  atorvastatin (LIPITOR) 40 MG tablet Take 1 tablet (40 mg total) by mouth at bedtime. 09/26/20   Viona Gilmore D, NP  balsalazide (COLAZAL) 750 MG capsule Take 2,250 mg by mouth in the morning and at bedtime.    [provider]  budesonide (ENTOCORT EC) 3 MG 24 hr capsule Take 6 mg by mouth daily.    [provider]  diphenhydrAMINE (BENADRYL) 25 MG tablet Take 50 mg by mouth at bedtime.    [provider]  lidocaine (LIDODERM) 5 % Place 1 patch onto the skin daily as needed (pain). Remove & Discard patch within 12 hours or as directed by MD 09/26/20   Viona Gilmore D, NP  metFORMIN (GLUCOPHAGE) 500 MG tablet Take 500 mg by mouth 2 (two) times daily.    [provider]  metoprolol succinate (TOPROL-XL) 50 MG 24 hr tablet Take 1 tablet (50 mg total) by mouth daily. 09/26/20   Viona Gilmore D, NP  metoprolol tartrate (LOPRESSOR) 25 MG tablet Take 25 mg by mouth daily.    [provider]  Multiple Vitamin (MULTIVITAMIN) tablet Take 1 tablet by mouth daily.      [provider]  Omega-3 Fatty Acids (FISH OIL) 1000 MG CAPS 2 caps by mouth twice daily Patient taking differently: Take 2,000 mg by mouth 2 (two) times daily. 04/16/11   Debbrah Alar, NP  omeprazole (PRILOSEC) 40 MG capsule Take 40 mg by mouth at bedtime.    [provider]  ondansetron (ZOFRAN-ODT) 4 MG disintegrating tablet Take 4 mg by mouth every 8 (eight) hours as needed for nausea or vomiting.    [provider]  oxyCODONE-acetaminophen (PERCOCET) 10-325 MG tablet Take 1-2 tablets by mouth every 8 (eight) hours as needed for pain.    [provider]  Semaglutide,0.25 or 0.'5MG'$ /DOS, (OZEMPIC, 0.25 OR 0.5 MG/DOSE,) 2 MG/1.5ML SOPN Inject 0.5 mg into the skin every Wednesday.     [provider]  tiZANidine (ZANAFLEX) 4 MG tablet Take 1 tablet (4 mg total) by mouth every 8 (eight) hours as needed for muscle spasms. 09/26/20 09/26/21  Viona Gilmore D, NP  traZODone (DESYREL) 50 MG tablet Take 100 mg  by mouth at bedtime.    [provider]  trimethoprim (TRIMPEX) 100 MG tablet Take 100 mg by mouth at bedtime.    [provider]  venlafaxine XR (EFFEXOR-XR) 75 MG 24 hr capsule TAKE TWO CAPSULES BY MOUTH ONCE DAILY Patient taking differently: Take 150 mg by mouth daily. 04/24/17   Debbrah Alar, NP  zolpidem (AMBIEN) 5 MG tablet Take 10 mg by mouth at bedtime.    [provider]      Allergies    Erythromycin, Losartan, and Prednisone    Review of Systems   Review of Systems  Constitutional:  Negative for chills and fever.  HENT:  Positive for congestion and sore throat. Negative for ear pain.    Eyes:  Negative for pain and visual disturbance.  Respiratory:  Positive for cough and chest tightness. Negative for shortness of breath.   Cardiovascular:  Negative for chest pain and palpitations.  Gastrointestinal:  Negative for abdominal pain and vomiting.  Genitourinary:  Negative for dysuria and hematuria.  Musculoskeletal:  Negative for arthralgias and back pain.  Skin:  Negative for color change and rash.  Neurological:  Positive for weakness. Negative for seizures and syncope.  All other systems reviewed and are negative.  Physical Exam Updated Vital Signs BP 139/84 (BP Location: Right Arm)    Pulse (!) 103    Temp 98.5 F (36.9 C) (Oral)    Resp 18    Ht 5' 3.5" (1.613 m)    Wt 68 kg    SpO2 93%    BMI 26.15 kg/m  Physical Exam Vitals and nursing note reviewed.  Constitutional:      General: She is not in acute distress.    Appearance: She is well-developed.  HENT:     Head: Normocephalic and atraumatic.  Eyes:     Conjunctiva/sclera: Conjunctivae normal.  Cardiovascular:     Rate and Rhythm: Normal rate and regular rhythm.     Heart sounds: No murmur heard. Pulmonary:     Effort: Respiratory distress present.     Comments: Hypoxic on arrival  Crackles right lower lung fields.  Abdominal:     Palpations: Abdomen is soft.     Tenderness: There is no abdominal tenderness.  Musculoskeletal:        General: No swelling.     Cervical back: Neck supple.  Skin:    General: Skin is warm and dry.     Capillary Refill: Capillary refill takes less than 2 seconds.  Neurological:     Mental Status: She is alert.  Psychiatric:        Mood and Affect: Mood normal.    ED Results / Procedures / Treatments   Labs (all labs ordered are listed, but only abnormal results are displayed) Labs Reviewed  RESP PANEL BY RT-PCR (FLU A&B, COVID) ARPGX2  CBC WITH DIFFERENTIAL/PLATELET  BASIC METABOLIC PANEL  TROPONIN I (HIGH SENSITIVITY)    EKG None  Radiology No results  found.  Procedures .Critical Care Performed by: Lianne Cure, DO Authorized by: Lianne Cure, DO   Critical care provider statement:    Critical care time (minutes):  36   Critical care was necessary to treat or prevent imminent or life-threatening deterioration of the following conditions:  Respiratory failure   Critical care was time spent personally by me on the following activities:  Development of treatment plan with patient or surrogate, discussions with consultants, evaluation of patient's response to treatment, examination of patient, ordering and  review of laboratory studies, ordering and review of radiographic studies, ordering and performing treatments and interventions, pulse oximetry, re-evaluation of patient's condition and review of old charts    Medications Ordered in ED Medications - No data to display  ED Course/ Medical Decision Making/ A&P                           Medical Decision Making Amount and/or Complexity of Data Reviewed External Data Reviewed: labs, radiology, ECG and notes. Labs: ordered. Decision-making details documented in ED Course. Radiology: ordered. Decision-making details documented in ED Course. ECG/medicine tests: ordered and independent interpretation performed. Decision-making details documented in ED Course.  Risk Decision regarding hospitalization.   7:36 AM 76 yo female presenting to ED for generalized weakness, sore throat, voice changes, chest heaviness, and cough. Pt is Aox3, no acute distress, afebrile, hypoxic on arrival at 87% on room air. Improved to 91-92% while resting on 2 L [place]. Crackles on right lower lung field.   I independently interpreted patient's labs and EKG. EKG stable with no ST segment elevation or depression. Troponin elevated likely due to ischemic demand secondary to hypoxia.   Covid positive. CXR demonstrates no acute process. Paxlovid started. Maintenance fluids started. Allergy to steroids-"makes me  crazy". Will withhold dexamethasone at this time. Will defer to primary team.   Pt recommended for admission for respiratory failure with hypoxia from covid 19 virus with elevated troponin's. Patient accepted to [facility] by admitting physician Dr. Olevia Bowens.         Final Clinical Impression(s) / ED Diagnoses Final diagnoses:  COVID  Acute respiratory failure with hypoxia ([place])  Hyperglycemia  Elevated troponin    Rx / DC Orders ED Discharge Orders     None         Lianne Cure, DO 63/87/56 [id number]

## 2021-04-20 NOTE — ED Notes (Signed)
Pt in bed, pt c/o some r ear pain with [place], pt has small skin tear on the inside of her R ear, states that she may have hit it when she fell.  ?

## 2021-04-20 NOTE — H&P (Addendum)
History and Physical    Patient: Kimberly Lin YIR:[id number] DOB: 30-Nov-1945 DOA: 04/20/2021 DOS: the patient was seen and examined on 04/20/2021 PCP: Sherald Hess., MD  Patient coming from: Home  Chief Complaint:  Chief Complaint  Patient presents with   Weakness   HPI: Kimberly Lin is a 76 y.o. female with medical history significant of abdominal mass, abnormal nuclear test, history of acute encephalopathy, osteoarthritis, colitis, type II DM, fibromyalgia, GERD, hiatal hernia, varicella-zoster, septic shock secondary to UTI, hyperlipidemia, hypertension, colon polyps, spinal stenosis who presented to [facility] with complaints of headache, rhinorrhea, sore throat, fatigue, chest heaviness, lightheadedness and dyspnea since Thursday.  She was prescribed Augmentin, which she has taken for the last 2 days.  She had a 101 F fever at home.  She has been nauseous and threw up a small amount yesterday.  She was going to the bathroom this morning, fell down, then was unable to get up and stayed on the floor for about 5 hours.  She was subsequently brought to the emergency department.  Denied chest pressure or dyspnea at the time of my examination.  Denied diarrhea, constipation, melena or hematochezia.  She has stress incontinence, but no flank pain, dysuria or hematuria.  No polyuria, polydipsia, polyphagia or blurred vision.  ED course: Initial vital signs were temperature 98.5 F, pulse 103, respiration 18, BP 139/84 mmHg and O2 sat 93% on nasal cannula oxygen at 2 LPM.  The patient was started on maintenance IVF and Paxlovid in the emergency department.  I time-limited the IVF and added KCl supplementation.  Lab work: Coronavirus PCR was positive.  CBC is her white count 11.0, hemoglobin 14.6 g/dL platelets 175.  D-dimer was 1.05.  Potassium was 3.1 and CO2 21 mmol/L.  Renal function was normal.  Glucose 215 and calcium 8.2 mg/dL.  Sodium corrects to glucose.  Troponin was 53  then 62 ng/L.  Imaging: Portable 1 view chest radiograph with no acute findings.  CT head without contrast with no evidence of acute intracranial abnormality.  There was atrophy and chronic small vessel white matter ischemic changes.  CT cervical spine with no static evidence of acute injury to the cervical spine.  Please see images and full radiology report of for further details.  Review of Systems: As mentioned in the history of present illness. All other systems reviewed and are negative. Past Medical History:  Diagnosis Date   Abdominal mass    Abnormal nuclear stress test 07/05/2014   Acute encephalopathy 03/26/2016   Arthritis    osteoarthritis   Colitis 2014   Diabetes mellitus    Fibromyalgia    GERD (gastroesophageal reflux disease)    Hiatal hernia    History of chicken pox    History of septic shock 12/2009   Hyperlipidemia    Hypertension    Personal history of colonic polyps    Sepsis secondary to UTI ([place]) 03/26/2016   Spinal stenosis    Past Surgical History:  Procedure Laterality Date   APPENDECTOMY  1983   BREAST BIOPSY Right 2007, 2009   CARDIAC CATHETERIZATION N/A 07/05/2014   Procedure: Left Heart Cath and Coronary Angiography;  Surgeon: Sherren Mocha, MD;  Location: [facility];  Service: Cardiovascular;  Laterality: N/A;   COLONOSCOPY  2014   normal    ESOPHAGOGASTRODUODENOSCOPY     in [place]   LUMBAR LAMINECTOMY/DECOMPRESSION MICRODISCECTOMY Left 09/25/2020   Procedure: Laminectomy for facet/synovial cyst - left - Lumbar  four-Lumbar five;  Surgeon: Earnie Larsson, MD;  Location: [facility];  Service: Neurosurgery;  Laterality: Left;   MEDIAL PARTIAL KNEE REPLACEMENT Bilateral 2006   OVARIAN CYST REMOVAL  2008   Pt states she had a ?cyst removed ?ovaries   POLYPECTOMY  1998   XI ROBOT ASSISTED RECTOPEXY N/A 12/30/2019   Procedure: XI ROBOT ASSISTED RECTOPEXY;  Surgeon: Leighton Ruff, MD;  Location: [facility];  Service: General;  Laterality: N/A;    Social History:  reports that she has quit smoking. Her smoking use included cigarettes. She has never used smokeless tobacco. She reports that she does not currently use alcohol. She reports that she does not use drugs.  Allergies  Allergen Reactions   Erythromycin Swelling    "all mycins"   Losartan     hyperkalemia   Prednisone Other (See Comments)    "Insomnia" and "makes me crazy"    Family History  Problem Relation Age of Onset   Arthritis Mother    Hyperlipidemia Mother    Hypertension Mother    Diabetes Mother    Heart disease Mother    Breast cancer Mother    Lung cancer Brother    Heart disease Brother        x 2   Hypertension Brother    Hyperlipidemia Brother    Diabetes Brother    Sudden death Father    Lung cancer Father    Rectal cancer Father    Diabetes Sister    Hyperlipidemia Sister    Hypertension Sister    Diabetes Maternal Aunt    Heart attack Maternal Aunt    Hyperlipidemia Maternal Aunt    Hypertension Maternal Aunt    Hypertension Maternal Uncle    Hyperlipidemia Maternal Uncle    Heart attack Maternal Uncle    Diabetes Maternal Uncle    Diabetes Paternal Aunt    Heart attack Paternal Aunt    Hyperlipidemia Paternal Aunt    Hypertension Paternal Aunt    Hypertension Paternal Uncle    Hyperlipidemia Paternal Uncle    Heart attack Paternal Uncle    Diabetes Paternal Uncle    Prostate cancer Brother    Stomach cancer Neg Hx    Esophageal cancer Neg Hx     Prior to Admission medications   Medication Sig Start Date End Date Taking? Authorizing Provider  acetaminophen (TYLENOL) 500 MG tablet Take 1,000 mg by mouth every 6 (six) hours as needed for moderate pain.    [provider]  amLODipine (NORVASC) 10 MG tablet Take 1 tablet (10 mg total) by mouth daily. 04/24/17   Debbrah Alar, NP  aspirin 81 MG tablet Take 81 mg by mouth at bedtime.    [provider]  atorvastatin (LIPITOR) 40 MG tablet Take 1 tablet (40  mg total) by mouth at bedtime. 09/26/20   Viona Gilmore D, NP  balsalazide (COLAZAL) 750 MG capsule Take 2,250 mg by mouth in the morning and at bedtime.    [provider]  budesonide (ENTOCORT EC) 3 MG 24 hr capsule Take 6 mg by mouth daily.    [provider]  diphenhydrAMINE (BENADRYL) 25 MG tablet Take 50 mg by mouth at bedtime.    [provider]  lidocaine (LIDODERM) 5 % Place 1 patch onto the skin daily as needed (pain). Remove & Discard patch within 12 hours or as directed by MD 09/26/20   Viona Gilmore D, NP  metFORMIN (GLUCOPHAGE) 500 MG tablet Take 500 mg by mouth 2 (  two) times daily.    [provider]  metoprolol succinate (TOPROL-XL) 50 MG 24 hr tablet Take 1 tablet (50 mg total) by mouth daily. 09/26/20   Viona Gilmore D, NP  metoprolol tartrate (LOPRESSOR) 25 MG tablet Take 25 mg by mouth daily.    [provider]  Multiple Vitamin (MULTIVITAMIN) tablet Take 1 tablet by mouth daily.      [provider]  Omega-3 Fatty Acids (FISH OIL) 1000 MG CAPS 2 caps by mouth twice daily Patient taking differently: Take 2,000 mg by mouth 2 (two) times daily. 04/16/11   Debbrah Alar, NP  omeprazole (PRILOSEC) 40 MG capsule Take 40 mg by mouth at bedtime.    [provider]  ondansetron (ZOFRAN-ODT) 4 MG disintegrating tablet Take 4 mg by mouth every 8 (eight) hours as needed for nausea or vomiting.    [provider]  oxyCODONE-acetaminophen (PERCOCET) 10-325 MG tablet Take 1-2 tablets by mouth every 8 (eight) hours as needed for pain.    [provider]  Semaglutide,0.25 or 0.'5MG'$ /DOS, (OZEMPIC, 0.25 OR 0.5 MG/DOSE,) 2 MG/1.5ML SOPN Inject 0.5 mg into the skin every Wednesday.     [provider]  tiZANidine (ZANAFLEX) 4 MG tablet Take 1 tablet (4 mg total) by mouth every 8 (eight) hours as needed for muscle spasms. 09/26/20 09/26/21  Viona Gilmore D, NP  traZODone (DESYREL) 50 MG tablet Take 100 mg  by mouth at bedtime.    [provider]  trimethoprim (TRIMPEX) 100 MG tablet Take 100 mg by mouth at bedtime.    [provider]  venlafaxine XR (EFFEXOR-XR) 75 MG 24 hr capsule TAKE TWO CAPSULES BY MOUTH ONCE DAILY Patient taking differently: Take 150 mg by mouth daily. 04/24/17   Debbrah Alar, NP  zolpidem (AMBIEN) 5 MG tablet Take 10 mg by mouth at bedtime.    [provider]    Physical Exam: Vitals:   04/20/21 0815 04/20/21 0900 04/20/21 0915 04/20/21 1011  BP: 129/77   125/84  Pulse: 87  88 86  Resp: '17  18 20  '$ Temp:    97.9 F (36.6 C)  TempSrc:    Oral  SpO2: 91% 92% 91% 93%  Weight:      Height:       Physical Exam Vitals and nursing note reviewed.  Constitutional:      Appearance: Normal appearance.     Interventions: Nasal cannula in place.  HENT:     Head: Normocephalic.     Nose:     Comments: Nasal cannula in place.    Mouth/Throat:     Mouth: Mucous membranes are moist.  Eyes:     Pupils: Pupils are equal, round, and reactive to light.  Neck:     Vascular: No JVD.  Cardiovascular:     Rate and Rhythm: Normal rate and regular rhythm.     Heart sounds: S1 normal and S2 normal.  Pulmonary:     Effort: Pulmonary effort is normal. No accessory muscle usage.     Breath sounds: No stridor. Examination of the right-lower field reveals rales. Rales present. No wheezing or rhonchi.  Abdominal:     General: There is no distension.     Palpations: Abdomen is soft.     Tenderness: There is no abdominal tenderness.  Musculoskeletal:     Cervical back: Neck supple.     Right lower leg: No edema.     Left lower leg: No edema.  Skin:  General: Skin is warm and dry.     Coloration: Skin is not jaundiced.     Comments: Minor abrasions of the knees. Positive hyperpigmentation and mild hyperkeratosis on the lower pretibial area.  Neurological:     General: No focal deficit present.     Mental Status: She is alert and oriented to  person, place, and time.    Data Reviewed:  There are no new results to review at this time.  EKG: Vent. rate 105 BPM PR interval 152 ms QRS duration 81 ms QT/QTcB 355/470 ms P-R-T axes 78 32 5 Sinus tachycardia Borderline T abnormalities, anterior leads  Assessment and Plan: Principal Problem:   Acute hypoxemic respiratory failure due to COVID-19 [facility]) Observation/PCU. Continue supplemental oxygen. Continue DuoNebs as needed. Continue droplet precautions. Continue supplemental oxygen. Flutter valve and incentive spirometry. Continue Paxlovid twice daily. Unable to tolerate systemic glucocorticoids. Pulmicort 0.5 mg via neb twice daily. Follow-up CBC, CMP and inflammatory markers.  Active Problems:   Elevated troponin Likely demand ischemia. Resume aspirin. Check echocardiogram.    GERD (gastroesophageal reflux disease) Continue PPI.    Hyperlipidemia Statin held while taking Paxlovid.    Hypertension Amlodipine lowered to 5 mg p.o. daily while in Paxlovid. Continue metoprolol tartrate 25 mg p.o. daily. Continue metoprolol succinate 50 mg p.o. daily    Type 2 diabetes mellitus with hyperglycemia (HCC) Continue metformin 500 mg p.o. twice daily. CBG monitoring with RI SS. Continue weekly Ozempic.    OSA (obstructive sleep apnea) No longer on CPAP.    Anxiety and depression Continue antidepressants after med rec done.    Hypocalcemia Check albumin level. Recheck calcium level in AM.      Advance Care Planning:   Code Status: Full code.  Consults:   Family Communication:   Severity of Illness: The appropriate patient status for this patient is OBSERVATION. Observation status is judged to be reasonable and necessary in order to provide the required intensity of service to ensure the patient's safety. The patient's presenting symptoms, physical exam findings, and initial radiographic and laboratory data in the context of their medical condition is  felt to place them at decreased risk for further clinical deterioration. Furthermore, it is anticipated that the patient will be medically stable for discharge from the hospital within 2 midnights of admission.   Author: Reubin Milan, MD 04/20/2021 11:03 AM  For on call review [email or web address].   This document was prepared using [employer] and may contain some unintended transcription errors.

## 2021-04-20 NOTE — ED Notes (Signed)
Pt in bed with eyes closed, pt arouses easily to verbal stim, pt reports back pain, states that she always has back pain.  Updated pt on plan of care.  ?

## 2021-04-20 NOTE — Plan of Care (Signed)
?  Problem: Pain Managment: ?Goal: General experience of comfort will improve ?Outcome: Progressing ?  ?Problem: Education: ?Goal: Knowledge of risk factors and measures for prevention of condition will improve ?Outcome: Progressing ?  ?Problem: Coping: ?Goal: Psychosocial and spiritual needs will be supported ?Outcome: Progressing ?  ?Problem: Respiratory: ?Goal: Will maintain a patent airway ?Outcome: Progressing ?  ?

## 2021-04-20 NOTE — Progress Notes (Signed)
Nursing staff, Please call [facility] number on Amion as soon as patient's arrival, so appropriate admitting provider can evaluate the pt. ? ?Plan of Care Note for accepted transfer ? ? ?Patient: Kimberly Lin MRN: [id number]   DOA: 04/20/2021 ? ?Facility requesting transfer: [facility].  ?Requesting Provider: Campbell Stall, DO. ?Reason for transfer: Hypoxia due to COVID-19 disease. ?Facility course:  ?76 year old female with a past medical history of fibromyalgia, anxiety, depression, GERD, hyperlipidemia, NSTEMI, colon polyps, migraine headaches, uncontrolled type II DM, PVCs, OSA, overweight with a BMI of 26.15 kg/m?, BPPV, fatty liver disease, history of falls who came into the emergency department with a history of fever, malaise, generalized weakness, congestion, sore throat, nonproductive cough, lightheadedness resulting in a fall in the bathroom earlier in the morning.  She was found to be hypoxic at 87% on arrival to the emergency department.  Her current O2 oxygen requirement is 2 LPM via [place].  She was started on Paxlovid.  She declined glucocorticoids because she stated that "it makes me crazy".  She has been accepted to PCU unit at [facility] and glycemic control order sets started.  Please place admission (multi morbid) adult order set after she arrives to the hospital. ? ?Plan of care: ?The patient is accepted for admission to [facility] unit, at [facility]. ? ?Author: ?Reubin Milan, MD ?04/20/2021 ? ?Check [email or web address] for on-call coverage. ? ?Nursing staff, Please call [place] number on Amion as soon as patient's arrival, so appropriate admitting provider can evaluate the pt. ?

## 2021-04-20 NOTE — Progress Notes (Signed)
PT demonstrated hands on understanding of Flutter device. NPC at this time. CXR clear. ?

## 2021-04-20 NOTE — Progress Notes (Addendum)
[facility] admitting physician addendum: ? ?The nursing staff reported that the patient is having hip pain.  She had a fall this morning and was unable to stand up for about 5 hours.  Stat Right hip and right knee x-ray have been ordered. Her total CK came back 2498 units/L.  I will start LR infusion after NS plus KCl 40 mEq has being infused.  Rhabdomyolysis has been added to the inpatient problem list.  Earlier, her procalcitonin level was 0.98 ng/mL.  CAP coverage with ceftriaxone and doxycycline started. ? ?Tennis Must, MD. ?

## 2021-04-20 NOTE — ED Triage Notes (Signed)
Pt satting 87- 93 % on room air, pt placed on 2L via [place], pt now satting 90-95% ?

## 2021-04-21 ENCOUNTER — Inpatient Hospital Stay (HOSPITAL_COMMUNITY): Payer: Medicare Other

## 2021-04-21 DIAGNOSIS — Z8249 Family history of ischemic heart disease and other diseases of the circulatory system: Secondary | ICD-10-CM | POA: Diagnosis not present

## 2021-04-21 DIAGNOSIS — Z8042 Family history of malignant neoplasm of prostate: Secondary | ICD-10-CM | POA: Diagnosis not present

## 2021-04-21 DIAGNOSIS — W01198A Fall on same level from slipping, tripping and stumbling with subsequent striking against other object, initial encounter: Secondary | ICD-10-CM | POA: Diagnosis present

## 2021-04-21 DIAGNOSIS — I248 Other forms of acute ischemic heart disease: Secondary | ICD-10-CM | POA: Diagnosis present

## 2021-04-21 DIAGNOSIS — Z87891 Personal history of nicotine dependence: Secondary | ICD-10-CM | POA: Diagnosis not present

## 2021-04-21 DIAGNOSIS — M797 Fibromyalgia: Secondary | ICD-10-CM | POA: Diagnosis present

## 2021-04-21 DIAGNOSIS — Z8261 Family history of arthritis: Secondary | ICD-10-CM | POA: Diagnosis not present

## 2021-04-21 DIAGNOSIS — K219 Gastro-esophageal reflux disease without esophagitis: Secondary | ICD-10-CM | POA: Diagnosis present

## 2021-04-21 DIAGNOSIS — G4733 Obstructive sleep apnea (adult) (pediatric): Secondary | ICD-10-CM | POA: Diagnosis present

## 2021-04-21 DIAGNOSIS — J9601 Acute respiratory failure with hypoxia: Secondary | ICD-10-CM | POA: Diagnosis present

## 2021-04-21 DIAGNOSIS — I340 Nonrheumatic mitral (valve) insufficiency: Secondary | ICD-10-CM

## 2021-04-21 DIAGNOSIS — F32A Depression, unspecified: Secondary | ICD-10-CM | POA: Diagnosis present

## 2021-04-21 DIAGNOSIS — R778 Other specified abnormalities of plasma proteins: Secondary | ICD-10-CM | POA: Diagnosis not present

## 2021-04-21 DIAGNOSIS — Z833 Family history of diabetes mellitus: Secondary | ICD-10-CM | POA: Diagnosis not present

## 2021-04-21 DIAGNOSIS — Z803 Family history of malignant neoplasm of breast: Secondary | ICD-10-CM | POA: Diagnosis not present

## 2021-04-21 DIAGNOSIS — N393 Stress incontinence (female) (male): Secondary | ICD-10-CM | POA: Diagnosis present

## 2021-04-21 DIAGNOSIS — E1165 Type 2 diabetes mellitus with hyperglycemia: Secondary | ICD-10-CM | POA: Diagnosis present

## 2021-04-21 DIAGNOSIS — I10 Essential (primary) hypertension: Secondary | ICD-10-CM | POA: Diagnosis present

## 2021-04-21 DIAGNOSIS — M6282 Rhabdomyolysis: Secondary | ICD-10-CM | POA: Diagnosis present

## 2021-04-21 DIAGNOSIS — U071 COVID-19: Secondary | ICD-10-CM | POA: Diagnosis present

## 2021-04-21 DIAGNOSIS — F419 Anxiety disorder, unspecified: Secondary | ICD-10-CM | POA: Diagnosis present

## 2021-04-21 DIAGNOSIS — J9600 Acute respiratory failure, unspecified whether with hypoxia or hypercapnia: Secondary | ICD-10-CM | POA: Diagnosis present

## 2021-04-21 DIAGNOSIS — Z800 Family history of malignant neoplasm of digestive organs: Secondary | ICD-10-CM | POA: Diagnosis not present

## 2021-04-21 DIAGNOSIS — I2510 Atherosclerotic heart disease of native coronary artery without angina pectoris: Secondary | ICD-10-CM | POA: Diagnosis not present

## 2021-04-21 DIAGNOSIS — Z83438 Family history of other disorder of lipoprotein metabolism and other lipidemia: Secondary | ICD-10-CM | POA: Diagnosis not present

## 2021-04-21 DIAGNOSIS — Z881 Allergy status to other antibiotic agents status: Secondary | ICD-10-CM | POA: Diagnosis not present

## 2021-04-21 DIAGNOSIS — Z801 Family history of malignant neoplasm of trachea, bronchus and lung: Secondary | ICD-10-CM | POA: Diagnosis not present

## 2021-04-21 DIAGNOSIS — R739 Hyperglycemia, unspecified: Secondary | ICD-10-CM | POA: Diagnosis not present

## 2021-04-21 DIAGNOSIS — E785 Hyperlipidemia, unspecified: Secondary | ICD-10-CM | POA: Diagnosis present

## 2021-04-21 LAB — CBC WITH DIFFERENTIAL/PLATELET
Abs Immature Granulocytes: 0.01 10*3/uL (ref 0.00–0.07)
Basophils Absolute: 0 10*3/uL (ref 0.0–0.1)
Basophils Relative: 0 %
Eosinophils Absolute: 0 10*3/uL (ref 0.0–0.5)
Eosinophils Relative: 0 %
HCT: 39.1 % (ref 36.0–46.0)
Hemoglobin: 13.2 g/dL (ref 12.0–15.0)
Immature Granulocytes: 0 %
Lymphocytes Relative: 13 %
Lymphs Abs: 0.7 10*3/uL (ref 0.7–4.0)
MCH: 30.7 pg (ref 26.0–34.0)
MCHC: 33.8 g/dL (ref 30.0–36.0)
MCV: 90.9 fL (ref 80.0–100.0)
Monocytes Absolute: 0.4 10*3/uL (ref 0.1–1.0)
Monocytes Relative: 7 %
Neutro Abs: 4.4 10*3/uL (ref 1.7–7.7)
Neutrophils Relative %: 80 %
Platelets: 142 10*3/uL — ABNORMAL LOW (ref 150–400)
RBC: 4.3 MIL/uL (ref 3.87–5.11)
RDW: 13.2 % (ref 11.5–15.5)
WBC: 5.6 10*3/uL (ref 4.0–10.5)
nRBC: 0 % (ref 0.0–0.2)

## 2021-04-21 LAB — COMPREHENSIVE METABOLIC PANEL
ALT: 22 U/L (ref 0–44)
AST: 63 U/L — ABNORMAL HIGH (ref 15–41)
Albumin: 3 g/dL — ABNORMAL LOW (ref 3.5–5.0)
Alkaline Phosphatase: 41 U/L (ref 38–126)
Anion gap: 7 (ref 5–15)
BUN: 22 mg/dL (ref 8–23)
CO2: 22 mmol/L (ref 22–32)
Calcium: 8.2 mg/dL — ABNORMAL LOW (ref 8.9–10.3)
Chloride: 105 mmol/L (ref 98–111)
Creatinine, Ser: 0.64 mg/dL (ref 0.44–1.00)
GFR, Estimated: >60 mL/min (ref >60)
Glucose, Bld: 203 mg/dL — ABNORMAL HIGH (ref 70–99)
Potassium: 4.3 mmol/L (ref 3.5–5.1)
Sodium: 134 mmol/L — ABNORMAL LOW (ref 135–145)
Total Bilirubin: 0.3 mg/dL (ref 0.3–1.2)
Total Protein: 6.1 g/dL — ABNORMAL LOW (ref 6.5–8.1)

## 2021-04-21 LAB — D-DIMER, QUANTITATIVE: D-Dimer, Quant: 0.65 ug/mL-FEU — ABNORMAL HIGH (ref 0.00–0.50)

## 2021-04-21 LAB — ECHOCARDIOGRAM COMPLETE
Area-P 1/2: 2.36 cm2
Height: 63.5 in
S' Lateral: 2.45 cm
Weight: 2400 oz

## 2021-04-21 LAB — FERRITIN: Ferritin: 209 ng/mL (ref 11–307)

## 2021-04-21 LAB — GLUCOSE, CAPILLARY
Glucose-Capillary: 161 mg/dL — ABNORMAL HIGH (ref 70–99)
Glucose-Capillary: 172 mg/dL — ABNORMAL HIGH (ref 70–99)
Glucose-Capillary: 160 mg/dL — ABNORMAL HIGH (ref 70–99)
Glucose-Capillary: 179 mg/dL — ABNORMAL HIGH (ref 70–99)

## 2021-04-21 LAB — CK: Total CK: 1954 U/L — ABNORMAL HIGH (ref 38–234)

## 2021-04-21 LAB — C-REACTIVE PROTEIN: CRP: 11.2 mg/dL — ABNORMAL HIGH (ref <1.0)

## 2021-04-21 LAB — MAGNESIUM: Magnesium: 1.7 mg/dL (ref 1.7–2.4)

## 2021-04-21 LAB — STREP PNEUMONIAE URINARY ANTIGEN: Strep Pneumo Urinary Antigen: NEGATIVE

## 2021-04-21 LAB — PHOSPHORUS: Phosphorus: 3.2 mg/dL (ref 2.5–4.6)

## 2021-04-21 MED ORDER — MAGNESIUM OXIDE -MG SUPPLEMENT 400 (240 MG) MG PO TABS
400.0000 mg | ORAL_TABLET | Freq: Two times a day (BID) | ORAL | Status: AC
  Administered 2021-04-21 (×2): 400 mg via ORAL
  Filled 2021-04-21 (×2): qty 1

## 2021-04-21 MED ORDER — INSULIN ASPART 100 UNIT/ML IJ SOLN
4.0000 [IU] | Freq: Three times a day (TID) | INTRAMUSCULAR | Status: DC
  Administered 2021-04-21 – 2021-04-23 (×7): 4 [IU] via SUBCUTANEOUS

## 2021-04-21 MED ORDER — INSULIN ASPART 100 UNIT/ML IJ SOLN
0.0000 [IU] | Freq: Every day | INTRAMUSCULAR | Status: DC
  Administered 2021-04-22: 3 [IU] via SUBCUTANEOUS

## 2021-04-21 MED ORDER — IPRATROPIUM-ALBUTEROL 0.5-2.5 (3) MG/3ML IN SOLN
3.0000 mL | Freq: Two times a day (BID) | RESPIRATORY_TRACT | Status: DC
  Administered 2021-04-22 (×2): 3 mL via RESPIRATORY_TRACT
  Filled 2021-04-21 (×3): qty 3

## 2021-04-21 MED ORDER — SODIUM CHLORIDE 0.9 % IV BOLUS
1000.0000 mL | Freq: Once | INTRAVENOUS | Status: AC
  Administered 2021-04-21: 1000 mL via INTRAVENOUS

## 2021-04-21 MED ORDER — LACTATED RINGERS IV SOLN: INTRAVENOUS | Status: DC

## 2021-04-21 MED ORDER — INSULIN ASPART 100 UNIT/ML IJ SOLN
0.0000 [IU] | Freq: Three times a day (TID) | INTRAMUSCULAR | Status: DC
  Administered 2021-04-21 – 2021-04-22 (×5): 3 [IU] via SUBCUTANEOUS
  Administered 2021-04-22: 5 [IU] via SUBCUTANEOUS
  Administered 2021-04-23: 3 [IU] via SUBCUTANEOUS

## 2021-04-21 MED ORDER — OXYCODONE HCL 5 MG PO TABS
10.0000 mg | ORAL_TABLET | Freq: Four times a day (QID) | ORAL | Status: DC | PRN
  Administered 2021-04-21 – 2021-04-23 (×4): 10 mg via ORAL
  Filled 2021-04-21 (×4): qty 2

## 2021-04-21 NOTE — Progress Notes (Signed)
SATURATION QUALIFICATIONS: (This note is used to comply with regulatory documentation for home oxygen) ? ?Patient Saturations on Room Air at Rest = 98% ? ?Patient Saturations on Room Air while Ambulating = 93% ? ?Patient Saturations on  Liters of oxygen while Ambulating = % ? ?Please briefly explain why patient needs home oxygen: ? ?No O2 needed, pt able to maintain saturations while ambulating on RA ?

## 2021-04-21 NOTE — Progress Notes (Signed)
[facility] PROGRESS NOTE    Progress Note  Kimberly Lin  CXK:[id number] DOB: 1945/12/03 DOA: 04/20/2021 PCP: Sherald Hess., MD     Brief Narrative:   Kimberly Lin is an 76 y.o. female past medical history significant for an abdominal mass, diabetes mellitus type 2 history of septic shock secondary to UTI spinal stenosis who presented with [place] with complaint of headache rhinorrhea sore throat fatigue dyspnea on exertion this started 3 days prior to admission she went to see her primary care doctor who prescribed her Augmentin which she has been taking for 2 days prior to admission she started having a fever with nausea and vomiting came into the ED was found to be hypoxic, was found to be COVID-19 positive with a white count of 11 potassium of 3.1 blood glucose 200 cardiac biomarkers flat    Assessment/Plan:   Acute hypoxemic respiratory failure due to COVID-19 [facility]): She was started on Paxlovid twice a day, systemic steroids not started as she was unable to tolerate it.  She has been weaned to room air. She was continued on Pulmicort twice a day. Procalcitonin is low yield discontinue Rocephin and doxycycline. Tmax of 98.2, her leukocytosis is resolved. Imaging showed no infiltrate. Inflammatory markers was mildly elevated. Ambulating check saturations with ambulation. Anything above 88% saturation wise is good on her.  Nontraumatic rhabdomyolysis: CK still elevated at 2000 we will bolus her normal saline continue IV fluids and recheck CK in the morning.  Elevated troponins: Likely demand ischemia continue aspirin.    Elevated LFTs: Likely due to rhabdomyolysis.  Mechanical fall: She did hit her head CT scan of the head showed no acute findings,. X-ray of the hip showed no acute fractures. Knee x-ray showed no osseous abnormalities as it shows chondrocalcinosis of the lateral meniscus consistent with a CCP deposit CT of the C-spine showed  no acute fracture or dislocation.  GERD (gastroesophageal reflux disease): Continue PPI.  Essential hypertension: Continue amlodipine and metoprolol. Her blood pressure is well controlled.  Diabetes mellitus type 2: Continue metformin twice a day and Ozempic weekly. Blood glucose relatively elevated continue sliding scale insulin  Obstructive sleep apnea: No longer on CPAP.  Anxiety and depression: Continue medications at home regimen.  Hypocalcemia: Corrected for her albumin is within normal. is    DVT prophylaxis: lovenox Family Communication:husband Status is: Observation The patient will require care spanning > 2 midnights and should be moved to inpatient because: Acute respiratory failure with hypoxia due to COVID and rhabdomyolysis.    Code Status:     Code Status Orders  (From admission, onward)           Start     Ordered   04/20/21 1111  Full code  Continuous        04/20/21 1110           Code Status History     Date Active Date Inactive Code Status Order ID Comments User Context   09/25/2020 1403 09/26/2020 1714 Full Code [id number]  Earnie Larsson, MD Inpatient   12/30/2019 1606 01/02/2020 1716 Full Code [id number]  Leighton Ruff, MD Inpatient   03/26/2016 0342 03/28/2016 1752 Full Code [id number]  Norval Morton, MD Inpatient   03/26/2016 0342 03/26/2016 0342 Full Code [id number]  Roney Jaffe, MD Inpatient   07/05/2014 1123 07/05/2014 1659 Full Code [id number]  Sherren Mocha, MD Inpatient      Advance Directive Documentation    Flowsheet Row Most Recent  Value  Type of Advance Directive Healthcare Power of Attorney, Living will  Pre-existing out of facility DNR order (yellow form or pink MOST form) --  "MOST" Form in Place? --         IV Access:   Peripheral IV   Procedures and diagnostic studies:   DG Knee 1-2 Views Right  Result Date: 04/20/2021 CLINICAL DATA:  Fall with bruising. EXAM: RIGHT KNEE - 1-2 VIEW COMPARISON:  None.  FINDINGS: Right knee medial hemiarthroplasty. No hardware complication or periprosthetic fracture identified. No joint effusion. Enthesophytes at the quadriceps insertion and patellar tendon origin. Subtle chondrocalcinosis within the lateral meniscus. IMPRESSION: No acute osseous abnormality. 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 Electronically Signed   By: Abigail Miyamoto M.D.   On: 04/20/2021 17:48   CT Head Wo Contrast  Result Date: 04/20/2021 CLINICAL DATA:  76 year old female with acute head and neck pain following fall. Initial encounter. EXAM: CT HEAD WITHOUT CONTRAST CT CERVICAL SPINE WITHOUT CONTRAST TECHNIQUE: Multidetector CT imaging of the head and cervical spine was performed following the standard protocol without intravenous contrast. Multiplanar CT image reconstructions of the cervical spine were also generated. RADIATION DOSE REDUCTION: This exam was performed according to the departmental dose-optimization program which includes automated exposure control, adjustment of the mA and/or kV according to patient size and/or use of iterative reconstruction technique. COMPARISON:  04/25/2020 head CT and prior studies FINDINGS: CT HEAD FINDINGS Brain: No evidence of acute infarction, hemorrhage, hydrocephalus, extra-axial collection or mass lesion/mass effect. Atrophy and chronic small-vessel white matter ischemic changes again noted. Vascular: No hyperdense vessel or unexpected calcification. Skull: Normal. Negative for fracture or focal lesion. Sinuses/Orbits: No acute abnormality. Mild mucosal thickening in scattered paranasal sinuses noted. Other: None CT CERVICAL SPINE FINDINGS Alignment: Normal. Skull base and vertebrae: No acute fracture. No primary bone lesion or focal pathologic process. Soft tissues and spinal canal: No prevertebral fluid or swelling. No visible canal hematoma. Disc levels: Unchanged multilevel mild degenerative  disc disease/spondylosis and facet arthropathy noted. Mild central spinal and bony foraminal narrowing again identified at C5-6. Upper chest: No acute abnormality Other: None IMPRESSION: 1. No evidence of acute intracranial abnormality. Atrophy and chronic small-vessel white matter ischemic changes. 2. No static evidence of acute injury to the cervical spine. Electronically Signed   By: Margarette Canada M.D.   On: 04/20/2021 09:16   CT Cervical Spine Wo Contrast  Result Date: 04/20/2021 CLINICAL DATA:  76 year old female with acute head and neck pain following fall. Initial encounter. EXAM: CT HEAD WITHOUT CONTRAST CT CERVICAL SPINE WITHOUT CONTRAST TECHNIQUE: Multidetector CT imaging of the head and cervical spine was performed following the standard protocol without intravenous contrast. Multiplanar CT image reconstructions of the cervical spine were also generated. RADIATION DOSE REDUCTION: This exam was performed according to the departmental dose-optimization program which includes automated exposure control, adjustment of the mA and/or kV according to patient size and/or use of iterative reconstruction technique. COMPARISON:  04/25/2020 head CT and prior studies FINDINGS: CT HEAD FINDINGS Brain: No evidence of acute infarction, hemorrhage, hydrocephalus, extra-axial collection or mass lesion/mass effect. Atrophy and chronic small-vessel white matter ischemic changes again noted. Vascular: No hyperdense vessel or unexpected calcification. Skull: Normal. Negative for fracture or focal lesion. Sinuses/Orbits: No acute abnormality. Mild mucosal thickening in scattered paranasal sinuses noted. Other: None CT CERVICAL SPINE FINDINGS Alignment: Normal. Skull base and vertebrae: No acute fracture. No primary bone lesion or focal pathologic process. Soft tissues and spinal  canal: No prevertebral fluid or swelling. No visible canal hematoma. Disc levels: Unchanged multilevel mild degenerative disc disease/spondylosis and  facet arthropathy noted. Mild central spinal and bony foraminal narrowing again identified at C5-6. Upper chest: No acute abnormality Other: None IMPRESSION: 1. No evidence of acute intracranial abnormality. Atrophy and chronic small-vessel white matter ischemic changes. 2. No static evidence of acute injury to the cervical spine. Electronically Signed   By: Margarette Canada M.D.   On: 04/20/2021 09:16   DG Chest Portable 1 View  Result Date: 04/20/2021 CLINICAL DATA:  Cough and shortness of breath with generalized weakness EXAM: PORTABLE CHEST 1 VIEW COMPARISON:  04/02/2019 FINDINGS: Chronic elevation of the right diaphragm. Normal heart size and mediastinal contours. No acute infiltrate or edema. Vaguely increased density at the medial right base is chronic and likely from scarring based on 2021 chest CT. No effusion or pneumothorax. Interval but nonacute distal third right clavicle fracture with nonunion. No acute osseous findings. IMPRESSION: No acute finding when compared to prior. Electronically Signed   By: Jorje Guild M.D.   On: 04/20/2021 07:54   DG HIP UNILAT WITH PELVIS 2-3 VIEWS RIGHT  Result Date: 04/20/2021 CLINICAL DATA:  Hip pain EXAM: DG HIP (WITH OR WITHOUT PELVIS) 2-3V RIGHT COMPARISON:  None. FINDINGS: Hip joints and SI joints symmetric. No acute bony abnormality. Specifically, no fracture, subluxation, or dislocation. IMPRESSION: No acute bony abnormality. Electronically Signed   By: Rolm Baptise M.D.   On: 04/20/2021 17:49     Medical Consultants:   None.   Subjective:    Lamount Cranker she relates she still feels weak and tired  Objective:    Vitals:   04/20/21 2105 04/21/21 0101 04/21/21 0505 04/21/21 0751  BP:  115/60 115/80   Pulse:  72 66   Resp:  18 18   Temp:  (!) 97.5 F (36.4 C) (!) 97.4 F (36.3 C)   TempSrc:  Oral    SpO2: 94% 95% 94% (!) 89%  Weight:      Height:       SpO2: (!) 89 % (placed on 2 lpm post neb) O2 Flow Rate (L/min): 2  L/min   Intake/Output Summary (Last 24 hours) at 04/21/2021 0802 Last data filed at 04/21/2021 0600 Gross per 24 hour  Intake 1902.42 ml  Output --  Net 1902.42 ml   Filed Weights   04/20/21 0724  Weight: 68 kg    Exam: General exam: In no acute distress. Respiratory system: Good air movement and clear to auscultation. Cardiovascular system: S1 & S2 heard, RRR. No JVD.  Gastrointestinal system: Abdomen is nondistended, soft and nontender.  Extremities: No pedal edema. Skin: No rashes, lesions or ulcers Psychiatry: Judgement and insight appear normal. Mood & affect appropriate.    Data Reviewed:    Labs: Basic Metabolic Panel: Recent Labs  Lab 04/20/21 0744 04/21/21 0453  NA 133* 134*  K 3.1* 4.3  CL 100 105  CO2 21* 22  GLUCOSE 215* 203*  BUN 21 22  CREATININE 0.97 0.64  CALCIUM 8.2* 8.2*  MG  --  1.7  PHOS  --  3.2   GFR Estimated Creatinine Clearance: 57 mL/min (by C-G formula based on SCr of 0.64 mg/dL). Liver Function Tests: Recent Labs  Lab 04/20/21 1451 04/21/21 0453  AST 73* 63*  ALT 26 22  ALKPHOS 48 41  BILITOT 0.4 0.3  PROT 6.5 6.1*  ALBUMIN 3.3* 3.0*   No results for input(s): LIPASE, AMYLASE in  the last 168 hours. No results for input(s): AMMONIA in the last 168 hours. Coagulation profile No results for input(s): INR, PROTIME in the last 168 hours. COVID-19 Labs  Recent Labs    04/20/21 0744 04/20/21 1015 04/21/21 0453  DDIMER 1.05*  --  0.65*  FERRITIN  --  196 209  CRP  --  10.7*  --     Lab Results  Component Value Date   SARSCOV2NAA POSITIVE (A) 04/20/2021   SARSCOV2NAA NEGATIVE 09/25/2020   [place] NEGATIVE 12/27/2019    CBC: Recent Labs  Lab 04/20/21 0744 04/21/21 0453  WBC 11.0* 5.6  NEUTROABS 9.2* 4.4  HGB 14.6 13.2  HCT 42.6 39.1  MCV 87.8 90.9  PLT 175 142*   Cardiac Enzymes: Recent Labs  Lab 04/20/21 1451 04/21/21 0453  CKTOTAL 2,498* 1,954*   BNP (last 3 results) No results for input(s):  PROBNP in the last 8760 hours. CBG: Recent Labs  Lab 04/20/21 1216 04/20/21 1651 04/20/21 2034  GLUCAP 163* 148* 194*   D-Dimer: Recent Labs    04/20/21 0744 04/21/21 0453  DDIMER 1.05* 0.65*   Hgb A1c: No results for input(s): HGBA1C in the last 72 hours. Lipid Profile: No results for input(s): CHOL, HDL, LDLCALC, TRIG, CHOLHDL, LDLDIRECT in the last 72 hours. Thyroid function studies: No results for input(s): TSH, T4TOTAL, T3FREE, THYROIDAB in the last 72 hours.  Invalid input(s): FREET3 Anemia work up: Recent Labs    04/20/21 1015 04/21/21 0453  FERRITIN 196 209   Sepsis Labs: Recent Labs  Lab 04/20/21 0744 04/21/21 0453  PROCALCITON 0.98  --   WBC 11.0* 5.6   Microbiology Recent Results (from the past 240 hour(s))  Resp Panel by RT-PCR (Flu A&B, Covid) Nasopharyngeal Swab     Status: Abnormal   Collection Time: 04/20/21  7:44 AM   Specimen: Nasopharyngeal Swab; Nasopharyngeal(NP) swabs in vial transport medium  Result Value Ref Range Status   SARS Coronavirus 2 by RT PCR POSITIVE (A) NEGATIVE Final    Comment: (NOTE) SARS-CoV-2 target nucleic acids are DETECTED.  The SARS-CoV-2 RNA is generally detectable in upper respiratory specimens during the acute phase of infection. Positive results are indicative of the presence of the identified virus, but do not rule out bacterial infection or co-infection with other pathogens not detected by the test. Clinical correlation with patient history and other diagnostic information is necessary to determine patient infection status. The expected result is Negative.  Fact Sheet for Patients: EntrepreneurPulse.com.au  Fact Sheet for Healthcare Providers: IncredibleEmployment.be  This test is not yet approved or cleared by the [place] FDA and  has been authorized for detection and/or diagnosis of SARS-CoV-2 by FDA under an Emergency Use Authorization (EUA).  This EUA  will remain in effect (meaning this test can be used) for the duration of  the COVID-19 declaration under Section 564(b)(1) of the A ct, 21 U.S.C. section 360bbb-3(b)(1), unless the authorization is terminated or revoked sooner.     Influenza A by PCR NEGATIVE NEGATIVE Final   Influenza B by PCR NEGATIVE NEGATIVE Final    Comment: (NOTE) The Xpert Xpress SARS-CoV-2/FLU/RSV plus assay is intended as an aid in the diagnosis of influenza from Nasopharyngeal swab specimens and should not be used as a sole basis for treatment. Nasal washings and aspirates are unacceptable for Xpert Xpress SARS-CoV-2/FLU/RSV testing.  Fact Sheet for Patients: EntrepreneurPulse.com.au  Fact Sheet for Healthcare Providers: IncredibleEmployment.be  This test is not yet approved or cleared by the [place] FDA  and has been authorized for detection and/or diagnosis of SARS-CoV-2 by FDA under an Emergency Use Authorization (EUA). This EUA will remain in effect (meaning this test can be used) for the duration of the COVID-19 declaration under Section 564(b)(1) of the Act, 21 U.S.C. section 360bbb-3(b)(1), unless the authorization is terminated or revoked.  Performed at [facility], [address] [place], [place] [address]      Medications:    amLODipine  5 mg Oral Daily   vitamin C  500 mg Oral Daily   aspirin EC  81 mg Oral QHS   balsalazide  2,250 mg Oral BID   budesonide  6 mg Oral Daily   budesonide (PULMICORT) nebulizer solution  0.5 mg Nebulization BID   diazepam  10 mg Oral Daily   diphenhydrAMINE  50 mg Oral QHS   enoxaparin (LOVENOX) injection  40 mg Subcutaneous Q24H   insulin aspart  0-15 Units Subcutaneous TID WC   ipratropium-albuterol  3 mL Nebulization TID   metFORMIN  500 mg Oral BID WC   metoprolol succinate  50 mg Oral Daily   metoprolol tartrate  25 mg Oral Daily   nirmatrelvir/ritonavir EUA  3 tablet Oral BID    pantoprazole  40 mg Oral QHS   traZODone  100 mg Oral QHS   venlafaxine XR  150 mg Oral q morning   zinc sulfate  220 mg Oral Daily   Continuous Infusions:  cefTRIAXone (ROCEPHIN)  IV 1 g (04/20/21 1547)   doxycycline (VIBRAMYCIN) IV 100 mg (04/21/21 0325)   lactated ringers 100 mL/hr at 04/20/21 2010      LOS: 0 days   Charlynne Cousins  [facility]  04/21/2021, 8:02 AM

## 2021-04-21 NOTE — TOC Initial Note (Signed)
Transition of Care (TOC) - Initial/Assessment Note  ? ? ?Patient Details  ?Name: Kimberly Lin ?MRN: [id number] ?Date of Birth: September 29, 1945 ? ?Transition of Care (TOC) CM/SW Contact:    ?Tawanna Cooler, RN ?Phone Number: ?04/21/2021, 3:25 PM ? ?Clinical Narrative:                 ? ?Covid+.  From home.  TOC following for any home O2 needs.   ? ? ?Expected Discharge Plan: Home/Self Care ?Barriers to Discharge: Continued Medical Work up ? ? ? ?Expected Discharge Plan and Services ?Expected Discharge Plan: Home/Self Care ?  ?  ?  ?Living arrangements for the past 2 months: [place] ?                ?  ?   ? ?Prior Living Arrangements/Services ?Living arrangements for the past 2 months: [place] ?Lives with:: Spouse ?Patient language and need for interpreter reviewed:: Yes ?Do you feel safe going back to the place where you live?: Yes      ?Need for Family Participation in Patient Care: Yes (Comment) ?Care giver support system in place?: Yes (comment) ?  ?Criminal Activity/Legal Involvement Pertinent to Current Situation/Hospitalization: No - Comment as needed ? ?Activities of Daily Living ?Home Assistive Devices/Equipment: Cane (specify quad or straight), Shower chair with back ?ADL Screening (condition at time of admission) ?Patient's cognitive ability adequate to safely complete daily activities?: Yes ?Is the patient deaf or have difficulty hearing?: No ?Does the patient have difficulty seeing, even when wearing glasses/contacts?: No ?Does the patient have difficulty concentrating, remembering, or making decisions?: No ?Patient able to express need for assistance with ADLs?: Yes ?Does the patient have difficulty dressing or bathing?: No ?Independently performs ADLs?: Yes (appropriate for developmental age) ?Does the patient have difficulty walking or climbing stairs?: No ?Weakness of Legs: Both ?Weakness of Arms/Hands: None ? ?  ?  ?Alcohol / Substance Use: Not Applicable ?Psych Involvement: No  (comment) ? ?Admission diagnosis:  Hyperglycemia [R73.9] ?Elevated troponin [R77.8] ?Acute respiratory failure with hypoxia ([place]) [J96.01] ?Acute hypoxemic respiratory failure due to COVID-19 ([place]) [U07.1, J96.01] ?COVID [U07.1] ?Acute respiratory failure due to COVID-19 ([facility]) [U07.1, J96.00] ?Patient Active Problem List  ? Diagnosis Date Noted  ? Acute respiratory failure due to COVID-19 [facility]) 04/21/2021  ? Acute hypoxemic respiratory failure due to COVID-19 [facility]) 04/20/2021  ? Closed wedge compression fracture of fifth lumbar vertebra ([place]) 04/20/2021  ? Elevated troponin 04/20/2021  ? Hypocalcemia 04/20/2021  ? Rhabdomyolysis 04/20/2021  ? Synovial cyst of lumbar facet joint 09/25/2020  ? Rectal prolapse 12/30/2019  ? Full thickness rotator cuff tear 10/04/2019  ? Anterior shoulder dislocation, right, initial encounter 10/29/2016  ? Falls 03/26/2016  ? Anxiety and depression 03/26/2016  ? Altered mental status 03/25/2016  ? Abnormal nuclear stress test 07/05/2014  ? Fatty liver 03/26/2014  ? Intertrigo 03/24/2014  ? Allergic rhinitis 01/02/2014  ? Incontinence, feces 11/10/2013  ? Musculoskeletal pain 11/08/2013  ? Dysuria 04/16/2013  ? Benign paroxysmal positional vertigo 07/19/2012  ? Obese 09/17/2011  ? Knee pain 09/17/2011  ? Weight gain 06/17/2011  ? DOE (dyspnea on exertion) 04/15/2011  ? OSA (obstructive sleep apnea) 03/12/2011  ? Insomnia 01/15/2011  ? Type 2 diabetes mellitus with hyperglycemia ([place]) 11/27/2010  ? PVC (premature ventricular contraction) 11/27/2010  ? Fibromyalgia   ? Depression   ? GERD (gastroesophageal reflux disease)   ? Hyperlipidemia   ? Hypertension   ? Personal history of colonic polyps   ?  Migraine   ? ?PCP:  Sherald Hess., MD ?Pharmacy:   ?[facility] [address] [place], [address] ?[place] ?[place] [address] ?Phone: [phone] Fax: [phone] ? ?[facility] - [place], [place] - [address] ?[address] ?[address] ?[place] [address] ?Phone: [phone] Fax: [phone] ? ? ? ? ?Readmission Risk Interventions ?No flowsheet data found. ? ? ?

## 2021-04-22 LAB — BASIC METABOLIC PANEL
Anion gap: 9 (ref 5–15)
BUN: 18 mg/dL (ref 8–23)
CO2: 22 mmol/L (ref 22–32)
Calcium: 8.2 mg/dL — ABNORMAL LOW (ref 8.9–10.3)
Chloride: 105 mmol/L (ref 98–111)
Creatinine, Ser: 0.61 mg/dL (ref 0.44–1.00)
GFR, Estimated: >60 mL/min (ref >60)
Glucose, Bld: 243 mg/dL — ABNORMAL HIGH (ref 70–99)
Potassium: 4.1 mmol/L (ref 3.5–5.1)
Sodium: 136 mmol/L (ref 135–145)

## 2021-04-22 LAB — C-REACTIVE PROTEIN: CRP: 4.4 mg/dL — ABNORMAL HIGH (ref <1.0)

## 2021-04-22 LAB — CBC WITH DIFFERENTIAL/PLATELET
Abs Immature Granulocytes: 0.02 10*3/uL (ref 0.00–0.07)
Basophils Absolute: 0 10*3/uL (ref 0.0–0.1)
Basophils Relative: 0 %
Eosinophils Absolute: 0 10*3/uL (ref 0.0–0.5)
Eosinophils Relative: 0 %
HCT: 38.9 % (ref 36.0–46.0)
Hemoglobin: 13 g/dL (ref 12.0–15.0)
Immature Granulocytes: 0 %
Lymphocytes Relative: 11 %
Lymphs Abs: 0.7 10*3/uL (ref 0.7–4.0)
MCH: 29.9 pg (ref 26.0–34.0)
MCHC: 33.4 g/dL (ref 30.0–36.0)
MCV: 89.4 fL (ref 80.0–100.0)
Monocytes Absolute: 0.3 10*3/uL (ref 0.1–1.0)
Monocytes Relative: 4 %
Neutro Abs: 5.6 10*3/uL (ref 1.7–7.7)
Neutrophils Relative %: 85 %
Platelets: 141 10*3/uL — ABNORMAL LOW (ref 150–400)
RBC: 4.35 MIL/uL (ref 3.87–5.11)
RDW: 12.5 % (ref 11.5–15.5)
WBC: 6.6 10*3/uL (ref 4.0–10.5)
nRBC: 0 % (ref 0.0–0.2)

## 2021-04-22 LAB — GLUCOSE, CAPILLARY
Glucose-Capillary: 199 mg/dL — ABNORMAL HIGH (ref 70–99)
Glucose-Capillary: 234 mg/dL — ABNORMAL HIGH (ref 70–99)
Glucose-Capillary: 188 mg/dL — ABNORMAL HIGH (ref 70–99)
Glucose-Capillary: 283 mg/dL — ABNORMAL HIGH (ref 70–99)

## 2021-04-22 LAB — HEMOGLOBIN A1C
Hgb A1c MFr Bld: 6.3 % — ABNORMAL HIGH (ref 4.8–5.6)
Hgb A1c MFr Bld: 6.3 % — ABNORMAL HIGH (ref 4.8–5.6)
Mean Plasma Glucose: 134 mg/dL
Mean Plasma Glucose: 134 mg/dL

## 2021-04-22 LAB — MAGNESIUM: Magnesium: 1.6 mg/dL — ABNORMAL LOW (ref 1.7–2.4)

## 2021-04-22 LAB — PHOSPHORUS: Phosphorus: 2.9 mg/dL (ref 2.5–4.6)

## 2021-04-22 LAB — CK: Total CK: 867 U/L — ABNORMAL HIGH (ref 38–234)

## 2021-04-22 LAB — D-DIMER, QUANTITATIVE: D-Dimer, Quant: 0.63 ug/mL-FEU — ABNORMAL HIGH (ref 0.00–0.50)

## 2021-04-22 MED ORDER — MAGNESIUM OXIDE -MG SUPPLEMENT 400 (240 MG) MG PO TABS
400.0000 mg | ORAL_TABLET | Freq: Two times a day (BID) | ORAL | Status: AC
Start: 2021-04-22 — End: 2021-04-22
  Administered 2021-04-22 (×2): 400 mg via ORAL
  Filled 2021-04-22 (×2): qty 1

## 2021-04-22 MED ORDER — ZOLPIDEM TARTRATE 5 MG PO TABS
10.0000 mg | ORAL_TABLET | Freq: Every evening | ORAL | Status: DC | PRN
  Administered 2021-04-22: 10 mg via ORAL
  Filled 2021-04-22: qty 2

## 2021-04-22 MED ORDER — INSULIN DETEMIR 100 UNIT/ML ~~LOC~~ SOLN
5.0000 [IU] | Freq: Two times a day (BID) | SUBCUTANEOUS | Status: DC
  Administered 2021-04-22 – 2021-04-23 (×3): 5 [IU] via SUBCUTANEOUS
  Filled 2021-04-22 (×4): qty 0.05

## 2021-04-22 MED ORDER — MELATONIN 5 MG PO TABS
5.0000 mg | ORAL_TABLET | Freq: Every evening | ORAL | Status: DC | PRN
  Administered 2021-04-22: 5 mg via ORAL
  Filled 2021-04-22: qty 1

## 2021-04-22 MED ORDER — ATORVASTATIN CALCIUM 40 MG PO TABS: 40.0000 mg | ORAL_TABLET | Freq: Every day | ORAL | Status: DC

## 2021-04-22 MED ORDER — MELATONIN 5 MG PO TABS
5.0000 mg | ORAL_TABLET | Freq: Once | ORAL | Status: AC
  Administered 2021-04-22: 5 mg via ORAL
  Filled 2021-04-22: qty 1

## 2021-04-22 NOTE — Progress Notes (Signed)
[facility] PROGRESS NOTE    Progress Note  Kimberly Lin  BPZ:[id number] DOB: February 10, 1946 DOA: 04/20/2021 PCP: Sherald Hess., MD     Brief Narrative:   Kimberly Lin is an 76 y.o. female past medical history significant for an abdominal mass, diabetes mellitus type 2 history of septic shock secondary to UTI spinal stenosis who presented with [place] with complaint of headache rhinorrhea sore throat fatigue dyspnea on exertion this started 3 days prior to admission she went to see her primary care doctor who prescribed her Augmentin which she has been taking for 2 days prior to admission she started having a fever with nausea and vomiting came into the ED was found to be hypoxic, was found to be COVID-19 positive with a white count of 11 potassium of 3.1 blood glucose 200 cardiac biomarkers flat    Assessment/Plan:   Acute hypoxemic respiratory failure due to COVID-19 [facility]): Continue on Paxlovid twice a day. Tmax of 98.2. She was continued on Pulmicort twice a day. Inflammatory markers are improving, ambulating check saturations with ambulation. Inflammatory markers was mildly elevated. She relate she has been having some nausea this morning did not get a good night sleep.  Nontraumatic rhabdomyolysis: CK still elevated at 2000 we will bolus her normal saline continue IV fluids and recheck CK in the morning.  Elevated troponins: Likely demand ischemia continue aspirin.    Elevated LFTs: Likely due to rhabdomyolysis.  Mechanical fall: She did hit her head CT scan of the head showed no acute findings,. X-ray of the hip showed no acute fractures. 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  GERD (gastroesophageal reflux disease): Continue PPI.  Essential hypertension: Continue amlodipine and  metoprolol. Her blood pressure is well controlled.  Diabetes mellitus type 2: Continue metformin twice a day and Ozempic weekly. Blood glucose significantly elevated this morning, on sliding scale, start on long-acting insulin.  Obstructive sleep apnea: No longer on CPAP.  Anxiety and depression: Continue medications at home regimen.  Hypocalcemia: Corrected for her albumin is within normal. is    DVT prophylaxis: lovenox Family Communication:husband Status is: Observation The patient will require care spanning > 2 midnights and should be moved to inpatient because: Acute respiratory failure with hypoxia due to COVID and rhabdomyolysis.    Code Status:     Code Status Orders  (From admission, onward)           Start     Ordered   04/20/21 1111  Full code  Continuous        04/20/21 1110           Code Status History     Date Active Date Inactive Code Status Order ID Comments User Context   09/25/2020 1403 09/26/2020 1714 Full Code [id number]  Earnie Larsson, MD Inpatient   12/30/2019 1606 01/02/2020 1716 Full Code [id number]  Leighton Ruff, MD Inpatient   03/26/2016 0342 03/28/2016 1752 Full Code [id number]  Norval Morton, MD Inpatient   03/26/2016 0342 03/26/2016 0342 Full Code [id number]  Roney Jaffe, MD Inpatient   07/05/2014 1123 07/05/2014 1659 Full Code [id number]  Sherren Mocha, MD Inpatient      Advance Directive Documentation    Flowsheet Row Most Recent Value  Type of Advance Directive Healthcare Power of Attorney, Living will  Pre-existing out of facility DNR order (yellow  form or pink MOST form) --  "MOST" Form in Place? --         IV Access:   Peripheral IV   Procedures and diagnostic studies:   DG Knee 1-2 Views Right  Result Date: 04/20/2021 CLINICAL DATA:  Fall with bruising. EXAM: RIGHT KNEE - 1-2 VIEW COMPARISON:  None. FINDINGS: Right knee medial hemiarthroplasty. No hardware complication or periprosthetic fracture identified.  No joint effusion. Enthesophytes at the quadriceps insertion and patellar tendon origin. Subtle chondrocalcinosis within the lateral meniscus. IMPRESSION: No acute osseous abnormality. 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 Electronically Signed   By: Abigail Miyamoto M.D.   On: 04/20/2021 17:48   CT Head Wo Contrast  Result Date: 04/20/2021 CLINICAL DATA:  76 year old female with acute head and neck pain following fall. Initial encounter. EXAM: CT HEAD WITHOUT CONTRAST CT CERVICAL SPINE WITHOUT CONTRAST TECHNIQUE: Multidetector CT imaging of the head and cervical spine was performed following the standard protocol without intravenous contrast. Multiplanar CT image reconstructions of the cervical spine were also generated. RADIATION DOSE REDUCTION: This exam was performed according to the departmental dose-optimization program which includes automated exposure control, adjustment of the mA and/or kV according to patient size and/or use of iterative reconstruction technique. COMPARISON:  04/25/2020 head CT and prior studies FINDINGS: CT HEAD FINDINGS Brain: No evidence of acute infarction, hemorrhage, hydrocephalus, extra-axial collection or mass lesion/mass effect. Atrophy and chronic small-vessel white matter ischemic changes again noted. Vascular: No hyperdense vessel or unexpected calcification. Skull: Normal. Negative for fracture or focal lesion. Sinuses/Orbits: No acute abnormality. Mild mucosal thickening in scattered paranasal sinuses noted. Other: None CT CERVICAL SPINE FINDINGS Alignment: Normal. Skull base and vertebrae: No acute fracture. No primary bone lesion or focal pathologic process. Soft tissues and spinal canal: No prevertebral fluid or swelling. No visible canal hematoma. Disc levels: Unchanged multilevel mild degenerative disc disease/spondylosis and facet arthropathy noted. Mild central spinal and bony foraminal narrowing again  identified at C5-6. Upper chest: No acute abnormality Other: None IMPRESSION: 1. No evidence of acute intracranial abnormality. Atrophy and chronic small-vessel white matter ischemic changes. 2. No static evidence of acute injury to the cervical spine. Electronically Signed   By: Margarette Canada M.D.   On: 04/20/2021 09:16   CT Cervical Spine Wo Contrast  Result Date: 04/20/2021 CLINICAL DATA:  76 year old female with acute head and neck pain following fall. Initial encounter. EXAM: CT HEAD WITHOUT CONTRAST CT CERVICAL SPINE WITHOUT CONTRAST TECHNIQUE: Multidetector CT imaging of the head and cervical spine was performed following the standard protocol without intravenous contrast. Multiplanar CT image reconstructions of the cervical spine were also generated. RADIATION DOSE REDUCTION: This exam was performed according to the departmental dose-optimization program which includes automated exposure control, adjustment of the mA and/or kV according to patient size and/or use of iterative reconstruction technique. COMPARISON:  04/25/2020 head CT and prior studies FINDINGS: CT HEAD FINDINGS Brain: No evidence of acute infarction, hemorrhage, hydrocephalus, extra-axial collection or mass lesion/mass effect. Atrophy and chronic small-vessel white matter ischemic changes again noted. Vascular: No hyperdense vessel or unexpected calcification. Skull: Normal. Negative for fracture or focal lesion. Sinuses/Orbits: No acute abnormality. Mild mucosal thickening in scattered paranasal sinuses noted. Other: None CT CERVICAL SPINE FINDINGS Alignment: Normal. Skull base and vertebrae: No acute fracture. No primary bone lesion or focal pathologic process. Soft tissues and spinal canal: No prevertebral fluid or swelling. No visible canal hematoma. Disc levels: Unchanged multilevel mild degenerative disc disease/spondylosis and facet  arthropathy noted. Mild central spinal and bony foraminal narrowing again identified at C5-6. Upper  chest: No acute abnormality Other: None IMPRESSION: 1. No evidence of acute intracranial abnormality. Atrophy and chronic small-vessel white matter ischemic changes. 2. No static evidence of acute injury to the cervical spine. Electronically Signed   By: Margarette Canada M.D.   On: 04/20/2021 09:16   ECHOCARDIOGRAM COMPLETE  Result Date: 04/21/2021    ECHOCARDIOGRAM REPORT   Patient Name:   Kimberly Lin Date of Exam: 04/21/2021 Medical Rec #:  [id number]       Height:       63.5 in Accession #:    [id number]      Weight:       150.0 lb Date of Birth:  07/22/1945       BSA:          1.721 m Patient Age:    55 years        BP:           148/71 mmHg Patient Gender: F               HR:           74 bpm. Exam Location:  Inpatient Procedure: 2D Echo, 3D Echo, Cardiac Doppler, Color Doppler and Strain Analysis Indications:    Elevated Troponin  History:        Patient has prior history of Echocardiogram examinations, most                 recent 12/15/2012. Risk Factors:Diabetes, Dyslipidemia and                 Hypertension. GERD.  Sonographer:    Darlina Sicilian RDCS Referring Phys: [id number] DAVID MANUEL [place]  1. Global longitudinal strain is -20.1%. Left ventricular ejection fraction, by estimation, is 65 to 70%. The left ventricle has normal function. The left ventricle has no regional wall motion abnormalities. Left ventricular diastolic parameters are indeterminate.  2. Right ventricular systolic function is normal. The right ventricular size is normal.  3. The mitral valve is normal in structure. Mild mitral valve regurgitation.  4. The aortic valve is tricuspid. Aortic valve regurgitation is not visualized.  5. The inferior vena cava is normal in size with greater than 50% respiratory variability, suggesting right atrial pressure of 3 mmHg. Comparison(s): The left ventricular function is unchanged. FINDINGS  Left Ventricle: Global longitudinal strain is -20.1%. Left ventricular ejection fraction, by  estimation, is 65 to 70%. The left ventricle has normal function. The left ventricle has no regional wall motion abnormalities. The left ventricular internal cavity size was normal in size. There is no left ventricular hypertrophy. Left ventricular diastolic parameters are indeterminate. Right Ventricle: The right ventricular size is normal. Right vetricular wall thickness was not assessed. Right ventricular systolic function is normal. Left Atrium: Left atrial size was normal in size. Right Atrium: Right atrial size was normal in size. Pericardium: There is no evidence of pericardial effusion. Mitral Valve: The mitral valve is normal in structure. Mild mitral valve regurgitation. Tricuspid Valve: The tricuspid valve is normal in structure. Tricuspid valve regurgitation is trivial. Aortic Valve: The aortic valve is tricuspid. Aortic valve regurgitation is not visualized. Pulmonic Valve: The pulmonic valve was normal in structure. Pulmonic valve regurgitation is trivial. Aorta: The aortic root and ascending aorta are structurally normal, with no evidence of dilitation. Venous: The inferior vena cava is normal in size with greater than 50% respiratory variability, suggesting right  atrial pressure of 3 mmHg. IAS/Shunts: No atrial level shunt detected by color flow Doppler.  LEFT VENTRICLE PLAX 2D LVIDd:         4.60 cm   Diastology LVIDs:         2.45 cm   LV e' medial:    4.73 cm/s LV PW:         1.10 cm   LV E/e' medial:  13.4 LV IVS:        1.10 cm   LV e' lateral:   4.65 cm/s LVOT diam:     2.20 cm   LV E/e' lateral: 13.6 LV SV:         75 LV SV Index:   44 LVOT Area:     3.80 cm  RIGHT VENTRICLE RV S prime:     15.60 cm/s TAPSE (M-mode): 1.7 cm LEFT ATRIUM             Index        RIGHT ATRIUM           Index LA diam:        3.70 cm 2.15 cm/m   RA Area:     11.20 cm LA Vol (A2C):   42.8 ml 24.87 ml/m  RA Volume:   21.30 ml  12.38 ml/m LA Vol (A4C):   52.0 ml 30.21 ml/m LA Biplane Vol: 51.9 ml 30.15 ml/m   AORTIC VALVE LVOT Vmax:   84.20 cm/s LVOT Vmean:  61.400 cm/s LVOT VTI:    0.198 m  AORTA Ao Root diam: 3.20 cm Ao Asc diam:  3.50 cm MITRAL VALVE MV Area (PHT):  2.36 cm   SHUNTS MV Area (plan): 4.95 cm   Systemic VTI:  0.20 m MV Decel Time:  322 msec   Systemic Diam: 2.20 cm MV E velocity: 63.40 cm/s MV A velocity: 90.30 cm/s MV E/A ratio:  0.70 Dorris Carnes MD Electronically signed by Dorris Carnes MD Signature Date/Time: 04/21/2021/2:36:02 PM    Final    DG HIP UNILAT WITH PELVIS 2-3 VIEWS RIGHT  Result Date: 04/20/2021 CLINICAL DATA:  Hip pain EXAM: DG HIP (WITH OR WITHOUT PELVIS) 2-3V RIGHT COMPARISON:  None. FINDINGS: Hip joints and SI joints symmetric. No acute bony abnormality. Specifically, no fracture, subluxation, or dislocation. IMPRESSION: No acute bony abnormality. Electronically Signed   By: Rolm Baptise M.D.   On: 04/20/2021 17:49     Medical Consultants:   None.   Subjective:    Kimberly Lin some nausea this morning .  Objective:    Vitals:   04/21/21 1223 04/21/21 2028 04/21/21 2039 04/22/21 0534  BP: (!) 148/71  135/70 (!) 146/84  Pulse: 71  74 69  Resp: '18  18 16  '$ Temp: 97.7 F (36.5 C)  98.2 F (36.8 C) 97.6 F (36.4 C)  TempSrc: Oral  Oral Oral  SpO2: 96% 97% 95% 97%  Weight:      Height:       SpO2: 97 % O2 Flow Rate (L/min): 2 L/min   Intake/Output Summary (Last 24 hours) at 04/22/2021 0828 Last data filed at 04/21/2021 1700 Gross per 24 hour  Intake 1046.06 ml  Output 200 ml  Net 846.06 ml    Filed Weights   04/20/21 0724  Weight: 68 kg    Exam: General exam: In no acute distress. Respiratory system: Good air movement and clear to auscultation. Cardiovascular system: S1 & S2 heard, RRR. No JVD. Gastrointestinal system: Abdomen is nondistended, soft  and nontender.  Extremities: No pedal edema. Skin: No rashes, lesions or ulcers Psychiatry: Judgement and insight appear normal. Mood & affect appropriate.   Data Reviewed:     Labs: Basic Metabolic Panel: Recent Labs  Lab 04/20/21 0744 04/21/21 0453 04/22/21 0354  NA 133* 134* 136  K 3.1* 4.3 4.1  CL 100 105 105  CO2 21* 22 22  GLUCOSE 215* 203* 243*  BUN '21 22 18  '$ CREATININE 0.97 0.64 0.61  CALCIUM 8.2* 8.2* 8.2*  MG  --  1.7 1.6*  PHOS  --  3.2 2.9    GFR Estimated Creatinine Clearance: 57 mL/min (by C-G formula based on SCr of 0.61 mg/dL). Liver Function Tests: Recent Labs  Lab 04/20/21 1451 04/21/21 0453  AST 73* 63*  ALT 26 22  ALKPHOS 48 41  BILITOT 0.4 0.3  PROT 6.5 6.1*  ALBUMIN 3.3* 3.0*    No results for input(s): LIPASE, AMYLASE in the last 168 hours. No results for input(s): AMMONIA in the last 168 hours. Coagulation profile No results for input(s): INR, PROTIME in the last 168 hours. COVID-19 Labs  Recent Labs    04/20/21 0744 04/20/21 1015 04/21/21 0453 04/22/21 0354  DDIMER 1.05*  --  0.65* 0.63*  FERRITIN  --  196 209  --   CRP  --  10.7* 11.2* 4.4*     Lab Results  Component Value Date   SARSCOV2NAA POSITIVE (A) 04/20/2021   SARSCOV2NAA NEGATIVE 09/25/2020   [place] NEGATIVE 12/27/2019    CBC: Recent Labs  Lab 04/20/21 0744 04/21/21 0453 04/22/21 0354  WBC 11.0* 5.6 6.6  NEUTROABS 9.2* 4.4 5.6  HGB 14.6 13.2 13.0  HCT 42.6 39.1 38.9  MCV 87.8 90.9 89.4  PLT 175 142* 141*    Cardiac Enzymes: Recent Labs  Lab 04/20/21 1451 04/21/21 0453 04/22/21 0354  CKTOTAL 2,498* 1,954* 867*    BNP (last 3 results) No results for input(s): PROBNP in the last 8760 hours. CBG: Recent Labs  Lab 04/21/21 0814 04/21/21 1136 04/21/21 1603 04/21/21 2037 04/22/21 0743  GLUCAP 161* 172* 160* 179* 199*    D-Dimer: Recent Labs    04/21/21 0453 04/22/21 0354  DDIMER 0.65* 0.63*    Hgb A1c: No results for input(s): HGBA1C in the last 72 hours. Lipid Profile: No results for input(s): CHOL, HDL, LDLCALC, TRIG, CHOLHDL, LDLDIRECT in the last 72 hours. Thyroid function studies: No results  for input(s): TSH, T4TOTAL, T3FREE, THYROIDAB in the last 72 hours.  Invalid input(s): FREET3 Anemia work up: Recent Labs    04/20/21 1015 04/21/21 0453  FERRITIN 196 209    Sepsis Labs: Recent Labs  Lab 04/20/21 0744 04/21/21 0453 04/22/21 0354  PROCALCITON 0.98  --   --   WBC 11.0* 5.6 6.6    Microbiology Recent Results (from the past 240 hour(s))  Resp Panel by RT-PCR (Flu A&B, Covid) Nasopharyngeal Swab     Status: Abnormal   Collection Time: 04/20/21  7:44 AM   Specimen: Nasopharyngeal Swab; Nasopharyngeal(NP) swabs in vial transport medium  Result Value Ref Range Status   SARS Coronavirus 2 by RT PCR POSITIVE (A) NEGATIVE Final    Comment: (NOTE) SARS-CoV-2 target nucleic acids are DETECTED.  The SARS-CoV-2 RNA is generally detectable in upper respiratory specimens during the acute phase of infection. Positive results are indicative of the presence of the identified virus, but do not rule out bacterial infection or co-infection with other pathogens not detected by the test. Clinical correlation with patient history  and other diagnostic information is necessary to determine patient infection status. The expected result is Negative.  Fact Sheet for Patients: EntrepreneurPulse.com.au  Fact Sheet for Healthcare Providers: IncredibleEmployment.be  This test is not yet approved or cleared by the [place] FDA and  has been authorized for detection and/or diagnosis of SARS-CoV-2 by FDA under an Emergency Use Authorization (EUA).  This EUA will remain in effect (meaning this test can be used) for the duration of  the COVID-19 declaration under Section 564(b)(1) of the A ct, 21 U.S.C. section 360bbb-3(b)(1), unless the authorization is terminated or revoked sooner.     Influenza A by PCR NEGATIVE NEGATIVE Final   Influenza B by PCR NEGATIVE NEGATIVE Final    Comment: (NOTE) The Xpert Xpress SARS-CoV-2/FLU/RSV plus assay is  intended as an aid in the diagnosis of influenza from Nasopharyngeal swab specimens and should not be used as a sole basis for treatment. Nasal washings and aspirates are unacceptable for Xpert Xpress SARS-CoV-2/FLU/RSV testing.  Fact Sheet for Patients: EntrepreneurPulse.com.au  Fact Sheet for Healthcare Providers: IncredibleEmployment.be  This test is not yet approved or cleared by the [place] FDA and has been authorized for detection and/or diagnosis of SARS-CoV-2 by FDA under an Emergency Use Authorization (EUA). This EUA will remain in effect (meaning this test can be used) for the duration of the COVID-19 declaration under Section 564(b)(1) of the Act, 21 U.S.C. section 360bbb-3(b)(1), unless the authorization is terminated or revoked.  Performed at [facility], [address] [place], [place] [address]   Culture, blood (routine x 2)     Status: None (Preliminary result)   Collection Time: 04/20/21  2:50 PM   Specimen: BLOOD  Result Value Ref Range Status   Specimen Description   Final    BLOOD LEFT ANTECUBITAL Performed at [facility] [address] [place], [place] [address]    Special Requests   Final    BOTTLES DRAWN AEROBIC ONLY Blood Culture adequate volume Performed at [facility] [address], [place], [place] [address]    Culture   Final    NO GROWTH 2 DAYS Performed at [facility] [address] [place], [place] [address]    Report Status PENDING  Incomplete  Culture, blood (routine x 2)     Status: None (Preliminary result)   Collection Time: 04/20/21  2:51 PM   Specimen: BLOOD  Result Value Ref Range Status   Specimen Description   Final    BLOOD RIGHT ANTECUBITAL Performed at [facility] [address] [place], [place] [address]    Special Requests   Final    BOTTLES DRAWN AEROBIC ONLY Blood Culture adequate  volume Performed at [facility] [address] [place], [place] [address]    Culture   Final    NO GROWTH 2 DAYS Performed at [facility] [address], [place], [place] [address]    Report Status PENDING  Incomplete     Medications:    amLODipine  5 mg Oral Daily   vitamin C  500 mg Oral Daily   aspirin EC  81 mg Oral QHS   balsalazide  2,250 mg Oral BID   budesonide  6 mg Oral Daily   budesonide (PULMICORT) nebulizer solution  0.5 mg Nebulization BID   diazepam  10 mg Oral Daily   diphenhydrAMINE  50 mg Oral QHS   insulin aspart  0-15 Units  Subcutaneous TID WC   insulin aspart  0-5 Units Subcutaneous QHS   insulin aspart  4 Units Subcutaneous TID WC   ipratropium-albuterol  3 mL Nebulization BID   metFORMIN  500 mg Oral BID WC   metoprolol succinate  50 mg Oral Daily   metoprolol tartrate  25 mg Oral Daily   nirmatrelvir/ritonavir EUA  3 tablet Oral BID   pantoprazole  40 mg Oral QHS   traZODone  100 mg Oral QHS   venlafaxine XR  150 mg Oral q morning   zinc sulfate  220 mg Oral Daily   Continuous Infusions:  lactated ringers 75 mL/hr at 04/22/21 0536      LOS: 1 day   Charlynne Cousins  [facility]  04/22/2021, 8:28 AM

## 2021-04-23 LAB — CBC WITH DIFFERENTIAL/PLATELET
Abs Immature Granulocytes: 0.04 10*3/uL (ref 0.00–0.07)
Basophils Absolute: 0 10*3/uL (ref 0.0–0.1)
Basophils Relative: 0 %
Eosinophils Absolute: 0 10*3/uL (ref 0.0–0.5)
Eosinophils Relative: 0 %
HCT: 39.4 % (ref 36.0–46.0)
Hemoglobin: 13.2 g/dL (ref 12.0–15.0)
Immature Granulocytes: 1 %
Lymphocytes Relative: 9 %
Lymphs Abs: 0.7 10*3/uL (ref 0.7–4.0)
MCH: 29.6 pg (ref 26.0–34.0)
MCHC: 33.5 g/dL (ref 30.0–36.0)
MCV: 88.3 fL (ref 80.0–100.0)
Monocytes Absolute: 0.3 10*3/uL (ref 0.1–1.0)
Monocytes Relative: 4 %
Neutro Abs: 6.4 10*3/uL (ref 1.7–7.7)
Neutrophils Relative %: 86 %
Platelets: 160 10*3/uL (ref 150–400)
RBC: 4.46 MIL/uL (ref 3.87–5.11)
RDW: 12.3 % (ref 11.5–15.5)
WBC: 7.5 10*3/uL (ref 4.0–10.5)
nRBC: 0 % (ref 0.0–0.2)

## 2021-04-23 LAB — GLUCOSE, CAPILLARY: Glucose-Capillary: 174 mg/dL — ABNORMAL HIGH (ref 70–99)

## 2021-04-23 LAB — CK: Total CK: 334 U/L — ABNORMAL HIGH (ref 38–234)

## 2021-04-23 LAB — PHOSPHORUS: Phosphorus: 3.3 mg/dL (ref 2.5–4.6)

## 2021-04-23 LAB — C-REACTIVE PROTEIN: CRP: 1.2 mg/dL — ABNORMAL HIGH (ref <1.0)

## 2021-04-23 LAB — MAGNESIUM: Magnesium: 1.6 mg/dL — ABNORMAL LOW (ref 1.7–2.4)

## 2021-04-23 LAB — D-DIMER, QUANTITATIVE: D-Dimer, Quant: 0.61 ug/mL-FEU — ABNORMAL HIGH (ref 0.00–0.50)

## 2021-04-23 MED ORDER — NIRMATRELVIR/RITONAVIR (PAXLOVID)TABLET: 3.0000 | ORAL_TABLET | Freq: Two times a day (BID) | ORAL | 0 refills | Status: AC

## 2021-04-23 MED ORDER — ATORVASTATIN CALCIUM 40 MG PO TABS: 40.0000 mg | ORAL_TABLET | Freq: Every day | ORAL | Status: AC

## 2021-04-23 NOTE — TOC Transition Note (Signed)
Transition of Care (TOC) - CM/SW Discharge Note ? ? ?Patient Details  ?Name: Kimberly Lin ?MRN: [id number] ?Date of Birth: 1945-06-29 ? ?Transition of Care [facility]) CM/SW Contact:  ?Dessa Phi, RN ?Phone Number: ?04/23/2021, 9:21 AM ? ? ?Clinical Narrative:   d/c home. No CM needs. ? ? ? ?Final next level of care: Home/Self Care ?Barriers to Discharge: No Barriers Identified ? ? ?Patient Goals and CMS Choice ?  ?  ?  ? ?Discharge Placement ?  ?           ?  ?  ?  ?  ? ?Discharge Plan and Services ?  ?  ?           ?  ?  ?  ?  ?  ?  ?  ?  ?  ?  ? ?Social Determinants of Health (SDOH) Interventions ?  ? ? ?Readmission Risk Interventions ?No flowsheet data found. ? ? ? ? ?

## 2021-04-23 NOTE — Discharge Summary (Signed)
Physician Discharge Summary  ?Kimberly Lin JQB:[id number] DOB: September 08, 1945 DOA: 04/20/2021 ? ?PCP: Sherald Hess., MD ? ?Admit date: 04/20/2021 ?Discharge date: 04/23/2021 ? ?Admitted From: Home ?Disposition:  home ? ?Recommendations for Outpatient Follow-up:  ?Follow up with PCP in 1-2 weeks ?Please obtain BMP/CBC in one week ? ? ?Home Health:No ?Equipment/Devices:None ? ?Discharge Condition:Stable ?CODE STATUS:Full ?Diet recommendation: Heart Healthy ? ?Brief/Interim Summary: ?76 y.o. female past medical history significant for an abdominal mass, diabetes mellitus type 2 history of septic shock secondary to UTI spinal stenosis who presented with med [place] with complaint of headache rhinorrhea sore throat fatigue dyspnea on exertion this started 3 days prior to admission she went to see her primary care doctor who prescribed her Augmentin which she has been taking for 2 days prior to admission she started having a fever with nausea and vomiting came into the ED was found to be hypoxic, was found to be COVID-19 positive with a white count of 11 potassium of 3.1 blood glucose 200 cardiac biomarkers flat ? ?Discharge Diagnoses:  ?Principal Problem: ?  Acute hypoxemic respiratory failure due to COVID-19 [facility]) ?Active Problems: ?  GERD (gastroesophageal reflux disease) ?  Hyperlipidemia ?  Hypertension ?  Type 2 diabetes mellitus with hyperglycemia (HCC) ?  OSA (obstructive sleep apnea) ?  Anxiety and depression ?  Elevated troponin ?  Hypocalcemia ?  Rhabdomyolysis ?  Acute respiratory failure due to COVID-19 [facility]) ? ?Acute respiratory failure with hypoxia due to COVID-19: ?She was started on Paxil with twice a day she was weaned to room air she will continue for total of 5 days. ?Continue Pulmicort twice a day. ?Her inflammatory markers improved she was able to ambulate without becoming hypoxic. ? ?Nontraumatic rhabdomyolysis: ?With a CK of 2000 resolved with fluid resuscitation. ? ?Elevated  troponins: ?In the setting of elevated CK she denied any chest pain. ? ?Mechanical fall: ?She did hit her head, CT scan of the head showed no acute findings. ?X-ray of the hip showed no fractures. ?X-ray of the knee showed no osseous abnormalities but some chondrocalcinosis in the lateral meniscus consistent with a CCP deposit. ?CT of the C-spine showed no acute fractures or dislocation: ?Essential hypertension: ?Change made to her medication continue current medication. ? ?Diabetes mellitus type 2 with hyperglycemia: ?Which is amatory medication continue current regimen. ? ?Obstructive sleep apnea: ?She is no longer on CPAP. ? ?Anxiety and depression: ?Continue current regimen. ? ?Discharge Instructions ? ?Discharge Instructions   ? ? Diet - low sodium heart healthy   Complete by: As directed ?  ? Increase activity slowly   Complete by: As directed ?  ? ?  ? ?Allergies as of 04/23/2021   ? ?   Reactions  ? Erythromycin Swelling  ? "all mycins"  ? Losartan Other (See Comments)  ? hyperkalemia  ? Prednisone Other (See Comments)  ? "Insomnia" and "makes me crazy"  ? ?  ? ?  ?Medication List  ?  ? ?TAKE these medications   ? ?amLODipine 10 MG tablet ?Commonly known as: NORVASC ?Take 1 tablet (10 mg total) by mouth daily. ?  ?aspirin 81 MG tablet ?Take 81 mg by mouth once a week. ?  ?atorvastatin 40 MG tablet ?Commonly known as: LIPITOR ?Take 1 tablet (40 mg total) by mouth at bedtime. ?Start taking on: April 27, 2021 ?What changed: These instructions start on April 27, 2021. If you are unsure what to do until then, ask your doctor or other  care provider. ?  ?balsalazide 750 MG capsule ?Commonly known as: COLAZAL ?Take 2,250 mg by mouth in the morning and at bedtime. ?  ?budesonide 3 MG 24 hr capsule ?Commonly known as: ENTOCORT EC ?Take 6 mg by mouth every morning. ?  ?diazepam 10 MG tablet ?Commonly known as: VALIUM ?10 mg See admin instructions. Insert one tablet into the vagina nightly ?  ?diphenhydrAMINE 25 MG  tablet ?Commonly known as: BENADRYL ?Take 50 mg by mouth at bedtime. ?  ?lidocaine 5 % ?Commonly known as: Lidoderm ?Place 1 patch onto the skin daily as needed (pain). Remove & Discard patch within 12 hours or as directed by MD ?  ?metFORMIN 500 MG tablet ?Commonly known as: GLUCOPHAGE ?Take 500 mg by mouth 2 (two) times daily with a meal. ?  ?metoprolol succinate 50 MG 24 hr tablet ?Commonly known as: TOPROL-XL ?Take 1 tablet (50 mg total) by mouth daily. ?What changed: when to take this ?  ?metoprolol tartrate 25 MG tablet ?Commonly known as: LOPRESSOR ?Take 25 mg by mouth every morning. ?  ?multivitamin with minerals Tabs tablet ?Take 1 tablet by mouth every morning. ?  ?naloxone 4 MG/0.1ML Liqd nasal spray kit ?Commonly known as: NARCAN ?0.4 mg once as needed (opioid overdose). ?  ?nirmatrelvir/ritonavir EUA 20 x 150 MG & 10 x 100MG Tabs ?Commonly known as: PAXLOVID ?Take 3 tablets by mouth 2 (two) times daily for 1 day. Patient GFR is >60 ml/l. ?Take nirmatrelvir (150 mg) two tablets twice daily for 5 days and ritonavir (100 mg) one tablet twice daily for 5 days. ?  ?omeprazole 40 MG capsule ?Commonly known as: PRILOSEC ?Take 40 mg by mouth at bedtime. ?  ?ondansetron 4 MG disintegrating tablet ?Commonly known as: ZOFRAN-ODT ?Take 4 mg by mouth every 8 (eight) hours as needed for nausea or vomiting. ?  ?oxyCODONE-acetaminophen 10-325 MG tablet ?Commonly known as: PERCOCET ?Take 1 tablet by mouth 3 (three) times daily. ?  ?Ozempic (0.25 or 0.5 MG/DOSE) 2 MG/1.5ML Sopn ?Generic drug: Semaglutide(0.25 or 0.5MG/DOS) ?Inject 0.5 mg into the skin every Wednesday. ?  ?tiZANidine 4 MG tablet ?Commonly known as: Zanaflex ?Take 1 tablet (4 mg total) by mouth every 8 (eight) hours as needed for muscle spasms. ?  ?traZODone 50 MG tablet ?Commonly known as: DESYREL ?Take 100 mg by mouth at bedtime. ?  ?venlafaxine XR 75 MG 24 hr capsule ?Commonly known as: EFFEXOR-XR ?TAKE TWO CAPSULES BY MOUTH ONCE DAILY ?What changed:   ?how much to take ?how to take this ?when to take this ?additional instructions ?  ?zolpidem 5 MG tablet ?Commonly known as: AMBIEN ?Take 10 mg by mouth at bedtime. ?  ? ?  ? ? ?Allergies  ?Allergen Reactions  ? Erythromycin Swelling  ?  "all mycins"  ? Losartan Other (See Comments)  ?  hyperkalemia  ? Prednisone Other (See Comments)  ?  "Insomnia" and "makes me crazy"  ? ? ?Consultations: ?None ? ? ?Procedures/Studies: ?DG Knee 1-2 Views Right ? ?Result Date: 04/20/2021 ?CLINICAL DATA:  Fall with bruising. EXAM: RIGHT KNEE - 1-2 VIEW COMPARISON:  None. FINDINGS: Right knee medial hemiarthroplasty. No hardware complication or periprosthetic fracture identified. No joint effusion. Enthesophytes at the quadriceps insertion and patellar tendon origin. Subtle chondrocalcinosis within the lateral meniscus. IMPRESSION: No acute osseous abnormality. 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 Electronically Signed   By: Abigail Miyamoto M.D.   On: 04/20/2021 17:48  ? ?CT Head Wo Contrast ? ?Result  Date: 04/20/2021 ?CLINICAL DATA:  76 year old female with acute head and neck pain following fall. Initial encounter. EXAM: CT HEAD WITHOUT CONTRAST CT CERVICAL SPINE WITHOUT CONTRAST TECHNIQUE: Multidetector CT imaging of the head and cervical spine was performed following the standard protocol without intravenous contrast. Multiplanar CT image reconstructions of the cervical spine were also generated. RADIATION DOSE REDUCTION: This exam was performed according to the departmental dose-optimization program which includes automated exposure control, adjustment of the mA and/or kV according to patient size and/or use of iterative reconstruction technique. COMPARISON:  04/25/2020 head CT and prior studies FINDINGS: CT HEAD FINDINGS Brain: No evidence of acute infarction, hemorrhage, hydrocephalus, extra-axial collection or mass lesion/mass effect. Atrophy and chronic small-vessel  white matter ischemic changes again noted. Vascular: No hyperdense vessel or unexpected calcification. Skull: Normal. Negative for fracture or focal lesion. Sinuses/Orbits: No acute abnormality. Mild mucosal thickening

## 2021-04-25 LAB — CULTURE, BLOOD (ROUTINE X 2)
Culture: NO GROWTH
Culture: NO GROWTH
Special Requests: ADEQUATE
Special Requests: ADEQUATE

## 2021-10-31 ENCOUNTER — Other Ambulatory Visit (HOSPITAL_BASED_OUTPATIENT_CLINIC_OR_DEPARTMENT_OTHER): Payer: Self-pay | Admitting: Family Medicine

## 2021-10-31 DIAGNOSIS — Z1231 Encounter for screening mammogram for malignant neoplasm of breast: Secondary | ICD-10-CM

## 2021-12-02 ENCOUNTER — Ambulatory Visit (HOSPITAL_BASED_OUTPATIENT_CLINIC_OR_DEPARTMENT_OTHER)
Admission: RE | Admit: 2021-12-02 | Discharge: 2021-12-02 | Disposition: A | Discharge status: Home / Self Care | Payer: Medicare Other | Source: Ambulatory Visit | Attending: Family Medicine | Admitting: Family Medicine

## 2021-12-02 ENCOUNTER — Encounter (HOSPITAL_BASED_OUTPATIENT_CLINIC_OR_DEPARTMENT_OTHER): Payer: Self-pay

## 2021-12-02 DIAGNOSIS — Z1231 Encounter for screening mammogram for malignant neoplasm of breast: Secondary | ICD-10-CM | POA: Insufficient documentation

## 2022-01-16 ENCOUNTER — Other Ambulatory Visit: Payer: Self-pay | Admitting: Urology

## 2022-02-06 ENCOUNTER — Encounter (HOSPITAL_BASED_OUTPATIENT_CLINIC_OR_DEPARTMENT_OTHER): Payer: Self-pay | Admitting: Urology

## 2022-02-06 NOTE — Progress Notes (Addendum)
Spoke w/ via phone for pre-op interview--- pt Lab needs dos----   I-stat            Lab results------ COVID test -----patient states asymptomatic no test needed Arrive at ------- 0530 NPO after MN NO Solid Food.  Clear liquids from MN until--- 0430 Med rec completed Medications to take morning of surgery ----- Lipitor, Metoprolol, Amlodipine Diabetic medication ----- Stop Ozempic. Do not take Metformin DOS Patient instructed no nail polish to be worn day of surgery Patient instructed to bring photo id and insurance card day of surgery Patient aware to have Driver (ride ) / caregiver    for 24 hours after surgery: Kimberly Lin Patient Special Instructions ----- Stop Aspirin 72 hours prior to procedure.  Pre-Op special Istructions ----- Patient verbalized understanding of instructions that were given at this phone interview. Patient denies shortness of breath, chest pain, fever, cough at this phone interview.

## 2022-02-12 NOTE — Anesthesia Preprocedure Evaluation (Signed)
Anesthesia Evaluation  Patient identified by MRN, date of birth, ID band Patient awake    Reviewed: Allergy & Precautions, NPO status , Patient's Chart, lab work & pertinent test results, reviewed documented beta blocker date and time   History of Anesthesia Complications Negative for: history of anesthetic complications  Airway Mallampati: III  TM Distance: >3 FB Neck ROM: Full    Dental  (+) Dental Advisory Given   Pulmonary sleep apnea , former smoker   Pulmonary exam normal        Cardiovascular hypertension, Pt. on medications and Pt. on home beta blockers Normal cardiovascular exam   '23 TTE - Normal EF, mild MR    Neuro/Psych  Headaches PSYCHIATRIC DISORDERS Anxiety Depression       GI/Hepatic Neg liver ROS, hiatal hernia,GERD  Controlled and Medicated,, Fecal incontinence    Endo/Other  diabetes, Type 2   On GLP1-a   Renal/GU negative Renal ROS  Female GU complaint     Musculoskeletal  (+) Arthritis ,  Fibromyalgia -, narcotic dependent  Abdominal   Peds  Hematology negative hematology ROS (+)   Anesthesia Other Findings   Reproductive/Obstetrics                             Anesthesia Physical Anesthesia Plan  ASA: 3  Anesthesia Plan: General   Post-op Pain Management: Tylenol PO (pre-op)*   Induction: Intravenous  PONV Risk Score and Plan: 3 and Treatment may vary due to age or medical condition and Ondansetron  Airway Management Planned: LMA  Additional Equipment: None  Intra-op Plan:   Post-operative Plan: Extubation in OR  Informed Consent: I have reviewed the patients History and Physical, chart, labs and discussed the procedure including the risks, benefits and alternatives for the proposed anesthesia with the patient or authorized representative who has indicated his/her understanding and acceptance.     Dental advisory given  Plan Discussed with:  CRNA and Anesthesiologist  Anesthesia Plan Comments:        Anesthesia Quick Evaluation

## 2022-02-13 ENCOUNTER — Encounter (HOSPITAL_BASED_OUTPATIENT_CLINIC_OR_DEPARTMENT_OTHER)
Admission: RE | Disposition: A | Discharge status: Home / Self Care | Payer: Self-pay | Source: Home / Self Care | Attending: Urology

## 2022-02-13 ENCOUNTER — Other Ambulatory Visit: Payer: Self-pay

## 2022-02-13 ENCOUNTER — Ambulatory Visit (HOSPITAL_BASED_OUTPATIENT_CLINIC_OR_DEPARTMENT_OTHER): Payer: Medicare Other | Admitting: Anesthesiology

## 2022-02-13 ENCOUNTER — Ambulatory Visit (HOSPITAL_BASED_OUTPATIENT_CLINIC_OR_DEPARTMENT_OTHER)
Admission: RE | Admit: 2022-02-13 | Discharge: 2022-02-13 | Disposition: A | Discharge status: Home / Self Care | Payer: Medicare Other | Attending: Urology | Admitting: Urology

## 2022-02-13 ENCOUNTER — Encounter (HOSPITAL_BASED_OUTPATIENT_CLINIC_OR_DEPARTMENT_OTHER): Payer: Self-pay | Admitting: Urology

## 2022-02-13 DIAGNOSIS — Z87891 Personal history of nicotine dependence: Secondary | ICD-10-CM | POA: Insufficient documentation

## 2022-02-13 DIAGNOSIS — Z7985 Long-term (current) use of injectable non-insulin antidiabetic drugs: Secondary | ICD-10-CM | POA: Insufficient documentation

## 2022-02-13 DIAGNOSIS — E119 Type 2 diabetes mellitus without complications: Secondary | ICD-10-CM | POA: Insufficient documentation

## 2022-02-13 DIAGNOSIS — F418 Other specified anxiety disorders: Secondary | ICD-10-CM

## 2022-02-13 DIAGNOSIS — I10 Essential (primary) hypertension: Secondary | ICD-10-CM

## 2022-02-13 DIAGNOSIS — N3941 Urge incontinence: Secondary | ICD-10-CM | POA: Diagnosis present

## 2022-02-13 DIAGNOSIS — K219 Gastro-esophageal reflux disease without esophagitis: Secondary | ICD-10-CM | POA: Diagnosis not present

## 2022-02-13 DIAGNOSIS — G4730 Sleep apnea, unspecified: Secondary | ICD-10-CM | POA: Insufficient documentation

## 2022-02-13 DIAGNOSIS — Z87440 Personal history of urinary (tract) infections: Secondary | ICD-10-CM | POA: Insufficient documentation

## 2022-02-13 DIAGNOSIS — N3020 Other chronic cystitis without hematuria: Secondary | ICD-10-CM | POA: Diagnosis not present

## 2022-02-13 DIAGNOSIS — Z01818 Encounter for other preprocedural examination: Secondary | ICD-10-CM

## 2022-02-13 DIAGNOSIS — Z7984 Long term (current) use of oral hypoglycemic drugs: Secondary | ICD-10-CM | POA: Insufficient documentation

## 2022-02-13 DIAGNOSIS — Z8719 Personal history of other diseases of the digestive system: Secondary | ICD-10-CM | POA: Diagnosis not present

## 2022-02-13 HISTORY — PX: CYSTOSCOPY: SHX5120

## 2022-02-13 LAB — POCT I-STAT, CHEM 8
BUN: 18 mg/dL (ref 8–23)
Calcium, Ion: 1.25 mmol/L (ref 1.15–1.40)
Chloride: 101 mmol/L (ref 98–111)
Creatinine, Ser: 0.6 mg/dL (ref 0.44–1.00)
Glucose, Bld: 132 mg/dL — ABNORMAL HIGH (ref 70–99)
HCT: 44 % (ref 36.0–46.0)
Hemoglobin: 15 g/dL (ref 12.0–15.0)
Potassium: 4.1 mmol/L (ref 3.5–5.1)
Sodium: 139 mmol/L (ref 135–145)
TCO2: 24 mmol/L (ref 22–32)

## 2022-02-13 LAB — GLUCOSE, CAPILLARY: Glucose-Capillary: 171 mg/dL — ABNORMAL HIGH (ref 70–99)

## 2022-02-13 SURGERY — CYSTOSCOPY
Anesthesia: General | Site: Vagina

## 2022-02-13 MED ORDER — PHENYLEPHRINE HCL (PRESSORS) 10 MG/ML IV SOLN
INTRAVENOUS | Status: DC | PRN
  Administered 2022-02-13: 80 ug via INTRAVENOUS

## 2022-02-13 MED ORDER — CLOBETASOL PROPIONATE 0.05 % EX CREA: 1.0000 | TOPICAL_CREAM | Freq: Two times a day (BID) | CUTANEOUS | 0 refills | Status: AC

## 2022-02-13 MED ORDER — CEFAZOLIN SODIUM-DEXTROSE 2-4 GM/100ML-% IV SOLN
INTRAVENOUS | Status: AC
  Filled 2022-02-13: qty 100

## 2022-02-13 MED ORDER — STERILE WATER FOR IRRIGATION IR SOLN
Status: DC | PRN
  Administered 2022-02-13: 3000 mL

## 2022-02-13 MED ORDER — ACETAMINOPHEN 500 MG PO TABS
ORAL_TABLET | ORAL | Status: AC
  Filled 2022-02-13: qty 2

## 2022-02-13 MED ORDER — ONDANSETRON HCL 4 MG/2ML IJ SOLN
INTRAMUSCULAR | Status: AC
  Filled 2022-02-13: qty 2

## 2022-02-13 MED ORDER — CEFAZOLIN SODIUM-DEXTROSE 2-4 GM/100ML-% IV SOLN
2.0000 g | INTRAVENOUS | Status: AC
  Administered 2022-02-13: 2 g via INTRAVENOUS

## 2022-02-13 MED ORDER — PHENAZOPYRIDINE HCL 200 MG PO TABS: 200.0000 mg | ORAL_TABLET | Freq: Three times a day (TID) | ORAL | 0 refills | Status: AC | PRN

## 2022-02-13 MED ORDER — DIPHENHYDRAMINE HCL 50 MG/ML IJ SOLN
INTRAMUSCULAR | Status: DC | PRN
  Administered 2022-02-13: 6.25 mg via INTRAVENOUS

## 2022-02-13 MED ORDER — EPHEDRINE SULFATE (PRESSORS) 50 MG/ML IJ SOLN
INTRAMUSCULAR | Status: DC | PRN
  Administered 2022-02-13 (×2): 10 mg via INTRAMUSCULAR
  Administered 2022-02-13: 5 mg via INTRAMUSCULAR

## 2022-02-13 MED ORDER — LACTATED RINGERS IV SOLN: INTRAVENOUS | Status: DC

## 2022-02-13 MED ORDER — ACETAMINOPHEN 500 MG PO TABS
1000.0000 mg | ORAL_TABLET | Freq: Once | ORAL | Status: AC
  Administered 2022-02-13: 1000 mg via ORAL

## 2022-02-13 MED ORDER — ONABOTULINUMTOXINA 100 UNITS IJ SOLR
INTRAMUSCULAR | Status: DC | PRN
  Administered 2022-02-13 (×2): 100 [IU] via INTRAMUSCULAR

## 2022-02-13 MED ORDER — LIDOCAINE HCL (CARDIAC) PF 100 MG/5ML IV SOSY
PREFILLED_SYRINGE | INTRAVENOUS | Status: DC | PRN
  Administered 2022-02-13: 60 mg via INTRAVENOUS

## 2022-02-13 MED ORDER — LIDOCAINE HCL (PF) 2 % IJ SOLN
INTRAMUSCULAR | Status: AC
  Filled 2022-02-13: qty 5

## 2022-02-13 MED ORDER — DIPHENHYDRAMINE HCL 50 MG/ML IJ SOLN
INTRAMUSCULAR | Status: AC
  Filled 2022-02-13: qty 1

## 2022-02-13 MED ORDER — EPHEDRINE SULFATE (PRESSORS) 50 MG/ML IJ SOLN
INTRAMUSCULAR | Status: DC | PRN
  Administered 2022-02-13: 10 mg via INTRAVENOUS

## 2022-02-13 MED ORDER — FENTANYL CITRATE (PF) 100 MCG/2ML IJ SOLN
INTRAMUSCULAR | Status: AC
  Filled 2022-02-13: qty 2

## 2022-02-13 MED ORDER — ONDANSETRON HCL 4 MG/2ML IJ SOLN
INTRAMUSCULAR | Status: DC | PRN
  Administered 2022-02-13: 4 mg via INTRAVENOUS

## 2022-02-13 MED ORDER — PROPOFOL 10 MG/ML IV BOLUS
INTRAVENOUS | Status: AC
  Filled 2022-02-13: qty 20

## 2022-02-13 MED ORDER — PROPOFOL 10 MG/ML IV BOLUS
INTRAVENOUS | Status: DC | PRN
  Administered 2022-02-13: 140 mg via INTRAVENOUS

## 2022-02-13 MED ORDER — FENTANYL CITRATE (PF) 100 MCG/2ML IJ SOLN
INTRAMUSCULAR | Status: DC | PRN
  Administered 2022-02-13 (×4): 25 ug via INTRAVENOUS

## 2022-02-13 SURGICAL SUPPLY — 21 items
BAG DRAIN URO-CYSTO SKYTR STRL (DRAIN) ×1 IMPLANT
BAG DRN UROCATH (DRAIN) ×1
CLOTH BEACON ORANGE TIMEOUT ST (SAFETY) ×1 IMPLANT
GLOVE BIO SURGEON STRL SZ7.5 (GLOVE) ×1 IMPLANT
GLOVE SURG SS PI 6.5 STRL IVOR (GLOVE) IMPLANT
GLOVE SURG SS PI 7.0 STRL IVOR (GLOVE) IMPLANT
GOWN STRL REUS W/ TWL LRG LVL3 (GOWN DISPOSABLE) IMPLANT
GOWN STRL REUS W/TWL LRG LVL3 (GOWN DISPOSABLE) ×1
GOWN STRL REUS W/TWL XL LVL3 (GOWN DISPOSABLE) ×2 IMPLANT
KIT TURNOVER CYSTO (KITS) ×1 IMPLANT
MANIFOLD NEPTUNE II (INSTRUMENTS) ×1 IMPLANT
NDL ASPIRATION 22 (NEEDLE) ×1 IMPLANT
NDL HYPO 18GX1.5 BLUNT FILL (NEEDLE) IMPLANT
NDL SAFETY ECLIP 18X1.5 (MISCELLANEOUS) IMPLANT
NEEDLE ASPIRATION 22 (NEEDLE) ×1 IMPLANT
NEEDLE HYPO 18GX1.5 BLUNT FILL (NEEDLE) ×2 IMPLANT
PACK CYSTO (CUSTOM PROCEDURE TRAY) ×1 IMPLANT
SYR 10ML LL (SYRINGE) IMPLANT
SYR CONTROL 10ML LL (SYRINGE) IMPLANT
TUBE CONNECTING 12X1/4 (SUCTIONS) IMPLANT
WATER STERILE IRR 3000ML UROMA (IV SOLUTION) ×1 IMPLANT

## 2022-02-13 NOTE — Transfer of Care (Signed)
Immediate Anesthesia Transfer of Care Note  Patient: Kimberly Lin  Procedure(s) Performed: Procedure(s) (LRB): CYSTOSCOPY WITH BLADDER BOTOX INJECTION (N/A)  Patient Location: PACU  Anesthesia Type: General  Level of Consciousness: awake, sedated, patient cooperative and responds to stimulation  Airway & Oxygen Therapy: Patient Spontanous Breathing and Patient connected to [place] oxygen  Post-op Assessment: Report given to PACU RN, Post -op Vital signs reviewed and stable and Patient moving all extremities  Post vital signs: Reviewed and stable  Complications: No apparent anesthesia complications

## 2022-02-13 NOTE — H&P (Signed)
Kimberly Lin comes in today for reevaluation. She was last seen 6 weeks ago, at which point we started her on 50 mg of Myrbetriq overactive bladder. She has had severe urgency and urge associated incontinence. She also has had recurrent urinary tract infections, which have been especially bad over the last 6 months. She has been on suppression antibiotics recently and at that point was doing well. However these were stopped and since they have been stopped she has had quite a difficult time getting her urinary tract infections under control.   Since the Myrbetriq was started she felt like it was helping initially, but then progressed back to almost how it was prior to taking the medication. She felt for 2 weeks that it was better, but recently has been significantly worse. She is also been treated for infection recently, does not think that is been completely resolved. She continues to have severe incontinence/flooding.   The patient has been doing quite a bit for UTI prevention strategies.   Patient's husband died 12-05-2021 of pancreatic cancer.   UDS, 6/23: The patient has severe hyperreflexive detrusor, but no evidence of stress urinary incontinence. She does have urge associated incontinence. She is able to empty her bladder fairly well. Her bladder capacity is normal.   12/24/2021: The patient is here today for follow-up. At her last office visit 3 months prior we put her on Vesicare in addition to the Myrbetriq. I also put her on suppression antibiotics. Since that time she has noted maybe some minimal improvement, but continues to have significant flooding incontinence, mostly urge associated. She is not having any burning. She denies any hematuria. She denies any new bone or back pain. She does have significantly less stress, as her husband has now passed and she is not his caregiver at the moment.   Interval: Today the patient is here for preop evaluation. She is scheduled for a cystoscopy  with Botox injection for overactive bladder and urge incontinence. This is scheduled next week. She had some questions regarding the upcoming surgery. She also wondered if she has prolapse or other findings that are significant. He continues to have significant urgency and associated urge incontinence. She wonders if she might have an infection, she has some mild dysuria as well today.     ALLERGIES: Ciprofloxacin - Itching Erythromycin - Swelling Prednisone - Other Reaction, "climb the walls"    MEDICATIONS: Cephalexin 250 mg capsule 1 capsule PO Q HS  Estrace 0.01 % cream with applicator 1 bead Per Vagina Every Other Day  Metformin Hcl  Metoprolol Succinate 50 mg tablet, extended release 24 hr  Myrbetriq 50 mg tablet, extended release 24 hr 1 tablet PO Daily  Percocet 10 mg-325 mg tablet  Trimethoprim 100 mg tablet 1 tablet PO Q HS  Vesicare 5 mg tablet 1 tablet PO Daily  Alprazolam 1 mg tablet  Ambien 10 mg tablet  Amlodipine Besylate 5 mg tablet  Atorvastatin Calcium 40 mg tablet  Benadryl 25 mg capsule  Calcium Carbonate  Clonazepam 0.5 mg tablet  Coenzyme Q10  Cranberry 200 mg capsule 2 capsule PO BID  Effexor Xr 75 mg capsule, ext release 24 hr  Gabapentin 100 mg capsule  Lexapro 20 mg tablet  Lo-Dose Aspirin Ec 81 mg tablet, delayed release  Low Dose Aspirin Ec 81 mg tablet, delayed release  Magnesium Oxide 400 mg (241.3 mg magnesium) tablet  Multi Vitamin  Ozempic  Protonix  Sertraline Hcl 50 mg tablet  Topiramate 25 mg tablet  Toprol Xl 50 mg tablet, extended release 24 hr  Victoza  Vitamin C 500 mg tablet 2 tablet PO Daily     GU PSH: Complex cystometrogram, w/ void pressure and urethral pressure profile studies, any technique - 08/05/2021 Complex Uroflow - 08/05/2021 Emg surf Electrd - 08/05/2021 Inject For cystogram - 08/05/2021 Intrabd voidng Press - 08/05/2021       PSH Notes: Cardiac catheterization (2016)   NON-GU PSH: Appendectomy (laparoscopic),  1983 Breast Biopsy, Right, 2007, 2009 Colonoscopy & Polypectomy, 1998 Drain Ovarian Cyst(s) - 2008 Knee replacement, Bilateral, medial partial 2006     GU PMH: Chronic cystitis (w/o hematuria) - 12/24/2021, - 09/24/2021, - 08/19/2021, - 06/24/2021, - 03/14/2021, - 02/18/2021, - 12/28/2020, - 2022, - 2022, - 2018 (Chronic), - 2018 Urge incontinence - 12/24/2021, - 09/24/2021, - 08/19/2021 Mixed incontinence - 08/05/2021, - 06/24/2021, - 03/14/2021, - 02/18/2021, - 12/28/2020 Dysuria - 06/24/2021, - 2018 Stress Incontinence - 2022, - 2022, - 2022, - 2022, - 2022, - 2022 Postmenopausal atrophic vaginitis (Chronic) - 2018    NON-GU PMH: Muscle weakness (generalized) - 2022, - 2022, - 2022, - 2022 Other specified disorders of muscle - 2022, - 2022, - 2022 Anxiety Arthritis Depression Diabetes Type 2 GERD Glaucoma Hypercholesterolemia Hypertension Noninfective gastroenteritis and colitis, unspecified Obstructive sleep apnea (adult) (pediatric)    FAMILY HISTORY: 2 sons - Son Breast Cancer - Mother Diabetes - Mother Heart Disease - Mother, Brother hyperlipidemia - Mother Hypertension - Mother, Brother Lung Cancer - Brother rectal cancer - Father   SOCIAL HISTORY: Marital Status: Married Preferred Language: English; Ethnicity: Not Hispanic Or Latino; Race: White Current Smoking Status: Patient has never smoked.   Tobacco Use Assessment Completed: Used Tobacco in last 30 days? Does drink.  Drinks 1 caffeinated drink per day.    REVIEW OF SYSTEMS:    GU Review Female:   Patient denies frequent urination, hard to postpone urination, burning /pain with urination, get up at night to urinate, leakage of urine, stream starts and stops, trouble starting your stream, have to strain to urinate, and being pregnant.  Gastrointestinal (Upper):   Patient denies nausea, indigestion/ heartburn, and vomiting.  Gastrointestinal (Lower):   Patient denies diarrhea and constipation.  Constitutional:   Patient  denies fever, night sweats, weight loss, and fatigue.  Skin:   Patient denies skin rash/ lesion and itching.  Eyes:   Patient denies blurred vision and double vision.  Ears/ Nose/ Throat:   Patient denies sore throat and sinus problems.  Hematologic/Lymphatic:   Patient denies swollen glands and easy bruising.  Cardiovascular:   Patient denies leg swelling and chest pains.  Respiratory:   Patient denies cough and shortness of breath.  Endocrine:   Patient denies excessive thirst.  Musculoskeletal:   Patient denies back pain and joint pain.  Neurological:   Patient denies headaches and dizziness.  Psychologic:   Patient denies depression and anxiety.   VITAL SIGNS: None   Complexity of Data:  Source Of History:  Patient  Records Review:   Previous Doctor Records, Previous Patient Records, POC Tool  Urine Test Review:   Urinalysis  Urodynamics Review:   Review Urodynamics Tests   PROCEDURES:          Urinalysis w/Scope Dipstick Dipstick Cont'd Micro  Color: Yellow Bilirubin: Neg mg/dL WBC/hpf: 20 - 40/hpf  Appearance: Slightly Cloudy Ketones: Neg mg/dL RBC/hpf: NS (Not Seen)  Specific Gravity: 1.025 Blood: Neg ery/uL Bacteria: Many (>50/hpf)  pH: 5.5 Protein: Trace mg/dL Cystals: NS (Not Seen)  Glucose: Trace mg/dL Urobilinogen: 0.2 mg/dL Casts: NS (Not Seen)    Nitrites: Positive Trichomonas: Not Present    Leukocyte Esterase: 3+ leu/uL Mucous: Not Present      Epithelial Cells: 0 - 5/hpf      Yeast: NS (Not Seen)      Sperm: Not Present    Notes: Renal Tubular epithleium present    ASSESSMENT:      ICD-10 Details  1 GU:   Chronic cystitis (w/o hematuria) - N30.20   2   Urge incontinence - N39.41      PLAN:           Orders Labs Urine Culture          Schedule Return Visit/Planned Activity: Keep Scheduled Appointment - Office Visit          Document Letter(s):  Created for Patient: Clinical Summary         Notes:   I went through the Botox injection procedure  with the patient in detail and answered all her questions. I sent a urine culture today to ensure that she does not have any infections prior to her surgery. All questions were answered, and she is opted to proceed with the operation as scheduled.

## 2022-02-13 NOTE — Interval H&P Note (Signed)
History and Physical Interval Note:  02/13/2022 7:29 AM  Kimberly Lin  has presented today for surgery, with the diagnosis of URGE CONTINENCE.  The various methods of treatment have been discussed with the patient and family. After consideration of risks, benefits and other options for treatment, the patient has consented to  Procedure(s): CYSTOSCOPY WITH BLADDER BOTOX INJECTION (N/A) as a surgical intervention.  The patient's history has been reviewed, patient examined, no change in status, stable for surgery.  I have reviewed the patient's chart and labs.  Questions were answered to the patient's satisfaction.     Ardis Hughs

## 2022-02-13 NOTE — Anesthesia Postprocedure Evaluation (Signed)
Anesthesia Post Note  Patient: Kimberly Lin  Procedure(s) Performed: CYSTOSCOPY WITH BLADDER BOTOX INJECTION (Vagina )     Patient location during evaluation: PACU Anesthesia Type: General Level of consciousness: awake and alert Pain management: pain level controlled Vital Signs Assessment: post-procedure vital signs reviewed and stable Respiratory status: spontaneous breathing, nonlabored ventilation and respiratory function stable Cardiovascular status: stable and blood pressure returned to baseline Anesthetic complications: no   No notable events documented.  Last Vitals:  Vitals:   02/13/22 0853 02/13/22 0915  BP: 127/65 (!) 135/58  Pulse: 67 65  Resp: 19 15  Temp:  36.4 C  SpO2: 94% 94%    Last Pain:  Vitals:   02/13/22 0915  TempSrc:   PainSc: 0-No pain                 Audry Pili

## 2022-02-13 NOTE — Op Note (Signed)
Preoperative diagnosis:  Urge incontinence   Postoperative diagnosis:  Same  Procedure: Cystoscopy Detrusor Botox injection, 200 units  Surgeon: Ardis Hughs, MD  Anesthesia: General  Complications: None  Intraoperative findings:  1: The patient had some mild prolapse of the bladder.  Otherwise, the bladder mucosa was normal-appearing with orthotopic ureteral orifice ease. 2: 20 areas were injected along the trigone and posterior bladder wall with 1 mL of a 200 units/10 mL Botox formula.  EBL: Minimal  Specimens: None  Indication: Kimberly Lin is a 77 y.o. patient with refractory urgency associated incontinence.  After reviewing the management options for treatment, he elected to proceed with the above surgical procedure(s). We have discussed the potential benefits and risks of the procedure, side effects of the proposed treatment, the likelihood of the patient achieving the goals of the procedure, and any potential problems that might occur during the procedure or recuperation. Informed consent has been obtained.  Description of procedure:  The patient was taken to the operating room and general anesthesia was induced.  The patient was placed in the dorsal lithotomy position, prepped and draped in the usual sterile fashion, and preoperative antibiotics were administered. A preoperative time-out was performed.   Using a 0 degree lens a 21 French cystoscope by gently passed into the patient's urethra and into the bladder.  I performed cystoscopy with the above findings.  I then injected 20 separate sites along the trigone and posterior bladder wall with Botox.  I then emptied the patient's bladder.  The case was subsequently terminated.  Disposition: The patient is being discharged home from the PACU.  Ardis Hughs, M.D.

## 2022-02-13 NOTE — Discharge Instructions (Addendum)
CYSTOSCOPY HOME CARE INSTRUCTIONS  Activity: Rest for the remainder of the day.  Do not drive or operate equipment today.  You may resume normal activities in one to two days as instructed by your physician.   Meals: Drink plenty of liquids and eat light foods such as gelatin or soup this evening.  You may return to a normal meal plan tomorrow.  Return to Work: You may return to work in one to two days or as instructed by your physician.  Special Instructions / Symptoms: Call your physician if any of these symptoms occur:   -persistent or heavy bleeding  -bleeding which continues after first few urination  -large blood clots that are difficult to pass  -urine stream diminishes or stops completely  -fever equal to or higher than 101 degrees Farenheit.  -cloudy urine with a strong, foul odor  -severe pain  Females should always wipe from front to back after elimination.  You may feel some burning pain when you urinate.  This should disappear with time.  Applying moist heat to the lower abdomen or a hot tub bath may help relieve the pain. \  Follow-Up / Date of Return Visit to Your Physician:  Jan 18th at 1:30.   Post Anesthesia Home Care Instructions  Activity: Get plenty of rest for the remainder of the day. A responsible individual must stay with you for 24 hours following the procedure.  For the next 24 hours, DO NOT: -Drive a car -Paediatric nurse -Drink alcoholic beverages -Take any medication unless instructed by your physician -Make any legal decisions or sign important papers.  Meals: Start with liquid foods such as gelatin or soup. Progress to regular foods as tolerated. Avoid greasy, spicy, heavy foods. If nausea and/or vomiting occur, drink only clear liquids until the nausea and/or vomiting subsides. Call your physician if vomiting continues.  Special Instructions/Symptoms: Your throat may feel dry or sore from the anesthesia or the breathing tube placed in your  throat during surgery. If this causes discomfort, gargle with warm salt water. The discomfort should disappear within 24 hours.  No acetaminophen/Tylenol until after 12:15 pm today if needed.

## 2022-02-13 NOTE — Anesthesia Procedure Notes (Signed)
Procedure Name: LMA Insertion Date/Time: 02/13/2022 7:50 AM  Performed by: Justice Rocher, CRNAPre-anesthesia Checklist: Patient identified, Emergency Drugs available, Suction available, Patient being monitored and Timeout performed Patient Re-evaluated:Patient Re-evaluated prior to induction Oxygen Delivery Method: Circle system utilized Preoxygenation: Pre-oxygenation with 100% oxygen Induction Type: IV induction Ventilation: Mask ventilation without difficulty LMA: LMA inserted LMA Size: 4.0 Number of attempts: 1 Airway Equipment and Method: Bite block Placement Confirmation: positive ETCO2, breath sounds checked- equal and bilateral and CO2 detector Tube secured with: Tape Dental Injury: Teeth and Oropharynx as per pre-operative assessment

## 2022-02-13 NOTE — Anesthesia Postprocedure Evaluation (Signed)
Anesthesia Post Note  Patient: Kimberly Lin  Procedure(s) Performed: CYSTOSCOPY WITH BLADDER BOTOX INJECTION (Vagina )     Anesthesia Type: General Anesthetic complications: no   No notable events documented.  Last Vitals:  Vitals:   02/13/22 0637  BP: (!) 176/76  Pulse: 64  Resp: 18  Temp: 36.5 C  SpO2: 95%    Last Pain:  Vitals:   02/13/22 0637  TempSrc: Oral  PainSc: 9                  Kailany Dinunzio

## 2022-02-14 ENCOUNTER — Encounter (HOSPITAL_BASED_OUTPATIENT_CLINIC_OR_DEPARTMENT_OTHER): Payer: Self-pay | Admitting: Urology

## 2022-05-26 ENCOUNTER — Encounter: Payer: Self-pay | Admitting: *Deleted

## 2022-11-06 ENCOUNTER — Other Ambulatory Visit (HOSPITAL_BASED_OUTPATIENT_CLINIC_OR_DEPARTMENT_OTHER): Payer: Self-pay | Admitting: Family Medicine

## 2022-11-06 DIAGNOSIS — Z1231 Encounter for screening mammogram for malignant neoplasm of breast: Secondary | ICD-10-CM

## 2022-12-08 ENCOUNTER — Ambulatory Visit (HOSPITAL_BASED_OUTPATIENT_CLINIC_OR_DEPARTMENT_OTHER)
Admission: RE | Admit: 2022-12-08 | Discharge: 2022-12-08 | Disposition: A | Discharge status: Home / Self Care | Payer: Medicare Other | Source: Ambulatory Visit | Attending: Family Medicine | Admitting: Family Medicine

## 2022-12-08 ENCOUNTER — Encounter (HOSPITAL_BASED_OUTPATIENT_CLINIC_OR_DEPARTMENT_OTHER): Payer: Self-pay

## 2022-12-08 DIAGNOSIS — Z1231 Encounter for screening mammogram for malignant neoplasm of breast: Secondary | ICD-10-CM | POA: Insufficient documentation

## 2022-12-11 ENCOUNTER — Other Ambulatory Visit: Payer: Self-pay | Admitting: Family Medicine

## 2022-12-11 DIAGNOSIS — R928 Other abnormal and inconclusive findings on diagnostic imaging of breast: Secondary | ICD-10-CM

## 2022-12-18 ENCOUNTER — Other Ambulatory Visit: Payer: Self-pay | Admitting: Family Medicine

## 2022-12-18 ENCOUNTER — Ambulatory Visit
Admission: RE | Admit: 2022-12-18 | Discharge: 2022-12-18 | Disposition: A | Discharge status: Home / Self Care | Payer: Medicare Other | Source: Ambulatory Visit | Attending: Family Medicine | Admitting: Family Medicine

## 2022-12-18 DIAGNOSIS — N6489 Other specified disorders of breast: Secondary | ICD-10-CM

## 2022-12-18 DIAGNOSIS — R928 Other abnormal and inconclusive findings on diagnostic imaging of breast: Secondary | ICD-10-CM

## 2022-12-24 ENCOUNTER — Other Ambulatory Visit: Payer: Self-pay | Admitting: Family Medicine

## 2022-12-24 DIAGNOSIS — N6489 Other specified disorders of breast: Secondary | ICD-10-CM

## 2022-12-29 ENCOUNTER — Other Ambulatory Visit: Payer: Self-pay | Admitting: Family Medicine

## 2022-12-29 ENCOUNTER — Other Ambulatory Visit: Payer: Medicare Other

## 2022-12-29 ENCOUNTER — Ambulatory Visit
Admission: RE | Admit: 2022-12-29 | Discharge: 2022-12-29 | Disposition: A | Discharge status: Home / Self Care | Payer: Medicare Other | Source: Ambulatory Visit | Attending: Family Medicine | Admitting: Family Medicine

## 2022-12-29 DIAGNOSIS — N6489 Other specified disorders of breast: Secondary | ICD-10-CM

## 2022-12-29 HISTORY — PX: BREAST BIOPSY: SHX20

## 2022-12-30 LAB — SURGICAL PATHOLOGY

## 2023-06-15 ENCOUNTER — Other Ambulatory Visit: Payer: Self-pay | Admitting: General Surgery

## 2023-06-15 DIAGNOSIS — N6489 Other specified disorders of breast: Secondary | ICD-10-CM

## 2023-06-19 ENCOUNTER — Ambulatory Visit
Admission: RE | Admit: 2023-06-19 | Discharge: 2023-06-19 | Disposition: A | Discharge status: Home / Self Care | Source: Ambulatory Visit | Attending: Family Medicine | Admitting: Family Medicine

## 2023-06-19 DIAGNOSIS — N6489 Other specified disorders of breast: Secondary | ICD-10-CM

## 2023-11-10 ENCOUNTER — Other Ambulatory Visit: Payer: Self-pay | Admitting: General Surgery

## 2023-11-10 DIAGNOSIS — N6489 Other specified disorders of breast: Secondary | ICD-10-CM

## 2023-12-09 ENCOUNTER — Ambulatory Visit
Admission: RE | Admit: 2023-12-09 | Discharge: 2023-12-09 | Disposition: A | Source: Ambulatory Visit | Attending: General Surgery | Admitting: General Surgery

## 2023-12-09 ENCOUNTER — Other Ambulatory Visit: Payer: Self-pay | Admitting: General Surgery

## 2023-12-09 DIAGNOSIS — N6310 Unspecified lump in the right breast, unspecified quadrant: Secondary | ICD-10-CM

## 2023-12-09 DIAGNOSIS — N6489 Other specified disorders of breast: Secondary | ICD-10-CM

## 2024-03-06 NOTE — Progress Notes (Unsigned)
 "   [facility] Telephone:([phone]) [phone]   Fax:([phone]) [phone]  CONSULT NOTE  REFERRING PHYSICIAN: Heron Fear PA-C   REASON FOR CONSULTATION:  Leukocytosis  HPI Kimberly Lin is a 79 y.o. female with a past medical history significant for hypertension, migraines, sleep apnea, GERD, fatty liver, diabetes, rhabdomyolysis, fibromyalgia, hyperlipidemia, depression, and***is referred to the clinic for leukocytosis.  She was referred by [facility] medical. She was seen on 01/26/24 for follow up of her diabetes. Of note, her chart review she has been reported to have chronic UTIs. She also was reported to have a new painful skin lesion and a rash under her left breast.   Her WBC in April 2025 showed WBC 12.2 She also had positive urinalysis.   **Colitis???  ***Obesity  The oldest labs available to me are from 2014. She has intermittent periods of leukocytosis which later improve.    The patient states she has had leukocytosis since ~***. ***Sleep apnea  Overall, the patient is feeling *** today.  She denies any recent illnesses.  She denies any fever, chills, night sweats, or lymphadenopathy.  *** She denies any other recent nasal congestion, allergies, sore throat, cough, skin infections, dysuria, skin rash or diarrhea. She denies any arthritis, unexplained weight loss, and bleeding or bruising. ***Abdominal pain. ***Steroid inhaler. She denies any steroid or hormone use. She denies any drug, environmental, or food allergies.  She denies any other recent surgeries.   ***Smoking   HPI  Past Medical History:  Diagnosis Date   Abdominal mass    Abnormal nuclear stress test 07/05/2014   Acute encephalopathy 03/26/2016   Arthritis    osteoarthritis   Colitis 2014   Diabetes mellitus    Fibromyalgia    GERD (gastroesophageal reflux disease)    Hiatal hernia    History of chicken pox    History of septic shock 12/2009   Hyperlipidemia    Hypertension    Personal  history of colonic polyps    Sepsis secondary to UTI (HCC) 03/26/2016   Spinal stenosis     Past Surgical History:  Procedure Laterality Date   APPENDECTOMY  1983   BREAST BIOPSY Right 2007, 2009   BREAST BIOPSY Left 12/29/2022   MM LT BREAST BX W LOC DEV 1ST LESION IMAGE BX SPEC STEREO GUIDE 12/29/2022 [facility]   CARDIAC CATHETERIZATION N/A 07/05/2014   Procedure: Left Heart Cath and Coronary Angiography;  Surgeon: Ozell Fell, MD;  Location: [facility] INVASIVE CV LAB;  Service: Cardiovascular;  Laterality: N/A;   COLONOSCOPY  2014   normal    CYSTOSCOPY N/A 02/13/2022   Procedure: CYSTOSCOPY WITH BLADDER BOTOX  INJECTION;  Surgeon: Cam Morene ORN, MD;  Location: [facility];  Service: Urology;  Laterality: N/A;   ESOPHAGOGASTRODUODENOSCOPY     in [place]    LUMBAR LAMINECTOMY/DECOMPRESSION MICRODISCECTOMY Left 09/25/2020   Procedure: Laminectomy for facet/synovial cyst - left - Lumbar four-Lumbar five;  Surgeon: Louis Shove, MD;  Location: [facility] OR;  Service: Neurosurgery;  Laterality: Left;   MEDIAL PARTIAL KNEE REPLACEMENT Bilateral 2006   OVARIAN CYST REMOVAL  2008   Pt states she had a ?cyst removed ?ovaries   POLYPECTOMY  1998   RECTAL PROLAPSE REPAIR     XI ROBOT ASSISTED RECTOPEXY N/A 12/30/2019   Procedure: XI ROBOT ASSISTED RECTOPEXY;  Surgeon: Debby Hila, MD;  Location: [facility];  Service: General;  Laterality: N/A;    Family History  Problem Relation Age of Onset   Arthritis Mother  Hyperlipidemia Mother    Hypertension Mother    Diabetes Mother    Heart disease Mother    Breast cancer Mother    Lung cancer Brother    Heart disease Brother        x 2   Hypertension Brother    Hyperlipidemia Brother    Diabetes Brother    Sudden death Father    Lung cancer Father    Rectal cancer Father    Diabetes Sister    Hyperlipidemia Sister    Hypertension Sister    Diabetes Maternal Aunt    Heart attack Maternal Aunt    Hyperlipidemia  Maternal Aunt    Hypertension Maternal Aunt    Hypertension Maternal Uncle    Hyperlipidemia Maternal Uncle    Heart attack Maternal Uncle    Diabetes Maternal Uncle    Diabetes Paternal Aunt    Heart attack Paternal Aunt    Hyperlipidemia Paternal Aunt    Hypertension Paternal Aunt    Hypertension Paternal Uncle    Hyperlipidemia Paternal Uncle    Heart attack Paternal Uncle    Diabetes Paternal Uncle    Prostate cancer Brother    Stomach cancer Neg Hx    Esophageal cancer Neg Hx     Social History Social History[1]  Allergies[2]  Current Outpatient Medications  Medication Sig Dispense Refill   amLODipine  (NORVASC ) 10 MG tablet Take 1 tablet (10 mg total) by mouth daily. 90 tablet 0   aspirin  81 MG tablet Take 81 mg by mouth once a week.     atorvastatin  (LIPITOR) 40 MG tablet Take 1 tablet (40 mg total) by mouth at bedtime.     balsalazide (COLAZAL ) 750 MG capsule Take 2,250 mg by mouth in the morning and at bedtime.     budesonide  (ENTOCORT EC ) 3 MG 24 hr capsule Take 6 mg by mouth every morning.     clobetasol  cream (TEMOVATE ) 0.05 % Apply 1 Application topically 2 (two) times daily. Apply to affected areas 45 g 0   diazepam  (VALIUM ) 10 MG tablet 10 mg See admin instructions. Insert one tablet into the vagina nightly     diphenhydrAMINE  (BENADRYL ) 25 MG tablet Take 50 mg by mouth at bedtime.     lidocaine  (LIDODERM ) 5 % Place 1 patch onto the skin daily as needed (pain). Remove & Discard patch within 12 hours or as directed by MD     metFORMIN  (GLUCOPHAGE ) 500 MG tablet Take 500 mg by mouth 2 (two) times daily with a meal.     metoprolol  succinate (TOPROL -XL) 50 MG 24 hr tablet Take 1 tablet (50 mg total) by mouth daily. (Patient taking differently: Take 50 mg by mouth every morning.)     metoprolol  tartrate (LOPRESSOR ) 25 MG tablet Take 25 mg by mouth every morning.     Multiple Vitamin (MULTIVITAMIN WITH MINERALS) TABS tablet Take 1 tablet by mouth every morning.      naloxone (NARCAN) nasal spray 4 mg/0.1 mL 0.4 mg once as needed (opioid overdose).     omeprazole (PRILOSEC) 40 MG capsule Take 40 mg by mouth at bedtime.     ondansetron  (ZOFRAN -ODT) 4 MG disintegrating tablet Take 4 mg by mouth every 8 (eight) hours as needed for nausea or vomiting.     oxyCODONE -acetaminophen  (PERCOCET) 10-325 MG tablet Take 1 tablet by mouth 3 (three) times daily.     phenazopyridine  (PYRIDIUM ) 200 MG tablet Take 1 tablet (200 mg total) by mouth 3 (three) times daily as needed  for pain. 10 tablet 0   Semaglutide,0.25 or 0.5MG /DOS, (OZEMPIC, 0.25 OR 0.5 MG/DOSE,) 2 MG/1.5ML SOPN Inject 0.5 mg into the skin every Wednesday.      traZODone  (DESYREL ) 50 MG tablet Take 100 mg by mouth at bedtime.     venlafaxine  XR (EFFEXOR -XR) 75 MG 24 hr capsule TAKE TWO CAPSULES BY MOUTH ONCE DAILY (Patient taking differently: Take 150 mg by mouth every morning.) 180 capsule 0   zolpidem  (AMBIEN ) 5 MG tablet Take 10 mg by mouth at bedtime.     No current facility-administered medications for this visit.    REVIEW OF SYSTEMS:   Review of Systems  Constitutional: Negative for appetite change, chills, fatigue, fever and unexpected weight change.  HENT:   Negative for mouth sores, nosebleeds, sore throat and trouble swallowing.   Eyes: Negative for eye problems and icterus.  Respiratory: Negative for cough, hemoptysis, shortness of breath and wheezing.   Cardiovascular: Negative for chest pain and leg swelling.  Gastrointestinal: Negative for abdominal pain, constipation, diarrhea, nausea and vomiting.  Genitourinary: Negative for bladder incontinence, difficulty urinating, dysuria, frequency and hematuria.   Musculoskeletal: Negative for back pain, gait problem, neck pain and neck stiffness.  Skin: Negative for itching and rash.  Neurological: Negative for dizziness, extremity weakness, gait problem, headaches, light-headedness and seizures.  Hematological: Negative for adenopathy. Does not  bruise/bleed easily.  Psychiatric/Behavioral: Negative for confusion, depression and sleep disturbance. The patient is not nervous/anxious.     PHYSICAL EXAMINATION:  There were no vitals taken for this visit.  ECOG PERFORMANCE STATUS: {CHL ONC ECOG [id number]  Physical Exam  Constitutional: Oriented to person, place, and time and well-developed, well-nourished, and in no distress. No distress.  HENT:  Head: Normocephalic and atraumatic.  Mouth/Throat: Oropharynx is clear and moist. No oropharyngeal exudate.  Eyes: Conjunctivae are normal. Right eye exhibits no discharge. Left eye exhibits no discharge. No scleral icterus.  Neck: Normal range of motion. Neck supple.  Cardiovascular: Normal rate, regular rhythm, normal heart sounds and intact distal pulses.   Pulmonary/Chest: Effort normal and breath sounds normal. No respiratory distress. No wheezes. No rales.  Abdominal: Soft. Bowel sounds are normal. Exhibits no distension and no mass. There is no tenderness.  Musculoskeletal: Normal range of motion. Exhibits no edema.  Lymphadenopathy:    No cervical adenopathy.  Neurological: Alert and oriented to person, place, and time. Exhibits normal muscle tone. Gait normal. Coordination normal.  Skin: Skin is warm and dry. No rash noted. Not diaphoretic. No erythema. No pallor.  Psychiatric: Mood, memory and judgment normal.  Vitals reviewed.  LABORATORY DATA: Lab Results  Component Value Date   WBC 7.5 04/23/2021   HGB 15.0 02/13/2022   HCT 44.0 02/13/2022   MCV 88.3 04/23/2021   PLT 160 04/23/2021      Chemistry      Component Value Date/Time   NA 139 02/13/2022 0629   K 4.1 02/13/2022 0629   CL 101 02/13/2022 0629   CO2 22 04/22/2021 0354   BUN 18 02/13/2022 0629   CREATININE 0.60 02/13/2022 0629   CREATININE 0.82 12/26/2014 1001      Component Value Date/Time   CALCIUM  8.2 (L) 04/22/2021 0354   ALKPHOS 41 04/21/2021 0453   AST 63 (H) 04/21/2021 0453   ALT 22  04/21/2021 0453   BILITOT 0.3 04/21/2021 0453       RADIOGRAPHIC STUDIES: No results found.  ASSESSMENT: This is a very pleasant 79 year old Caucasian female referred to clinic for leukocytosis.  The patient was seen Dr. Sherrod today.  Dr. Sherrod feels this is likely reactive in nature given multiple potential causes such as obesity, sleep apnea, and chronic UTIs.  However we will arrange for JAK2 and BCR-ABL to rule out any myeloproliferative process.  If this is negative then no further workup is needed and likely needs to manage the underlying cause of her leukocytosis such as management with possible urology due to chronic UTIs or with her PCP.  I will call the patient with the results.  The patient voices understanding of current disease status and treatment options and is in agreement with the current care plan.  All questions were answered. The patient knows to call the clinic with any problems, questions or concerns. We can certainly see the patient much sooner if necessary.  Thank you so much for allowing me to participate in the care of Kimberly Lin. I will continue to follow up the patient with you and assist in her care.  I spent {CHL ONC TIME VISIT - DTPQU:[id number]} counseling the patient face to face. The total time spent in the appointment was {CHL ONC TIME VISIT - DTPQU:[id number]}.  Disclaimer: This note was dictated with voice recognition software. Similar sounding words can inadvertently be transcribed and may not be corrected upon review.   Ezri Landers L Ousmane Seeman March 06, 2024, 9:47 AM       [1]  Social History Tobacco Use   Smoking status: Former    Types: Cigarettes   Smokeless tobacco: Never   Tobacco comments:    Smoked socially.  Vaping Use   Vaping status: Never Used  Substance Use Topics   Alcohol use: Not Currently    Comment: Rare, occasional use   Drug use: No  [2]  Allergies Allergen Reactions   Erythromycin Swelling     all mycins   Losartan  Other (See Comments)    hyperkalemia   Prednisone Other (See Comments)    Insomnia and makes me crazy   "

## 2024-03-08 ENCOUNTER — Inpatient Hospital Stay

## 2024-03-08 ENCOUNTER — Inpatient Hospital Stay: Admitting: Physician Assistant

## 2024-03-16 ENCOUNTER — Inpatient Hospital Stay: Admitting: Oncology

## 2024-03-16 ENCOUNTER — Inpatient Hospital Stay

## 2024-03-16 VITALS — BP 107/65 | HR 80 | Temp 97.6°F | Resp 17 | Ht 63.0 in | Wt 174.4 lb

## 2024-03-16 DIAGNOSIS — D72829 Elevated white blood cell count, unspecified: Secondary | ICD-10-CM

## 2024-03-16 LAB — SEDIMENTATION RATE: Sed Rate: 24 mm/h — ABNORMAL HIGH (ref 0–22)

## 2024-03-16 LAB — C-REACTIVE PROTEIN: CRP: <0.5 mg/dL

## 2024-03-16 LAB — CBC WITH DIFFERENTIAL (CANCER CENTER ONLY)
Abs Immature Granulocytes: 0.03 10*3/uL (ref 0.00–0.07)
Basophils Absolute: 0.1 10*3/uL (ref 0.0–0.1)
Basophils Relative: 1 %
Eosinophils Absolute: 0.8 10*3/uL — ABNORMAL HIGH (ref 0.0–0.5)
Eosinophils Relative: 7 %
HCT: 42 % (ref 36.0–46.0)
Hemoglobin: 14.5 g/dL (ref 12.0–15.0)
Immature Granulocytes: 0 %
Lymphocytes Relative: 21 %
Lymphs Abs: 2.4 10*3/uL (ref 0.7–4.0)
MCH: 29.5 pg (ref 26.0–34.0)
MCHC: 34.5 g/dL (ref 30.0–36.0)
MCV: 85.4 fL (ref 80.0–100.0)
Monocytes Absolute: 0.7 10*3/uL (ref 0.1–1.0)
Monocytes Relative: 6 %
Neutro Abs: 7.2 10*3/uL (ref 1.7–7.7)
Neutrophils Relative %: 65 %
Platelet Count: 260 10*3/uL (ref 150–400)
RBC: 4.92 MIL/uL (ref 3.87–5.11)
RDW: 13.2 % (ref 11.5–15.5)
WBC Count: 11.2 10*3/uL — ABNORMAL HIGH (ref 4.0–10.5)
nRBC: 0 % (ref 0.0–0.2)

## 2024-03-16 LAB — CMP (CANCER CENTER ONLY)
ALT: 15 U/L (ref 0–44)
AST: 21 U/L (ref 15–41)
Albumin: 4.2 g/dL (ref 3.5–5.0)
Alkaline Phosphatase: 69 U/L (ref 38–126)
Anion gap: 10 (ref 5–15)
BUN: 20 mg/dL (ref 8–23)
CO2: 28 mmol/L (ref 22–32)
Calcium: 9.4 mg/dL (ref 8.9–10.3)
Chloride: 99 mmol/L (ref 98–111)
Creatinine: 1.12 mg/dL — ABNORMAL HIGH (ref 0.44–1.00)
GFR, Estimated: 50 mL/min — ABNORMAL LOW
Glucose, Bld: 136 mg/dL — ABNORMAL HIGH (ref 70–99)
Potassium: 5.1 mmol/L (ref 3.5–5.1)
Sodium: 137 mmol/L (ref 135–145)
Total Bilirubin: 0.3 mg/dL (ref 0.0–1.2)
Total Protein: 7.1 g/dL (ref 6.5–8.1)

## 2024-03-16 LAB — LACTATE DEHYDROGENASE: LDH: 184 U/L (ref 105–235)

## 2024-03-16 NOTE — Progress Notes (Unsigned)
 "  Kimberly Lin  HEMATOLOGY CLINIC CONSULTATION NOTE    PATIENT NAME: Kimberly Lin   MR#: [id number] DOB: 05-06-1945  DATE OF SERVICE: 03/16/2024  REFERRING PROVIDER:  Jerome Heron Ruth, PA-C   Patient Care Team: Kimberly Lin Kimberly Lin as PCP - General (Physician Assistant) Kimberly Morene ORN, MD as Attending Physician (Urology)  REASON FOR CONSULTATION/ CHIEF COMPLAINT:  Evaluation of leukocytosis.   ASSESSMENT & PLAN:  Caris B Kimberly Lin is a 79 y.o. lady with a past medical history of hypertension, dyslipidemia, type 2 diabetes mellitus, chronic UTIs, anxiety/depression, was referred to our service for evaluation of leukocytosis.    Leukocytosis On 01/26/2024, labs at her PCPs office showed white count of 12,200 with normal differential overall.  Hemoglobin 15.9, hematocrit 49.5, MCV 91.  Platelet count normal at 264,000.  She was referred to us  for evaluation of leukocytosis.  TSH was normal.  B12 and folate were also within normal limits.  Persistent leukocytosis for at least one year, most likely secondary to chronic urinary tract infections given ongoing symptoms and absence of constitutional signs.   Differential includes infection (most likely UTI), inflammation, and bone marrow disorders such as chronic leukemia.   No evidence of acute leukemia or marrow failure, but further evaluation is indicated to exclude hematologic malignancy or other etiologies.  - Ordered CBC, chemistries, ESR, and CRP to assess for infection, inflammation, and BCR/ABL 1 to rule out any myeloproliferative neoplasm.  -Labs today showed stable white count of 11,200 with normal differential, mildly eosinophilia with absolute eosinophil count of 800.  Normal hemoglobin and platelet count.  LDH normal.  -We will arrange for a phone call visit in two weeks to review laboratory results.  - Scheduled follow-up in four months for repeat laboratory evaluation.  - If all workup  is negative and leukocytosis remains unchanged, plan to discharge from hematology after next visit.    I reviewed lab results and outside records for this visit and discussed relevant results with the patient. Diagnosis, plan of care and treatment options were also discussed in detail with the patient. Opportunity provided to ask questions and answers provided to her apparent satisfaction. Provided instructions to call our clinic with any problems, questions or concerns prior to return visit. I recommended to continue follow-up with PCP and sub-specialists. She verbalized understanding and agreed with the plan. No barriers to learning was detected.  Kimberly Patten, MD  03/16/2024 2:21 PM  [facility] - A DEPT OF [facility] [address] [place] [address] Dept: [phone] Dept Fax: [phone]   HISTORY OF PRESENTING ILLNESS:   Discussed the use of AI scribe software for clinical note transcription with the patient, who gave verbal consent to proceed.  History of Present Illness Kimberly Lin is a 79 year old female with persistent leukocytosis who presents for evaluation of elevated white blood cell counts.  On 01/26/2024, labs at her PCPs office showed white count of 12,200 with normal differential overall.  Hemoglobin 15.9, hematocrit 49.5, MCV 91.  Platelet count normal at 264,000.  She was referred to us  for evaluation of leukocytosis.  TSH was normal.  B12 and folate were also within normal limits.  She has had leukocytosis noted on two consecutive laboratory assessments, persisting for at least one year. She denies constitutional symptoms including fever, chills, night sweats, or unintentional weight loss. Appetite remains intact.  She has chronic urinary tract infections, currently symptomatic,  with recurrent episodes over the past year. She was evaluated the previous Friday but did not receive antibiotics.  She previously underwent placement of a [employer] device in her back at [facility] approximately one year ago for urinary symptoms and colitis, but reports no benefit; the device remains in place.  She carries a diagnosis of colitis and has received care from both gastroenterology and urology for urinary and gastrointestinal symptoms.      MEDICAL HISTORY:  Past Medical History:  Diagnosis Date   Abdominal mass    Abnormal nuclear stress test 07/05/2014   Acute encephalopathy 03/26/2016   Arthritis    osteoarthritis   Colitis 2014   Diabetes mellitus    Fibromyalgia    GERD (gastroesophageal reflux disease)    Hiatal hernia    History of chicken pox    History of septic shock 12/2009   Hyperlipidemia    Hypertension    Personal history of colonic polyps    Sepsis secondary to UTI (HCC) 03/26/2016   Spinal stenosis     SURGICAL HISTORY: Past Surgical History:  Procedure Laterality Date   APPENDECTOMY  1983   BREAST BIOPSY Right 2007, 2009   BREAST BIOPSY Left 12/29/2022   MM LT BREAST BX W LOC DEV 1ST LESION IMAGE BX SPEC STEREO GUIDE 12/29/2022 [facility]   CARDIAC CATHETERIZATION N/A 07/05/2014   Procedure: Left Heart Cath and Coronary Angiography;  Surgeon: Ozell Fell, MD;  Location: [facility] INVASIVE CV LAB;  Service: Cardiovascular;  Laterality: N/A;   COLONOSCOPY  2014   normal    CYSTOSCOPY N/A 02/13/2022   Procedure: CYSTOSCOPY WITH BLADDER BOTOX  INJECTION;  Surgeon: Kimberly Morene ORN, MD;  Location: [facility];  Service: Urology;  Laterality: N/A;   ESOPHAGOGASTRODUODENOSCOPY     in [place]    LUMBAR LAMINECTOMY/DECOMPRESSION MICRODISCECTOMY Left 09/25/2020   Procedure: Laminectomy for facet/synovial cyst - left - Lumbar four-Lumbar five;  Surgeon: Louis Shove, MD;  Location: [facility];  Service: Neurosurgery;  Laterality: Left;   MEDIAL PARTIAL KNEE REPLACEMENT Bilateral 2006   OVARIAN CYST REMOVAL  2008   Pt states she had a ?cyst  removed ?ovaries   POLYPECTOMY  1998   RECTAL PROLAPSE REPAIR     XI ROBOT ASSISTED RECTOPEXY N/A 12/30/2019   Procedure: XI ROBOT ASSISTED RECTOPEXY;  Surgeon: Debby Hila, MD;  Location: [facility];  Service: General;  Laterality: N/A;    SOCIAL HISTORY: She reports that she has quit smoking. Her smoking use included cigarettes. She has never used smokeless tobacco. She reports that she does not currently use alcohol. She reports that she does not use drugs. Social History   Socioeconomic History   Marital status: Married    Spouse name: Not on file   Number of children: 2   Years of education: Not on file   Highest education level: Not on file  Occupational History   Not on file  Tobacco Use   Smoking status: Former    Types: Cigarettes   Smokeless tobacco: Never   Tobacco comments:    Smoked socially.  Vaping Use   Vaping status: Never Used  Substance and Sexual Activity   Alcohol use: Not Currently    Comment: Rare, occasional use   Drug use: No   Sexual activity: Not on file  Other Topics Concern   Not on file  Social History Narrative   Regular exercise:  No   Caffeine use: 1 cup daily  Social Drivers of Health   Tobacco Use: Medium Risk (12/09/2023)   Patient History    Smoking Tobacco Use: Former    Smokeless Tobacco Use: Never    Passive Exposure: Not on Actuary Strain: Not on file  Food Insecurity: Not on file  Transportation Needs: Not on file  Physical Activity: Not on file  Stress: Not on file  Social Connections: Not on file  Intimate Partner Violence: Not on file  Depression (EYV7-0): Not on file  Alcohol Screen: Not on file  Housing: Unknown (02/26/2023)   Received from [facility]   Epic    Unable to Pay for Housing in the Last Year: Not on file    Number of Times Moved in the Last Year: Not on file    At any time in the past 12 months, were you homeless or living in a shelter (including now)?: No   Utilities: Not on file  Health Literacy: Not on file    FAMILY HISTORY: Family History  Problem Relation Age of Onset   Arthritis Mother    Hyperlipidemia Mother    Hypertension Mother    Diabetes Mother    Heart disease Mother    Breast cancer Mother    Lung cancer Brother    Heart disease Brother        x 2   Hypertension Brother    Hyperlipidemia Brother    Diabetes Brother    Sudden death Father    Lung cancer Father    Rectal cancer Father    Diabetes Sister    Hyperlipidemia Sister    Hypertension Sister    Diabetes Maternal Aunt    Heart attack Maternal Aunt    Hyperlipidemia Maternal Aunt    Hypertension Maternal Aunt    Hypertension Maternal Uncle    Hyperlipidemia Maternal Uncle    Heart attack Maternal Uncle    Diabetes Maternal Uncle    Diabetes Paternal Aunt    Heart attack Paternal Aunt    Hyperlipidemia Paternal Aunt    Hypertension Paternal Aunt    Hypertension Paternal Uncle    Hyperlipidemia Paternal Uncle    Heart attack Paternal Uncle    Diabetes Paternal Uncle    Prostate cancer Brother    Stomach cancer Neg Hx    Esophageal cancer Neg Hx     ALLERGIES:  She is allergic to erythromycin, losartan , and prednisone.  MEDICATIONS:  Current Outpatient Medications  Medication Sig Dispense Refill   amLODipine  (NORVASC ) 10 MG tablet Take 1 tablet (10 mg total) by mouth daily. 90 tablet 0   aspirin  81 MG tablet Take 81 mg by mouth once a week.     atorvastatin  (LIPITOR) 40 MG tablet Take 1 tablet (40 mg total) by mouth at bedtime.     balsalazide (COLAZAL ) 750 MG capsule Take 2,250 mg by mouth in the morning and at bedtime.     budesonide  (ENTOCORT EC ) 3 MG 24 hr capsule Take 6 mg by mouth every morning.     clobetasol  cream (TEMOVATE ) 0.05 % Apply 1 Application topically 2 (two) times daily. Apply to affected areas 45 g 0   diazepam  (VALIUM ) 10 MG tablet 10 mg See admin instructions. Insert one tablet into the vagina nightly     diphenhydrAMINE   (BENADRYL ) 25 MG tablet Take 50 mg by mouth at bedtime.     lidocaine  (LIDODERM ) 5 % Place 1 patch onto the skin daily as needed (pain). Remove & Discard patch  within 12 hours or as directed by MD     metFORMIN  (GLUCOPHAGE ) 500 MG tablet Take 500 mg by mouth 2 (two) times daily with a meal.     metoprolol  succinate (TOPROL -XL) 50 MG 24 hr tablet Take 1 tablet (50 mg total) by mouth daily. (Patient taking differently: Take 50 mg by mouth every morning.)     metoprolol  tartrate (LOPRESSOR ) 25 MG tablet Take 25 mg by mouth every morning.     Multiple Vitamin (MULTIVITAMIN WITH MINERALS) TABS tablet Take 1 tablet by mouth every morning.     naloxone (NARCAN) nasal spray 4 mg/0.1 mL 0.4 mg once as needed (opioid overdose).     omeprazole (PRILOSEC) 40 MG capsule Take 40 mg by mouth at bedtime.     ondansetron  (ZOFRAN -ODT) 4 MG disintegrating tablet Take 4 mg by mouth every 8 (eight) hours as needed for nausea or vomiting.     oxyCODONE -acetaminophen  (PERCOCET) 10-325 MG tablet Take 1 tablet by mouth 3 (three) times daily.     phenazopyridine  (PYRIDIUM ) 200 MG tablet Take 1 tablet (200 mg total) by mouth 3 (three) times daily as needed for pain. 10 tablet 0   Semaglutide,0.25 or 0.5MG /DOS, (OZEMPIC, 0.25 OR 0.5 MG/DOSE,) 2 MG/1.5ML SOPN Inject 0.5 mg into the skin every Wednesday.      traZODone  (DESYREL ) 50 MG tablet Take 100 mg by mouth at bedtime.     venlafaxine  XR (EFFEXOR -XR) 75 MG 24 hr capsule TAKE TWO CAPSULES BY MOUTH ONCE DAILY (Patient taking differently: Take 150 mg by mouth every morning.) 180 capsule 0   zolpidem  (AMBIEN ) 5 MG tablet Take 10 mg by mouth at bedtime.     No current facility-administered medications for this visit.    REVIEW OF SYSTEMS:    Review of Systems - Oncology  All other pertinent systems were reviewed and were negative except as mentioned above.  PHYSICAL EXAMINATION:    Vitals:   03/16/24 1456  BP: 107/65  Pulse: 80  Resp: 17  Temp: 97.6 F (36.4 C)   SpO2: 95%   Filed Weights   03/16/24 1456  Weight: 174 lb 6.4 oz (79.1 kg)    Physical Exam Constitutional:      General: She is not in acute distress.    Appearance: Normal appearance.  HENT:     Head: Normocephalic and atraumatic.  Cardiovascular:     Rate and Rhythm: Normal rate.     Heart sounds: Normal heart sounds.  Pulmonary:     Effort: Pulmonary effort is normal. No respiratory distress.     Breath sounds: Normal breath sounds.  Abdominal:     General: There is no distension.  Neurological:     General: No focal deficit present.     Mental Status: She is alert and oriented to person, place, and time.  Psychiatric:        Mood and Affect: Mood normal.        Behavior: Behavior normal.     LABORATORY DATA:   I have reviewed the data as listed.  Results for orders placed or performed in visit on 03/16/24  C-reactive protein  Result Value Ref Range   CRP <0.5 <1.0 mg/dL  Sedimentation rate  Result Value Ref Range   Sed Rate 24 (H) 0 - 22 mm/hr  Lactate dehydrogenase  Result Value Ref Range   LDH 184 105 - 235 U/L  CMP ([facility] only)  Result Value Ref Range   Sodium 137 135 - 145 mmol/L  Potassium 5.1 3.5 - 5.1 mmol/L   Chloride 99 98 - 111 mmol/L   CO2 28 22 - 32 mmol/L   Glucose, Bld 136 (H) 70 - 99 mg/dL   BUN 20 8 - 23 mg/dL   Creatinine 8.87 (H) 9.55 - 1.00 mg/dL   Calcium  9.4 8.9 - 10.3 mg/dL   Total Protein 7.1 6.5 - 8.1 g/dL   Albumin  4.2 3.5 - 5.0 g/dL   AST 21 15 - 41 U/L   ALT 15 0 - 44 U/L   Alkaline Phosphatase 69 38 - 126 U/L   Total Bilirubin 0.3 0.0 - 1.2 mg/dL   GFR, Estimated 50 (L) >60 mL/min   Anion gap 10 5 - 15  CBC with Differential ([facility] Only)  Result Value Ref Range   WBC Count 11.2 (H) 4.0 - 10.5 K/uL   RBC 4.92 3.87 - 5.11 MIL/uL   Hemoglobin 14.5 12.0 - 15.0 g/dL   HCT 57.9 63.9 - 53.9 %   MCV 85.4 80.0 - 100.0 fL   MCH 29.5 26.0 - 34.0 pg   MCHC 34.5 30.0 - 36.0 g/dL   RDW 86.7 88.4 - 84.4 %    Platelet Count 260 150 - 400 K/uL   nRBC 0.0 0.0 - 0.2 %   Neutrophils Relative % 65 %   Neutro Abs 7.2 1.7 - 7.7 K/uL   Lymphocytes Relative 21 %   Lymphs Abs 2.4 0.7 - 4.0 K/uL   Monocytes Relative 6 %   Monocytes Absolute 0.7 0.1 - 1.0 K/uL   Eosinophils Relative 7 %   Eosinophils Absolute 0.8 (H) 0.0 - 0.5 K/uL   Basophils Relative 1 %   Basophils Absolute 0.1 0.0 - 0.1 K/uL   Immature Granulocytes 0 %   Abs Immature Granulocytes 0.03 0.00 - 0.07 K/uL    RADIOGRAPHIC STUDIES:  No recent pertinent imaging studies available to review.   I spent a total of 55 minutes during this encounter with the patient including review of chart and various tests results, discussions about plan of care and coordination of care plan.  Orders Placed This Encounter  Procedures   CBC with Differential ([facility] Only)    Standing Status:   Future    Number of Occurrences:   1    Expiration Date:   03/16/2025   CMP (Cancer [facility] only)    Standing Status:   Future    Number of Occurrences:   1    Expiration Date:   03/16/2025   Lactate dehydrogenase    Standing Status:   Future    Number of Occurrences:   1    Expiration Date:   03/16/2025   Sedimentation rate    Standing Status:   Future    Number of Occurrences:   1    Expiration Date:   03/16/2025   C-reactive protein    Standing Status:   Future    Number of Occurrences:   1    Expiration Date:   03/16/2025   BCR-ABL1 FISH    Standing Status:   Future    Number of Occurrences:   1    Expiration Date:   03/16/2025     Future Appointments  Date Time Provider Department [facility]  03/31/2024  9:15 AM Ramiyah Mcclenahan, Chinita, MD [facility] None  07/14/2024 11:00 AM [facility] LAB [facility] None  07/14/2024 11:45 AM Breella Vanostrand, Chinita, MD [facility] None       This document was completed utilizing speech recognition software.  Grammatical errors, random word insertions, pronoun errors, and incomplete sentences are an occasional consequence of this  system due to software limitations, ambient noise, and hardware issues. Any formal questions or concerns about the content, text or information contained within the body of this dictation should be directly addressed to the provider for clarification.   "

## 2024-03-17 ENCOUNTER — Encounter: Payer: Self-pay | Admitting: Oncology

## 2024-03-17 DIAGNOSIS — D72829 Elevated white blood cell count, unspecified: Secondary | ICD-10-CM | POA: Insufficient documentation

## 2024-03-17 NOTE — Assessment & Plan Note (Signed)
 On 01/26/2024, labs at her PCPs office showed white count of 12,200 with normal differential overall.  Hemoglobin 15.9, hematocrit 49.5, MCV 91.  Platelet count normal at 264,000.  She was referred to us  for evaluation of leukocytosis.  TSH was normal.  B12 and folate were also within normal limits.  Persistent leukocytosis for at least one year, most likely secondary to chronic urinary tract infections given ongoing symptoms and absence of constitutional signs.   Differential includes infection (most likely UTI), inflammation, and bone marrow disorders such as chronic leukemia.   No evidence of acute leukemia or marrow failure, but further evaluation is indicated to exclude hematologic malignancy or other etiologies.  - Ordered CBC, chemistries, ESR, and CRP to assess for infection, inflammation, and BCR/ABL 1 to rule out any myeloproliferative neoplasm.  -Labs today showed stable white count of 11,200 with normal differential, mildly eosinophilia with absolute eosinophil count of 800.  Normal hemoglobin and platelet count.  LDH normal.  -We will arrange for a phone call visit in two weeks to review laboratory results.  - Scheduled follow-up in four months for repeat laboratory evaluation.  - If all workup is negative and leukocytosis remains unchanged, plan to discharge from hematology after next visit.

## 2024-03-31 ENCOUNTER — Inpatient Hospital Stay: Admitting: Oncology

## 2024-07-14 ENCOUNTER — Inpatient Hospital Stay

## 2024-07-14 ENCOUNTER — Inpatient Hospital Stay: Admitting: Oncology
